# Patient Record
Sex: Female | Born: 1950
Health system: Southern US, Community
[De-identification: ages and names within clinical notes are randomized; demographics above are authoritative.]

## PROBLEM LIST (undated history)

## (undated) DIAGNOSIS — N3010 Interstitial cystitis (chronic) without hematuria: Secondary | ICD-10-CM

## (undated) DIAGNOSIS — F329 Major depressive disorder, single episode, unspecified: Secondary | ICD-10-CM

## (undated) DIAGNOSIS — R0602 Shortness of breath: Secondary | ICD-10-CM

## (undated) DIAGNOSIS — T8859XA Other complications of anesthesia, initial encounter: Secondary | ICD-10-CM

## (undated) DIAGNOSIS — H538 Other visual disturbances: Secondary | ICD-10-CM

## (undated) DIAGNOSIS — E7800 Pure hypercholesterolemia, unspecified: Secondary | ICD-10-CM

## (undated) DIAGNOSIS — I10 Essential (primary) hypertension: Secondary | ICD-10-CM

## (undated) DIAGNOSIS — M797 Fibromyalgia: Secondary | ICD-10-CM

## (undated) DIAGNOSIS — R42 Dizziness and giddiness: Secondary | ICD-10-CM

## (undated) DIAGNOSIS — F419 Anxiety disorder, unspecified: Secondary | ICD-10-CM

## (undated) DIAGNOSIS — M1990 Unspecified osteoarthritis, unspecified site: Secondary | ICD-10-CM

## (undated) DIAGNOSIS — N390 Urinary tract infection, site not specified: Secondary | ICD-10-CM

## (undated) DIAGNOSIS — G5793 Unspecified mononeuropathy of bilateral lower limbs: Secondary | ICD-10-CM

## (undated) DIAGNOSIS — J189 Pneumonia, unspecified organism: Secondary | ICD-10-CM

## (undated) DIAGNOSIS — G4730 Sleep apnea, unspecified: Secondary | ICD-10-CM

## (undated) DIAGNOSIS — H269 Unspecified cataract: Secondary | ICD-10-CM

## (undated) DIAGNOSIS — Z973 Presence of spectacles and contact lenses: Secondary | ICD-10-CM

## (undated) DIAGNOSIS — M549 Dorsalgia, unspecified: Secondary | ICD-10-CM

## (undated) DIAGNOSIS — M109 Gout, unspecified: Secondary | ICD-10-CM

## (undated) DIAGNOSIS — N399 Disorder of urinary system, unspecified: Secondary | ICD-10-CM

## (undated) DIAGNOSIS — J302 Other seasonal allergic rhinitis: Secondary | ICD-10-CM

## (undated) DIAGNOSIS — F32A Depression, unspecified: Secondary | ICD-10-CM

## (undated) DIAGNOSIS — H9190 Unspecified hearing loss, unspecified ear: Secondary | ICD-10-CM

## (undated) DIAGNOSIS — G629 Polyneuropathy, unspecified: Secondary | ICD-10-CM

## (undated) DIAGNOSIS — B379 Candidiasis, unspecified: Secondary | ICD-10-CM

## (undated) DIAGNOSIS — K219 Gastro-esophageal reflux disease without esophagitis: Secondary | ICD-10-CM

## (undated) HISTORY — PX: LASIK: SHX215

## (undated) HISTORY — PX: OTHER SURGICAL HISTORY: SHX169

## (undated) HISTORY — PX: UPPER GI ENDOSCOPY: SHX6162

## (undated) HISTORY — DX: Sleep apnea, unspecified: G47.30

## (undated) HISTORY — PX: TOE SURGERY: SHX1073

## (undated) HISTORY — PX: EYE SURGERY: SHX253

---

## 1995-01-14 HISTORY — PX: ABDOMINAL HYSTERECTOMY: SHX81

## 2002-09-26 ENCOUNTER — Ambulatory Visit (HOSPITAL_BASED_OUTPATIENT_CLINIC_OR_DEPARTMENT_OTHER): Admission: RE | Admit: 2002-09-26 | Discharge: 2002-09-26 | Payer: Self-pay | Admitting: Urology

## 2002-09-26 ENCOUNTER — Encounter (INDEPENDENT_AMBULATORY_CARE_PROVIDER_SITE_OTHER): Payer: Self-pay | Admitting: Specialist

## 2002-09-26 ENCOUNTER — Ambulatory Visit (HOSPITAL_COMMUNITY): Admission: RE | Admit: 2002-09-26 | Discharge: 2002-09-26 | Payer: Self-pay | Admitting: Urology

## 2003-03-13 ENCOUNTER — Observation Stay (HOSPITAL_COMMUNITY): Admission: RE | Admit: 2003-03-13 | Discharge: 2003-03-14 | Payer: Self-pay | Admitting: Urology

## 2006-04-24 ENCOUNTER — Encounter: Admission: RE | Admit: 2006-04-24 | Discharge: 2006-04-24 | Payer: Self-pay | Admitting: Family Medicine

## 2006-04-24 ENCOUNTER — Encounter (INDEPENDENT_AMBULATORY_CARE_PROVIDER_SITE_OTHER): Payer: Self-pay | Admitting: *Deleted

## 2007-05-14 ENCOUNTER — Encounter: Admission: RE | Admit: 2007-05-14 | Discharge: 2007-05-14 | Payer: Self-pay | Admitting: Family Medicine

## 2008-01-11 ENCOUNTER — Emergency Department (HOSPITAL_COMMUNITY): Admission: EM | Admit: 2008-01-11 | Discharge: 2008-01-11 | Payer: Self-pay | Admitting: Emergency Medicine

## 2008-07-10 ENCOUNTER — Ambulatory Visit (HOSPITAL_COMMUNITY): Admission: RE | Admit: 2008-07-10 | Discharge: 2008-07-10 | Payer: Self-pay | Admitting: Family Medicine

## 2008-11-28 ENCOUNTER — Emergency Department (HOSPITAL_COMMUNITY): Admission: EM | Admit: 2008-11-28 | Discharge: 2008-11-28 | Payer: Self-pay | Admitting: Emergency Medicine

## 2009-06-15 ENCOUNTER — Emergency Department (HOSPITAL_COMMUNITY): Admission: EM | Admit: 2009-06-15 | Discharge: 2009-06-15 | Payer: Self-pay | Admitting: Emergency Medicine

## 2009-06-16 ENCOUNTER — Ambulatory Visit (HOSPITAL_COMMUNITY): Admission: RE | Admit: 2009-06-16 | Discharge: 2009-06-16 | Payer: Self-pay | Admitting: Emergency Medicine

## 2009-07-12 ENCOUNTER — Ambulatory Visit (HOSPITAL_COMMUNITY): Admission: RE | Admit: 2009-07-12 | Discharge: 2009-07-12 | Payer: Self-pay | Admitting: Family Medicine

## 2009-09-06 ENCOUNTER — Emergency Department (HOSPITAL_COMMUNITY): Admission: EM | Admit: 2009-09-06 | Discharge: 2009-09-06 | Payer: Self-pay | Admitting: Emergency Medicine

## 2010-01-28 ENCOUNTER — Emergency Department (HOSPITAL_COMMUNITY)
Admission: EM | Admit: 2010-01-28 | Discharge: 2010-01-28 | Payer: Self-pay | Source: Home / Self Care | Admitting: Emergency Medicine

## 2010-01-30 LAB — URINALYSIS, ROUTINE W REFLEX MICROSCOPIC
Bilirubin Urine: NEGATIVE
Hgb urine dipstick: NEGATIVE
Ketones, ur: NEGATIVE mg/dL
Nitrite: NEGATIVE
Protein, ur: NEGATIVE mg/dL
Specific Gravity, Urine: 1.01 (ref 1.005–1.030)
Urine Glucose, Fasting: NEGATIVE mg/dL
Urobilinogen, UA: 0.2 mg/dL (ref 0.0–1.0)
pH: 6 (ref 5.0–8.0)

## 2010-02-03 ENCOUNTER — Encounter: Payer: Self-pay | Admitting: Family Medicine

## 2010-03-05 ENCOUNTER — Observation Stay (HOSPITAL_COMMUNITY)
Admission: EM | Admit: 2010-03-05 | Discharge: 2010-03-08 | Disposition: A | Discharge status: Home / Self Care | Payer: Medicaid Other | Attending: Family Medicine | Admitting: Family Medicine

## 2010-03-05 ENCOUNTER — Encounter (HOSPITAL_COMMUNITY): Payer: Self-pay | Admitting: Radiology

## 2010-03-05 ENCOUNTER — Emergency Department (HOSPITAL_COMMUNITY): Payer: Medicaid Other

## 2010-03-05 DIAGNOSIS — G8929 Other chronic pain: Secondary | ICD-10-CM | POA: Insufficient documentation

## 2010-03-05 DIAGNOSIS — M549 Dorsalgia, unspecified: Secondary | ICD-10-CM | POA: Insufficient documentation

## 2010-03-05 DIAGNOSIS — IMO0001 Reserved for inherently not codable concepts without codable children: Secondary | ICD-10-CM | POA: Insufficient documentation

## 2010-03-05 DIAGNOSIS — N3010 Interstitial cystitis (chronic) without hematuria: Secondary | ICD-10-CM | POA: Insufficient documentation

## 2010-03-05 DIAGNOSIS — I10 Essential (primary) hypertension: Secondary | ICD-10-CM | POA: Insufficient documentation

## 2010-03-05 DIAGNOSIS — Z79899 Other long term (current) drug therapy: Secondary | ICD-10-CM | POA: Insufficient documentation

## 2010-03-05 DIAGNOSIS — R0789 Other chest pain: Principal | ICD-10-CM | POA: Insufficient documentation

## 2010-03-05 HISTORY — DX: Essential (primary) hypertension: I10

## 2010-03-05 LAB — POCT CARDIAC MARKERS
CKMB, poc: 1.1 ng/mL (ref 1.0–8.0)
Myoglobin, poc: 86 ng/mL (ref 12–200)
Troponin i, poc: <0.05 ng/mL (ref 0.00–0.09)

## 2010-03-05 LAB — CK TOTAL AND CKMB (NOT AT ARMC)
CK, MB: 1.9 ng/mL (ref 0.3–4.0)
Relative Index: 1.1 (ref 0.0–2.5)
Total CK: 167 U/L (ref 7–177)

## 2010-03-05 LAB — BASIC METABOLIC PANEL
BUN: 10 mg/dL (ref 6–23)
CO2: 30 mEq/L (ref 19–32)
Calcium: 9.5 mg/dL (ref 8.4–10.5)
Chloride: 98 mEq/L (ref 96–112)
Creatinine, Ser: 0.76 mg/dL (ref 0.4–1.2)
GFR calc Af Amer: >60 mL/min (ref >60)
GFR calc non Af Amer: >60 mL/min (ref >60)
Glucose, Bld: 87 mg/dL (ref 70–99)
Potassium: 3.9 mEq/L (ref 3.5–5.1)
Sodium: 139 mEq/L (ref 135–145)

## 2010-03-05 LAB — D-DIMER, QUANTITATIVE: D-Dimer, Quant: 0.22 ug/mL-FEU (ref 0.00–0.48)

## 2010-03-05 LAB — DIFFERENTIAL
Basophils Absolute: 0.1 10*3/uL (ref 0.0–0.1)
Basophils Relative: 1 % (ref 0–1)
Eosinophils Absolute: 0.2 10*3/uL (ref 0.0–0.7)
Eosinophils Relative: 4 % (ref 0–5)
Lymphocytes Relative: 34 % (ref 12–46)
Lymphs Abs: 2.1 10*3/uL (ref 0.7–4.0)
Monocytes Absolute: 0.6 10*3/uL (ref 0.1–1.0)
Monocytes Relative: 10 % (ref 3–12)
Neutro Abs: 3.3 10*3/uL (ref 1.7–7.7)
Neutrophils Relative %: 52 % (ref 43–77)

## 2010-03-05 LAB — PROTIME-INR
INR: 0.95 (ref 0.00–1.49)
Prothrombin Time: 12.9 seconds (ref 11.6–15.2)

## 2010-03-05 LAB — CBC
HCT: 39.4 % (ref 36.0–46.0)
Hemoglobin: 13.3 g/dL (ref 12.0–15.0)
MCH: 30.4 pg (ref 26.0–34.0)
MCHC: 33.8 g/dL (ref 30.0–36.0)
MCV: 90.2 fL (ref 78.0–100.0)
Platelets: 227 10*3/uL (ref 150–400)
RBC: 4.37 MIL/uL (ref 3.87–5.11)
RDW: 13.6 % (ref 11.5–15.5)
WBC: 6.3 10*3/uL (ref 4.0–10.5)

## 2010-03-05 LAB — CARDIAC PANEL(CRET KIN+CKTOT+MB+TROPI): Relative Index: 0.9 (ref 0.0–2.5)

## 2010-03-05 LAB — APTT: aPTT: 28 seconds (ref 24–37)

## 2010-03-05 NOTE — H&P (Signed)
NAMELIBNI, FUSARO            ACCOUNT NO.:  [id number]  MEDICAL RECORD NO.:  [id number]           PATIENT TYPE:  I  LOCATION:  A317                          FACILITY:  [facility]  PHYSICIAN:  Andreas Blower, MD       DATE OF BIRTH:  1950-09-10  DATE OF ADMISSION:  03/05/2010 DATE OF DISCHARGE:  LH                             HISTORY & PHYSICAL   PRIMARY CARE PHYSICIAN:  [facility].  The patient has seen Dr. Selinda Flavin at [facility] in [place] in the past.  CHIEF COMPLAINT:  Chest pain.  HISTORY OF PRESENT ILLNESS:  Ms. Darely is a 60 year old Caucasian female with history of hypertension, chronic back pain, fibromyalgia, interstitial cystitis who presents with the above complaint.  She reports that she has been having chest pain on and off over the last month.  She reports that she has been having it every 2 to 3 days. Yesterday, she felt like she was having initially some jaw pain and then heavy pressure-like sensation in her chest.  That lasted for 15 minutes and then resolved.  She has had prior episodes at a mall and has had multiple episodes at home.  As a result, she presented to the ER for further evaluation.  She denies any recent fevers or chills.  Denies any nausea or vomiting.  Does report that she has some shortness of breath at baseline, but is chronic in nature.  Denies any abdominal pain or diarrhea.  Denies any headaches or vision changes.  She does not report anything that triggers these episodes and nothing makes the chest pain better except may be drinking glass of water.  The patient reports that she has gained about 10-20 pounds in the last 3 months or so.  The patient does report that she has been feeling weak and fatigue in the last month.  REVIEW OF SYSTEMS:  All systems were reviewed with the patient was positive as per HPI, otherwise all other systems are negative.  PAST MEDICAL HISTORY: 1. Hypertension. 2. History of chronic back  pain. 3. Fibromyalgia. 4. History of cholelithiasis. 5. History of interstitial cystitis. 6. History of hysterectomy.  The patient was recently treated for pneumonia in January 2012.  SOCIAL HISTORY:  The patient does not smoke, does not use any illegal drugs.  Does drink one glass of wine approximately every 3 months.  FAMILY HISTORY:  Significant for mother having hypertension.  Has had heart problems.  She is overweight and has diabetes.  Father has had a history of throat cancer and was a smoker and drank alcohol.  Had hypertension.  HOME MEDICATIONS:  Doses need to be accurately rectum reconciled however, the patient is on: 1. Xanax. 2. Clonidine 0.1 mg p.o. twice daily. 3. Toprol extended release 100 mg p.o. daily. 4. Potassium chloride extended release 21 mEq p.o. daily. 5. Clarinex 5 mg p.o. daily. 6. Lisinopril/hydrochlorothiazide 20/12.5 mg p.o. twice daily. 7. Neurontin 400 mg in the morning and 800 mg at night. 8. Cymbalta 60 mg p.o. daily. 9. Flexeril 1 tablet p.o. daily.  PHYSICAL EXAMINATION:  VITAL SIGNS:  Temperature is 99, blood pressure  is 143/66, heart rate 70, respiration 18, satting at 97% on 2 L of oxygen. GENERAL:  The patient was alert and oriented, not appeared to be any acute distress, was lying in bed comfortably. HEENT:  Extraocular muscles are intact.  Pupils equal and round.  Had moist mucous membranes. NECK:  Supple. HEART:  Regular with S1, S2. LUNGS:  Clear to auscultation bilaterally. ABDOMEN:  Soft, nontender, nondistended.  Positive bowel sounds. EXTREMITIES:  The patient with good peripheral pulses with trace edema. NEURO:  Cranial nerves II through XII grossly intact.  Had 5/5 motor strength in upper as well as lower extremities.  RADIOLOGY/IMAGING:  The patient had a portable chest x-ray, which shows no active cardiopulmonary disease.  LABORATORY DATA:  CBC shows white count of 6.3, hemoglobin 13.3, hematocrit 39.4, platelet count  227.  INR 0.95.  D-dimer 0.22. Electrolytes normal with a creatinine of 0.74.  Initial troponin was less than 0.01.  ASSESSMENT AND PLAN: 1. Chest pain.  We will admit the patient and rule her out for     myocardial infarction.  D-dimer is normal indicating low likelihood     for thromboembolic event.  Given that the patient is complaining     about jaw pain and a pressure-like sensation in her chest, given     that the patient's only risk factors are hypertension, we will get a     cardiologist to evaluate her further management per cardiology     after the patient is ruled out for acute coronary syndrome.  The     patient may have pain from gastroesophageal reflux disease     like symptoms as a result, we will start the patient on     pantoprazole at the time of discharge, consider transitioning her     to omeprazole and see if her pain improves on a trial of PPI.     If ruled out for acute coronary syndrome could consider outpatient     GI evaluation for consideration of EGD. 2. Hypertension, not well-controlled at this time.  The patient     however did not take her morning medications.  We will continue her     home medications with p.r.n. hydralazine for blood pressure.     Further titration of her antihypertensives based on how the blood     pressure is during the course of hospital stay and further     titration as an outpatient. 3. Fatigue.  Most likely secondary to fibromyalgia.  We will check     TSH. 4. Fibromyalgia.  Continue home medications. 5. Chronic back pain.  Not an active issue at this time. 6. Prophylaxis.  On Lovenox due to prophylaxis.  DISPOSITION:  The patient admitted under observation.  If the patient is ruled out for acute coronary syndrom and after cardiology evaluation to consider discharging the patient tomorrow.  Time spent on admission, talking to the patient, and coordinating care was 45 minutes.   Andreas Blower, MD   SR/MEDQ  D:  03/05/2010   T:  03/05/2010  Job:  [id number]  Electronically Signed by Wardell Heath Tori Dattilio  on 03/05/2010 03:29:16 PM

## 2010-03-06 DIAGNOSIS — R079 Chest pain, unspecified: Secondary | ICD-10-CM

## 2010-03-06 LAB — CBC
HCT: 36.3 % (ref 36.0–46.0)
MCV: 90.1 fL (ref 78.0–100.0)
RBC: 4.03 MIL/uL (ref 3.87–5.11)
RDW: 13.4 % (ref 11.5–15.5)
WBC: 4.8 10*3/uL (ref 4.0–10.5)

## 2010-03-06 LAB — CARDIAC PANEL(CRET KIN+CKTOT+MB+TROPI)
CK, MB: 1.6 ng/mL (ref 0.3–4.0)
Relative Index: 1.1 (ref 0.0–2.5)
Relative Index: 0.9 (ref 0.0–2.5)
Troponin I: 0.03 ng/mL (ref 0.00–0.06)
Troponin I: 0.02 ng/mL (ref 0.00–0.06)

## 2010-03-06 LAB — BASIC METABOLIC PANEL
BUN: 8 mg/dL (ref 6–23)
Chloride: 104 mEq/L (ref 96–112)
GFR calc non Af Amer: >60 mL/min (ref >60)
Glucose, Bld: 90 mg/dL (ref 70–99)
Potassium: 3.6 mEq/L (ref 3.5–5.1)
Sodium: 142 mEq/L (ref 135–145)

## 2010-03-06 LAB — LIPID PANEL
Cholesterol: 214 mg/dL — ABNORMAL HIGH (ref 0–200)
HDL: 44 mg/dL (ref >39)
LDL Cholesterol: 137 mg/dL — ABNORMAL HIGH (ref 0–99)
Total CHOL/HDL Ratio: 4.9 RATIO
VLDL: 33 mg/dL (ref 0–40)

## 2010-03-06 NOTE — Consult Note (Signed)
[name], [name]            ACCOUNT NO.:  [id number]  MEDICAL RECORD NO.:  [id number]           PATIENT TYPE:  I  LOCATION:  A317                          FACILITY:  [facility]  PHYSICIAN:  Jonelle Sidle, MD DATE OF BIRTH:  06-02-1950  DATE OF CONSULTATION: DATE OF DISCHARGE:                                CONSULTATION   ADDENDUM.  REQUESTING SERVICE:  [facility] hospitalist team.  Please refer to the dictated cardiology consultation done by Ms. Joni Reining, nurse practitioner.  SUMMARY:  Ms. [name] is a 60 year old woman with history of fibromyalgia, hypertension, chronic back pain, and interstitial cystitis.  She reports no known history of hyperlipidemia, diabetes mellitus, or cardiovascular disease.  She indicates at least 76-month history of exertional chest pain and shortness of breath.  These episodes do not occur on a daily basis.  She indicates class II to III dyspnea on exertion, particularly when going up hill  She is experiencing these symptoms when shopping a store, walking with her 80 year old mother, and actually having to stop and sit down.  At the present time she has no health insurance, and called the [facility] Department to be seen, referred to the hospital for further assessment.  PHYSICAL EXAMINATION:  GENERAL:  On my examination the patient is comfortable without active chest pain. VITAL SIGNS:  Temperature 97.5 degrees, heart rate of 66 in sinus rhythm, respirations 16, blood pressure is 132/76, ox saturation is 97% on room air.  Weight is 92 kg.  This is an obese woman in no acute distress. HEENT:  Conjunctiva and lids normal.  Oropharynx is clear. NECK:  Supple.  No elevated JVD, bruits, or thyromegaly is noted. LUNGS:  Clear to auscultation without labored breathing. CARDIAC:  Regular rate and rhythm.  No S3, gallop, or pericardial rub. ABDOMEN:  Soft, nontender.  Bowel sounds present. EXTREMITIES:  Exhibit no significant  pitting edema.  She does have some back pain to palpation of the lumbar area. SKIN:  Warm and dry. MUSCULOSKELETAL:  No kyphosis is noted. NEUROPSYCHIATRIC:  The patient is alert and oriented x3.  Affect is somewhat flat.  LABORATORY DATA:  WBCs 4.8, hemoglobin 12.4, platelets 210.  Sodium 142, potassium 3.6, BUN 8, creatinine 0.76, troponin-I levels are normal x4 with peak being 0.03, normal CK-MB relative indices.  TSH is 4.1.  A 12-lead electrocardiogram from February 21 shows normal sinus rhythm with decreased anterior R-wave progression, no Q-waves, however.  Chest x-ray from February 24 shows no active cardiopulmonary disease process.  IMPRESSION: 1. Exertional chest pain, shortness of breath, description concerning     for angina.  Cardiac markers and ECG are against an acute coronary     syndrome, however.  Active cardiac risk factors include obesity and     hypertension.  Lipid status is uncertain at this point. 2. History of fibromyalgia, also chronic back pain.  RECOMMENDATIONS:  The patient is scheduled for a 2-D echocardiogram, currently pending, and also a Lexiscan Myoview study as an inpatient for tomorrow.  Otherwise medical therapy can be titrated for better blood pressure control and general risk factor modification with follow up lipid profile.  If there is evidence of potential ischemic heart disease based on objective testing would suggest pursuing further with cardiac catheterization.  Our service will follow with you.     Jonelle Sidle, MD     SGM/MEDQ  D:  03/06/2010  T:  03/06/2010  Job:  [id number]  Electronically Signed by Nona Dell MD on 03/06/2010 08:13:34 PM

## 2010-03-07 ENCOUNTER — Observation Stay (HOSPITAL_COMMUNITY): Payer: Medicaid Other

## 2010-03-07 ENCOUNTER — Observation Stay (HOSPITAL_COMMUNITY): Payer: Self-pay

## 2010-03-07 ENCOUNTER — Encounter (HOSPITAL_COMMUNITY): Payer: Self-pay

## 2010-03-07 DIAGNOSIS — R072 Precordial pain: Secondary | ICD-10-CM

## 2010-03-07 LAB — HEPATIC FUNCTION PANEL
Bilirubin, Direct: 0.1 mg/dL (ref 0.0–0.3)
Indirect Bilirubin: 0.4 mg/dL (ref 0.3–0.9)
Total Bilirubin: 0.5 mg/dL (ref 0.3–1.2)

## 2010-03-07 MED ORDER — TECHNETIUM TC 99M TETROFOSMIN IV KIT
10.0000 | PACK | Freq: Once | INTRAVENOUS | Status: AC | PRN
  Administered 2010-03-07: 9.5 via INTRAVENOUS

## 2010-03-07 MED ORDER — TECHNETIUM TC 99M TETROFOSMIN IV KIT
30.0000 | PACK | Freq: Once | INTRAVENOUS | Status: AC | PRN
  Administered 2010-03-07: 28 via INTRAVENOUS

## 2010-03-08 ENCOUNTER — Encounter (HOSPITAL_COMMUNITY): Payer: Self-pay | Attending: Family Medicine

## 2010-03-08 ENCOUNTER — Encounter (HOSPITAL_COMMUNITY): Payer: Self-pay

## 2010-03-09 NOTE — Consult Note (Signed)
[name], [name]            ACCOUNT NO.:  [id number]  MEDICAL RECORD NO.:  [id number]           PATIENT TYPE:  I  LOCATION:  A317                          FACILITY:  [facility]  PHYSICIAN:  Jonelle Sidle, MD DATE OF BIRTH:  62/19/1952  DATE OF CONSULTATION:  03/06/2010 DATE OF DISCHARGE:                                CONSULTATION   PRIMARY CARDIOLOGIST:  Jonelle Sidle, MD  PRIMARY CARE PHYSICIAN:  [facility] Department.  REASON FOR CONSULTATION:  Chest pain, rule out MI.  HISTORY OF PRESENT ILLNESS:  This 60 year old Caucasian female with history of hypertension, chronic back pain, fibromyalgia with complaints of chest pain and heaviness with occasional knife-like pain.  The patient states she drinks a lot of water for relief, lasting 10-15 minutes, radiation to the jaw on occasion.  She also takes Xanax at some times to help alleviate.  She does not have insurance and so she avoids seeing MDs.  During her symptoms recently, [facility] and was referred to the emergency room.  The pain is not prompted or alleviated by anything.  She just states that she drinks a lot of water and takes a Xanax.  This can occur with emotional stress, but not always.  The pain is sometimes associated with dyspnea, but not at all times.  However, she has been having some dyspnea climbing stairs at home and most recently has had a clammy feeling associated with this. The patient is obese and she felt this is related to her weight. Recently, she has been treated for pneumonia in January.  The patient has been admitted to rule out myocardial infarction and we are asked to evaluate further.  REVIEW OF SYSTEMS:  Positive for chest pain, shortness of breath, dyspnea on exertion, some radiation to the jaw, urinary frequency and urgency and arthralgia.  All other systems are reviewed and are found to be negative.  CODE STATUS:  Full.  PAST MEDICAL  HISTORY:  Hypertension, chronic back pain, fibromyalgia and interstitial cystitis.  PAST SURGICAL HISTORY:  Hysterectomy, bladder sling, Lasik surgery of her eyes and bone spur removal from her big toe on the right.  SOCIAL HISTORY:  She lives in [place] with her husband.  She is married.  She does not smoke, does not drink, does not use drugs.  FAMILY HISTORY:  Mother with hypertension and diabetes.  Father with throat cancer and hypertension.  CURRENT MEDICATIONS PRIOR TO ADMISSION: 1. Xanax 0.25 p.r.n. 2. Clonidine 0.1 mg b.i.d. 3. Toprol-XL 100 mg daily. 4. Potassium 21 mEq daily. 5. Clarinex 5 mg day. 6. Lisinopril/hydrochlorothiazide 20/12.5 mg daily. 7. Neurontin 400 mg in the a.m., 800 mg in the p.m. 8. Cymbalta 60 mg daily. 9. Flexeril 10 mg daily.  ALLERGIES:  No known drug allergies.  CURRENT LABORATORY DATA:  Troponin 0.01, 0.02, 0.03.  Sodium 139, potassium 3.9, chloride 98, CO2 30, BUN 10, creatinine 0.76, glucose 87, hemoglobin 13.3, hematocrit 39.4, white blood cells 6.3, platelets 127. EKG revealing normal sinus rhythm with a rate of 69 beats per minute with a questionable Q-wave in lead II.  Chest x-ray revealing no  active cardiopulmonary disease.  PHYSICAL EXAMINATION:  VITAL SIGNS:  Blood pressure 115/64, pulse 62, respirations 19, temperature 97.2, O2 sat 96% on room air, weight 92.6 kg.  GENERAL:  She is awake, alert and oriented without acute distress. HEENT:  Head is normocephalic and atraumatic.  Eyes, PERRLA. NECK:  Supple with no carotid bruits.  No JVD. CARDIOVASCULAR:  Regular rate and rhythm without murmurs, rubs or gallops.  Pulses are 2+ and equal without bruit. LUNGS:  She does have some left base crackles with pain with inspiration on occasion.  There is no prolongation of her expiratory phase. ABDOMEN:  Soft, nontender, obese with 2+ bowel sounds. EXTREMITIES:  Without clubbing, cyanosis or edema. MUSCULOSKELETAL:  She does have chronic  back tenderness and CVA tenderness on palpation. NEURO:  Cranial nerves II through XII are grossly intact. PSYCH:  She is depressed and tearful.  IMPRESSION: 1. Chest pain having intermittent with radiation to the jaw on     dyspnea.  Also, noticed dyspnea on exertion and cannot climb up     stairs in the house without stopping.  She attributed this to her     weight.  D-dimer is negative.  EKG is normal with a questionable Q-     wave inferiorly.  Cardiac enzymes are negative.  Echocardiogram is     ordered for LV systolic function.  I recommend Lexiscan Myoview for     assessment of ischemia.  Pain is worrisome for angina, but     fibromyalgia and chronic back pain the issue.  We will make further     recommendations after stress Myoview is completed. 2. Hypertension, currently well controlled.  Echocardiogram will     evaluate for diastolic dysfunction. 3. Questionable hypercholesterolemia.  We will check fasting lipids     and LFTs for risk stratification making further recommendations     throughout hospital course.  Please see Dr. Doreatha Martin Dontavius Keim's addendum which he is dictating in addition to this consultation for further recommendations and his thoughts concerning this patient.  On behalf of the physicians and providers of [facility], we would like to thank the [facility] Service for allowing [place] to participate in the care of this patient.     Bettey Mare. Lyman Bishop, NP   ______________________________ Jonelle Sidle, MD    KML/MEDQ  D:  03/06/2010  T:  03/07/2010  Job:  [id number]  cc:   Jonelle Sidle, MD [address] [place], [place] [address]  Electronically Signed by Joni Reining NP on 62/23/2012 02:00:59 PM Electronically Signed by Nona Dell MD on 03/09/2010 08:13:32 PM

## 2010-03-09 NOTE — Discharge Summary (Signed)
  [name], HAMMER            ACCOUNT NO.:  [id number]  MEDICAL RECORD NO.:  [id number]           PATIENT TYPE:  I  LOCATION:  A317                          FACILITY:  [facility]  PHYSICIAN:  Tarry Kos, MD       DATE OF BIRTH:  05-18-1950  DATE OF ADMISSION:  03/05/2010 DATE OF DISCHARGE:  02/24/2012LH                              DISCHARGE SUMMARY   ADDENDUM  Ms. Algeo's discharge yesterday afternoon was delayed.  Her stress test was normal.  Her discharge was delayed due to the nursing staff not being able to printout her med rec form, so she was kept overnight. This morning, the nursing staff printed out her med rec discharge reconciliation form at which point she was discharged.                                           ______________________________ Tarry Kos, MD     RD/MEDQ  D:  03/08/2010  T:  03/09/2010  Job:  [id number]  Electronically Signed by Eldridge Dace MD on 03/09/2010 01:57:28 PM

## 2010-03-09 NOTE — Discharge Summary (Signed)
  NAMELEAHA, CUERVO            ACCOUNT NO.:  [id number]  MEDICAL RECORD NO.:  [id number]           PATIENT TYPE:  I  LOCATION:  A317                          FACILITY:  [facility]  PHYSICIAN:  Tarry Kos, MD       DATE OF BIRTH:  1950/01/26  DATE OF ADMISSION:  03/05/2010 DATE OF DISCHARGE:  02/24/2012LH                              DISCHARGE SUMMARY   DISCHARGE DIAGNOSES: 1. Atypical chest pain. 2. Hypertension. 3. History of fibromyalgia. 4. Chronic back pain.  SUMMARY OF HOSPITAL COURSE:  Ms. Jolisa is a 60 year old female who presented to the emergency room with atypical chest pain.  She had serial cardiac enzymes which were all negative.  She has a 12-lead EKG which did not show any acute changes.  She is scheduled for stress test today on March 07, 2010.  If this is normal, she will be discharged home and will follow up with primary care physician in 1 week. Cardiology was also consulted during this hospitalization.  Please see their dictation for full details of their recommendations.  Again, if her stress test is normal, she will be discharged home later in the day.  PHYSICAL EXAMINATION:  VITAL SIGNS:  She has been afebrile.  Her vital signs have been stable. GENERAL:  She had been alert and oriented x4.  No apparent distress, cooperative and friendly. COR:  Regular rate and rhythm without murmurs, rubs or gallops. CHEST:  Clear to auscultation.  No wheezing, rhonchi, or rales. ABDOMEN:  Soft, nontender, and nondistended.  Positive bowel sounds.  No hepatosplenomegaly. EXTREMITIES:  No clubbing, cyanosis, or edema. PSYCH:  Normal mood and affect. NEURO:  No focal neurological deficits. SKIN:  No rashes.  She is being discharged home to continue her home medication regimen without any changes: 1. Aspirin 325 mg a day. 2. Quinapril/HCTZ 20/12.5 mg twice a day. 3. Clarinex 5 mg daily. 4. Toprol-XL 100 mg daily. 5. Cymbalta 60 mg daily. 6. Klonopin 0.1 mg twice  a day. 7. KCl 20 mEq daily. 8. Neurontin 400 mg 4 times a day.  The patient is being discharged if her stress test was negative.  Follow up with primary care physician in 1 week.                                           ______________________________ Tarry Kos, MD     RD/MEDQ  D:  03/08/2010  T:  03/09/2010  Job:  [id number]  Electronically Signed by Eldridge Dace MD on 03/09/2010 01:57:32 PM

## 2010-04-17 LAB — CBC
HCT: 41 % (ref 36.0–46.0)
Hemoglobin: 14 g/dL (ref 12.0–15.0)
RBC: 4.47 MIL/uL (ref 3.87–5.11)
RDW: 12.9 % (ref 11.5–15.5)
WBC: 6.4 10*3/uL (ref 4.0–10.5)

## 2010-04-17 LAB — DIFFERENTIAL
Basophils Absolute: 0 10*3/uL (ref 0.0–0.1)
Lymphocytes Relative: 27 % (ref 12–46)
Lymphs Abs: 1.7 10*3/uL (ref 0.7–4.0)
Monocytes Absolute: 0.4 10*3/uL (ref 0.1–1.0)
Monocytes Relative: 7 % (ref 3–12)
Neutro Abs: 4.1 10*3/uL (ref 1.7–7.7)

## 2010-04-17 LAB — BASIC METABOLIC PANEL
Calcium: 9.3 mg/dL (ref 8.4–10.5)
GFR calc Af Amer: >60 mL/min (ref >60)
GFR calc non Af Amer: >60 mL/min (ref >60)
Potassium: 3.6 mEq/L (ref 3.5–5.1)
Sodium: 134 mEq/L — ABNORMAL LOW (ref 135–145)

## 2010-04-17 LAB — POCT CARDIAC MARKERS
CKMB, poc: 1.3 ng/mL (ref 1.0–8.0)
Troponin i, poc: <0.05 ng/mL (ref 0.00–0.09)

## 2010-05-31 NOTE — Op Note (Signed)
NAME:  NAEEMAH, JASMER                   ACCOUNT NO.:  [id number]   MEDICAL RECORD NO.:  [id number]                   PATIENT TYPE:  AMB   LOCATION:  DAY                                  [facility]ACILITY[facility]  [facility]   PHYSICIAN:  Maretta Bees. Vonita Moss, M.D.             DATE OF BIRTH:  [birth year]   DATE OF PROCEDURE:  03/13/2003  DATE OF DISCHARGE:                                 OPERATIVE REPORT   PREOPERATIVE DIAGNOSIS:  Cystocele and stress incontinence.   POSTOPERATIVE DIAGNOSIS:  Cystocele and stress incontinence.   OPERATION/PROCEDURE:  Tape insertion.   SURGEON:  Maretta Bees. Vonita Moss, M.D.   ASSISTANT:  Veverly Fells. Vernie Ammons, M.D.   ANESTHESIA:  General.   INDICATIONS:  This is a 60 year old white female who has had history of  interstitial cystitis and has had previous AP repair and vaginal  hysterectomy.  She continues to have stress urinary incontinence and  cystocele and she is brought to the OR today for repair of those problems.   DESCRIPTION OF PROCEDURE:  The patient was brought to the operating room and  placed in the lithotomy position.  The external genitalia, suprapubic and  vaginal canal prepped and draped in the usual fashion.  Dr. Vernie Ammons  performed an anterior repair dictated separately.  At this point with the  endopelvic fascia dissected out bilaterally, short stab wounds were made  laterally at the level of the clitoris about 4-5 cm out on each side.  At  this point a curved insertion needle was placed around the pubis adjacent to  the medial aspect of the obturator fossa and the needle tracked back behind  the pubic ramus bone and then brought out lateral to the mid urethra.  The  __________ tape was then inserted in the end of the needle on the left side  and retracted at the skin level.  In like fashion, the left side had needle  placement around the pubis ramus, beginning on the medial aspect of the  obturator fossa on that side and brought out paraurethrally in the  mid  urethra level and tape put through the needle and brought laterally in  position so that a hemostat easily went between the urethra and the tape  without any tension on the tape.  We compressed the urethra.  She was then  cystoscoped and there was no evidence of any bladder or urethral perforation  or injury.  The wound was irrigated with antibiotic solution.  At this point  the vaginal incision was then closed with running 2-0 Vicryl.  A small area  of vaginal mucosa and the left side had a small buttonhole so that was  revised and repaired so that the __________ tape was completely submucosal  and again in good position.  The wound was again irrigated with antibiotic  solution and after complete closure of the vaginal mucosa, the vaginal canal  was packed with Premarin-impregnated vaginal pack and her Foley catheter was  connected to closed drainage.  She was taken to the recovery room in good  condition having tolerated the procedure well.  Sponge, needle and  instrument counts were correct.                                               Maretta Bees. Vonita Moss, M.D.   LJP/MEDQ  D:  03/13/2003  T:  03/13/2003  Job:  [id number]

## 2010-05-31 NOTE — Op Note (Signed)
   NAME:  [name], [name]                        ACCOUNT NO.:  [id number]   MEDICAL RECORD NO.:  [id number]                   PATIENT TYPE:  AMB   LOCATION:  NESC                                 [facility]ACILITY[facility]   PHYSICIAN:  Maretta Bees. Vonita Moss, M.D.             DATE OF BIRTH:  03-Jul-1950   DATE OF PROCEDURE:  09/26/2002  DATE OF DISCHARGE:                                 OPERATIVE REPORT   PREOPERATIVE DIAGNOSIS:  Rule out interstitial cystitis.   POSTOPERATIVE DIAGNOSIS:  Rule out interstitial cystitis.   OPERATION/PROCEDURE:  1. Cystoscopy.  2. Hydraulic dilation of bladder and cold cup bladder biopsy.   SURGEON:  Maretta Bees. Vonita Moss, M.D.   ANESTHESIA:  MAC.   INDICATIONS:  This is a 60 year old lady with a long history of frequency,  bladder discomfort, feeling of fullness in the bladder and stress  incontinence.  She has had previous anterior and posterior repair.  She does  have a persistent cystocele and urodynamic studies showed a very low  capacity bladder and she voided incontinently around the catheter at 313 mL  and she had a spontaneous and inhibited bladder contraction.  Her pelvic  pain and frequency score was very high at 28.   DESCRIPTION OF PROCEDURE:  The patient was brought to the operating room and  placed in the lithotomy position.  External genitalia are prepped and draped  in the usual fashion.  She was cystoscoped and the urethra was unremarkable.  The bladder had no stones, tumors or inflammatory lesions.  She was filled  to 500 mL at an elevation of 36 inches of water pressure.  It was obvious  she was uncomfortable with filling just to this level and she started  leaking around the cystoscope.  Looking back in she had typical scattered  submucosal petechiae and hemorrhage consistent with interstitial cystitis,  typical hemorrhagic areas in the bladder base and two areas were biopsied  with the cold cup bladder biopsy.  Biopsy sites were fulgurated  with the  Bugbee electrode.  At this point the bladder was emptied. There was good  hemostasis.  The ureteral orifices were intact and she was taken to the  recovery room in good condition.  A B&O suppository was inserted per rectum  and 30 mg of Toradol was given IV for postoperative discomfort.                                                Maretta Bees. Vonita Moss, M.D.    LJP/MEDQ  D:  09/26/2002  T:  09/26/2002  Job:  [id number]

## 2010-05-31 NOTE — Op Note (Signed)
NAME:  [name], [name]                   ACCOUNT NO.:  [id number]   MEDICAL RECORD NO.:  [id number]                   PATIENT TYPE:  AMB   LOCATION:  DAY                                  FACILITY:  [facility]   PHYSICIAN:  Mark C. Vernie Ammons, M.D.               DATE OF BIRTH:  1950/10/23   DATE OF PROCEDURE:  03/13/2003  DATE OF DISCHARGE:                                 OPERATIVE REPORT   PREOPERATIVE DIAGNOSIS:  Cystocele.   POSTOPERATIVE DIAGNOSIS:  Cystocele.   PROCEDURE:  Anterior repair.   SURGEON:  Mark C. Vernie Ammons, M.D.   ASSISTANT:  Maretta Bees. Vonita Moss, M.D.   ANESTHESIA:  General.   DRAINS:  16 French Foley catheter.   ESTIMATED BLOOD LOSS:  Approximately 25 mL.   SPECIMENS:  None.   COMPLICATIONS:  None.   INDICATIONS:  The patient is a 60 year old white female with a cystocele and  stress urinary incontinence.  She has had prior repair but has significant  incontinence with bulging of the bladder, the anterior wall of the vagina,  consistent with cystocele.  She is brought to the OR today for repair of her  cystocele and sling procedure by Dr. Vonita Moss.   DESCRIPTION OF OPERATION:  After informed consent, patient brought to the  major OR, placed on the table, and administered general anesthesia and then  moved to the dorsal lithotomy position.  Genitalia was sterilely prepped and  draped and a 16 French Foley catheter was placed in the bladder.  Lidocaine  1% with epinephrine was used to infiltrate the subvaginal mucosa and a  midline incision was made.  Allis clamps were placed on each edge and the  dissection proceeded laterally on both sides with some difficulty due to the  previous surgery there and scarring.  I was eventually successful at  dissecting the bladder from the vaginal mucosa and then closed the cystocele  defect with interrupted 2-0 Vicryl suture.  I then assisted Dr. Vonita Moss  with the sling procedure.  The patient tolerated the procedure well  with no  intraoperative complications.                                               Mark C. Vernie Ammons, M.D.    MCO/MEDQ  D:  03/13/2003  T:  03/13/2003  Job:  [id number]

## 2010-07-09 ENCOUNTER — Other Ambulatory Visit: Payer: Self-pay | Admitting: Obstetrics and Gynecology

## 2010-07-09 DIAGNOSIS — Z139 Encounter for screening, unspecified: Secondary | ICD-10-CM

## 2010-07-15 ENCOUNTER — Ambulatory Visit (HOSPITAL_COMMUNITY)
Admission: RE | Admit: 2010-07-15 | Discharge: 2010-07-15 | Disposition: A | Discharge status: Home / Self Care | Payer: Self-pay | Source: Ambulatory Visit | Attending: Obstetrics and Gynecology | Admitting: Obstetrics and Gynecology

## 2010-07-15 DIAGNOSIS — Z139 Encounter for screening, unspecified: Secondary | ICD-10-CM

## 2010-08-10 ENCOUNTER — Emergency Department: Payer: Self-pay | Admitting: Internal Medicine

## 2010-08-19 ENCOUNTER — Encounter (HOSPITAL_COMMUNITY): Payer: Self-pay | Admitting: *Deleted

## 2010-08-19 ENCOUNTER — Emergency Department (HOSPITAL_COMMUNITY): Payer: No Typology Code available for payment source

## 2010-08-19 ENCOUNTER — Emergency Department (HOSPITAL_COMMUNITY)
Admission: EM | Admit: 2010-08-19 | Discharge: 2010-08-19 | Disposition: A | Discharge status: Home / Self Care | Payer: No Typology Code available for payment source | Attending: Emergency Medicine | Admitting: Emergency Medicine

## 2010-08-19 DIAGNOSIS — R42 Dizziness and giddiness: Secondary | ICD-10-CM | POA: Insufficient documentation

## 2010-08-19 DIAGNOSIS — R51 Headache: Secondary | ICD-10-CM | POA: Insufficient documentation

## 2010-08-19 DIAGNOSIS — IMO0001 Reserved for inherently not codable concepts without codable children: Secondary | ICD-10-CM | POA: Insufficient documentation

## 2010-08-19 DIAGNOSIS — I10 Essential (primary) hypertension: Secondary | ICD-10-CM | POA: Insufficient documentation

## 2010-08-19 HISTORY — DX: Urinary tract infection, site not specified: N39.0

## 2010-08-19 HISTORY — DX: Fibromyalgia: M79.7

## 2010-08-19 HISTORY — DX: Interstitial cystitis (chronic) without hematuria: N30.10

## 2010-08-19 HISTORY — DX: Disorder of urinary system, unspecified: N39.9

## 2010-08-19 MED ORDER — TRAMADOL HCL 50 MG PO TABS: 50.0000 mg | ORAL_TABLET | Freq: Four times a day (QID) | ORAL | Status: AC | PRN

## 2010-08-19 MED ORDER — TRAMADOL HCL 50 MG PO TABS
50.0000 mg | ORAL_TABLET | Freq: Once | ORAL | Status: AC
  Administered 2010-08-19: 50 mg via ORAL
  Filled 2010-08-19: qty 1
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
## 2010-08-20 NOTE — ED Provider Notes (Signed)
Medical screening examination/treatment/procedure(s) were performed by non-physician practitioner and as supervising physician I was immediately available for consultation/collaboration.   Jasai Sorg, MD 08/20/10 0058 

## 2010-10-18 LAB — BASIC METABOLIC PANEL
BUN: 14 mg/dL (ref 6–23)
CO2: 28 mEq/L (ref 19–32)
Calcium: 9.4 mg/dL (ref 8.4–10.5)
Chloride: 103 mEq/L (ref 96–112)
Creatinine, Ser: 0.8 mg/dL (ref 0.4–1.2)
GFR calc Af Amer: >60 mL/min (ref >60)

## 2010-10-18 LAB — CBC
MCHC: 34 g/dL (ref 30.0–36.0)
MCV: 91.1 fL (ref 78.0–100.0)
Platelets: 210 10*3/uL (ref 150–400)
RBC: 4.03 MIL/uL (ref 3.87–5.11)
WBC: 7.4 10*3/uL (ref 4.0–10.5)

## 2010-10-18 LAB — URINALYSIS, ROUTINE W REFLEX MICROSCOPIC
Nitrite: NEGATIVE
Protein, ur: NEGATIVE mg/dL
Specific Gravity, Urine: 1.01 (ref 1.005–1.030)
Urobilinogen, UA: 0.2 mg/dL (ref 0.0–1.0)

## 2010-10-18 LAB — DIFFERENTIAL
Basophils Relative: 1 % (ref 0–1)
Eosinophils Absolute: 0.2 10*3/uL (ref 0.0–0.7)
Monocytes Relative: 8 % (ref 3–12)
Neutro Abs: 3.8 10*3/uL (ref 1.7–7.7)
Neutrophils Relative %: 52 % (ref 43–77)

## 2010-11-06 ENCOUNTER — Emergency Department (HOSPITAL_COMMUNITY)
Admission: EM | Admit: 2010-11-06 | Discharge: 2010-11-07 | Disposition: A | Discharge status: Home / Self Care | Payer: Self-pay | Attending: Emergency Medicine | Admitting: Emergency Medicine

## 2010-11-06 ENCOUNTER — Encounter (HOSPITAL_COMMUNITY): Payer: Self-pay | Admitting: *Deleted

## 2010-11-06 DIAGNOSIS — R42 Dizziness and giddiness: Secondary | ICD-10-CM | POA: Insufficient documentation

## 2010-11-06 DIAGNOSIS — H53149 Visual discomfort, unspecified: Secondary | ICD-10-CM | POA: Insufficient documentation

## 2010-11-06 DIAGNOSIS — R51 Headache: Secondary | ICD-10-CM | POA: Insufficient documentation

## 2010-11-06 DIAGNOSIS — Z79899 Other long term (current) drug therapy: Secondary | ICD-10-CM | POA: Insufficient documentation

## 2010-11-06 DIAGNOSIS — R110 Nausea: Secondary | ICD-10-CM | POA: Insufficient documentation

## 2010-11-06 HISTORY — DX: Pure hypercholesterolemia, unspecified: E78.00

## 2010-11-06 NOTE — ED Notes (Signed)
Frontal headache with light/sound sensitivity/dizziness, earache, and nausea that started approx 1900 today.
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
## 2010-11-07 MED ORDER — SODIUM CHLORIDE 0.9 % IV BOLUS (SEPSIS)
1000.0000 mL | Freq: Once | INTRAVENOUS | Status: AC
  Administered 2010-11-07: 1000 mL via INTRAVENOUS

## 2010-11-07 MED ORDER — KETOROLAC TROMETHAMINE 30 MG/ML IJ SOLN
30.0000 mg | Freq: Once | INTRAMUSCULAR | Status: AC
  Administered 2010-11-07: 30 mg via INTRAVENOUS
  Filled 2010-11-07: qty 1

## 2010-11-07 MED ORDER — DIPHENHYDRAMINE HCL 50 MG/ML IJ SOLN
50.0000 mg | Freq: Once | INTRAMUSCULAR | Status: AC
  Administered 2010-11-07: 50 mg via INTRAVENOUS
  Filled 2010-11-07: qty 1

## 2010-11-07 MED ORDER — METOCLOPRAMIDE HCL 5 MG/ML IJ SOLN
10.0000 mg | Freq: Once | INTRAMUSCULAR | Status: AC
  Administered 2010-11-07: 10 mg via INTRAVENOUS
  Filled 2010-11-07: qty 2

## 2010-11-07 MED ORDER — NAPROXEN 500 MG PO TABS: 500.0000 mg | ORAL_TABLET | Freq: Two times a day (BID) | ORAL | Status: AC

## 2010-11-07 NOTE — ED Notes (Signed)
Pt and her husband left the er stating no needs.

## 2011-01-05 ENCOUNTER — Encounter (HOSPITAL_COMMUNITY): Payer: Self-pay | Admitting: *Deleted

## 2011-01-05 ENCOUNTER — Emergency Department (HOSPITAL_COMMUNITY)
Admission: EM | Admit: 2011-01-05 | Discharge: 2011-01-05 | Disposition: A | Discharge status: Home / Self Care | Payer: No Typology Code available for payment source | Attending: Emergency Medicine | Admitting: Emergency Medicine

## 2011-01-05 ENCOUNTER — Emergency Department (HOSPITAL_COMMUNITY): Payer: No Typology Code available for payment source

## 2011-01-05 DIAGNOSIS — R05 Cough: Secondary | ICD-10-CM | POA: Insufficient documentation

## 2011-01-05 DIAGNOSIS — I10 Essential (primary) hypertension: Secondary | ICD-10-CM | POA: Insufficient documentation

## 2011-01-05 DIAGNOSIS — E789 Disorder of lipoprotein metabolism, unspecified: Secondary | ICD-10-CM | POA: Insufficient documentation

## 2011-01-05 DIAGNOSIS — IMO0001 Reserved for inherently not codable concepts without codable children: Secondary | ICD-10-CM | POA: Insufficient documentation

## 2011-01-05 DIAGNOSIS — R059 Cough, unspecified: Secondary | ICD-10-CM | POA: Insufficient documentation

## 2011-01-05 DIAGNOSIS — Z79899 Other long term (current) drug therapy: Secondary | ICD-10-CM | POA: Insufficient documentation

## 2011-01-05 DIAGNOSIS — J069 Acute upper respiratory infection, unspecified: Secondary | ICD-10-CM | POA: Insufficient documentation

## 2011-01-05 LAB — URINALYSIS, ROUTINE W REFLEX MICROSCOPIC
Glucose, UA: NEGATIVE mg/dL
Ketones, ur: NEGATIVE mg/dL
Leukocytes, UA: NEGATIVE
Nitrite: NEGATIVE
Protein, ur: NEGATIVE mg/dL
Urobilinogen, UA: 0.2 mg/dL (ref 0.0–1.0)

## 2011-01-05 NOTE — ED Notes (Signed)
Productive cough, body aches, dizziness, and increased generalized weakness x 4 days.  Denies fever.

## 2011-01-05 NOTE — ED Provider Notes (Signed)
History   This chart was scribed for Kristi Baker, MD by Clarita Crane. The patient was seen in room [facility]03/[facility] and the patient's care was started at 7:18AM.   CSN: [id number]  Arrival date & time 01/05/11  9147   First MD Initiated Contact with Patient 01/05/11 [phone]      Chief Complaint  Patient presents with  . Cough  . Generalized Body Aches    (Consider location/radiation/quality/duration/timing/severity/associated sxs/prior treatment) HPI [name] is a 60 y.o. female who presents to the Emergency Department complaining of constant moderate productive cough with yellow sputum onset 3 days ago with associated myalgias, dizziness, generalized weakness, sinus pressure and bilateral ear pain. Denies fever, n/v/d, dysuria, and increased urinary frequency. Notes symptoms not relieved with use of Ibuprofen. Patient with h/o HTN, UTI, high cholesterol.   Past Medical History  Diagnosis Date  . Hypertension   . Cystitides, interstitial, chronic   . Fibromyalgia   . Urinary tract infection   . Urinary disorder     bladder is in sling per pt  . High cholesterol     Past Surgical History  Procedure Date  . Abdominal hysterectomy   . Lasik   . Toe surgery   . Bladder tack     No family history on file.  History  Substance Use Topics  . Smoking status: Never Smoker   . Smokeless tobacco: Not on file  . Alcohol Use: No    OB History    Grav Para Term Preterm Abortions TAB SAB Ect Mult Living                  Review of Systems 10 Systems reviewed and are negative for acute change except as noted in the HPI.  Allergies  Ciprofloxacin  Home Medications   Current Outpatient Rx  Name Route Sig Dispense Refill  . ALPRAZOLAM 1 MG PO TABS Oral Take 0.5-1 mg by mouth 3 (three) times daily as needed. For anxiety     . AMOXICILLIN PO Oral Take by mouth. Finished today     . CLONIDINE HCL 0.1 MG PO TABS Oral Take 0.1 mg by mouth 2 (two) times daily. Pt is  actually only taking 1 time a day     . CYCLOBENZAPRINE HCL 5 MG PO TABS Oral Take 5 mg by mouth at bedtime as needed. For pain     . DULOXETINE HCL 60 MG PO CPEP Oral Take 60 mg by mouth daily.      Marland Kitchen FLUTICASONE PROPIONATE 50 MCG/ACT NA SUSP Nasal Place 2 sprays into the nose daily.      Marland Kitchen GABAPENTIN 400 MG PO CAPS Oral Take 400 mg by mouth 2 (two) times daily.      Marland Kitchen LISINOPRIL 10 MG PO TABS Oral Take 10 mg by mouth daily.      Marland Kitchen LISINOPRIL PO Oral Take by mouth.      . METOPROLOL SUCCINATE ER 100 MG PO TB24 Oral Take 100 mg by mouth daily.      . TOPROL XL PO Oral Take by mouth.      Marland Kitchen NAPROXEN 500 MG PO TABS Oral Take 1 tablet (500 mg total) by mouth 2 (two) times daily. 30 tablet 0  . TRAMADOL HCL 50 MG PO TABS Oral Take 50 mg by mouth every 6 (six) hours as needed. Maximum dose= 8 tablets per day       BP 178/80  Pulse 60  Temp(Src) 97.7 F (36.5 C) (Oral)  Resp 18  Ht 5\' 5"  (1.651 m)  Wt 195 lb (88.451 kg)  BMI 32.45 kg/m2  SpO2 96%  Physical Exam  Nursing note and vitals reviewed. Constitutional: She is oriented to person, place, and time. She appears well-developed and well-nourished. No distress.  HENT:  Head: Normocephalic and atraumatic.  Right Ear: Tympanic membrane normal.  Left Ear: Tympanic membrane normal.  Mouth/Throat: Oropharynx is clear and moist.       TMs normal bilaterally.   Eyes: EOM are normal. Pupils are equal, round, and reactive to light.  Neck: Neck supple. No tracheal deviation present.  Cardiovascular: Normal rate and normal heart sounds.   No murmur heard. Pulmonary/Chest: Effort normal and breath sounds normal. No respiratory distress. She has no wheezes. She has no rales.  Abdominal: Soft. She exhibits no distension. There is no tenderness.  Musculoskeletal: Normal range of motion. She exhibits no edema.  Lymphadenopathy:    She has no cervical adenopathy.  Neurological: She is alert and oriented to person, place, and time. No sensory  deficit.  Skin: Skin is warm and dry.  Psychiatric: She has a normal mood and affect. Her behavior is normal.    ED Course  Procedures (including critical care time)  DIAGNOSTIC STUDIES: Oxygen Saturation is 96% on room ai, adequate by my interpretation.    COORDINATION OF CARE:    Labs Reviewed  URINALYSIS, ROUTINE W REFLEX MICROSCOPIC - Abnormal; Notable for the following:    APPearance CLOUDY (*)    All other components within normal limits   Dg Chest 2 View  01/05/2011  *RADIOLOGY REPORT*  Clinical Data: .  CHEST - 2 VIEW  Comparison: 03/05/2010.  Findings: Heart is upper limits normal in size.  Lungs are clear. No effusions or acute bony abnormality.  IMPRESSION: No active disease.  Original Report Authenticated By: Cyndie Chime, M.D.     No diagnosis found.    MDM  Issues chest x-ray urinalysis negative. Patient likely has a viral illness we'll discharge home     I personally performed the services described in this documentation, which was scribed in my presence. The recorded information has been reviewed and considered.    Kristi Baker, MD 01/05/11 [phone]

## 2011-01-05 NOTE — ED Notes (Signed)
Pt d/c home with husband in good condition.

## 2011-01-13 ENCOUNTER — Encounter (HOSPITAL_COMMUNITY): Payer: Self-pay

## 2011-01-13 ENCOUNTER — Emergency Department (HOSPITAL_COMMUNITY)
Admission: EM | Admit: 2011-01-13 | Discharge: 2011-01-13 | Disposition: A | Discharge status: Home / Self Care | Payer: No Typology Code available for payment source | Attending: Emergency Medicine | Admitting: Emergency Medicine

## 2011-01-13 DIAGNOSIS — N3090 Cystitis, unspecified without hematuria: Secondary | ICD-10-CM | POA: Insufficient documentation

## 2011-01-13 DIAGNOSIS — Z79899 Other long term (current) drug therapy: Secondary | ICD-10-CM | POA: Insufficient documentation

## 2011-01-13 DIAGNOSIS — E789 Disorder of lipoprotein metabolism, unspecified: Secondary | ICD-10-CM | POA: Insufficient documentation

## 2011-01-13 DIAGNOSIS — IMO0001 Reserved for inherently not codable concepts without codable children: Secondary | ICD-10-CM | POA: Insufficient documentation

## 2011-01-13 DIAGNOSIS — R339 Retention of urine, unspecified: Secondary | ICD-10-CM | POA: Insufficient documentation

## 2011-01-13 DIAGNOSIS — R110 Nausea: Secondary | ICD-10-CM | POA: Insufficient documentation

## 2011-01-13 DIAGNOSIS — R10816 Epigastric abdominal tenderness: Secondary | ICD-10-CM | POA: Insufficient documentation

## 2011-01-13 DIAGNOSIS — M549 Dorsalgia, unspecified: Secondary | ICD-10-CM | POA: Insufficient documentation

## 2011-01-13 DIAGNOSIS — I10 Essential (primary) hypertension: Secondary | ICD-10-CM | POA: Insufficient documentation

## 2011-01-13 DIAGNOSIS — R109 Unspecified abdominal pain: Secondary | ICD-10-CM | POA: Insufficient documentation

## 2011-01-13 LAB — URINALYSIS, ROUTINE W REFLEX MICROSCOPIC
Bilirubin Urine: NEGATIVE
Ketones, ur: NEGATIVE mg/dL
Nitrite: POSITIVE — AB
Protein, ur: NEGATIVE mg/dL
pH: 6.5 (ref 5.0–8.0)

## 2011-01-13 LAB — URINE MICROSCOPIC-ADD ON

## 2011-01-13 MED ORDER — FLUCONAZOLE 200 MG PO TABS: 200.0000 mg | ORAL_TABLET | Freq: Every day | ORAL | Status: AC

## 2011-01-13 MED ORDER — HYDROCODONE-ACETAMINOPHEN 5-500 MG PO TABS: 1.0000 | ORAL_TABLET | Freq: Four times a day (QID) | ORAL | Status: AC | PRN

## 2011-01-13 MED ORDER — KETOROLAC TROMETHAMINE 60 MG/2ML IM SOLN
60.0000 mg | Freq: Once | INTRAMUSCULAR | Status: AC
  Administered 2011-01-13: 60 mg via INTRAMUSCULAR
  Filled 2011-01-13: qty 2

## 2011-01-13 MED ORDER — SULFAMETHOXAZOLE-TRIMETHOPRIM 800-160 MG PO TABS: 1.0000 | ORAL_TABLET | Freq: Two times a day (BID) | ORAL | Status: AC

## 2011-01-13 MED ORDER — SULFAMETHOXAZOLE-TMP DS 800-160 MG PO TABS
1.0000 | ORAL_TABLET | Freq: Once | ORAL | Status: AC
  Administered 2011-01-13: 1 via ORAL
  Filled 2011-01-13: qty 1

## 2011-01-13 MED ORDER — NAPROXEN 500 MG PO TABS: 500.0000 mg | ORAL_TABLET | Freq: Two times a day (BID) | ORAL | Status: DC

## 2011-01-13 NOTE — ED Notes (Signed)
Pt a/ox4. Resp even and unlabored. NAD at this time. D/C instruction reviewed with pt. Pt verbalized understanding. Pt ambulated to lobby with steady gate. 

## 2011-01-13 NOTE — ED Provider Notes (Signed)
History   This chart was scribed for Kristi Roller, MD by Melba Coon. The patient was seen in room APA07/APA07 and the patient's care was started at 7:30PM.    CSN: [id number]  Arrival date & time 01/13/11  1823   First MD Initiated Contact with Patient 01/13/11 1920      Chief Complaint  Patient presents with  . Flank Pain  . Urinary Retention    (Consider location/radiation/quality/duration/timing/severity/associated sxs/prior treatment) HPI [name] is a 60 y.o. female who presents to the Emergency Department complaining of constant, moderate to severe, sharp right flank abdominal pain with an onset this evening. While the pt was driving back home, she experienced the beginning of a continuous pain that includes pressure in lower abdomen radiating bilaterally and mild back pain. She took OTC (ASA) prior to arrival with minimal effect. Pt has had unusual urinary frequency (8-10 times in the last 30 minutes). She has a Hx of interstitial cystitis and recently had a surgical bladder tack. Pt states that she feels "blockage" in her abdomen. Pt rates the severity of the flank pain 8/10. Motion or physical exertion aggravates the pain. Nausea present. Appetite normal. No fever, chills, or vomit.  PCP: Selinda Flavin Specialist: [facility] in [place]     Past Medical History  Diagnosis Date  . Hypertension   . Cystitides, interstitial, chronic   . Fibromyalgia   . Urinary tract infection   . Urinary disorder     bladder is in sling per pt  . High cholesterol     Past Surgical History  Procedure Date  . Abdominal hysterectomy   . Lasik   . Toe surgery   . Bladder tack     No family history on file.  History  Substance Use Topics  . Smoking status: Never Smoker   . Smokeless tobacco: Not on file  . Alcohol Use: No    OB History    Grav Para Term Preterm Abortions TAB SAB Ect Mult Living                  Review of Systems 10 Systems reviewed  and are negative for acute change except as noted in the HPI.  Allergies  Ciprofloxacin  Home Medications   Current Outpatient Rx  Name Route Sig Dispense Refill  . ALPRAZOLAM 1 MG PO TABS Oral Take 0.5-1 mg by mouth 3 (three) times daily as needed. For anxiety     . CLONIDINE HCL 0.1 MG PO TABS Oral Take 0.1 mg by mouth 2 (two) times daily. Pt is actually only taking 1 time a day    . CYCLOBENZAPRINE HCL 5 MG PO TABS Oral Take 5 mg by mouth at bedtime as needed. For pain     . DULOXETINE HCL 30 MG PO CPEP Oral Take 60 mg by mouth daily.      Marland Kitchen ESTRADIOL 1 MG PO TABS Oral Take 1 mg by mouth daily.      Marland Kitchen FLUTICASONE PROPIONATE 50 MCG/ACT NA SUSP Nasal Place 2 sprays into the nose daily.      Marland Kitchen GABAPENTIN 400 MG PO CAPS Oral Take 400 mg by mouth 2 (two) times daily.      Marland Kitchen LISINOPRIL 10 MG PO TABS Oral Take 10 mg by mouth daily.      Marland Kitchen METHENAMINE-SODIUM SALICYLATE 162-162.5 MG PO TABS Oral Take 2 tablets by mouth once as needed. For urinary pain     . METOPROLOL SUCCINATE ER 100 MG  PO TB24 Oral Take 100 mg by mouth daily.      . TRAMADOL HCL 50 MG PO TABS Oral Take 50 mg by mouth every 6 (six) hours as needed. Maximum dose= 8 tablets per day for pain    . HYDROCODONE-ACETAMINOPHEN 5-500 MG PO TABS Oral Take 1-2 tablets by mouth every 6 (six) hours as needed for pain. 15 tablet 0  . NAPROXEN 500 MG PO TABS Oral Take 1 tablet (500 mg total) by mouth 2 (two) times daily. 30 tablet 0  . NAPROXEN 500 MG PO TABS Oral Take 1 tablet (500 mg total) by mouth 2 (two) times daily with a meal. 30 tablet 0  . SULFAMETHOXAZOLE-TRIMETHOPRIM 800-160 MG PO TABS Oral Take 1 tablet by mouth 2 (two) times daily. 14 tablet 0    BP 146/71  Pulse 65  Temp(Src) 97.4 F (36.3 C) (Oral)  Resp 16  Ht 5\' 5"  (1.651 m)  Wt 195 lb (88.451 kg)  BMI 32.45 kg/m2  SpO2 97%  Physical Exam  Nursing note and vitals reviewed. Constitutional: She is oriented to person, place, and time. She appears well-developed and  well-nourished. No distress.  HENT:  Head: Normocephalic and atraumatic.  Eyes: Conjunctivae and EOM are normal. Pupils are equal, round, and reactive to light.  Neck: Normal range of motion. Neck supple. No tracheal deviation present.  Cardiovascular: Normal rate, regular rhythm and normal heart sounds.  Exam reveals no gallop and no friction rub.   No murmur heard. Pulmonary/Chest: Effort normal and breath sounds normal. No respiratory distress.  Abdominal: Soft. She exhibits no distension. There is tenderness (mild epigastric tenderness but unrelated to CC).       No suparpelvic tenderness.  Genitourinary:       No urine discoloration.  Musculoskeletal: Normal range of motion. She exhibits no edema and no tenderness.       No CVA tenderness  Neurological: She is alert and oriented to person, place, and time. No sensory deficit.  Skin: Skin is warm and dry.  Psychiatric: She has a normal mood and affect. Her behavior is normal.    ED Course  Procedures (including critical care time)  DIAGNOSTIC STUDIES: Oxygen Saturation is 97% on room air, normal by my interpretation.    COORDINATION OF CARE:     Labs Reviewed  URINALYSIS, ROUTINE W REFLEX MICROSCOPIC - Abnormal; Notable for the following:    APPearance HAZY (*)    Hgb urine dipstick SMALL (*)    Nitrite POSITIVE (*)    Leukocytes, UA MODERATE (*)    All other components within normal limits  URINE MICROSCOPIC-ADD ON - Abnormal; Notable for the following:    Squamous Epithelial / LPF MANY (*)    Bacteria, UA FEW (*)    All other components within normal limits  URINE CULTURE   No results found.   1. Cystitis       MDM  Urinalysis reveals nitrite positive, leukocyte esterase positive, , 21-50 white blood cells and rare yeast. Patient given Bactrim in the emergency department and prescribed fluconazole to take as outpatient. She has vital signs that are reassuring including a blood pressure of 146/71, no fever, no  tachycardia and no CVA tenderness with no abdominal tenderness over her bladder. I have encouraged her to follow up very closely with her physician.  I personally performed the services described in this documentation, which was scribed in my presence. The recorded information has been reviewed and considered.    Oris Drone  Hyacinth Meeker, MD 01/13/11 [phone]

## 2011-01-13 NOTE — ED Notes (Signed)
Pt presents with bilateral flank pain, urinary urgency and pressure, and burning during urination. Pt denies hematuria. Pt also c/o headache.

## 2011-01-16 LAB — URINE CULTURE: Culture  Setup Time: 201301012221

## 2011-01-17 NOTE — ED Notes (Signed)
+   Urine Patient treated with Bactrim-sensitive to same-chart appended per protocol MD. 

## 2011-02-03 ENCOUNTER — Other Ambulatory Visit (HOSPITAL_COMMUNITY): Payer: Self-pay | Admitting: Urology

## 2011-02-03 DIAGNOSIS — N390 Urinary tract infection, site not specified: Secondary | ICD-10-CM

## 2011-02-06 ENCOUNTER — Ambulatory Visit (HOSPITAL_COMMUNITY)
Admission: RE | Admit: 2011-02-06 | Discharge: 2011-02-06 | Disposition: A | Discharge status: Home / Self Care | Payer: Self-pay | Source: Ambulatory Visit | Attending: Urology | Admitting: Urology

## 2011-02-06 DIAGNOSIS — N390 Urinary tract infection, site not specified: Secondary | ICD-10-CM

## 2011-02-06 DIAGNOSIS — M545 Low back pain, unspecified: Secondary | ICD-10-CM | POA: Insufficient documentation

## 2011-02-06 DIAGNOSIS — R9389 Abnormal findings on diagnostic imaging of other specified body structures: Secondary | ICD-10-CM | POA: Insufficient documentation

## 2011-08-11 ENCOUNTER — Other Ambulatory Visit (HOSPITAL_COMMUNITY): Payer: Self-pay | Admitting: Physician Assistant

## 2011-08-11 DIAGNOSIS — Z1231 Encounter for screening mammogram for malignant neoplasm of breast: Secondary | ICD-10-CM

## 2011-08-12 ENCOUNTER — Ambulatory Visit (HOSPITAL_COMMUNITY)
Admission: RE | Admit: 2011-08-12 | Discharge: 2011-08-12 | Disposition: A | Discharge status: Home / Self Care | Payer: Self-pay | Source: Ambulatory Visit | Attending: Physician Assistant | Admitting: Physician Assistant

## 2011-08-12 DIAGNOSIS — Z1231 Encounter for screening mammogram for malignant neoplasm of breast: Secondary | ICD-10-CM

## 2011-08-18 ENCOUNTER — Other Ambulatory Visit: Payer: Self-pay | Admitting: Physician Assistant

## 2011-08-18 DIAGNOSIS — R928 Other abnormal and inconclusive findings on diagnostic imaging of breast: Secondary | ICD-10-CM

## 2011-09-03 ENCOUNTER — Other Ambulatory Visit (HOSPITAL_COMMUNITY): Payer: Self-pay | Admitting: *Deleted

## 2011-09-03 ENCOUNTER — Other Ambulatory Visit: Payer: Self-pay | Admitting: Vascular Surgery

## 2011-09-03 ENCOUNTER — Ambulatory Visit (HOSPITAL_COMMUNITY)
Admission: RE | Admit: 2011-09-03 | Discharge: 2011-09-03 | Disposition: A | Discharge status: Home / Self Care | Payer: PRIVATE HEALTH INSURANCE | Source: Ambulatory Visit | Attending: Physician Assistant | Admitting: Physician Assistant

## 2011-09-03 ENCOUNTER — Ambulatory Visit (HOSPITAL_COMMUNITY)
Admission: RE | Admit: 2011-09-03 | Discharge: 2011-09-03 | Disposition: A | Discharge status: Home / Self Care | Payer: PRIVATE HEALTH INSURANCE | Source: Ambulatory Visit | Attending: *Deleted | Admitting: *Deleted

## 2011-09-03 DIAGNOSIS — R928 Other abnormal and inconclusive findings on diagnostic imaging of breast: Secondary | ICD-10-CM

## 2011-09-03 DIAGNOSIS — N63 Unspecified lump in unspecified breast: Secondary | ICD-10-CM | POA: Insufficient documentation

## 2011-09-10 ENCOUNTER — Ambulatory Visit (HOSPITAL_COMMUNITY)
Admission: RE | Admit: 2011-09-10 | Discharge: 2011-09-10 | Disposition: A | Discharge status: Home / Self Care | Payer: PRIVATE HEALTH INSURANCE | Source: Ambulatory Visit | Attending: *Deleted | Admitting: *Deleted

## 2011-09-10 ENCOUNTER — Other Ambulatory Visit (HOSPITAL_COMMUNITY): Payer: Self-pay | Admitting: *Deleted

## 2011-09-10 ENCOUNTER — Other Ambulatory Visit: Payer: Self-pay | Admitting: Vascular Surgery

## 2011-09-10 VITALS — BP 120/54 | HR 77

## 2011-09-10 DIAGNOSIS — N63 Unspecified lump in unspecified breast: Secondary | ICD-10-CM | POA: Insufficient documentation

## 2011-09-10 DIAGNOSIS — N6320 Unspecified lump in the left breast, unspecified quadrant: Secondary | ICD-10-CM

## 2011-09-10 NOTE — Progress Notes (Signed)
Lidocaine 2%               8mL injected                         Left breast biopsy performed 

## 2011-12-01 ENCOUNTER — Emergency Department (HOSPITAL_COMMUNITY)
Admission: EM | Admit: 2011-12-01 | Discharge: 2011-12-01 | Disposition: A | Discharge status: Home / Self Care | Payer: Self-pay | Attending: Emergency Medicine | Admitting: Emergency Medicine

## 2011-12-01 ENCOUNTER — Encounter (HOSPITAL_COMMUNITY): Payer: Self-pay

## 2011-12-01 ENCOUNTER — Emergency Department (HOSPITAL_COMMUNITY): Payer: Self-pay

## 2011-12-01 DIAGNOSIS — I10 Essential (primary) hypertension: Secondary | ICD-10-CM | POA: Insufficient documentation

## 2011-12-01 DIAGNOSIS — K5900 Constipation, unspecified: Secondary | ICD-10-CM | POA: Insufficient documentation

## 2011-12-01 DIAGNOSIS — M5430 Sciatica, unspecified side: Secondary | ICD-10-CM | POA: Insufficient documentation

## 2011-12-01 DIAGNOSIS — M5432 Sciatica, left side: Secondary | ICD-10-CM

## 2011-12-01 DIAGNOSIS — N3010 Interstitial cystitis (chronic) without hematuria: Secondary | ICD-10-CM | POA: Insufficient documentation

## 2011-12-01 DIAGNOSIS — E7800 Pure hypercholesterolemia, unspecified: Secondary | ICD-10-CM | POA: Insufficient documentation

## 2011-12-01 DIAGNOSIS — N390 Urinary tract infection, site not specified: Secondary | ICD-10-CM | POA: Insufficient documentation

## 2011-12-01 DIAGNOSIS — IMO0001 Reserved for inherently not codable concepts without codable children: Secondary | ICD-10-CM | POA: Insufficient documentation

## 2011-12-01 DIAGNOSIS — Z79899 Other long term (current) drug therapy: Secondary | ICD-10-CM | POA: Insufficient documentation

## 2011-12-01 HISTORY — DX: Dorsalgia, unspecified: M54.9

## 2011-12-01 LAB — URINALYSIS, ROUTINE W REFLEX MICROSCOPIC
Glucose, UA: NEGATIVE mg/dL
Hgb urine dipstick: NEGATIVE
Ketones, ur: NEGATIVE mg/dL
Protein, ur: NEGATIVE mg/dL

## 2011-12-01 MED ORDER — CYCLOBENZAPRINE HCL 10 MG PO TABS: 10.0000 mg | ORAL_TABLET | Freq: Three times a day (TID) | ORAL | Status: DC | PRN

## 2011-12-01 MED ORDER — DEXAMETHASONE SODIUM PHOSPHATE 4 MG/ML IJ SOLN
10.0000 mg | Freq: Once | INTRAMUSCULAR | Status: AC
  Administered 2011-12-01: 10 mg via INTRAMUSCULAR
  Filled 2011-12-01: qty 3

## 2011-12-01 MED ORDER — TRAMADOL HCL 50 MG PO TABS
50.0000 mg | ORAL_TABLET | Freq: Once | ORAL | Status: AC
  Administered 2011-12-01: 50 mg via ORAL
  Filled 2011-12-01: qty 1

## 2011-12-01 MED ORDER — CYCLOBENZAPRINE HCL 10 MG PO TABS
10.0000 mg | ORAL_TABLET | Freq: Once | ORAL | Status: AC
  Administered 2011-12-01: 10 mg via ORAL
  Filled 2011-12-01: qty 1

## 2011-12-01 MED ORDER — PREDNISONE 50 MG PO TABS: ORAL_TABLET | ORAL | Status: DC

## 2011-12-01 NOTE — ED Notes (Signed)
Pt reports has sciatica, c/o pain in lower back radiating down left leg.  Reports numbness and tingling in left leg.

## 2011-12-03 NOTE — ED Provider Notes (Signed)
History     CSN: [id number]  Arrival date & time 12/01/11  4098   First MD Initiated Contact with Patient 12/01/11 [phone]      Chief Complaint  Patient presents with  . Back Pain    (Consider location/radiation/quality/duration/timing/severity/associated sxs/prior treatment) HPI Comments: Patient with hx of low back pain c/o increasing pain to her left lower back for several days.  States the pain is intermittently radiating into her left leg.  She describes the pain as sharp and tingling at times.  Pain is worse with certain movements and improves with rest.  She denies dysuria, incontinence, saddle anesthesia's, weakness of the lower extremities or abdominal pain.  States pain is similar to previous back pain.    Patient is a 61 y.o. female presenting with back pain. The history is provided by the patient.  Back Pain  This is a recurrent problem. The current episode started more than 2 days ago. The problem occurs constantly. The problem has been gradually worsening. The pain is associated with no known injury. The pain is present in the lumbar spine and sacro-iliac joint. The quality of the pain is described as aching. The pain radiates to the left thigh. The pain is moderate. The symptoms are aggravated by bending, twisting and certain positions. Associated symptoms include numbness, leg pain and tingling. Pertinent negatives include no chest pain, no fever, no abdominal pain, no abdominal swelling, no bowel incontinence, no perianal numbness, no bladder incontinence, no dysuria, no pelvic pain and no weakness. She has tried analgesics and NSAIDs for the symptoms. The treatment provided no relief.    Past Medical History  Diagnosis Date  . Hypertension   . Cystitides, interstitial, chronic   . Fibromyalgia   . Urinary tract infection   . Urinary disorder     bladder is in sling per pt  . High cholesterol   . Back pain     Past Surgical History  Procedure Date  . Abdominal  hysterectomy   . Lasik   . Toe surgery   . Bladder tack     No family history on file.  History  Substance Use Topics  . Smoking status: Never Smoker   . Smokeless tobacco: Not on file  . Alcohol Use: No    OB History    Grav Para Term Preterm Abortions TAB SAB Ect Mult Living                  Review of Systems  Constitutional: Negative for fever, activity change and appetite change.  HENT: Negative for neck pain.   Respiratory: Negative for shortness of breath.   Cardiovascular: Negative for chest pain.  Gastrointestinal: Positive for constipation. Negative for nausea, vomiting, abdominal pain, diarrhea, abdominal distention, rectal pain and bowel incontinence.  Genitourinary: Negative for bladder incontinence, dysuria, hematuria, flank pain, decreased urine volume, difficulty urinating and pelvic pain.       No perineal numbness or incontinence of urine or feces  Musculoskeletal: Positive for back pain. Negative for joint swelling and gait problem.  Skin: Negative for rash.  Neurological: Positive for tingling and numbness. Negative for syncope and weakness.  All other systems reviewed and are negative.    Allergies  Ciprofloxacin  Home Medications   Current Outpatient Rx  Name  Route  Sig  Dispense  Refill  . ALPRAZOLAM 0.5 MG PO TABS   Oral   Take 0.5 mg by mouth 2 (two) times daily.         Marland Kitchen  CETIRIZINE HCL 10 MG PO TABS   Oral   Take 10 mg by mouth daily as needed. Allergies         . CITALOPRAM HYDROBROMIDE 20 MG PO TABS   Oral   Take 20 mg by mouth 2 (two) times daily.         Marland Kitchen CLONIDINE HCL 0.1 MG PO TABS   Oral   Take 0.1 mg by mouth 2 (two) times daily.          . CYCLOBENZAPRINE HCL 5 MG PO TABS   Oral   Take 5 mg by mouth at bedtime.          . DULOXETINE HCL 60 MG PO CPEP   Oral   Take 60 mg by mouth daily.         Marland Kitchen ESTRADIOL 2 MG PO TABS   Oral   Take 2 mg by mouth daily.         Marland Kitchen FLUTICASONE PROPIONATE 50 MCG/ACT  NA SUSP   Nasal   Place 2 sprays into the nose daily as needed. Allergies         . GABAPENTIN 400 MG PO CAPS   Oral   Take 400 mg by mouth 2 (two) times daily. Sometimes patient takes 3 times daily.         . IBUPROFEN 200 MG PO TABS   Oral   Take 800 mg by mouth every 6 (six) hours as needed. Pain         . LISINOPRIL 10 MG PO TABS   Oral   Take 10 mg by mouth 2 (two) times daily.          Marland Kitchen METHENAMINE-SODIUM SALICYLATE 162-162.5 MG PO TABS   Oral   Take 2 tablets by mouth daily as needed. For urinary pain         . OVER THE COUNTER MEDICATION   Oral   Take 1 packet by mouth daily. Isotonic Calcium         . TRAMADOL HCL 50 MG PO TABS   Oral   Take 50 mg by mouth every 6 (six) hours as needed. Maximum dose= 8 tablets per day for pain         . CYCLOBENZAPRINE HCL 10 MG PO TABS   Oral   Take 1 tablet (10 mg total) by mouth 3 (three) times daily as needed for muscle spasms.   21 tablet   0   . PREDNISONE 50 MG PO TABS      One tablet po qd x 5 days   21 tablet   0     BP 153/72  Pulse 59  Temp 98.7 F (37.1 C) (Oral)  Resp 20  Ht 5\' 5"  (1.651 m)  Wt 190 lb (86.183 kg)  BMI 31.62 kg/m2  SpO2 95%  Physical Exam  Nursing note and vitals reviewed. Constitutional: She is oriented to person, place, and time. She appears well-developed and well-nourished. No distress.  HENT:  Head: Normocephalic and atraumatic.  Neck: Normal range of motion. Neck supple.  Cardiovascular: Normal rate, regular rhythm, normal heart sounds and intact distal pulses.   No murmur heard. Pulmonary/Chest: Effort normal and breath sounds normal. No respiratory distress.  Abdominal: Soft. She exhibits no distension and no mass. There is no tenderness. There is no rebound and no guarding.  Musculoskeletal: She exhibits tenderness. She exhibits no edema.       Lumbar back: She exhibits tenderness and pain. She  exhibits normal range of motion, no swelling, no deformity, no  laceration and normal pulse.       Back:       ttp of the left lumbar spine and paraspinal muscles.  DP pulses are strong and equal bilaterally.  Distal sensation intact, no calf pain or edema.  Neurological: She is alert and oriented to person, place, and time. No cranial nerve deficit or sensory deficit. She exhibits normal muscle tone. Coordination and gait normal.  Reflex Scores:      Patellar reflexes are 2+ on the right side and 2+ on the left side.      Achilles reflexes are 2+ on the right side and 2+ on the left side. Skin: Skin is warm and dry.    ED Course  Procedures (including critical care time)  Results for orders placed during the hospital encounter of 12/01/11  URINALYSIS, ROUTINE W REFLEX MICROSCOPIC      Component Value Range   Color, Urine YELLOW  YELLOW   APPearance CLEAR  CLEAR   Specific Gravity, Urine <1.005 (*) 1.005 - 1.030   pH 7.0  5.0 - 8.0   Glucose, UA NEGATIVE  NEGATIVE mg/dL   Hgb urine dipstick NEGATIVE  NEGATIVE   Bilirubin Urine NEGATIVE  NEGATIVE   Ketones, ur NEGATIVE  NEGATIVE mg/dL   Protein, ur NEGATIVE  NEGATIVE mg/dL   Urobilinogen, UA 0.2  0.0 - 1.0 mg/dL   Nitrite NEGATIVE  NEGATIVE   Leukocytes, UA NEGATIVE  NEGATIVE      Dg Lumbar Spine Complete  12/01/2011  *RADIOLOGY REPORT*  Clinical Data: Back pain radiating down left leg.  LUMBAR SPINE - COMPLETE 4+ VIEW  Comparison: CT abdomen pelvis 02/06/2011.  Findings: Mild dextroconvex curvature of the thoracolumbar spine may be positional.  Alignment is otherwise anatomic.  Vertebral body height is maintained.  Minimal endplate degenerative changes from L2-3 to L4-5.  Slight loss of disc space height at L4-5. Minimal facet hypertrophy in the lower lumbar spine.  No definite pars defects.  A 2.3 cm gallstone is seen in the right upper quadrant.  A fair amount of stool is seen in the colon.  IMPRESSION:  1.  Mild spondylosis. 2.  Cholelithiasis.   Original Report Authenticated By: Leanna Battles, M.D.     1. Constipation   2. Sciatica of left side       MDM    Patient has ttp of the left lumbar spine and lumbar paraspinal muscles.  No focal neuro deficits on exam.  Ambulates with a steady gait.   Doubt emergent neurological or infectious process  Discussed x-ray findings with the patient and gallstone finding is old, pt already aware of it.  No abdominal pain , fever or vomiting.  [facility], agrees to f/u with them or return here if her sx's worsen.  Advised to take OTC Miralax to help relieve constipation.    Prescribed: Prednisone 50 mg flexeril      Danyia Borunda L. Ceferino Lang, [place] 12/03/11 2129  Sansa Alkema L. [place], [place] 12/08/11 2236

## 2011-12-10 NOTE — ED Provider Notes (Signed)
Medical screening examination/treatment/procedure(s) were performed by non-physician practitioner and as supervising physician I was immediately available for consultation/collaboration. Devoria Albe, MD, Armando Gang   Ward Givens, MD 12/10/11 [phone]

## 2012-01-11 ENCOUNTER — Emergency Department (HOSPITAL_COMMUNITY): Payer: Medicaid Other

## 2012-01-11 ENCOUNTER — Emergency Department (HOSPITAL_COMMUNITY)
Admission: EM | Admit: 2012-01-11 | Discharge: 2012-01-11 | Disposition: A | Discharge status: Home / Self Care | Payer: Medicaid Other | Attending: Emergency Medicine | Admitting: Emergency Medicine

## 2012-01-11 ENCOUNTER — Encounter (HOSPITAL_COMMUNITY): Payer: Self-pay

## 2012-01-11 DIAGNOSIS — N83201 Unspecified ovarian cyst, right side: Secondary | ICD-10-CM

## 2012-01-11 DIAGNOSIS — E7800 Pure hypercholesterolemia, unspecified: Secondary | ICD-10-CM | POA: Insufficient documentation

## 2012-01-11 DIAGNOSIS — Z8719 Personal history of other diseases of the digestive system: Secondary | ICD-10-CM | POA: Insufficient documentation

## 2012-01-11 DIAGNOSIS — R1084 Generalized abdominal pain: Secondary | ICD-10-CM | POA: Insufficient documentation

## 2012-01-11 DIAGNOSIS — N83209 Unspecified ovarian cyst, unspecified side: Secondary | ICD-10-CM | POA: Insufficient documentation

## 2012-01-11 DIAGNOSIS — Z87448 Personal history of other diseases of urinary system: Secondary | ICD-10-CM | POA: Insufficient documentation

## 2012-01-11 DIAGNOSIS — I10 Essential (primary) hypertension: Secondary | ICD-10-CM | POA: Insufficient documentation

## 2012-01-11 DIAGNOSIS — IMO0002 Reserved for concepts with insufficient information to code with codable children: Secondary | ICD-10-CM | POA: Insufficient documentation

## 2012-01-11 DIAGNOSIS — R109 Unspecified abdominal pain: Secondary | ICD-10-CM

## 2012-01-11 DIAGNOSIS — IMO0001 Reserved for inherently not codable concepts without codable children: Secondary | ICD-10-CM | POA: Insufficient documentation

## 2012-01-11 DIAGNOSIS — Z90710 Acquired absence of both cervix and uterus: Secondary | ICD-10-CM | POA: Insufficient documentation

## 2012-01-11 DIAGNOSIS — Z79899 Other long term (current) drug therapy: Secondary | ICD-10-CM | POA: Insufficient documentation

## 2012-01-11 DIAGNOSIS — Z87440 Personal history of urinary (tract) infections: Secondary | ICD-10-CM | POA: Insufficient documentation

## 2012-01-11 LAB — URINALYSIS, ROUTINE W REFLEX MICROSCOPIC
Glucose, UA: NEGATIVE mg/dL
Leukocytes, UA: NEGATIVE
Nitrite: NEGATIVE
Protein, ur: NEGATIVE mg/dL
pH: 6.5 (ref 5.0–8.0)

## 2012-01-11 LAB — CBC WITH DIFFERENTIAL/PLATELET
Basophils Absolute: 0.1 10*3/uL (ref 0.0–0.1)
Basophils Relative: 1 % (ref 0–1)
Eosinophils Relative: 2 % (ref 0–5)
HCT: 37.6 % (ref 36.0–46.0)
MCH: 30.5 pg (ref 26.0–34.0)
MCHC: 34.3 g/dL (ref 30.0–36.0)
MCV: 88.9 fL (ref 78.0–100.0)
Monocytes Absolute: 0.5 10*3/uL (ref 0.1–1.0)
RDW: 12.9 % (ref 11.5–15.5)

## 2012-01-11 LAB — COMPREHENSIVE METABOLIC PANEL
AST: 13 U/L (ref 0–37)
Albumin: 3.6 g/dL (ref 3.5–5.2)
Calcium: 9.7 mg/dL (ref 8.4–10.5)
Creatinine, Ser: 0.69 mg/dL (ref 0.50–1.10)
GFR calc non Af Amer: >90 mL/min (ref >90)

## 2012-01-11 LAB — OCCULT BLOOD, POC DEVICE: Fecal Occult Bld: NEGATIVE

## 2012-01-11 LAB — LIPASE, BLOOD: Lipase: 14 U/L (ref 11–59)

## 2012-01-11 MED ORDER — IOHEXOL 300 MG/ML  SOLN
100.0000 mL | Freq: Once | INTRAMUSCULAR | Status: AC | PRN
  Administered 2012-01-11: 100 mL via INTRAVENOUS

## 2012-01-11 MED ORDER — PROMETHAZINE HCL 25 MG PO TABS: 25.0000 mg | ORAL_TABLET | Freq: Four times a day (QID) | ORAL | Status: DC | PRN

## 2012-01-11 MED ORDER — ONDANSETRON HCL 4 MG/2ML IJ SOLN
4.0000 mg | Freq: Once | INTRAMUSCULAR | Status: AC
  Administered 2012-01-11: 4 mg via INTRAVENOUS
  Filled 2012-01-11: qty 2

## 2012-01-11 MED ORDER — SODIUM CHLORIDE 0.9 % IV BOLUS (SEPSIS)
1000.0000 mL | Freq: Once | INTRAVENOUS | Status: AC
  Administered 2012-01-11: 1000 mL via INTRAVENOUS

## 2012-01-11 MED ORDER — DIPHENHYDRAMINE HCL 50 MG/ML IJ SOLN
INTRAMUSCULAR | Status: AC
  Filled 2012-01-11: qty 1

## 2012-01-11 MED ORDER — OXYCODONE-ACETAMINOPHEN 5-325 MG PO TABS: 2.0000 | ORAL_TABLET | ORAL | Status: DC | PRN

## 2012-01-11 MED ORDER — FENTANYL CITRATE 0.05 MG/ML IJ SOLN
50.0000 ug | Freq: Once | INTRAMUSCULAR | Status: AC
  Administered 2012-01-11: 50 ug via INTRAVENOUS
  Filled 2012-01-11: qty 2

## 2012-01-11 MED ORDER — HYDROMORPHONE HCL PF 1 MG/ML IJ SOLN
1.0000 mg | Freq: Once | INTRAMUSCULAR | Status: AC
  Administered 2012-01-11: 1 mg via INTRAVENOUS
  Filled 2012-01-11: qty 1

## 2012-01-11 MED ORDER — DIPHENHYDRAMINE HCL 50 MG/ML IJ SOLN
12.5000 mg | Freq: Once | INTRAMUSCULAR | Status: AC
  Administered 2012-01-11: 12.5 mg via INTRAVENOUS

## 2012-01-11 MED ORDER — ALPRAZOLAM 0.5 MG PO TABS
0.5000 mg | ORAL_TABLET | Freq: Once | ORAL | Status: AC
  Administered 2012-01-11: 0.5 mg via ORAL
  Filled 2012-01-11: qty 1

## 2012-01-11 NOTE — ED Notes (Signed)
Pt alert & oriented x4, stable gait. Patient given discharge instructions, paperwork & prescription(s). Patient  instructed to stop at the registration desk to finish any additional paperwork. Patient verbalized understanding. Pt left department w/ no further questions. 

## 2012-01-11 NOTE — ED Notes (Signed)
Pt c/o abdominal "discomfort", blood in stool, and lower back pain. Pt states she noticed "small amounts" of "blood and mucus" in her stool this morning x2. Pt describes abdominal pain as "raw" and "sensitive".

## 2012-01-11 NOTE — ED Notes (Signed)
Pt noticed blood in her stool this am x2, has been told she was impacted, that was nov 18th. Has not been able to follow up with pmd.  Cont to have "stomach issues", +nausea, no vomiting. No fever.

## 2012-01-11 NOTE — ED Notes (Signed)
Snack and soda given per Joselyn Glassman RN

## 2012-01-11 NOTE — ED Provider Notes (Signed)
History   This chart was scribed for Donnetta Hutching, MD by Leone Payor, ED Scribe. This patient was seen in room APA12/APA12 and the patient's care was started at 1519.   CSN: [id number]  Arrival date & time 01/11/12  1258   First MD Initiated Contact with Patient 01/11/12 1519      Chief Complaint  Patient presents with  . Rectal Bleeding     The history is provided by the patient. No language interpreter was used.    [name] is a 61 y.o. female who presents to the Emergency Department complaining of recurrent, mild abdominal pain with associated 2 episodes of rectal bleeding in her stool starting last night. Pt states having "gushing" bowel movements with a lot of mucous and blood in her stools. She states her stools looked "muddy". She has associated nausea but denies vomiting, fever.    PCP DR. Carollee Herter [facility] in [place].  Pt has h/o HTN, high cholesterol, vaginal hysterectomy, interstitial cystitis.  Pt denies smoking and alcohol use.  Past Medical History  Diagnosis Date  . Hypertension   . Cystitides, interstitial, chronic   . Fibromyalgia   . Urinary tract infection   . Urinary disorder     bladder is in sling per pt  . High cholesterol   . Back pain     Past Surgical History  Procedure Date  . Abdominal hysterectomy   . Lasik   . Toe surgery   . Bladder tack     No family history on file.  History  Substance Use Topics  . Smoking status: Never Smoker   . Smokeless tobacco: Not on file  . Alcohol Use: No    No OB history provided.   Review of Systems  A complete 10 system review of systems was obtained and all systems are negative except as noted in the HPI and PMH.    Allergies  Ciprofloxacin  Home Medications   Current Outpatient Rx  Name  Route  Sig  Dispense  Refill  . ALPRAZOLAM 0.5 MG PO TABS   Oral   Take 0.5 mg by mouth 2 (two) times daily.         Marland Kitchen CETIRIZINE HCL 10 MG PO TABS   Oral   Take 10 mg by  mouth daily as needed. Allergies         . CITALOPRAM HYDROBROMIDE 20 MG PO TABS   Oral   Take 20 mg by mouth 2 (two) times daily.         Marland Kitchen CLONIDINE HCL 0.1 MG PO TABS   Oral   Take 0.1 mg by mouth 2 (two) times daily.          . CYCLOBENZAPRINE HCL 10 MG PO TABS   Oral   Take 1 tablet (10 mg total) by mouth 3 (three) times daily as needed for muscle spasms.   21 tablet   0   . CYCLOBENZAPRINE HCL 5 MG PO TABS   Oral   Take 5 mg by mouth at bedtime.          . DULOXETINE HCL 60 MG PO CPEP   Oral   Take 60 mg by mouth daily.         Marland Kitchen ESTRADIOL 2 MG PO TABS   Oral   Take 2 mg by mouth daily.         Marland Kitchen FLUTICASONE PROPIONATE 50 MCG/ACT NA SUSP   Nasal   Place 2 sprays into  the nose daily as needed. Allergies         . GABAPENTIN 400 MG PO CAPS   Oral   Take 400 mg by mouth 2 (two) times daily. Sometimes patient takes 3 times daily.         . IBUPROFEN 200 MG PO TABS   Oral   Take 800 mg by mouth every 6 (six) hours as needed. Pain         . LISINOPRIL 10 MG PO TABS   Oral   Take 10 mg by mouth 2 (two) times daily.          Marland Kitchen METHENAMINE-SODIUM SALICYLATE 162-162.5 MG PO TABS   Oral   Take 2 tablets by mouth daily as needed. For urinary pain         . OVER THE COUNTER MEDICATION   Oral   Take 1 packet by mouth daily. Isotonic Calcium         . PREDNISONE 50 MG PO TABS      One tablet po qd x 5 days   21 tablet   0   . TRAMADOL HCL 50 MG PO TABS   Oral   Take 50 mg by mouth every 6 (six) hours as needed. Maximum dose= 8 tablets per day for pain           BP 125/59  Pulse 58  Temp 98.3 F (36.8 C) (Oral)  Resp 18  Ht 5\' 5"  (1.651 m)  Wt 190 lb (86.183 kg)  BMI 31.62 kg/m2  SpO2 98%  Physical Exam  Nursing note and vitals reviewed. Constitutional: She is oriented to person, place, and time. She appears well-developed and well-nourished.  HENT:  Head: Normocephalic and atraumatic.  Eyes: Conjunctivae normal and EOM  are normal. Pupils are equal, round, and reactive to light.  Neck: Normal range of motion. Neck supple.  Cardiovascular: Normal rate, regular rhythm and normal heart sounds.   Pulmonary/Chest: Effort normal and breath sounds normal.  Abdominal: Soft. Bowel sounds are normal.       Minimal generalized tenderness on the abdomen.   Genitourinary:       No masses, heme negative.   Musculoskeletal: Normal range of motion.  Neurological: She is alert and oriented to person, place, and time.  Skin: Skin is warm and dry.  Psychiatric: She has a normal mood and affect.    ED Course  Procedures (including critical care time)  DIAGNOSTIC STUDIES: Oxygen Saturation is 98% on room air, normal by my interpretation.    COORDINATION OF CARE:  3:40 PM Discussed treatment plan which includes blood work, CT scan, and rectal exam with pt at bedside and pt agreed to plan.  8: 08 PM Advised pt of radiology and lab work results. Pt advised to follow up with Dr. Emelda Fear OB-GYN.    Results for orders placed during the hospital encounter of 01/11/12  CBC WITH DIFFERENTIAL      Component Value Range   WBC 8.7  4.0 - 10.5 K/uL   RBC 4.23  3.87 - 5.11 MIL/uL   Hemoglobin 12.9  12.0 - 15.0 g/dL   HCT 40.9  81.1 - 91.4 %   MCV 88.9  78.0 - 100.0 fL   MCH 30.5  26.0 - 34.0 pg   MCHC 34.3  30.0 - 36.0 g/dL   RDW 78.2  95.6 - 21.3 %   Platelets 252  150 - 400 K/uL   Neutrophils Relative 61  43 - 77 %  Neutro Abs 5.3  1.7 - 7.7 K/uL   Lymphocytes Relative 30  12 - 46 %   Lymphs Abs 2.6  0.7 - 4.0 K/uL   Monocytes Relative 6  3 - 12 %   Monocytes Absolute 0.5  0.1 - 1.0 K/uL   Eosinophils Relative 2  0 - 5 %   Eosinophils Absolute 0.1  0.0 - 0.7 K/uL   Basophils Relative 1  0 - 1 %   Basophils Absolute 0.1  0.0 - 0.1 K/uL  COMPREHENSIVE METABOLIC PANEL      Component Value Range   Sodium 134 (*) 135 - 145 mEq/L   Potassium 3.6  3.5 - 5.1 mEq/L   Chloride 95 (*) 96 - 112 mEq/L   CO2 29  19 - 32  mEq/L   Glucose, Bld 90  70 - 99 mg/dL   BUN 12  6 - 23 mg/dL   Creatinine, Ser 4.09  0.50 - 1.10 mg/dL   Calcium 9.7  8.4 - 81.1 mg/dL   Total Protein 6.8  6.0 - 8.3 g/dL   Albumin 3.6  3.5 - 5.2 g/dL   AST 13  0 - 37 U/L   ALT 14  0 - 35 U/L   Alkaline Phosphatase 48  39 - 117 U/L   Total Bilirubin 0.3  0.3 - 1.2 mg/dL   GFR calc non Af Amer >90  >90 mL/min   GFR calc Af Amer >90  >90 mL/min  LIPASE, BLOOD      Component Value Range   Lipase 14  11 - 59 U/L  URINALYSIS, ROUTINE W REFLEX MICROSCOPIC      Component Value Range   Color, Urine YELLOW  YELLOW   APPearance CLEAR  CLEAR   Specific Gravity, Urine 1.010  1.005 - 1.030   pH 6.5  5.0 - 8.0   Glucose, UA NEGATIVE  NEGATIVE mg/dL   Hgb urine dipstick NEGATIVE  NEGATIVE   Bilirubin Urine NEGATIVE  NEGATIVE   Ketones, ur NEGATIVE  NEGATIVE mg/dL   Protein, ur NEGATIVE  NEGATIVE mg/dL   Urobilinogen, UA 0.2  0.0 - 1.0 mg/dL   Nitrite NEGATIVE  NEGATIVE   Leukocytes, UA NEGATIVE  NEGATIVE   No results found.    Labs Reviewed - No data to display Ct Abdomen Pelvis W Contrast  01/11/2012  *RADIOLOGY REPORT*  Clinical Data: Bloody stools.  Question colitis.  Nausea.  CT ABDOMEN AND PELVIS WITH CONTRAST  Technique:  Multidetector CT imaging of the abdomen and pelvis was performed following the standard protocol during bolus administration of intravenous contrast.  Contrast: OMNIPAQUE IOHEXOL 300 MG/ML  SOLN  Comparison: 02/06/2011.  Findings: Lung bases are clear.  No effusions.  Heart is normal size.  Small hiatal hernia.  Small low-density area centrally within the liver, likely small cyst.  Large calcified gallstone within the gallbladder.  Stomach, spleen, pancreas, adrenals and kidneys are normal.  Large and small bowel grossly unremarkable.  No inflammatory process or wall thickening to suggest colitis.  No free fluid or free air.  Complex cystic mass noted within the right ovary.  Multiple internal septations.  This  measures approximately 8 x 6 cm. Multiple small cystic areas within the left ovary.  Prior hysterectomy.  Appendix is visualized and is normal.  Aorta and iliac vessels are normal caliber with scattered calcifications.  No acute bony abnormality.  IMPRESSION: Large multi septated cystic mass within the right ovary measuring up to 8 cm.  Smaller  cystic areas within the left ovary.  Cannot exclude cystic ovarian neoplasms.  Cholelithiasis.  No evidence for colitis/enteritis.   Original Report Authenticated By: Charlett Nose, M.D.      No diagnosis found.    MDM  CT scan reveals a multi-septated cystic mass in the right ovary.  Discussed with Dr. Debroah Loop OB/GYN on call.  Will refer to Dr. Emelda Fear. This was discussed with the patient and the husband. Discharge home with Percocet #20 and Phenergan 25 mg #20      I personally performed the services described in this documentation, which was scribed in my presence. The recorded information has been reviewed and is accurate.    Donnetta Hutching, MD 01/11/12 2039

## 2012-02-01 ENCOUNTER — Emergency Department (HOSPITAL_COMMUNITY): Payer: Self-pay

## 2012-02-01 ENCOUNTER — Encounter (HOSPITAL_COMMUNITY): Payer: Self-pay | Admitting: *Deleted

## 2012-02-01 ENCOUNTER — Emergency Department (HOSPITAL_COMMUNITY)
Admission: EM | Admit: 2012-02-01 | Discharge: 2012-02-01 | Disposition: A | Discharge status: Home / Self Care | Payer: Self-pay | Attending: Emergency Medicine | Admitting: Emergency Medicine

## 2012-02-01 DIAGNOSIS — E7800 Pure hypercholesterolemia, unspecified: Secondary | ICD-10-CM | POA: Insufficient documentation

## 2012-02-01 DIAGNOSIS — Z87898 Personal history of other specified conditions: Secondary | ICD-10-CM | POA: Insufficient documentation

## 2012-02-01 DIAGNOSIS — Z87440 Personal history of urinary (tract) infections: Secondary | ICD-10-CM | POA: Insufficient documentation

## 2012-02-01 DIAGNOSIS — M25559 Pain in unspecified hip: Secondary | ICD-10-CM | POA: Insufficient documentation

## 2012-02-01 DIAGNOSIS — M5416 Radiculopathy, lumbar region: Secondary | ICD-10-CM

## 2012-02-01 DIAGNOSIS — Z87448 Personal history of other diseases of urinary system: Secondary | ICD-10-CM | POA: Insufficient documentation

## 2012-02-01 DIAGNOSIS — Z79899 Other long term (current) drug therapy: Secondary | ICD-10-CM | POA: Insufficient documentation

## 2012-02-01 DIAGNOSIS — I10 Essential (primary) hypertension: Secondary | ICD-10-CM | POA: Insufficient documentation

## 2012-02-01 DIAGNOSIS — IMO0002 Reserved for concepts with insufficient information to code with codable children: Secondary | ICD-10-CM | POA: Insufficient documentation

## 2012-02-01 LAB — URINALYSIS, ROUTINE W REFLEX MICROSCOPIC
Bilirubin Urine: NEGATIVE
Glucose, UA: NEGATIVE mg/dL
Ketones, ur: NEGATIVE mg/dL
Protein, ur: NEGATIVE mg/dL
pH: 6 (ref 5.0–8.0)

## 2012-02-01 MED ORDER — OXYCODONE-ACETAMINOPHEN 5-325 MG PO TABS: 1.0000 | ORAL_TABLET | ORAL | Status: DC | PRN

## 2012-02-01 MED ORDER — CYCLOBENZAPRINE HCL 10 MG PO TABS
10.0000 mg | ORAL_TABLET | Freq: Once | ORAL | Status: AC
  Administered 2012-02-01: 10 mg via ORAL
  Filled 2012-02-01: qty 1

## 2012-02-01 MED ORDER — CYCLOBENZAPRINE HCL 10 MG PO TABS: 10.0000 mg | ORAL_TABLET | Freq: Three times a day (TID) | ORAL | Status: DC | PRN

## 2012-02-01 MED ORDER — HYDROMORPHONE HCL PF 1 MG/ML IJ SOLN
1.0000 mg | Freq: Once | INTRAMUSCULAR | Status: AC
  Administered 2012-02-01: 1 mg via INTRAMUSCULAR
  Filled 2012-02-01: qty 1
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
## 2012-02-01 NOTE — ED Notes (Signed)
Patient with no complaints at this time. Respirations even and unlabored. Skin warm/dry. Discharge instructions reviewed with patient at this time. Patient given opportunity to voice concerns/ask questions. Patient discharged at this time and left Emergency Department with steady gait.   

## 2012-02-03 ENCOUNTER — Encounter (HOSPITAL_COMMUNITY): Payer: Self-pay | Admitting: Pharmacy Technician

## 2012-02-04 NOTE — ED Provider Notes (Signed)
Medical screening examination/treatment/procedure(s) were performed by non-physician practitioner and as supervising physician I was immediately available for consultation/collaboration.  Donnetta Hutching, MD 02/04/12 1735

## 2012-02-06 ENCOUNTER — Other Ambulatory Visit: Payer: Self-pay | Admitting: Obstetrics and Gynecology

## 2012-02-06 DIAGNOSIS — M5430 Sciatica, unspecified side: Secondary | ICD-10-CM

## 2012-02-09 ENCOUNTER — Ambulatory Visit (HOSPITAL_COMMUNITY)
Admission: RE | Admit: 2012-02-09 | Discharge: 2012-02-09 | Disposition: A | Discharge status: Home / Self Care | Payer: Self-pay | Source: Ambulatory Visit | Attending: Obstetrics and Gynecology | Admitting: Obstetrics and Gynecology

## 2012-02-09 DIAGNOSIS — M545 Low back pain, unspecified: Secondary | ICD-10-CM | POA: Insufficient documentation

## 2012-02-09 DIAGNOSIS — M5430 Sciatica, unspecified side: Secondary | ICD-10-CM

## 2012-02-09 DIAGNOSIS — M48061 Spinal stenosis, lumbar region without neurogenic claudication: Secondary | ICD-10-CM | POA: Insufficient documentation

## 2012-02-09 DIAGNOSIS — M5137 Other intervertebral disc degeneration, lumbosacral region: Secondary | ICD-10-CM | POA: Insufficient documentation

## 2012-02-09 DIAGNOSIS — M79609 Pain in unspecified limb: Secondary | ICD-10-CM | POA: Insufficient documentation

## 2012-02-09 DIAGNOSIS — M51379 Other intervertebral disc degeneration, lumbosacral region without mention of lumbar back pain or lower extremity pain: Secondary | ICD-10-CM | POA: Insufficient documentation

## 2012-02-10 ENCOUNTER — Encounter (HOSPITAL_COMMUNITY): Admission: RE | Admit: 2012-02-10 | Discharge: 2012-02-10 | Payer: Self-pay | Source: Ambulatory Visit

## 2012-02-10 NOTE — Patient Instructions (Addendum)
[name]  02/10/2012   Your procedure is scheduled on:  02/17/2012  Report to [facility] at  615  AM.  Call this number if you have problems the morning of surgery: [phone]   Remember:   Do not eat food or drink liquids after midnight.   Take these medicines the morning of surgery with A SIP OF WATER: flexaril,percocet,lopressor,phenergan,norvasc,xanax,celexa,zyrtec,catapress,cymbalta,neurontin,lisinopril   Do not wear jewelry, make-up or nail polish.  Do not wear lotions, powders, or perfumes.   Do not shave 48 hours prior to surgery. Men may shave face and neck.  Do not bring valuables to the hospital.  Contacts, dentures or bridgework may not be worn into surgery.  Leave suitcase in the car. After surgery it may be brought to your room.  For patients admitted to the hospital, checkout time is 11:00 AM the day of discharge.   Patients discharged the day of surgery will not be allowed to drive  home.  Name and phone number of your driver: family  Special Instructions: Shower using CHG 2 nights before surgery and the night before surgery.  If you shower the day of surgery use CHG.  Use special wash - you have one bottle of CHG for all showers.  You should use approximately 1/3 of the bottle for each shower.   Please read over the following fact sheets that you were given: Pain Booklet, Coughing and Deep Breathing, MRSA Information, Surgical Site Infection Prevention, Anesthesia Post-op Instructions and Care and Recovery After Surgery Bilateral Salpingo-Oophorectomy Removal of both fallopian tubes and ovaries is called a Bilateral Salpingo-oophorectomy (BSO). The fallopian tubes transport the egg from the ovary to the womb (uterus). The fallopian tube is also where the sperm and egg meet and become fertilized and move down into the uterus. Usually when a BSO is done, the uterus was previously removed. Removing both tubes and ovaries will:  Put you into the menopause. You  will no longer have menstrual periods.  May cause you to have symptoms of menopause (hot flashes, night sweats, mood changes).  Not affect your sex drive or physical relationship.  Cause you to not be able to become pregnant (sterile). LET YOUR CAREGIVER KNOW ABOUT:  Allergies to food or medication.  Medications taken including herbs, eye drops, over-the-counter medications, and creams.  Use of steroids (by mouth or creams).  Previous problems with anesthetics or numbing medication.  Possibility of pregnancy, if this applies.  Your smoking habits  History of blood clots (thrombophlebitis).  History of bleeding or blood problems.  Previous surgeries.  Other health problems. RISKS AND COMPLICATIONS All surgery is associated with risks. Some of these risks are:  Injury to surrounding organs.  Bleeding.  Infection.  Blood clots in the legs or lungs.  Problems with the anesthesia.  The surgery does not help the problem.  Death. BEFORE THE PROCEDURE  Do not take aspirin or blood thinners because it can make you bleed.  Do not eat or drink anything at least 8 hours before the surgery.  Let your caregiver know if you develop a cold or an infection.  If you are being admitted the day of surgery, arrive at least 1 hour before the surgery to read and sign the necessary forms and consents.  Arrange for help when you go home from the hospital.  If you smoke, do not smoke for at least 2 weeks before the surgery. PROCEDURE  You will change into a hospital gown. Then, you  will be given an IV (intravenous) and a medication to relax you. You will be put to sleep with an anesthetic. Any hair on your lower belly (abdomen) will be removed, and a catheter will be placed in your bladder. The fallopian tubes and ovaries will be removed either through 2 very small cuts (incisions) or through large incision in the lower abdomen. AFTER THE PROCEDURE  You will be taken to the  recovery room for 1 to 3 hours until your blood pressure, pulse, and temperature are stable and you are waking up.  If you had a laparoscopy, you may be discharged in several hours.  If you had a laparoscopy, you may have shoulder pain for a day or two from air left in the abdomen. The air can irritate the nerve that goes from the diaphragm to the shoulder.  You will be given pain medication as is necessary.  The intravenous and catheter will be removed.  Have someone available to take you home from the hospital. HOME CARE INSTRUCTIONS   Only take over-the-counter or prescription medicines for pain, discomfort, or fever as directed by your caregiver.  Do not take aspirin. It can cause bleeding.  Do not drive when taking pain medication.  Follow your caregiver's advice regarding diet, exercise, lifting, driving, and general activities.  You may resume your usual diet as directed and allowed.  Get plenty of rest and sleep.  Do not douche, use tampons, or have sexual intercourse until your caregiver says it is okay.  Change your bandages (dressings) as directed.  Take your temperature twice a day and write it down.  Your caregiver may recommend showers instead of baths for a few weeks.  Do not drink alcohol until your caregiver says it is okay.  If you develop constipation, you may take a mild laxative with your caregiver's permission. Bran foods and drinking fluids helps with constipation problems.  Try to have someone home with you for a week or two to help with the household activities.  Make sure you and your family understands everything about your operation and recovery.  Do not sign any legal documents until you feel normal again.  Keep all your follow-up appointments. SEEK MEDICAL CARE IF:   There is swelling, redness, or increasing pain in the wound area.  Pus is coming from the wound.  You notice a bad smell from the wound or surgical dressing.  You have  pain, redness, or swelling from the intravenous site.  The wound is breaking open (the edges are not staying together).  You feel dizzy or feel like fainting.  You develop pain or bleeding when you urinate.  You develop diarrhea.  You develop nausea and vomiting.  You develop abnormal vaginal discharge.  You develop a rash.  You have any type of abnormal reaction or develop an allergy to your medication.  You need stronger pain medication for your pain. SEEK IMMEDIATE MEDICAL CARE IF:   You develop an unexplained temperature above 100 F (37.8 C).  You develop abdominal pain.  You develop chest pain.  You develop shortness of breath.  You pass out.  You develop pain, swelling, or redness of your leg.  You develop heavy vaginal bleeding with or without blood clots. Document Released: 12/30/2004 Document Revised: 03/24/2011 Document Reviewed: 05/26/2008 [facility] Patient Information 2013 [place], [place]. PATIENT INSTRUCTIONS POST-ANESTHESIA  IMMEDIATELY FOLLOWING SURGERY:  Do not drive or operate machinery for the first twenty four hours after surgery.  Do not make any important  decisions for twenty four hours after surgery or while taking narcotic pain medications or sedatives.  If you develop intractable nausea and vomiting or a severe headache please notify your doctor immediately.  FOLLOW-UP:  Please make an appointment with your surgeon as instructed. You do not need to follow up with anesthesia unless specifically instructed to do so.  WOUND CARE INSTRUCTIONS (if applicable):  Keep a dry clean dressing on the anesthesia/puncture wound site if there is drainage.  Once the wound has quit draining you may leave it open to air.  Generally you should leave the bandage intact for twenty four hours unless there is drainage.  If the epidural site drains for more than 36-48 hours please call the anesthesia department.  QUESTIONS?:  Please feel free to call your physician or  the hospital operator if you have any questions, and they will be happy to assist you.

## 2012-02-11 ENCOUNTER — Encounter (HOSPITAL_COMMUNITY): Admission: RE | Admit: 2012-02-11 | Payer: Self-pay | Source: Ambulatory Visit

## 2012-02-12 ENCOUNTER — Encounter (HOSPITAL_COMMUNITY)
Admission: RE | Admit: 2012-02-12 | Discharge: 2012-02-12 | Disposition: A | Discharge status: Home / Self Care | Payer: Self-pay | Source: Ambulatory Visit | Attending: Obstetrics and Gynecology | Admitting: Obstetrics and Gynecology

## 2012-02-12 ENCOUNTER — Other Ambulatory Visit: Payer: Self-pay | Admitting: Obstetrics and Gynecology

## 2012-02-12 ENCOUNTER — Encounter (HOSPITAL_COMMUNITY): Payer: Self-pay

## 2012-02-12 HISTORY — DX: Gastro-esophageal reflux disease without esophagitis: K21.9

## 2012-02-12 HISTORY — DX: Anxiety disorder, unspecified: F41.9

## 2012-02-12 HISTORY — DX: Dizziness and giddiness: R42

## 2012-02-12 HISTORY — DX: Depression, unspecified: F32.A

## 2012-02-12 HISTORY — DX: Major depressive disorder, single episode, unspecified: F32.9

## 2012-02-12 HISTORY — DX: Shortness of breath: R06.02

## 2012-02-12 LAB — URINALYSIS, ROUTINE W REFLEX MICROSCOPIC
Bilirubin Urine: NEGATIVE
Glucose, UA: NEGATIVE mg/dL
Hgb urine dipstick: NEGATIVE
Nitrite: NEGATIVE
Specific Gravity, Urine: 1.01 (ref 1.005–1.030)
pH: 6 (ref 5.0–8.0)

## 2012-02-12 LAB — CBC
HCT: 37.8 % (ref 36.0–46.0)
Hemoglobin: 13.2 g/dL (ref 12.0–15.0)
MCH: 31 pg (ref 26.0–34.0)
MCHC: 34.9 g/dL (ref 30.0–36.0)
MCV: 88.7 fL (ref 78.0–100.0)
RBC: 4.26 MIL/uL (ref 3.87–5.11)

## 2012-02-12 LAB — BASIC METABOLIC PANEL
BUN: 13 mg/dL (ref 6–23)
CO2: 31 mEq/L (ref 19–32)
Glucose, Bld: 112 mg/dL — ABNORMAL HIGH (ref 70–99)
Potassium: 3.2 mEq/L — ABNORMAL LOW (ref 3.5–5.1)
Sodium: 134 mEq/L — ABNORMAL LOW (ref 135–145)

## 2012-02-12 MED ORDER — MAGNESIUM CITRATE PO SOLN: 1.0000 | Freq: Once | ORAL | Status: DC

## 2012-02-12 NOTE — Patient Instructions (Signed)
[name]  02/12/2012   Your procedure is scheduled on:  02/17/12  Report to [facility] at [place] AM.  Call this number if you have problems the morning of surgery: [phone]   Remember:   Do not eat food or drink liquids after midnight.   Take these medicines the morning of surgery with A SIP OF WATER: amlodipine, clonidine, cymbalta, metoprolol   Do not wear jewelry, make-up or nail polish.  Do not wear lotions, powders, or perfumes. You may wear deodorant.  Do not shave 48 hours prior to surgery. Men may shave face and neck.  Do not bring valuables to the hospital.  Contacts, dentures or bridgework may not be worn into surgery.  Leave suitcase in the car. After surgery it may be brought to your room.  For patients admitted to the hospital, checkout time is 11:00 AM the day of  discharge.   Patients discharged the day of surgery will not be allowed to drive  home.  Name and phone number of your driver: family  Special Instructions: Shower using CHG 2 nights before surgery and the night before surgery.  If you shower the day of surgery use CHG.  Use special wash - you have one bottle of CHG for all showers.  You should use approximately 1/3 of the bottle for each shower.   Please read over the following fact sheets that you were given: Pain Booklet, MRSA Information, Surgical Site Infection Prevention, Anesthesia Post-op Instructions and Care and Recovery After Surgery   PATIENT INSTRUCTIONS POST-ANESTHESIA  IMMEDIATELY FOLLOWING SURGERY:  Do not drive or operate machinery for the first twenty four hours after surgery.  Do not make any important decisions for twenty four hours after surgery or while taking narcotic pain medications or sedatives.  If you develop intractable nausea and vomiting or a severe headache please notify your doctor immediately.  FOLLOW-UP:  Please make an appointment with your surgeon as instructed. You do not need to follow up with anesthesia unless  specifically instructed to do so.  WOUND CARE INSTRUCTIONS (if applicable):  Keep a dry clean dressing on the anesthesia/puncture wound site if there is drainage.  Once the wound has quit draining you may leave it open to air.  Generally you should leave the bandage intact for twenty four hours unless there is drainage.  If the epidural site drains for more than 36-48 hours please call the anesthesia department.  QUESTIONS?:  Please feel free to call your physician or the hospital operator if you have any questions, and they will be happy to assist you.      Bilateral Salpingo-Oophorectomy Removal of both fallopian tubes and ovaries is called a Bilateral Salpingo-oophorectomy (BSO). The fallopian tubes transport the egg from the ovary to the womb (uterus). The fallopian tube is also where the sperm and egg meet and become fertilized and move down into the uterus. Usually when a BSO is done, the uterus was previously removed. Removing both tubes and ovaries will:  Put you into the menopause. You will no longer have menstrual periods.  May cause you to have symptoms of menopause (hot flashes, night sweats, mood changes).  Not affect your sex drive or physical relationship.  Cause you to not be able to become pregnant (sterile). LET YOUR CAREGIVER KNOW ABOUT:  Allergies to food or medication.  Medications taken including herbs, eye drops, over-the-counter medications, and creams.  Use of steroids (by mouth or creams).  Previous problems with anesthetics or numbing medication.  Possibility of pregnancy, if this applies.  Your smoking habits  History of blood clots (thrombophlebitis).  History of bleeding or blood problems.  Previous surgeries.  Other health problems. RISKS AND COMPLICATIONS All surgery is associated with risks. Some of these risks are:  Injury to surrounding organs.  Bleeding.  Infection.  Blood clots in the legs or lungs.  Problems with the  anesthesia.  The surgery does not help the problem.  Death. BEFORE THE PROCEDURE  Do not take aspirin or blood thinners because it can make you bleed.  Do not eat or drink anything at least 8 hours before the surgery.  Let your caregiver know if you develop a cold or an infection.  If you are being admitted the day of surgery, arrive at least 1 hour before the surgery to read and sign the necessary forms and consents.  Arrange for help when you go home from the hospital.  If you smoke, do not smoke for at least 2 weeks before the surgery. PROCEDURE  You will change into a hospital gown. Then, you will be given an IV (intravenous) and a medication to relax you. You will be put to sleep with an anesthetic. Any hair on your lower belly (abdomen) will be removed, and a catheter will be placed in your bladder. The fallopian tubes and ovaries will be removed either through 2 very small cuts (incisions) or through large incision in the lower abdomen. AFTER THE PROCEDURE  You will be taken to the recovery room for 1 to 3 hours until your blood pressure, pulse, and temperature are stable and you are waking up.  If you had a laparoscopy, you may be discharged in several hours.  If you had a laparoscopy, you may have shoulder pain for a day or two from air left in the abdomen. The air can irritate the nerve that goes from the diaphragm to the shoulder.  You will be given pain medication as is necessary.  The intravenous and catheter will be removed.  Have someone available to take you home from the hospital. HOME CARE INSTRUCTIONS   Only take over-the-counter or prescription medicines for pain, discomfort, or fever as directed by your caregiver.  Do not take aspirin. It can cause bleeding.  Do not drive when taking pain medication.  Follow your caregiver's advice regarding diet, exercise, lifting, driving, and general activities.  You may resume your usual diet as directed and  allowed.  Get plenty of rest and sleep.  Do not douche, use tampons, or have sexual intercourse until your caregiver says it is okay.  Change your bandages (dressings) as directed.  Take your temperature twice a day and write it down.  Your caregiver may recommend showers instead of baths for a few weeks.  Do not drink alcohol until your caregiver says it is okay.  If you develop constipation, you may take a mild laxative with your caregiver's permission. Bran foods and drinking fluids helps with constipation problems.  Try to have someone home with you for a week or two to help with the household activities.  Make sure you and your family understands everything about your operation and recovery.  Do not sign any legal documents until you feel normal again.  Keep all your follow-up appointments. SEEK MEDICAL CARE IF:   There is swelling, redness, or increasing pain in the wound area.  Pus is coming from the wound.  You notice a bad smell from the wound or surgical dressing.  You  have pain, redness, or swelling from the intravenous site.  The wound is breaking open (the edges are not staying together).  You feel dizzy or feel like fainting.  You develop pain or bleeding when you urinate.  You develop diarrhea.  You develop nausea and vomiting.  You develop abnormal vaginal discharge.  You develop a rash.  You have any type of abnormal reaction or develop an allergy to your medication.  You need stronger pain medication for your pain. SEEK IMMEDIATE MEDICAL CARE IF:   You develop an unexplained temperature above 100 F (37.8 C).  You develop abdominal pain.  You develop chest pain.  You develop shortness of breath.  You pass out.  You develop pain, swelling, or redness of your leg.  You develop heavy vaginal bleeding with or without blood clots. Document Released: 12/30/2004 Document Revised: 03/24/2011 Document Reviewed: 05/26/2008 [facility] Patient  Information 2013 [place], [place].

## 2012-02-13 NOTE — Progress Notes (Signed)
Dr. Jayme Cloud aware of low K+. I-stat ordered for morning of procedure.

## 2012-02-17 ENCOUNTER — Encounter (HOSPITAL_COMMUNITY): Payer: Self-pay | Admitting: Obstetrics and Gynecology

## 2012-02-17 ENCOUNTER — Encounter (HOSPITAL_COMMUNITY): Payer: Self-pay | Admitting: Anesthesiology

## 2012-02-17 ENCOUNTER — Ambulatory Visit (HOSPITAL_COMMUNITY)
Admission: RE | Admit: 2012-02-17 | Discharge: 2012-02-17 | Disposition: A | Discharge status: Home / Self Care | Payer: MEDICAID | Source: Ambulatory Visit | Attending: Obstetrics and Gynecology | Admitting: Obstetrics and Gynecology

## 2012-02-17 ENCOUNTER — Ambulatory Visit (HOSPITAL_COMMUNITY): Payer: Self-pay | Admitting: Anesthesiology

## 2012-02-17 ENCOUNTER — Encounter (HOSPITAL_COMMUNITY)
Admission: RE | Disposition: A | Discharge status: Home / Self Care | Payer: Self-pay | Source: Ambulatory Visit | Attending: Obstetrics and Gynecology

## 2012-02-17 DIAGNOSIS — D279 Benign neoplasm of unspecified ovary: Secondary | ICD-10-CM | POA: Diagnosis present

## 2012-02-17 DIAGNOSIS — Z01810 Encounter for preprocedural cardiovascular examination: Secondary | ICD-10-CM | POA: Insufficient documentation

## 2012-02-17 DIAGNOSIS — Z01812 Encounter for preprocedural laboratory examination: Secondary | ICD-10-CM | POA: Insufficient documentation

## 2012-02-17 DIAGNOSIS — N839 Noninflammatory disorder of ovary, fallopian tube and broad ligament, unspecified: Secondary | ICD-10-CM | POA: Insufficient documentation

## 2012-02-17 DIAGNOSIS — I10 Essential (primary) hypertension: Secondary | ICD-10-CM | POA: Insufficient documentation

## 2012-02-17 HISTORY — PX: LAPAROSCOPIC SALPINGO OOPHERECTOMY: SHX5927

## 2012-02-17 LAB — POCT I-STAT 4, (NA,K, GLUC, HGB,HCT)
Glucose, Bld: 97 mg/dL (ref 70–99)
HCT: 39 % (ref 36.0–46.0)
Potassium: 3.3 mEq/L — ABNORMAL LOW (ref 3.5–5.1)

## 2012-02-17 SURGERY — SALPINGO-OOPHORECTOMY, LAPAROSCOPIC
Anesthesia: General | Site: Abdomen | Laterality: Bilateral | Wound class: Clean

## 2012-02-17 MED ORDER — ROCURONIUM BROMIDE 100 MG/10ML IV SOLN
INTRAVENOUS | Status: DC | PRN
  Administered 2012-02-17: 5 mg via INTRAVENOUS
  Administered 2012-02-17: 10 mg via INTRAVENOUS
  Administered 2012-02-17: 25 mg via INTRAVENOUS

## 2012-02-17 MED ORDER — ONDANSETRON HCL 4 MG/2ML IJ SOLN
INTRAMUSCULAR | Status: AC
  Filled 2012-02-17: qty 2

## 2012-02-17 MED ORDER — CEFAZOLIN SODIUM-DEXTROSE 2-3 GM-% IV SOLR
INTRAVENOUS | Status: DC | PRN
  Administered 2012-02-17: 2 g via INTRAVENOUS

## 2012-02-17 MED ORDER — PROPOFOL 10 MG/ML IV EMUL
INTRAVENOUS | Status: AC
  Filled 2012-02-17: qty 20

## 2012-02-17 MED ORDER — CEFAZOLIN SODIUM-DEXTROSE 2-3 GM-% IV SOLR: 2.0000 g | INTRAVENOUS | Status: DC

## 2012-02-17 MED ORDER — ROCURONIUM BROMIDE 50 MG/5ML IV SOLN
INTRAVENOUS | Status: AC
  Filled 2012-02-17: qty 1

## 2012-02-17 MED ORDER — ONDANSETRON HCL 4 MG/2ML IJ SOLN
4.0000 mg | Freq: Once | INTRAMUSCULAR | Status: AC | PRN
  Administered 2012-02-17: 4 mg via INTRAVENOUS

## 2012-02-17 MED ORDER — LIDOCAINE HCL (PF) 1 % IJ SOLN
INTRAMUSCULAR | Status: AC
  Filled 2012-02-17: qty 5

## 2012-02-17 MED ORDER — SUCCINYLCHOLINE CHLORIDE 20 MG/ML IJ SOLN
INTRAMUSCULAR | Status: DC | PRN
  Administered 2012-02-17: 160 mg via INTRAVENOUS

## 2012-02-17 MED ORDER — GLYCOPYRROLATE 0.2 MG/ML IJ SOLN
INTRAMUSCULAR | Status: AC
  Filled 2012-02-17: qty 3

## 2012-02-17 MED ORDER — OXYCODONE-ACETAMINOPHEN 5-325 MG PO TABS: 1.0000 | ORAL_TABLET | ORAL | Status: DC | PRN

## 2012-02-17 MED ORDER — FENTANYL CITRATE 0.05 MG/ML IJ SOLN
25.0000 ug | INTRAMUSCULAR | Status: DC | PRN
  Administered 2012-02-17 (×2): 50 ug via INTRAVENOUS

## 2012-02-17 MED ORDER — SODIUM CHLORIDE 0.9 % IR SOLN: Status: DC | PRN

## 2012-02-17 MED ORDER — BUPIVACAINE-EPINEPHRINE PF 0.5-1:200000 % IJ SOLN
INTRAMUSCULAR | Status: AC
  Filled 2012-02-17: qty 10

## 2012-02-17 MED ORDER — MIDAZOLAM HCL 2 MG/2ML IJ SOLN
INTRAMUSCULAR | Status: AC
  Filled 2012-02-17: qty 2

## 2012-02-17 MED ORDER — FENTANYL CITRATE 0.05 MG/ML IJ SOLN
INTRAMUSCULAR | Status: AC
  Filled 2012-02-17: qty 2

## 2012-02-17 MED ORDER — BUPIVACAINE-EPINEPHRINE (PF) 0.5% -1:200000 IJ SOLN
INTRAMUSCULAR | Status: DC | PRN
  Administered 2012-02-17: 20 mL

## 2012-02-17 MED ORDER — FENTANYL CITRATE 0.05 MG/ML IJ SOLN
INTRAMUSCULAR | Status: DC | PRN
  Administered 2012-02-17 (×2): 50 ug via INTRAVENOUS
  Administered 2012-02-17: 100 ug via INTRAVENOUS
  Administered 2012-02-17: 50 ug via INTRAVENOUS
  Administered 2012-02-17: 100 ug via INTRAVENOUS

## 2012-02-17 MED ORDER — LIDOCAINE HCL 1 % IJ SOLN
INTRAMUSCULAR | Status: DC | PRN
  Administered 2012-02-17: 50 mg via INTRADERMAL

## 2012-02-17 MED ORDER — CEFAZOLIN SODIUM-DEXTROSE 2-3 GM-% IV SOLR
INTRAVENOUS | Status: AC
  Filled 2012-02-17: qty 50

## 2012-02-17 MED ORDER — MIDAZOLAM HCL 5 MG/5ML IJ SOLN
INTRAMUSCULAR | Status: DC | PRN
  Administered 2012-02-17: 2 mg via INTRAVENOUS

## 2012-02-17 MED ORDER — SODIUM CHLORIDE 0.9 % IR SOLN
Status: DC | PRN
  Administered 2012-02-17: 3000 mL

## 2012-02-17 MED ORDER — 0.9 % SODIUM CHLORIDE (POUR BTL) OPTIME
TOPICAL | Status: DC | PRN
  Administered 2012-02-17: 1000 mL

## 2012-02-17 MED ORDER — LACTATED RINGERS IV SOLN
INTRAVENOUS | Status: DC
  Administered 2012-02-17: 09:00:00 via INTRAVENOUS
  Administered 2012-02-17: 1000 mL via INTRAVENOUS

## 2012-02-17 MED ORDER — GLYCOPYRROLATE 0.2 MG/ML IJ SOLN
INTRAMUSCULAR | Status: DC | PRN
  Administered 2012-02-17: 0.2 mg via INTRAVENOUS
  Administered 2012-02-17: 0.4 mg via INTRAVENOUS

## 2012-02-17 MED ORDER — PROPOFOL 10 MG/ML IV BOLUS
INTRAVENOUS | Status: DC | PRN
  Administered 2012-02-17: 160 mg via INTRAVENOUS

## 2012-02-17 MED ORDER — ONDANSETRON HCL 4 MG/2ML IJ SOLN
4.0000 mg | Freq: Once | INTRAMUSCULAR | Status: AC
  Administered 2012-02-17: 4 mg via INTRAVENOUS

## 2012-02-17 MED ORDER — MIDAZOLAM HCL 2 MG/2ML IJ SOLN
1.0000 mg | INTRAMUSCULAR | Status: DC | PRN
  Administered 2012-02-17: 2 mg via INTRAVENOUS

## 2012-02-17 MED ORDER — FENTANYL CITRATE 0.05 MG/ML IJ SOLN
INTRAMUSCULAR | Status: AC
  Filled 2012-02-17: qty 5

## 2012-02-17 MED ORDER — NEOSTIGMINE METHYLSULFATE 1 MG/ML IJ SOLN
INTRAMUSCULAR | Status: DC | PRN
  Administered 2012-02-17: 3 mg via INTRAVENOUS

## 2012-02-17 MED ORDER — BUPIVACAINE HCL (PF) 0.5 % IJ SOLN
INTRAMUSCULAR | Status: AC
  Filled 2012-02-17: qty 30

## 2012-02-17 SURGICAL SUPPLY — 52 items
APPLIER CLIP UNV 5X34 EPIX (ENDOMECHANICALS) ×1 IMPLANT
APR XCLPCLP 20M/L UNV 34X5 (ENDOMECHANICALS) ×1
BAG HAMPER (MISCELLANEOUS) ×2 IMPLANT
BLADE SURG SZ11 CARB STEEL (BLADE) ×2 IMPLANT
CLOTH BEACON ORANGE TIMEOUT ST (SAFETY) ×2 IMPLANT
COVER LIGHT HANDLE STERIS (MISCELLANEOUS) ×4 IMPLANT
COVER MAYO STAND XLG (DRAPE) ×1 IMPLANT
DRESSING COVERLET 3X1 FLEXIBLE (GAUZE/BANDAGES/DRESSINGS) ×2 IMPLANT
DRSG TEGADERM 2-3/8X2-3/4 SM (GAUZE/BANDAGES/DRESSINGS) ×1 IMPLANT
DURAPREP 26ML APPLICATOR (WOUND CARE) ×1 IMPLANT
ELECT REM PT RETURN 9FT ADLT (ELECTROSURGICAL) ×2
ELECTRODE REM PT RTRN 9FT ADLT (ELECTROSURGICAL) ×1 IMPLANT
GLOVE BIOGEL PI IND STRL 7.0 (GLOVE) IMPLANT
GLOVE BIOGEL PI IND STRL 7.5 (GLOVE) IMPLANT
GLOVE BIOGEL PI INDICATOR 7.0 (GLOVE) ×1
GLOVE BIOGEL PI INDICATOR 7.5 (GLOVE) ×1
GLOVE ECLIPSE 7.0 STRL STRAW (GLOVE) ×2 IMPLANT
GLOVE ECLIPSE 9.0 STRL (GLOVE) ×3 IMPLANT
GLOVE EXAM NITRILE MD LF STRL (GLOVE) ×2 IMPLANT
GLOVE INDICATOR STER SZ 9 (GLOVE) ×2 IMPLANT
GOWN STRL REIN 3XL LVL4 (GOWN DISPOSABLE) ×2 IMPLANT
GOWN STRL REIN XL XLG (GOWN DISPOSABLE) ×3 IMPLANT
INST SET LAPROSCOPIC GYN AP (KITS) ×2 IMPLANT
IV NS IRRIG 3000ML ARTHROMATIC (IV SOLUTION) ×1 IMPLANT
KIT ROOM TURNOVER AP CYSTO (KITS) ×2 IMPLANT
MANIFOLD NEPTUNE II (INSTRUMENTS) ×2 IMPLANT
NDL HYPO 18GX1.5 BLUNT FILL (NEEDLE) ×1 IMPLANT
NDL HYPO 25X1 1.5 SAFETY (NEEDLE) IMPLANT
NDL INSUFFLATION 14GA 120MM (NEEDLE) ×1 IMPLANT
NEEDLE HYPO 18GX1.5 BLUNT FILL (NEEDLE) ×2 IMPLANT
NEEDLE HYPO 25X1 1.5 SAFETY (NEEDLE) ×2 IMPLANT
NEEDLE INSUFFLATION 14GA 120MM (NEEDLE) ×2 IMPLANT
PACK PERI GYN (CUSTOM PROCEDURE TRAY) ×2 IMPLANT
PAD ARMBOARD 7.5X6 YLW CONV (MISCELLANEOUS) ×2 IMPLANT
POUCH ENDO CATCH II 15MM (MISCELLANEOUS) ×1 IMPLANT
SCALPEL HARMONIC ACE (MISCELLANEOUS) ×1 IMPLANT
SET BASIN LINEN APH (SET/KITS/TRAYS/PACK) ×2 IMPLANT
SET TUBE IRRIG SUCTION NO TIP (IRRIGATION / IRRIGATOR) ×2 IMPLANT
SOL PREP PROV IODINE SCRUB 4OZ (MISCELLANEOUS) ×2 IMPLANT
SOLUTION ANTI FOG 6CC (MISCELLANEOUS) ×2 IMPLANT
SPONGE GAUZE 2X2 8PLY STRL LF (GAUZE/BANDAGES/DRESSINGS) ×1 IMPLANT
STRIP CLOSURE SKIN 1/4X3 (GAUZE/BANDAGES/DRESSINGS) ×2 IMPLANT
SUT VIC AB 4-0 PS2 27 (SUTURE) ×2 IMPLANT
SUT VICRYL 0 UR6 27IN ABS (SUTURE) ×2 IMPLANT
SYR BULB IRRIGATION 50ML (SYRINGE) ×1 IMPLANT
SYR CONTROL 10ML LL (SYRINGE) ×2 IMPLANT
SYRINGE 10CC LL (SYRINGE) ×2 IMPLANT
TRAY FOLEY CATH 16FR SILVER (SET/KITS/TRAYS/PACK) ×2 IMPLANT
TROCAR Z-THAD FIOS HNDL 12X100 (TROCAR) ×2 IMPLANT
TROCAR Z-THRD FIOS HNDL 11X100 (TROCAR) ×2 IMPLANT
TROCAR Z-THREAD FIOS 5X100MM (TROCAR) ×2 IMPLANT
WARMER LAPAROSCOPE (MISCELLANEOUS) ×2 IMPLANT

## 2012-02-17 NOTE — Anesthesia Postprocedure Evaluation (Signed)
  Anesthesia Post-op Note  Patient: [name]  Procedure(s) Performed: Procedure(s) (LRB) with comments: LAPAROSCOPIC SALPINGO OOPHORECTOMY (Bilateral)  Patient Location: PACU  Anesthesia Type:General  Level of Consciousness: awake, alert , oriented and patient cooperative  Airway and Oxygen Therapy: Patient Spontanous Breathing  Post-op Pain: 4 /10, moderate  Post-op Assessment: Post-op Vital signs reviewed, Patient's Cardiovascular Status Stable, PATIENT'S CARDIOVASCULAR STATUS UNSTABLE, Patent Airway and Pain level controlled  Post-op Vital Signs: Reviewed and stable  Complications: No apparent anesthesia complications

## 2012-02-17 NOTE — Anesthesia Preprocedure Evaluation (Signed)
Anesthesia Evaluation  Patient identified by MRN, date of birth, ID band Patient awake    Reviewed: Allergy & Precautions, H&P , NPO status , Patient's Chart, lab work & pertinent test results  Airway Mallampati: II TM Distance: >3 FB     Dental  (+) Teeth Intact   Pulmonary shortness of breath,  breath sounds clear to auscultation        Cardiovascular hypertension, Pt. on medications Rhythm:Regular Rate:Normal     Neuro/Psych PSYCHIATRIC DISORDERS Anxiety Depression    GI/Hepatic GERD-  Medicated and Controlled,  Endo/Other    Renal/GU      Musculoskeletal  (+) Arthritis - (chronic LBP, L leg numbness), Fibromyalgia -  Abdominal   Peds  Hematology   Anesthesia Other Findings   Reproductive/Obstetrics                           Anesthesia Physical Anesthesia Plan  ASA: II  Anesthesia Plan: General   Post-op Pain Management:    Induction: Intravenous, Rapid sequence and Cricoid pressure planned  Airway Management Planned: Oral ETT  Additional Equipment:   Intra-op Plan:   Post-operative Plan: Extubation in OR  Informed Consent: I have reviewed the patients History and Physical, chart, labs and discussed the procedure including the risks, benefits and alternatives for the proposed anesthesia with the patient or authorized representative who has indicated his/her understanding and acceptance.     Plan Discussed with:   Anesthesia Plan Comments:         Anesthesia Quick Evaluation

## 2012-02-17 NOTE — Progress Notes (Signed)
Needs to void. Refuses bedpan. Wants to get up to BR. States pain med effective.

## 2012-02-17 NOTE — Brief Op Note (Signed)
02/17/2012  10:02 AM  PATIENT:  [name]  62 y.o. female  PRE-OPERATIVE DIAGNOSIS:  ovarian mass POST-OPERATIVE DIAGNOSIS:  bilateral ovarian masses Bilateral Cystadenofibroma PROCEDURE:  Procedure(s) (LRB) with comments: LAPAROSCOPIC SALPINGO OOPHORECTOMY (Bilateral)  SURGEON:  Surgeon(s) and Role:    * Tilda Burrow, MD - Primary PHYSICIAN ASSISTANT:  ASSISTANTS: Kendrick, CST   ANESTHESIA:   local and general EBL:  Total I/O In: 1100 [I.V.:1100] Out: 125 [Urine:100; Blood:25]  BLOOD ADMINISTERED:none DRAINS: none  LOCAL MEDICATIONS USED:  MARCAINE    and Amount: 20 ml  SPECIMEN:  Source of Specimen:  bilateral tubes and ovaries DISPOSITION OF SPECIMEN:  PATHOLOGY COUNTS:  YES  TOURNIQUET:  * No tourniquets in log * DICTATION: .Dragon Dictation Patient was taken to the operating room prepped and draped for combined abdominal and vaginal procedure with single-tooth tenaculum attached the vaginal cuff for orientation and manipulation of the cuff, with Foley catheter in place. Timeout was conducted. Ancef 2 g administered timeout confirmed by surgical team, and draping performed. An infraumbilical vertical 1 cm skin incision was made as well as a transverse suprapubic 2 cm incision and a right lower quadrant 1 cm incision. Veress needle was introduced through the umbilicus while droplet technique confirming intraperitoneal location and direct insertion with a 10 mm trocar performed. Visualization the abdomen and pelvis was performed and photos taken. The left tube and ovary were identified had multiple cystic structures over its surfaces. The omentum was normal the liver was normal. The right tube and ovary filled the cul-de-sac, and a. Smooth cystic in surface structure there was no suspicion of malignant characteristics to the abdominal tissues. Suprapubic trocar was inserted 5 mm and right lower quadrant 5 mm trocar placed. Attention was directed left tube which could be  identified. The cyst and peritoneum was opened in the round ligament remnants on the left, and the ureter identified as being well out of the surgical field. The left IP ligament was then desiccated with the Harmonic Scalpel and then transected, staying close to the ovary well away from  retro- peritoneal structures. Specimen was cut free and placed in the lateral pelvis on the left. On the right side the retroperitoneum was entered similarly behind the round ligament, the right IP ligament confirmed as being isolated and well away from the ureter and adjacent structures. Again the harmonic scalpel was used to transect the right IP ligament and peel the ovary away from the right sidewall. There were some adhesions just anterior and medial to the right round ligament which included a vein that required a Hemoclip for hemostasis. The remainder of the dissection was satisfactory and hemostatic. Pelvis was irrigated. The suprapubic trocar site was opened wide and 2 proximal a stage centimeter wide fascial opening and the 15 mm trocar inserted between the rectus muscles in the Endo Catch bag used to place both adnexal structures into the bag which was pulled into the incision. The cystic structures within decompressed within the bag being particularly careful to have no contamination of the pelvis with the decompressed fluid. Bag was taken out of the abdomen, the 10 mm trocar placed through the large site and was laparotomy tapes placed at the level of the fascia to allow the peritoneum to be recovered. Pelvis was inspected and confirmed as hemostatic. Specimen was sent for frozen section. After irrigation of the pelvis confirming hemostasis, the procedure was considered satisfactorily completed and the abdomen deflated with approximately 250 cc of saline left in the abdominal  cavity. The fascial closure of the suprapubic site was performed with continuous running 0 Vicryl, and the instruments removed from all 3 trocar  sites, the  infraumbilical site was closed with fascial layer as well there Vicryl. All 3 sites were irrigated with local anesthesia, and then subcuticular 4-0 Vicryl skin closure performed. Frozen section returned benign cystadenofibroma. Patient to recovery room in stable condition sponge and needle counts were correct.  PLAN OF CARE: Discharge to home after PACU PATIENT DISPOSITION:  PACU - hemodynamically stable.  Delay start of Pharmacological VTE agent (>24hrs) due to surgical blood loss or risk of bleeding: not applicable

## 2012-02-17 NOTE — Progress Notes (Signed)
Awake. Needs of void. Placed on bedpan. Unable to void.

## 2012-02-17 NOTE — Anesthesia Procedure Notes (Signed)
Procedure Name: Intubation Date/Time: 02/17/2012 7:51 AM Performed by: Despina Hidden Pre-anesthesia Checklist: Emergency Drugs available, Suction available, Patient being monitored and Patient identified Patient Re-evaluated:Patient Re-evaluated prior to inductionOxygen Delivery Method: Circle system utilized Preoxygenation: Pre-oxygenation with 100% oxygen Intubation Type: IV induction, Cricoid Pressure applied and Rapid sequence Ventilation: Mask ventilation without difficulty Laryngoscope Size: Mac and 3 Grade View: Grade I Tube type: Oral Tube size: 7.0 mm Number of attempts: 1 Airway Equipment and Method: Stylet Placement Confirmation: ETT inserted through vocal cords under direct vision,  breath sounds checked- equal and bilateral and positive ETCO2 Secured at: 20 cm Tube secured with: Tape Dental Injury: Teeth and Oropharynx as per pre-operative assessment

## 2012-02-17 NOTE — H&P (Signed)
[name] is an 62 y.o. female referred by the [facility] for a 8 x 6 cm cystic Right ovarian mass with normal Ca-125. With no evidence  Of ascites or adenopathy. She is admitted for Bilateral Salpingoophorectomy, with laparoscopic approach planned. The likelihood of benign tumor is high, though pt is informed of the potential risk of malignant etiology.  She also has significant left leg sciatica, and recent MRI back shows narrowing of foramina at L-4-5.     Pertinent Gynecological History: Menses: distant hysterectomy Bleeding: none Contraception: status post hysterectomy DES exposure: unknown Blood transfusions: none Sexually transmitted diseases: no past history Previous GYN Procedures: hyst.  Last mammogram:  Date:  Last pap:  Date:  OB History: G4, P3013    Menstrual History: Menarche age: 28 No LMP recorded. Patient has had a hysterectomy.    Past Medical History  Diagnosis Date  . Hypertension   . Cystitides, interstitial, chronic   . Fibromyalgia   . Urinary tract infection   . Urinary disorder     bladder is in sling per pt  . High cholesterol   . Back pain   . Anxiety   . Depression   . Shortness of breath   . GERD (gastroesophageal reflux disease)   . Vertigo     Past Surgical History  Procedure Date  . Abdominal hysterectomy   . Lasik   . Toe surgery   . Bladder tack     No family history on file.  Social History:  reports that she has never smoked. She does not have any smokeless tobacco history on file. She reports that she drinks alcohol. She reports that she does not use illicit drugs.  Allergies:  Allergies  Allergen Reactions  . Ciprofloxacin Diarrhea    No prescriptions prior to admission    Review of Systems  Constitutional: Negative for fever, chills and weight loss.  HENT: Negative.   Gastrointestinal: Negative for abdominal pain.       No bloating, changes in abd girth.  Musculoskeletal:       Recent significant pain in  left buttock, extending into left leg, consistent with sciatica, evaluated with MRI which shows a "Broad-based disc herniation at L-4-5, asymmetric to the left, with bilateral moderate facet hypertrophy. This results in mild to moderate lateral recess narrowing, worse on the left. Moderate left and mild right foraminal narrowing are present."  Skin: Negative.     There were no vitals taken for this visit. Physical Exam  Constitutional: She is oriented to person, place, and time. She appears well-developed and well-nourished.       VS WT 192.2 pounds, BP's 138/ 58, varying to 180/100 when in pain from back.and left leg.  HENT:  Head: Normocephalic and atraumatic.  Eyes: Pupils are equal, round, and reactive to light.  Neck: Normal range of motion. Neck supple. No thyromegaly present.  Cardiovascular: Normal rate and regular rhythm.   Respiratory: Effort normal and breath sounds normal.  GI: Soft. She exhibits no distension and no mass.  Genitourinary: Vagina normal.       Uterus absent surgically, Bimanual reveals a smooth, slightly mobile firm mass to right of vaginal cuff, confirmed by CT as an 8 x 6 cm complex cystic mass within the left ovary, with multiple internal septations. Left ovary small, with small cystic areas.  Neurological: She is alert and oriented to person, place, and time.  Skin: Skin is warm and dry.  Psychiatric: She has a normal mood and  affect. Her behavior is normal. Judgment and thought content normal.   CMP     Component Value Date/Time   NA 134* 02/12/2012 1510   K 3.2* 02/12/2012 1510   CL 94* 02/12/2012 1510   CO2 31 02/12/2012 1510   GLUCOSE 112* 02/12/2012 1510   BUN 13 02/12/2012 1510   CREATININE 0.80 02/12/2012 1510   CALCIUM 9.7 02/12/2012 1510   PROT 6.8 01/11/2012 1555   ALBUMIN 3.6 01/11/2012 1555   AST 13 01/11/2012 1555   ALT 14 01/11/2012 1555   ALKPHOS 48 01/11/2012 1555   BILITOT 0.3 01/11/2012 1555   GFRNONAA 77* 02/12/2012 1510   GFRAA 89*  02/12/2012 1510   CBC    Component Value Date/Time   WBC 9.4 02/12/2012 1510   RBC 4.26 02/12/2012 1510   HGB 13.2 02/12/2012 1510   HCT 37.8 02/12/2012 1510   PLT 268 02/12/2012 1510   MCV 88.7 02/12/2012 1510   MCH 31.0 02/12/2012 1510   MCHC 34.9 02/12/2012 1510   RDW 12.6 02/12/2012 1510   LYMPHSABS 2.6 01/11/2012 1555   MONOABS 0.5 01/11/2012 1555   EOSABS 0.1 01/11/2012 1555   BASOSABS 0.1 01/11/2012 1555     No results found for this or any previous visit (from the past 24 hour(s)).  No results found.  Assessment/Plan: Right ovarian mass with normal Ca-125. Sciatica of Left leg S/p Hysterectomy and bladder tack Plan: Bowel Prep done           Bilateral salpingoophorectomy, laparoscopic approach.  Risks of procedure reviewed, including potential for malignant lesion , injury to adjacent organs such as ureters, exacerbation of sciatica, or need to convert to laparotomy, depending on clinical findings.  [name] V 02/17/2012, 2:14 AM

## 2012-02-17 NOTE — Op Note (Signed)
See operative details included in the brief operative note

## 2012-02-17 NOTE — Transfer of Care (Signed)
Immediate Anesthesia Transfer of Care Note  Patient: [name]  Procedure(s) Performed: Procedure(s) (LRB) with comments: LAPAROSCOPIC SALPINGO OOPHORECTOMY (Bilateral)  Patient Location: PACU  Anesthesia Type:General  Level of Consciousness: awake and patient cooperative  Airway & Oxygen Therapy: Patient Spontanous Breathing and Patient connected to face mask oxygen  Post-op Assessment: Report given to PACU RN, Post -op Vital signs reviewed and stable and Patient moving all extremities  Post vital signs: Reviewed and stable  Complications: No apparent anesthesia complications

## 2012-02-18 ENCOUNTER — Encounter (HOSPITAL_COMMUNITY): Payer: Self-pay | Admitting: Obstetrics and Gynecology

## 2012-03-05 ENCOUNTER — Emergency Department (HOSPITAL_COMMUNITY)
Admission: EM | Admit: 2012-03-05 | Discharge: 2012-03-05 | Disposition: A | Discharge status: Home / Self Care | Payer: Self-pay | Attending: Emergency Medicine | Admitting: Emergency Medicine

## 2012-03-05 ENCOUNTER — Encounter (HOSPITAL_COMMUNITY): Payer: Self-pay

## 2012-03-05 ENCOUNTER — Emergency Department (HOSPITAL_COMMUNITY): Payer: Self-pay

## 2012-03-05 DIAGNOSIS — Z79899 Other long term (current) drug therapy: Secondary | ICD-10-CM | POA: Insufficient documentation

## 2012-03-05 DIAGNOSIS — Z87440 Personal history of urinary (tract) infections: Secondary | ICD-10-CM | POA: Insufficient documentation

## 2012-03-05 DIAGNOSIS — Z8669 Personal history of other diseases of the nervous system and sense organs: Secondary | ICD-10-CM | POA: Insufficient documentation

## 2012-03-05 DIAGNOSIS — K219 Gastro-esophageal reflux disease without esophagitis: Secondary | ICD-10-CM | POA: Insufficient documentation

## 2012-03-05 DIAGNOSIS — F329 Major depressive disorder, single episode, unspecified: Secondary | ICD-10-CM | POA: Insufficient documentation

## 2012-03-05 DIAGNOSIS — F3289 Other specified depressive episodes: Secondary | ICD-10-CM | POA: Insufficient documentation

## 2012-03-05 DIAGNOSIS — I10 Essential (primary) hypertension: Secondary | ICD-10-CM | POA: Insufficient documentation

## 2012-03-05 DIAGNOSIS — R079 Chest pain, unspecified: Secondary | ICD-10-CM | POA: Insufficient documentation

## 2012-03-05 DIAGNOSIS — M549 Dorsalgia, unspecified: Secondary | ICD-10-CM | POA: Insufficient documentation

## 2012-03-05 DIAGNOSIS — R0602 Shortness of breath: Secondary | ICD-10-CM | POA: Insufficient documentation

## 2012-03-05 DIAGNOSIS — Z87448 Personal history of other diseases of urinary system: Secondary | ICD-10-CM | POA: Insufficient documentation

## 2012-03-05 DIAGNOSIS — F411 Generalized anxiety disorder: Secondary | ICD-10-CM | POA: Insufficient documentation

## 2012-03-05 DIAGNOSIS — E7800 Pure hypercholesterolemia, unspecified: Secondary | ICD-10-CM | POA: Insufficient documentation

## 2012-03-05 DIAGNOSIS — R110 Nausea: Secondary | ICD-10-CM | POA: Insufficient documentation

## 2012-03-05 LAB — BASIC METABOLIC PANEL
CO2: 29 mEq/L (ref 19–32)
Chloride: 97 mEq/L (ref 96–112)
Creatinine, Ser: 0.66 mg/dL (ref 0.50–1.10)

## 2012-03-05 LAB — CBC WITH DIFFERENTIAL/PLATELET
Basophils Absolute: 0.1 10*3/uL (ref 0.0–0.1)
HCT: 34.1 % — ABNORMAL LOW (ref 36.0–46.0)
Hemoglobin: 11.7 g/dL — ABNORMAL LOW (ref 12.0–15.0)
Lymphocytes Relative: 29 % (ref 12–46)
Lymphs Abs: 2.1 10*3/uL (ref 0.7–4.0)
Monocytes Absolute: 0.4 10*3/uL (ref 0.1–1.0)
Monocytes Relative: 6 % (ref 3–12)
Neutro Abs: 4.3 10*3/uL (ref 1.7–7.7)
RBC: 3.85 MIL/uL — ABNORMAL LOW (ref 3.87–5.11)
RDW: 12.3 % (ref 11.5–15.5)
WBC: 7.4 10*3/uL (ref 4.0–10.5)

## 2012-03-05 LAB — D-DIMER, QUANTITATIVE: D-Dimer, Quant: 0.46 ug/mL-FEU (ref 0.00–0.48)

## 2012-03-05 LAB — TROPONIN I: Troponin I: <0.3 ng/mL (ref <0.30)

## 2012-03-05 MED ORDER — CYCLOBENZAPRINE HCL 10 MG PO TABS
10.0000 mg | ORAL_TABLET | Freq: Once | ORAL | Status: AC
  Administered 2012-03-05: 10 mg via ORAL
  Filled 2012-03-05: qty 1

## 2012-03-05 NOTE — ED Notes (Signed)
Pt reports had surgery 2 weeks ago to remove ovaries and tubes due to a tumor on her ovary.  Pt reports was in Dr. Rayna Sexton office this morning and had severe chest pain radiating through to her upper back.  Reports pain lasted approx .  Dr. Emelda Fear sent pt here for evaluation. Pt denies any pain in chest or back at this time.

## 2012-03-05 NOTE — ED Notes (Signed)
Per doctor's approval - coke given to patient.

## 2012-03-05 NOTE — ED Provider Notes (Signed)
History     This chart was scribed for Shelda Jakes, MD, MD by Smitty Pluck, ED Scribe. The patient was seen in room APA02/APA02 and the patient's care was started at 10:29 AM.   CSN: [id number]  Arrival date & time 03/05/12  [id number]      Chief Complaint  Patient presents with  . Chest Pain    Patient is a 62 y.o. female presenting with chest pain. The history is provided by the patient and medical records. No language interpreter was used.  Chest Pain Pain location:  Substernal area Pain radiates to:  Upper back Pain radiates to the back: yes   Pain severity:  Moderate Duration:  15 minutes Timing:  Intermittent Progression:  Resolved Chronicity:  Recurrent Relieved by:  None tried Worsened by:  Nothing tried Ineffective treatments:  None tried Associated symptoms: back pain, nausea and shortness of breath   Associated symptoms: no abdominal pain, no cough, no diaphoresis, no fever, no headache and not vomiting    [name] is a 62 y.o. female who presents to the Emergency Department complaining of sudden, intermittent, severe, substernal chest pain that radiated to upper back onset today while in Dr. Rayna Sexton office. The pain is rated at 8-10/10. Pt reports that episode lasted 10-15 minutes max. She describes the pain as pressure. Reports similar pain in the past but pain subsided on its own. Pt reports she does not have any current pain. Denies taking any medication PTA. She reports having SOB. Pt reports that she had surgery 2 weeks ago to remove ovaries ad tubes due to a tumor on her ovary. She reports that she has ongoing lower back pain and left leg pain but denies swelling in bilateral legs. Pt denies fever, chills, nausea, vomiting, diarrhea, weakness, cough and any other pain.   PCP is Dr. Daphene Jaeger   Past Medical History  Diagnosis Date  . Hypertension   . Cystitides, interstitial, chronic   . Fibromyalgia   . Urinary tract infection   . Urinary disorder      bladder is in sling per pt  . High cholesterol   . Back pain   . Anxiety   . Depression   . Shortness of breath   . GERD (gastroesophageal reflux disease)   . Vertigo     Past Surgical History  Procedure Laterality Date  . Abdominal hysterectomy  1997    [place] , [facility]  . Dewaine Conger    . Toe surgery    . Bladder tack    . Laparoscopic salpingo oopherectomy  02/17/2012    Procedure: LAPAROSCOPIC SALPINGO OOPHORECTOMY;  Surgeon: Tilda Burrow, MD;  Location: [facility];  Service: Gynecology;  Laterality: Bilateral;    No family history on file.  History  Substance Use Topics  . Smoking status: Never Smoker   . Smokeless tobacco: Not on file  . Alcohol Use: Yes     Comment: occassionaly    OB History   Grav Para Term Preterm Abortions TAB SAB Ect Mult Living                  Review of Systems  Constitutional: Negative for fever, chills and diaphoresis.  HENT: Negative for congestion, sore throat and rhinorrhea.   Eyes: Negative for visual disturbance.  Respiratory: Positive for shortness of breath. Negative for cough.   Cardiovascular: Positive for chest pain.  Gastrointestinal: Positive for nausea. Negative for vomiting, abdominal pain and diarrhea.  Genitourinary: Negative for dysuria.  Musculoskeletal: Positive for back pain.  Skin: Negative for rash.  Neurological: Negative for headaches.  Hematological: Does not bruise/bleed easily.  All other systems reviewed and are negative.    Allergies  Ciprofloxacin  Home Medications   Current Outpatient Rx  Name  Route  Sig  Dispense  Refill  . ALPRAZolam (XANAX) 0.5 MG tablet   Oral   Take 0.5 mg by mouth 2 (two) times daily as needed for anxiety.          Marland Kitchen amLODipine (NORVASC) 5 MG tablet   Oral   Take 5 mg by mouth daily.         . citalopram (CELEXA) 40 MG tablet   Oral   Take 40 mg by mouth daily.         . cloNIDine (CATAPRES) 0.1 MG tablet   Oral   Take 0.1 mg by mouth 2 (two) times  daily.          . cyclobenzaprine (FLEXERIL) 10 MG tablet   Oral   Take 1 tablet (10 mg total) by mouth 3 (three) times daily as needed for muscle spasms.   21 tablet   0   . DULoxetine (CYMBALTA) 60 MG capsule   Oral   Take 60 mg by mouth daily.         Marland Kitchen estradiol (ESTRACE) 2 MG tablet   Oral   Take 2 mg by mouth daily.         Marland Kitchen gabapentin (NEURONTIN) 400 MG capsule   Oral   Take 400 mg by mouth 2 (two) times daily. Sometimes patient takes 3 times daily.         Marland Kitchen HYDROcodone-acetaminophen (NORCO/VICODIN) 5-325 MG per tablet   Oral   Take 1 tablet by mouth every 6 (six) hours as needed for pain.         Marland Kitchen ibuprofen (ADVIL,MOTRIN) 200 MG tablet   Oral   Take 800 mg by mouth every 6 (six) hours as needed. Pain         . lisinopril-hydrochlorothiazide (PRINZIDE,ZESTORETIC) 20-12.5 MG per tablet   Oral   Take 1 tablet by mouth 2 (two) times daily.         . metoprolol (LOPRESSOR) 100 MG tablet   Oral   Take 100 mg by mouth 2 (two) times daily.          . pravastatin (PRAVACHOL) 20 MG tablet   Oral   Take 20 mg by mouth at bedtime.         . cetirizine (ZYRTEC) 10 MG tablet   Oral   Take 10 mg by mouth daily as needed. Allergies         . Methenamine-Sodium Salicylate (CYSTEX) 162-162.5 MG TABS   Oral   Take 2 tablets by mouth daily as needed. For urinary pain         . OVER THE COUNTER MEDICATION   Oral   Take 1 packet by mouth daily. Isotonic Calcium         . oxyCODONE-acetaminophen (PERCOCET) 5-325 MG per tablet   Oral   Take 2 tablets by mouth every 4 (four) hours as needed for pain.   20 tablet   0   . promethazine (PHENERGAN) 25 MG tablet   Oral   Take 1 tablet (25 mg total) by mouth every 6 (six) hours as needed for nausea.   20 tablet   0     BP 152/78  Pulse 56  Temp(Src)  97.7 F (36.5 C) (Oral)  Resp 18  Ht 5\' 5"  (1.651 m)  Wt 193 lb (87.544 kg)  BMI 32.12 kg/m2  SpO2 96%  Physical Exam  Nursing note and  vitals reviewed. Constitutional: She is oriented to person, place, and time. She appears well-developed and well-nourished. No distress.  HENT:  Head: Normocephalic and atraumatic.  Eyes: EOM are normal. Pupils are equal, round, and reactive to light.  Neck: Normal range of motion. Neck supple. No tracheal deviation present.  Cardiovascular: Normal rate, regular rhythm and normal heart sounds.   No murmur heard. Pulmonary/Chest: Effort normal and breath sounds normal. No respiratory distress. She has no wheezes. She has no rales.  Abdominal: Soft. Bowel sounds are normal. She exhibits no distension. There is no tenderness. There is no rebound and no guarding.  Musculoskeletal: Normal range of motion. She exhibits no edema.  Neurological: She is alert and oriented to person, place, and time.  Skin: Skin is warm and dry.  Psychiatric: She has a normal mood and affect. Her behavior is normal.    ED Course  Procedures (including critical care time) DIAGNOSTIC STUDIES: Oxygen Saturation is 96% on room air, adequate by my interpretation.    COORDINATION OF CARE: 10:33 AM Discussed ED treatment with pt and pt agrees.     Labs Reviewed  CBC WITH DIFFERENTIAL - Abnormal; Notable for the following:    RBC 3.85 (*)    Hemoglobin 11.7 (*)    HCT 34.1 (*)    Basophils Relative 2 (*)    All other components within normal limits  TROPONIN I  BASIC METABOLIC PANEL  D-DIMER, QUANTITATIVE    Results for orders placed during the hospital encounter of 03/05/12  TROPONIN I      Result Value Range   Troponin I <0.30  <0.30 ng/mL  CBC WITH DIFFERENTIAL      Result Value Range   WBC 7.4  4.0 - 10.5 K/uL   RBC 3.85 (*) 3.87 - 5.11 MIL/uL   Hemoglobin 11.7 (*) 12.0 - 15.0 g/dL   HCT 16.1 (*) 09.6 - 04.5 %   MCV 88.6  78.0 - 100.0 fL   MCH 30.4  26.0 - 34.0 pg   MCHC 34.3  30.0 - 36.0 g/dL   RDW 40.9  81.1 - 91.4 %   Platelets 294  150 - 400 K/uL   Neutrophils Relative 59  43 - 77 %    Neutro Abs 4.3  1.7 - 7.7 K/uL   Lymphocytes Relative 29  12 - 46 %   Lymphs Abs 2.1  0.7 - 4.0 K/uL   Monocytes Relative 6  3 - 12 %   Monocytes Absolute 0.4  0.1 - 1.0 K/uL   Eosinophils Relative 5  0 - 5 %   Eosinophils Absolute 0.4  0.0 - 0.7 K/uL   Basophils Relative 2 (*) 0 - 1 %   Basophils Absolute 0.1  0.0 - 0.1 K/uL  BASIC METABOLIC PANEL      Result Value Range   Sodium 137  135 - 145 mEq/L   Potassium 3.6  3.5 - 5.1 mEq/L   Chloride 97  96 - 112 mEq/L   CO2 29  19 - 32 mEq/L   Glucose, Bld 94  70 - 99 mg/dL   BUN 14  6 - 23 mg/dL   Creatinine, Ser 7.82  0.50 - 1.10 mg/dL   Calcium 9.3  8.4 - 95.6 mg/dL   GFR calc non Af  Amer >90  >90 mL/min   GFR calc Af Amer >90  >90 mL/min  D-DIMER, QUANTITATIVE      Result Value Range   D-Dimer, Quant 0.46  0.00 - 0.48 ug/mL-FEU     Dg Chest 2 View  03/05/2012  *RADIOLOGY REPORT*  Clinical Data: Chest pain and shortness of breath.  CHEST - 2 VIEW  Comparison: PA and lateral chest 01/05/2011.  Findings: Lungs are clear.  Heart size is normal.  No pneumothorax or pleural effusion.  No focal bony abnormality is identified.  IMPRESSION: Negative chest.   Original Report Authenticated By: Holley Dexter, M.D.     Date: 03/05/2012  Rate: 57  Rhythm: sinus bradycardia  QRS Axis: normal  Intervals: normal  ST/T Wave abnormalities: normal  Conduction Disutrbances:first-degree A-V block   Narrative Interpretation:   Old EKG Reviewed: none available  Results for orders placed during the hospital encounter of 03/05/12  TROPONIN I      Result Value Range   Troponin I <0.30  <0.30 ng/mL  CBC WITH DIFFERENTIAL      Result Value Range   WBC 7.4  4.0 - 10.5 K/uL   RBC 3.85 (*) 3.87 - 5.11 MIL/uL   Hemoglobin 11.7 (*) 12.0 - 15.0 g/dL   HCT 16.1 (*) 09.6 - 04.5 %   MCV 88.6  78.0 - 100.0 fL   MCH 30.4  26.0 - 34.0 pg   MCHC 34.3  30.0 - 36.0 g/dL   RDW 40.9  81.1 - 91.4 %   Platelets 294  150 - 400 K/uL   Neutrophils Relative 59   43 - 77 %   Neutro Abs 4.3  1.7 - 7.7 K/uL   Lymphocytes Relative 29  12 - 46 %   Lymphs Abs 2.1  0.7 - 4.0 K/uL   Monocytes Relative 6  3 - 12 %   Monocytes Absolute 0.4  0.1 - 1.0 K/uL   Eosinophils Relative 5  0 - 5 %   Eosinophils Absolute 0.4  0.0 - 0.7 K/uL   Basophils Relative 2 (*) 0 - 1 %   Basophils Absolute 0.1  0.0 - 0.1 K/uL  BASIC METABOLIC PANEL      Result Value Range   Sodium 137  135 - 145 mEq/L   Potassium 3.6  3.5 - 5.1 mEq/L   Chloride 97  96 - 112 mEq/L   CO2 29  19 - 32 mEq/L   Glucose, Bld 94  70 - 99 mg/dL   BUN 14  6 - 23 mg/dL   Creatinine, Ser 7.82  0.50 - 1.10 mg/dL   Calcium 9.3  8.4 - 95.6 mg/dL   GFR calc non Af Amer >90  >90 mL/min   GFR calc Af Amer >90  >90 mL/min  D-DIMER, QUANTITATIVE      Result Value Range   D-Dimer, Quant 0.46  0.00 - 0.48 ug/mL-FEU     1. Chest pain       MDM   Patient's chest pain workup is negative EKG without any acute changes troponin was negative d-dimer is negative chest x-ray negative for pneumonia or pneumothorax. Patient's chest pain that occurred in Dr. Rayna Sexton off this was less than 15 minutes in duration. Based on labs not consistent with an acute cardiac event particularly in the face of the normal labs. Patient will be discharged home will return for any chest pain that recurs since the last 15 or 20 minutes or longer.  I personally performed the services described in this documentation, which was scribed in my presence. The recorded information has been reviewed and is accurate.     Shelda Jakes, MD 03/05/12 [phone]

## 2012-03-05 NOTE — ED Notes (Signed)
Patient with no complaints at this time. Respirations even and unlabored. Skin warm/dry. Discharge instructions reviewed with patient at this time. Patient given opportunity to voice concerns/ask questions.My chart reviewed.   Patient discharged at this time and left Emergency Department with steady gait.

## 2012-03-05 NOTE — ED Notes (Signed)
Patient c/o severe sciatic pain L hip/leg.  Notified Dr. Deretha Emory.  Applied ice pack to back.

## 2012-05-10 ENCOUNTER — Telehealth: Payer: Self-pay | Admitting: Obstetrics and Gynecology

## 2012-05-10 NOTE — Telephone Encounter (Signed)
Spoke with pt. Pt wants to know if Dr. Emelda Fear noticed anything about her bladder mesh implant when he done her surgery in Feb for the ovarian tumor. Please call pt with info.

## 2012-05-11 NOTE — Telephone Encounter (Signed)
Spoke with Dr. Emelda Fear. As far as his recollection, everything was fine with the mesh. Pt notified.

## 2012-07-13 ENCOUNTER — Telehealth: Payer: Self-pay | Admitting: Obstetrics and Gynecology

## 2012-07-13 NOTE — Telephone Encounter (Signed)
C/o hot-flashes since surgery (Laparoscopic salpingo oophorectomy 02/17/2012). Pt estradiol 2 mg daily not working as before the surgery. Pt states is paying on her balance but unable to pay in full. What does she need to do?

## 2012-07-13 NOTE — Telephone Encounter (Signed)
Pt complains of hot flashes and moods crazy, already taking estrace 2 mg daily told her that was as high as could go with that, could make appt to discuss changing meds. Try taking at lunch to see if it helps any

## 2012-08-17 ENCOUNTER — Other Ambulatory Visit (HOSPITAL_COMMUNITY): Payer: Self-pay | Admitting: Physician Assistant

## 2012-08-17 DIAGNOSIS — Z09 Encounter for follow-up examination after completed treatment for conditions other than malignant neoplasm: Secondary | ICD-10-CM

## 2012-08-17 DIAGNOSIS — Z139 Encounter for screening, unspecified: Secondary | ICD-10-CM

## 2012-09-18 ENCOUNTER — Encounter (HOSPITAL_COMMUNITY): Payer: Self-pay | Admitting: *Deleted

## 2012-09-18 ENCOUNTER — Emergency Department (HOSPITAL_COMMUNITY)
Admission: EM | Admit: 2012-09-18 | Discharge: 2012-09-18 | Disposition: A | Discharge status: Home / Self Care | Payer: No Typology Code available for payment source | Attending: Emergency Medicine | Admitting: Emergency Medicine

## 2012-09-18 DIAGNOSIS — R51 Headache: Secondary | ICD-10-CM | POA: Insufficient documentation

## 2012-09-18 DIAGNOSIS — Z87440 Personal history of urinary (tract) infections: Secondary | ICD-10-CM | POA: Insufficient documentation

## 2012-09-18 DIAGNOSIS — Z8742 Personal history of other diseases of the female genital tract: Secondary | ICD-10-CM | POA: Insufficient documentation

## 2012-09-18 DIAGNOSIS — E7800 Pure hypercholesterolemia, unspecified: Secondary | ICD-10-CM | POA: Insufficient documentation

## 2012-09-18 DIAGNOSIS — R519 Headache, unspecified: Secondary | ICD-10-CM

## 2012-09-18 DIAGNOSIS — F32A Depression, unspecified: Secondary | ICD-10-CM

## 2012-09-18 DIAGNOSIS — F411 Generalized anxiety disorder: Secondary | ICD-10-CM | POA: Insufficient documentation

## 2012-09-18 DIAGNOSIS — K219 Gastro-esophageal reflux disease without esophagitis: Secondary | ICD-10-CM | POA: Insufficient documentation

## 2012-09-18 DIAGNOSIS — F329 Major depressive disorder, single episode, unspecified: Secondary | ICD-10-CM

## 2012-09-18 DIAGNOSIS — R42 Dizziness and giddiness: Secondary | ICD-10-CM | POA: Insufficient documentation

## 2012-09-18 DIAGNOSIS — Z79899 Other long term (current) drug therapy: Secondary | ICD-10-CM | POA: Insufficient documentation

## 2012-09-18 DIAGNOSIS — I10 Essential (primary) hypertension: Secondary | ICD-10-CM | POA: Insufficient documentation

## 2012-09-18 DIAGNOSIS — Z87448 Personal history of other diseases of urinary system: Secondary | ICD-10-CM | POA: Insufficient documentation

## 2012-09-18 DIAGNOSIS — F3289 Other specified depressive episodes: Secondary | ICD-10-CM | POA: Insufficient documentation

## 2012-09-18 MED ORDER — DIAZEPAM 5 MG/ML IJ SOLN
5.0000 mg | Freq: Once | INTRAMUSCULAR | Status: AC
  Administered 2012-09-18: 5 mg via INTRAVENOUS
  Filled 2012-09-18: qty 2

## 2012-09-18 MED ORDER — METOCLOPRAMIDE HCL 5 MG/ML IJ SOLN
10.0000 mg | Freq: Once | INTRAMUSCULAR | Status: AC
  Administered 2012-09-18: 10 mg via INTRAVENOUS
  Filled 2012-09-18: qty 2

## 2012-09-18 MED ORDER — DIAZEPAM 5 MG PO TABS: 5.0000 mg | ORAL_TABLET | Freq: Three times a day (TID) | ORAL | Status: DC | PRN

## 2012-09-18 MED ORDER — DIAZEPAM 5 MG/ML IJ SOLN
2.5000 mg | Freq: Once | INTRAMUSCULAR | Status: AC
  Administered 2012-09-18: 2.5 mg via INTRAVENOUS
  Filled 2012-09-18: qty 2

## 2012-09-18 MED ORDER — SODIUM CHLORIDE 0.9 % IV BOLUS (SEPSIS)
1000.0000 mL | Freq: Once | INTRAVENOUS | Status: AC
  Administered 2012-09-18: 1000 mL via INTRAVENOUS

## 2012-09-18 MED ORDER — DIPHENHYDRAMINE HCL 50 MG/ML IJ SOLN
25.0000 mg | Freq: Once | INTRAMUSCULAR | Status: AC
  Administered 2012-09-18: 25 mg via INTRAVENOUS
  Filled 2012-09-18: qty 1

## 2012-09-18 MED ORDER — KETOROLAC TROMETHAMINE 30 MG/ML IJ SOLN
15.0000 mg | Freq: Once | INTRAMUSCULAR | Status: AC
  Administered 2012-09-18: 15 mg via INTRAVENOUS
  Filled 2012-09-18: qty 1

## 2012-09-18 NOTE — ED Notes (Signed)
CBG 88 per ED Tech

## 2012-09-18 NOTE — ED Notes (Signed)
Pt states is here to be seen for dizziness, and " I guess my depression". Pt denies SI/HI at this time. Pt gets a little more depressed when she has a headache and dizziness for days per pt. Pt reports increased urinary frequency at this time. NAD noted at this time. Pt reminded to ask for assistance while ambulating to BR.

## 2012-09-18 NOTE — ED Notes (Signed)
Agree with triage notes, pt also complains of numbness in feet and states she has a strong family HX of diabetes on her mother's side.

## 2012-09-18 NOTE — ED Provider Notes (Signed)
CSN: [id number]     Arrival date & time 09/18/12  1002 History   None    Chief Complaint  Patient presents with  . Dizziness  . Headache   (Consider location/radiation/quality/duration/timing/severity/associated sxs/prior Treatment) HPI  62 year old female with multiple complaints. Patient is complaining of a headache. Gradual onset 4 days ago. Diffuse and achy in character. No appreciable exacerbating relieving factors. Patient is complaining of vertigo. Issue, she states it is worse over the same time period no acute visual changes. Some numbness in her feet otherwise no acute numbness tingling or loss of strength. No blood thinning medications. Denies any trauma. Patient is also complaining of feeling down and depressed. Denies any suicidal or homicidal ideation. Has out patient therapist. I will start  Past Medical History  Diagnosis Date  . Hypertension   . Cystitides, interstitial, chronic   . Fibromyalgia   . Urinary tract infection   . Urinary disorder     bladder is in sling per pt  . High cholesterol   . Back pain   . Anxiety   . Depression   . Shortness of breath   . GERD (gastroesophageal reflux disease)   . Vertigo    Past Surgical History  Procedure Laterality Date  . Abdominal hysterectomy  1997    [place] , [facility]  . Dewaine Conger    . Toe surgery    . Bladder tack    . Laparoscopic salpingo oopherectomy  02/17/2012    Procedure: LAPAROSCOPIC SALPINGO OOPHORECTOMY;  Surgeon: Tilda Burrow, MD;  Location: [facility];  Service: Gynecology;  Laterality: Bilateral;   No family history on file. History  Substance Use Topics  . Smoking status: Never Smoker   . Smokeless tobacco: Not on file  . Alcohol Use: No     Comment: occassionalym, pt denies any use    OB History   Grav Para Term Preterm Abortions TAB SAB Ect Mult Living                 Review of Systems  All systems reviewed and negative, other than as noted in HPI.   Allergies  Ciprofloxacin  Home  Medications   Current Outpatient Rx  Name  Route  Sig  Dispense  Refill  . ALPRAZolam (XANAX) 0.5 MG tablet   Oral   Take 0.5 mg by mouth 4 (four) times daily as needed for anxiety.          Marland Kitchen amLODipine (NORVASC) 5 MG tablet   Oral   Take 5 mg by mouth daily.         . cetirizine (ZYRTEC) 10 MG tablet   Oral   Take 10 mg by mouth daily as needed. Allergies         . cloNIDine (CATAPRES) 0.1 MG tablet   Oral   Take 0.1 mg by mouth 2 (two) times daily.          . DULoxetine (CYMBALTA) 60 MG capsule   Oral   Take 60 mg by mouth daily.         Marland Kitchen estradiol (ESTRACE) 2 MG tablet   Oral   Take 2 mg by mouth daily.         Marland Kitchen gabapentin (NEURONTIN) 400 MG capsule   Oral   Take 400 mg by mouth 3 (three) times daily.          Marland Kitchen ibuprofen (ADVIL,MOTRIN) 200 MG tablet   Oral   Take 800 mg by mouth every 6 (  six) hours as needed. Pain         . lisinopril-hydrochlorothiazide (PRINZIDE,ZESTORETIC) 20-12.5 MG per tablet   Oral   Take 1 tablet by mouth 2 (two) times daily.         . meclizine (ANTIVERT) 25 MG tablet   Oral   Take 25 mg by mouth every 6 (six) hours as needed for dizziness.         . Methenamine-Sodium Salicylate (CYSTEX) 162-162.5 MG TABS   Oral   Take 2 tablets by mouth daily as needed. For urinary pain         . metoprolol (LOPRESSOR) 100 MG tablet   Oral   Take 100 mg by mouth 2 (two) times daily.          . simvastatin (ZOCOR) 20 MG tablet   Oral   Take 20 mg by mouth every evening.          BP 129/60  Pulse 57  Temp(Src) 98.2 F (36.8 C) (Oral)  Resp 18  Ht 5' 5.5" (1.664 m)  Wt 196 lb (88.905 kg)  BMI 32.11 kg/m2  SpO2 98% Physical Exam  Nursing note and vitals reviewed. Constitutional: She is oriented to person, place, and time. She appears well-developed and well-nourished. No distress.  HENT:  Head: Normocephalic and atraumatic.  Right Ear: External ear normal.  Left Ear: External ear normal.  R ear effusion  w/o signs of infection.   Eyes: Conjunctivae are normal. Right eye exhibits no discharge. Left eye exhibits no discharge.  Neck: Neck supple.  No nuchal rigidity  Cardiovascular: Normal rate, regular rhythm and normal heart sounds.  Exam reveals no gallop and no friction rub.   No murmur heard. Pulmonary/Chest: Effort normal and breath sounds normal. No respiratory distress.  Abdominal: Soft. She exhibits no distension. There is no tenderness.  Musculoskeletal: She exhibits no edema and no tenderness.  Neurological: She is alert and oriented to person, place, and time. She exhibits normal muscle tone.  Speech clear. Content is appropriate. Gait is steady.  Skin: Skin is warm and dry. No rash noted.  Psychiatric: She has a normal mood and affect. Her behavior is normal. Thought content normal.    ED Course  Procedures (including critical care time) Labs Review Labs Reviewed  GLUCOSE, CAPILLARY   Imaging Review No results found.  MDM   1. Headache   2. Depression   3. Vertigo    62yF with HA. Suspect primary HA. Consider emergent secondary causes such as bleed, infectious or mass but doubt. There is no history of trauma. Pt has a nonfocal neurological exam. Afebrile and neck supple. No use of blood thinning medication. Consider ocular etiology such as acute angle closure glaucoma but doubt. Pt denies acute change in visual acuity and eye exam unremarkable. Doubt temporal arteritis. No temporal tenderness and temporal artery pulsations palpable. Doubt CO poisoning. No contacts with similar symptoms. Doubt venous thrombosis. Doubt carotid or vertebral arteries dissection. I suspect that her vertigo is peripheral in etiology. Patient is feeling depressed I suspect that this is contributing significant for symptoms as well. She denies suicidal or homicidal ideation. Symptoms improved with meds. Feel that can be safely discharged, but strict return precautions discussed. Outpt  fu.     Raeford Razor, MD 09/22/12 1719

## 2012-09-18 NOTE — ED Notes (Signed)
Pt c/o dizziness is worse with certain movement, was diagnosed with vertigo 3 weeks ago, started having headaches 4 days ago, pt also c/o nausea, "hot spots" to head area that come and go. Denies any weakness, admits to having hard time with vision that started 4 months ago but denies any new changes since the headaches started 4 days ago,

## 2012-10-05 ENCOUNTER — Other Ambulatory Visit (HOSPITAL_COMMUNITY): Payer: Self-pay | Admitting: Nurse Practitioner

## 2012-10-05 DIAGNOSIS — Z139 Encounter for screening, unspecified: Secondary | ICD-10-CM

## 2012-10-08 ENCOUNTER — Ambulatory Visit (HOSPITAL_COMMUNITY)
Admission: RE | Admit: 2012-10-08 | Discharge: 2012-10-08 | Disposition: A | Discharge status: Home / Self Care | Payer: PRIVATE HEALTH INSURANCE | Source: Ambulatory Visit | Attending: Nurse Practitioner | Admitting: Nurse Practitioner

## 2012-10-08 DIAGNOSIS — Z139 Encounter for screening, unspecified: Secondary | ICD-10-CM

## 2012-10-08 DIAGNOSIS — Z1231 Encounter for screening mammogram for malignant neoplasm of breast: Secondary | ICD-10-CM | POA: Insufficient documentation

## 2012-10-15 DIAGNOSIS — R413 Other amnesia: Secondary | ICD-10-CM | POA: Insufficient documentation

## 2013-02-08 DIAGNOSIS — K625 Hemorrhage of anus and rectum: Secondary | ICD-10-CM | POA: Insufficient documentation

## 2013-02-08 DIAGNOSIS — R203 Hyperesthesia: Secondary | ICD-10-CM | POA: Insufficient documentation

## 2013-03-14 DIAGNOSIS — J309 Allergic rhinitis, unspecified: Secondary | ICD-10-CM | POA: Insufficient documentation

## 2013-03-14 DIAGNOSIS — E782 Mixed hyperlipidemia: Secondary | ICD-10-CM | POA: Insufficient documentation

## 2013-05-23 ENCOUNTER — Ambulatory Visit (INDEPENDENT_AMBULATORY_CARE_PROVIDER_SITE_OTHER): Payer: BC Managed Care – PPO | Admitting: Diagnostic Neuroimaging

## 2013-05-23 ENCOUNTER — Encounter: Payer: Self-pay | Admitting: Diagnostic Neuroimaging

## 2013-05-23 VITALS — BP 141/74 | HR 56 | Ht 65.5 in | Wt 194.0 lb

## 2013-05-23 DIAGNOSIS — R200 Anesthesia of skin: Secondary | ICD-10-CM

## 2013-05-23 DIAGNOSIS — R269 Unspecified abnormalities of gait and mobility: Secondary | ICD-10-CM

## 2013-05-23 DIAGNOSIS — R209 Unspecified disturbances of skin sensation: Secondary | ICD-10-CM

## 2013-05-23 DIAGNOSIS — R42 Dizziness and giddiness: Secondary | ICD-10-CM
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
## 2013-05-24 LAB — VITAMIN B12: VITAMIN B 12: 414 pg/mL (ref 211–946)

## 2013-05-24 LAB — HEMOGLOBIN A1C
ESTIMATED AVERAGE GLUCOSE: 123 mg/dL
Hgb A1c MFr Bld: 5.9 % — ABNORMAL HIGH (ref 4.8–5.6)

## 2013-05-24 LAB — TSH: TSH: 2.08 u[IU]/mL (ref 0.450–4.500)

## 2013-05-26 ENCOUNTER — Telehealth: Payer: Self-pay | Admitting: Diagnostic Neuroimaging

## 2013-05-26 NOTE — Telephone Encounter (Signed)
Cassandra with Dr Estanislado Pandy has questions regarding patients referral faxed over.  Please return call.

## 2013-05-26 NOTE — Telephone Encounter (Signed)
Called [name] at [facility] to inform her per Dr. Leta Baptist that the pt's information was sent in error, that the doctor did not refer this pt. [name] verbalized understanding.

## 2013-06-16 ENCOUNTER — Other Ambulatory Visit: Payer: Self-pay | Admitting: Obstetrics and Gynecology

## 2013-06-17 ENCOUNTER — Other Ambulatory Visit: Payer: Self-pay | Admitting: Obstetrics and Gynecology

## 2013-06-19 NOTE — Telephone Encounter (Signed)
refil x 90 days, will need sept recall

## 2013-06-21 DIAGNOSIS — R300 Dysuria: Secondary | ICD-10-CM | POA: Insufficient documentation

## 2013-06-21 DIAGNOSIS — E782 Mixed hyperlipidemia: Secondary | ICD-10-CM | POA: Diagnosis not present

## 2013-06-21 DIAGNOSIS — H8110 Benign paroxysmal vertigo, unspecified ear: Secondary | ICD-10-CM | POA: Insufficient documentation

## 2013-06-21 DIAGNOSIS — I10 Essential (primary) hypertension: Secondary | ICD-10-CM | POA: Diagnosis not present

## 2013-06-21 DIAGNOSIS — G4730 Sleep apnea, unspecified: Secondary | ICD-10-CM | POA: Diagnosis not present

## 2013-06-24 ENCOUNTER — Other Ambulatory Visit: Payer: Self-pay | Admitting: Obstetrics and Gynecology

## 2013-06-25 NOTE — Telephone Encounter (Signed)
refil x 90 days only: a recall visit indicated

## 2013-07-06 DIAGNOSIS — W57XXXA Bitten or stung by nonvenomous insect and other nonvenomous arthropods, initial encounter: Secondary | ICD-10-CM | POA: Insufficient documentation

## 2013-07-14 ENCOUNTER — Ambulatory Visit
Admission: RE | Admit: 2013-07-14 | Discharge: 2013-07-14 | Disposition: A | Discharge status: Home / Self Care | Payer: Medicare Other | Source: Ambulatory Visit | Attending: Diagnostic Neuroimaging | Admitting: Diagnostic Neuroimaging

## 2013-07-14 DIAGNOSIS — R269 Unspecified abnormalities of gait and mobility: Secondary | ICD-10-CM | POA: Diagnosis not present

## 2013-07-14 DIAGNOSIS — R42 Dizziness and giddiness: Secondary | ICD-10-CM

## 2013-07-14 DIAGNOSIS — R209 Unspecified disturbances of skin sensation: Secondary | ICD-10-CM | POA: Diagnosis not present

## 2013-07-14 DIAGNOSIS — R200 Anesthesia of skin: Secondary | ICD-10-CM

## 2013-07-14 MED ORDER — GADOBENATE DIMEGLUMINE 529 MG/ML IV SOLN
18.0000 mL | Freq: Once | INTRAVENOUS | Status: AC | PRN
  Administered 2013-07-14: 18 mL via INTRAVENOUS

## 2013-07-18 ENCOUNTER — Telehealth: Payer: Self-pay | Admitting: Diagnostic Neuroimaging

## 2013-07-18 NOTE — Telephone Encounter (Signed)
Patient calling to get MRI results. °

## 2013-07-18 NOTE — Telephone Encounter (Signed)
I called patient with results. MRI and labs are unremarkable. Continue current plan (stress, mood, pain mgmt).  -VRP

## 2013-07-18 NOTE — Telephone Encounter (Signed)
Called [name] to inform her that her MRI results were not in and as soon as Dr. Leta Baptist get those, that someone will be giving her a call concerning the results. I advised the [name] that if she has any other problems, questions or concerns to call the office. [name] verbalized understanding. FYI

## 2013-07-21 ENCOUNTER — Ambulatory Visit: Payer: BC Managed Care – PPO | Admitting: Obstetrics and Gynecology

## 2013-07-25 ENCOUNTER — Ambulatory Visit (INDEPENDENT_AMBULATORY_CARE_PROVIDER_SITE_OTHER): Payer: Medicare Other | Admitting: Obstetrics and Gynecology

## 2013-07-25 ENCOUNTER — Encounter: Payer: Self-pay | Admitting: Obstetrics and Gynecology

## 2013-07-25 VITALS — BP 118/70 | Ht 65.5 in | Wt 193.0 lb

## 2013-07-25 DIAGNOSIS — Z538 Procedure and treatment not carried out for other reasons: Secondary | ICD-10-CM

## 2013-07-25 DIAGNOSIS — Z0000 Encounter for general adult medical examination without abnormal findings: Secondary | ICD-10-CM

## 2013-07-25 DIAGNOSIS — Z01419 Encounter for gynecological examination (general) (routine) without abnormal findings: Secondary | ICD-10-CM | POA: Diagnosis not present

## 2013-07-25 MED ORDER — ESTRADIOL 2 MG PO TABS: ORAL_TABLET | ORAL | Status: DC

## 2013-07-25 NOTE — Progress Notes (Signed)
This chart was   [facility] Visit  Patient name: [name] MRN [id number]  Date of birth: October 07, 1950  CC & HPI:  [name] is a 63 y.o. female presenting today for a physical exam.  She reports she does annual mammograms.  She reports she takes 2 mg estradiol with improvement to moods and hot flashes. She also reports she had a MRI which indicated small lesions.  She states she is walking daily and increasing diligence re diet.  She reports a h/o lump to her left breast.  ROS:   +Dizziness +Dyspareunia  No other complaints   Pertinent History Reviewed:   Reviewed: Significant for  Medical                                    Surgical Hx:  Vaginal hysterectomy; bladder sling  Medications: Reviewed & Updated - see associated section Social History: Reviewed -  reports that she has never smoked. She has never used smokeless tobacco.  Objective Findings:  Vitals: Blood pressure 118/70, height 5' 5.5" (1.664 m), weight 87.544 kg (193 lb).  Physical Examination: General appearance - alert, well appearing, and in no distress  Mental status - alert, oriented to person, place, and time  Abdomen - soft, nontender, nondistended, no masses or organomegaly  Breasts - breasts appear normal, no suspicious masses, no skin or nipple changes or axillary nodes; breast tissue even  Rectal - normal rectal, no masses  Pelvic - normal external genitalia, vulva, vagina,   VULVA: normal appearing vulva with no masses, tenderness or lesions, VAGINA: normal appearing vagina with normal color and discharge, no lesions, RECTAL: normal rectal, no masses  Assessment & Plan:    A: 1. Annual gyn Physical exam 2. Dyspareunia due to tape from Trans-obturator tape wing on left 3. Hx Interstitial Cystitis P: 1. Refill estradiol

## 2013-07-28 NOTE — Progress Notes (Signed)
No show

## 2013-08-10 DIAGNOSIS — H9209 Otalgia, unspecified ear: Secondary | ICD-10-CM | POA: Diagnosis not present

## 2013-08-10 DIAGNOSIS — H903 Sensorineural hearing loss, bilateral: Secondary | ICD-10-CM | POA: Diagnosis not present

## 2013-08-10 DIAGNOSIS — G4733 Obstructive sleep apnea (adult) (pediatric): Secondary | ICD-10-CM | POA: Diagnosis not present

## 2013-08-10 DIAGNOSIS — G4710 Hypersomnia, unspecified: Secondary | ICD-10-CM | POA: Diagnosis not present

## 2013-08-18 NOTE — Telephone Encounter (Signed)
Noted  

## 2013-08-30 DIAGNOSIS — F332 Major depressive disorder, recurrent severe without psychotic features: Secondary | ICD-10-CM | POA: Diagnosis not present

## 2013-09-06 ENCOUNTER — Ambulatory Visit (INDEPENDENT_AMBULATORY_CARE_PROVIDER_SITE_OTHER): Payer: Medicare Other | Admitting: Psychology

## 2013-09-06 DIAGNOSIS — F411 Generalized anxiety disorder: Secondary | ICD-10-CM | POA: Diagnosis not present

## 2013-09-06 DIAGNOSIS — F331 Major depressive disorder, recurrent, moderate: Secondary | ICD-10-CM

## 2013-09-15 DIAGNOSIS — IMO0002 Reserved for concepts with insufficient information to code with codable children: Secondary | ICD-10-CM | POA: Diagnosis not present

## 2013-09-15 DIAGNOSIS — N3946 Mixed incontinence: Secondary | ICD-10-CM | POA: Diagnosis not present

## 2013-09-26 ENCOUNTER — Ambulatory Visit (INDEPENDENT_AMBULATORY_CARE_PROVIDER_SITE_OTHER): Payer: Medicare Other | Admitting: Psychology

## 2013-09-26 DIAGNOSIS — F411 Generalized anxiety disorder: Secondary | ICD-10-CM | POA: Diagnosis not present

## 2013-09-26 DIAGNOSIS — F331 Major depressive disorder, recurrent, moderate: Secondary | ICD-10-CM

## 2013-10-03 DIAGNOSIS — R279 Unspecified lack of coordination: Secondary | ICD-10-CM | POA: Diagnosis not present

## 2013-10-03 DIAGNOSIS — IMO0002 Reserved for concepts with insufficient information to code with codable children: Secondary | ICD-10-CM | POA: Diagnosis not present

## 2013-10-03 DIAGNOSIS — N3946 Mixed incontinence: Secondary | ICD-10-CM | POA: Diagnosis not present

## 2013-10-03 DIAGNOSIS — M6281 Muscle weakness (generalized): Secondary | ICD-10-CM | POA: Diagnosis not present

## 2013-10-11 DIAGNOSIS — M6281 Muscle weakness (generalized): Secondary | ICD-10-CM | POA: Diagnosis not present

## 2013-10-11 DIAGNOSIS — R279 Unspecified lack of coordination: Secondary | ICD-10-CM | POA: Diagnosis not present

## 2013-10-11 DIAGNOSIS — IMO0002 Reserved for concepts with insufficient information to code with codable children: Secondary | ICD-10-CM | POA: Diagnosis not present

## 2013-10-11 DIAGNOSIS — N3946 Mixed incontinence: Secondary | ICD-10-CM | POA: Diagnosis not present

## 2013-10-19 ENCOUNTER — Ambulatory Visit (INDEPENDENT_AMBULATORY_CARE_PROVIDER_SITE_OTHER): Payer: Medicare Other | Admitting: Psychology

## 2013-10-19 DIAGNOSIS — F411 Generalized anxiety disorder: Secondary | ICD-10-CM | POA: Diagnosis not present

## 2013-10-19 DIAGNOSIS — F331 Major depressive disorder, recurrent, moderate: Secondary | ICD-10-CM

## 2013-10-20 ENCOUNTER — Encounter (HOSPITAL_COMMUNITY): Payer: Self-pay | Admitting: Psychology

## 2013-10-20 NOTE — Progress Notes (Signed)
PROGRESS NOTE  Patient:  [name]   DOB: 1950/06/01  MR Number: [id number]  Location: [facility] [address] [place] [address] Dept: [phone]  Start: 3 PM  End: 4 PM  Provider/Observer:     Edgardo Roys PSYD  Chief Complaint:      Chief Complaint  Patient presents with  . Depression  . Anxiety    Reason For Service:    The patient was referred by Dr. Redmond Pulling because of significant acute difficulties. The patient reports that she is constantly feeling dizzy and has a swimming/vertigo sensation in throughout her head. The patient reports that she is having trouble with this and major stresses causing her great symptoms associated with depression and anxiety. The patient reports she cannot function because of this dizziness/earache and feels like she is emotional all the time. She reports that she "never feels good." The patient reports that she is on lots of medications for high blood pressure and no energy. Patient reports that beyond these medical issues that are causing a great deal of stress that she is also experiencing a lot of stress taking care of her mother. There is significant financial stress associated with her 38 year old son. She reports that she has been married for 23 years and that her husband has a lot of issues that cause her stress. She reports that while she loves him there are times when she can't stand him. The patient reports that her symptoms include both anxiety and depression. The patient reports that she lost a brother at 29 years of age due to drugs and alcohol abuse and another brother is in a nursing home at the age of 65 years old. He is a paraplegic and had a stroke after an overdose. The patient reports that she has a sleep apnea/CPAP device and has to take numerous medications including gabapentin and Xanax. However, she reports that she is not doing  very well.      Interventions Strategy:  Cognitive/behavioral psychotherapeutic interventions  Participation Level:   Active  Participation Quality:  Appropriate      Behavioral Observation:  Well Groomed, Alert, and Appropriate.   Current Psychosocial Factors: The patient reports that she is still feeling very frustrated and stressed with her husband especially with her husband being at home all the time. He has been injured at work in early as this caused financial stress for the patient and also his left and spending a great view more time together which exacerbates her stress for him.  Content of Session:   We reviewed current symptoms and continue to work on therapeutic interventions for anxiety and depression.  Current Status:   Current status includes significant symptoms of depression and anxiety that are improving.  Patient Progress:   Stable  Target Goals:   Target goals include reducing intensity, severity, and duration of anxiety depression including feelings of helplessness and hopelessness, and overwhelmed by current stressors, fatigue, cognitive difficulties, irritability, agitation cognitive difficulties, and insomnia.  Last Reviewed:   10/19/2013  Goals Addressed Today:    Goals addressed they had to do with building better coping skills specifically around issues of depression anxiety utilizing cognitive/behavioral therapeutic interventions as well as basic coping mechanisms around good diet, exercise, and sleep hygiene.  Impression/Diagnosis:  the patient describes a history of previous depression but development of vertigo-like symptoms along with difficulties with her husband to some degree but to a greater degree  of stress with her youngest son and the stress of taking care of the mother anxiety and depression and become paramount. The patient reports that she has had a great deal of difficulties coping with the stressors which have exacerbated her depression and  anxiety.   Diagnosis:    Axis I: No diagnosis found.    Edgardo Roys, PsyD 10/20/2013

## 2013-10-20 NOTE — Progress Notes (Signed)
PROGRESS NOTE  Patient:  [name]   DOB: [birth year]  MR Number: [id number]  Location: [facility] [address] [place] [address] Dept: [phone]  Start: 3 PM  End: 4 PM  Provider/Observer:     Edgardo Roys PSYD  Chief Complaint:      Chief Complaint  Patient presents with  . Anxiety  . Depression    Reason For Service:    The patient was referred by Dr. Redmond Pulling because of significant acute difficulties. The patient reports that she is constantly feeling dizzy and has a swimming/vertigo sensation in throughout her head. The patient reports that she is having trouble with this and major stresses causing her great symptoms associated with depression and anxiety. The patient reports she cannot function because of this dizziness/earache and feels like she is emotional all the time. She reports that she "never feels good." The patient reports that she is on lots of medications for high blood pressure and no energy. Patient reports that beyond these medical issues that are causing a great deal of stress that she is also experiencing a lot of stress taking care of her mother. There is significant financial stress associated with her 52 year old son. She reports that she has been married for 23 years and that her husband has a lot of issues that cause her stress. She reports that while she loves him there are times when she can't stand him. The patient reports that her symptoms include both anxiety and depression. The patient reports that she lost a brother at 74 years of age due to drugs and alcohol abuse and another brother is in a nursing home at the age of 65 years old. He is a paraplegic and had a stroke after an overdose. The patient reports that she has a sleep apnea/CPAP device and has to take numerous medications including gabapentin and Xanax. However, she reports that she is not doing  very well.      Interventions Strategy:  Cognitive/behavioral psychotherapeutic interventions  Participation Level:   Active  Participation Quality:  Appropriate      Behavioral Observation:  Well Groomed, Alert, and Appropriate.   Current Psychosocial Factors: The patient reports that she has been stressed to review with her mother recently but also having to cope with situations getting her husband to be more helpful and active. The patient reports that these have continued to be problematic but does report that her depression and anxiety have improved to some degree.  Content of Session:   We reviewed current symptoms and continue to work on therapeutic interventions for anxiety and depression.  Current Status:   Current status includes significant symptoms of depression and anxiety that are improving.  Patient Progress:   Stable  Target Goals:   Target goals include reducing intensity, severity, and duration of anxiety depression including feelings of helplessness and hopelessness, and overwhelmed by current stressors, fatigue, cognitive difficulties, irritability, agitation cognitive difficulties, and insomnia.  Last Reviewed:   09/26/2013  Goals Addressed Today:    Goals addressed they had to do with building better coping skills specifically around issues of depression anxiety utilizing cognitive/behavioral therapeutic interventions as well as basic coping mechanisms around good diet, exercise, and sleep hygiene.  Impression/Diagnosis:  the patient describes a history of previous depression but development of vertigo-like symptoms along with difficulties with her husband to some degree but to a greater degree of stress with her youngest son  and the stress of taking care of the mother anxiety and depression and become paramount. The patient reports that she has had a great deal of difficulties coping with the stressors which have exacerbated her depression and  anxiety.   Diagnosis:    Axis I: Major depressive disorder, recurrent episode, moderate  Generalized anxiety disorder    RODENBOUGH,JOHN R, PsyD 10/20/2013
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
## 2013-10-25 DIAGNOSIS — N941 Dyspareunia: Secondary | ICD-10-CM | POA: Diagnosis not present

## 2013-10-25 DIAGNOSIS — M6281 Muscle weakness (generalized): Secondary | ICD-10-CM | POA: Diagnosis not present

## 2013-10-25 DIAGNOSIS — R278 Other lack of coordination: Secondary | ICD-10-CM | POA: Diagnosis not present

## 2013-10-25 DIAGNOSIS — N3946 Mixed incontinence: Secondary | ICD-10-CM | POA: Diagnosis not present

## 2013-11-02 DIAGNOSIS — N941 Dyspareunia: Secondary | ICD-10-CM | POA: Diagnosis not present

## 2013-11-02 DIAGNOSIS — N3946 Mixed incontinence: Secondary | ICD-10-CM | POA: Diagnosis not present

## 2013-11-02 DIAGNOSIS — M6281 Muscle weakness (generalized): Secondary | ICD-10-CM | POA: Diagnosis not present

## 2013-11-02 DIAGNOSIS — R278 Other lack of coordination: Secondary | ICD-10-CM | POA: Diagnosis not present

## 2013-11-08 ENCOUNTER — Ambulatory Visit (HOSPITAL_COMMUNITY): Payer: Self-pay | Admitting: Psychology

## 2013-11-10 ENCOUNTER — Ambulatory Visit (HOSPITAL_COMMUNITY): Payer: Self-pay | Admitting: Psychology

## 2013-11-10 DIAGNOSIS — R278 Other lack of coordination: Secondary | ICD-10-CM | POA: Diagnosis not present

## 2013-11-10 DIAGNOSIS — N3010 Interstitial cystitis (chronic) without hematuria: Secondary | ICD-10-CM | POA: Diagnosis not present

## 2013-11-10 DIAGNOSIS — N3946 Mixed incontinence: Secondary | ICD-10-CM | POA: Diagnosis not present

## 2013-11-10 DIAGNOSIS — M6281 Muscle weakness (generalized): Secondary | ICD-10-CM | POA: Diagnosis not present

## 2013-11-10 DIAGNOSIS — N941 Dyspareunia: Secondary | ICD-10-CM | POA: Diagnosis not present

## 2013-11-14 ENCOUNTER — Ambulatory Visit (INDEPENDENT_AMBULATORY_CARE_PROVIDER_SITE_OTHER): Payer: Medicare Other | Admitting: Psychology

## 2013-11-14 DIAGNOSIS — F331 Major depressive disorder, recurrent, moderate: Secondary | ICD-10-CM | POA: Diagnosis not present

## 2013-11-14 DIAGNOSIS — F411 Generalized anxiety disorder: Secondary | ICD-10-CM

## 2013-11-17 ENCOUNTER — Telehealth: Payer: Self-pay | Admitting: Obstetrics and Gynecology

## 2013-11-17 NOTE — Telephone Encounter (Signed)
Pt states that she needs an increase in her estradiol. Pt states that the last time she was in the office she was not very happy about her visit and that she was very uncomfortable because of the residents.  I advised the pt that she may need to make an appointment and even offered to switch the call up front to make an appointment but pt wanted me to ask Dr. Glo Herring if he would send an increased dose to the pharmacy first. I advised the pt that she would probably not hear anything from Dr. Glo Herring until the first of next week because he is out of the office until then. Pt verbalized understanding.

## 2013-11-21 ENCOUNTER — Other Ambulatory Visit: Payer: Self-pay | Admitting: Obstetrics and Gynecology

## 2013-11-21 DIAGNOSIS — Z1231 Encounter for screening mammogram for malignant neoplasm of breast: Secondary | ICD-10-CM

## 2013-11-21 NOTE — Telephone Encounter (Signed)
Pt informed that she's already on maximum estradiol, clonidine, and a SSNRI, so no more easy vasomotor sx treatments are easily available. Pt has already failed OTC tx with black cohosh. Pt to followup prn office.

## 2013-11-22 DIAGNOSIS — N3946 Mixed incontinence: Secondary | ICD-10-CM | POA: Diagnosis not present

## 2013-11-22 DIAGNOSIS — R278 Other lack of coordination: Secondary | ICD-10-CM | POA: Diagnosis not present

## 2013-11-22 DIAGNOSIS — M6281 Muscle weakness (generalized): Secondary | ICD-10-CM | POA: Diagnosis not present

## 2013-11-22 DIAGNOSIS — N3010 Interstitial cystitis (chronic) without hematuria: Secondary | ICD-10-CM | POA: Diagnosis not present

## 2013-11-23 ENCOUNTER — Ambulatory Visit (HOSPITAL_COMMUNITY): Payer: Self-pay | Admitting: Psychology

## 2013-11-23 ENCOUNTER — Ambulatory Visit (HOSPITAL_COMMUNITY)
Admission: RE | Admit: 2013-11-23 | Discharge: 2013-11-23 | Disposition: A | Discharge status: Home / Self Care | Payer: Medicare Other | Source: Ambulatory Visit | Attending: Obstetrics and Gynecology | Admitting: Obstetrics and Gynecology

## 2013-11-23 ENCOUNTER — Ambulatory Visit: Payer: Medicare Other | Admitting: Diagnostic Neuroimaging

## 2013-11-23 DIAGNOSIS — Z1231 Encounter for screening mammogram for malignant neoplasm of breast: Secondary | ICD-10-CM | POA: Insufficient documentation

## 2013-11-30 DIAGNOSIS — R278 Other lack of coordination: Secondary | ICD-10-CM | POA: Diagnosis not present

## 2013-11-30 DIAGNOSIS — N3946 Mixed incontinence: Secondary | ICD-10-CM | POA: Diagnosis not present

## 2013-11-30 DIAGNOSIS — M6281 Muscle weakness (generalized): Secondary | ICD-10-CM | POA: Diagnosis not present

## 2013-11-30 DIAGNOSIS — N3010 Interstitial cystitis (chronic) without hematuria: Secondary | ICD-10-CM | POA: Diagnosis not present

## 2013-12-06 ENCOUNTER — Ambulatory Visit (INDEPENDENT_AMBULATORY_CARE_PROVIDER_SITE_OTHER): Payer: Medicare Other | Admitting: Psychology

## 2013-12-06 DIAGNOSIS — F411 Generalized anxiety disorder: Secondary | ICD-10-CM

## 2013-12-06 DIAGNOSIS — F331 Major depressive disorder, recurrent, moderate: Secondary | ICD-10-CM

## 2013-12-07 DIAGNOSIS — M5432 Sciatica, left side: Secondary | ICD-10-CM | POA: Diagnosis not present

## 2013-12-07 DIAGNOSIS — M1712 Unilateral primary osteoarthritis, left knee: Secondary | ICD-10-CM | POA: Diagnosis not present

## 2013-12-07 DIAGNOSIS — M222X2 Patellofemoral disorders, left knee: Secondary | ICD-10-CM | POA: Diagnosis not present

## 2013-12-07 DIAGNOSIS — M25562 Pain in left knee: Secondary | ICD-10-CM | POA: Diagnosis not present

## 2013-12-20 DIAGNOSIS — R278 Other lack of coordination: Secondary | ICD-10-CM | POA: Diagnosis not present

## 2013-12-20 DIAGNOSIS — M6281 Muscle weakness (generalized): Secondary | ICD-10-CM | POA: Diagnosis not present

## 2013-12-21 ENCOUNTER — Encounter (HOSPITAL_COMMUNITY): Payer: Self-pay | Admitting: Psychology

## 2013-12-21 NOTE — Progress Notes (Signed)
PROGRESS NOTE  Patient:  [name]   DOB: July 16, 1950  MR Number: [id number]  Location: [facility] [address] [place] [address] Dept: [phone]  Start: 2 PM  End: 3 PM  Provider/Observer:     Edgardo Roys PSYD  Chief Complaint:      Chief Complaint  Patient presents with  . Anxiety  . Depression    Reason For Service:    The patient was referred by Dr. Redmond Pulling because of significant acute difficulties. The patient reports that she is constantly feeling dizzy and has a swimming/vertigo sensation in throughout her head. The patient reports that she is having trouble with this and major stresses causing her great symptoms associated with depression and anxiety. The patient reports she cannot function because of this dizziness/earache and feels like she is emotional all the time. She reports that she "never feels good." The patient reports that she is on lots of medications for high blood pressure and no energy. Patient reports that beyond these medical issues that are causing a great deal of stress that she is also experiencing a lot of stress taking care of her mother. There is significant financial stress associated with her 68 year old son. She reports that she has been married for 23 years and that her husband has a lot of issues that cause her stress. She reports that while she loves him there are times when she can't stand him. The patient reports that her symptoms include both anxiety and depression. The patient reports that she lost a brother at 66 years of age due to drugs and alcohol abuse and another brother is in a nursing home at the age of 65 years old. He is a paraplegic and had a stroke after an overdose. The patient reports that she has a sleep apnea/CPAP device and has to take numerous medications including gabapentin and Xanax. However, she reports that she is not  doing very well.      Interventions Strategy:  Cognitive/behavioral psychotherapeutic interventions  Participation Level:   Active  Participation Quality:  Appropriate      Behavioral Observation:  Well Groomed, Alert, and Appropriate.   Current Psychosocial Factors: The patient reports that she is still feeling very frustrated and stressed with her husband especially with her husband being at home all the time. She is trying to get him to do more around the house but continues to not. The patient reports that she is handling it better at this point and is focused on taking care of herself in getting the things she needs to do..  Content of Session:   We reviewed current symptoms and continue to work on therapeutic interventions for anxiety and depression.  Current Status:   Current status includes significant symptoms of depression and anxiety that are improving.  The patient reports that she continues to see improvement in her depression anxiety.  Patient Progress:   Stable  Target Goals:   Target goals include reducing intensity, severity, and duration of anxiety depression including feelings of helplessness and hopelessness, and overwhelmed by current stressors, fatigue, cognitive difficulties, irritability, agitation cognitive difficulties, and insomnia.  Last Reviewed:   11/14/2013  Goals Addressed Today:    Goals addressed they had to do with building better coping skills specifically around issues of depression anxiety utilizing cognitive/behavioral therapeutic interventions as well as basic coping mechanisms around good diet, exercise, and sleep hygiene.  Impression/Diagnosis:  the  patient describes a history of previous depression but development of vertigo-like symptoms along with difficulties with her husband to some degree but to a greater degree of stress with her youngest son and the stress of taking care of the mother anxiety and depression and become paramount. The patient  reports that she has had a great deal of difficulties coping with the stressors which have exacerbated her depression and anxiety.   Diagnosis:    Axis I: Major depressive disorder, recurrent episode, moderate  Generalized anxiety disorder    Kongmeng Santoro R, PsyD 12/21/2013

## 2013-12-22 ENCOUNTER — Encounter (HOSPITAL_COMMUNITY): Payer: Self-pay | Admitting: Psychology

## 2013-12-22 NOTE — Progress Notes (Signed)
PROGRESS NOTE  Patient:  [name]   DOB: 03/06/1950  MR Number: [id number]  Location: [facility] [address] [place] [address] Dept: [phone]  Start: 2 PM  End: 3 PM  Provider/Observer:     Edgardo Roys PSYD  Chief Complaint:      Chief Complaint  Patient presents with  . Anxiety  . Depression    Reason For Service:    The patient was referred by Dr. Redmond Pulling because of significant acute difficulties. The patient reports that she is constantly feeling dizzy and has a swimming/vertigo sensation in throughout her head. The patient reports that she is having trouble with this and major stresses causing her great symptoms associated with depression and anxiety. The patient reports she cannot function because of this dizziness/earache and feels like she is emotional all the time. She reports that she "never feels good." The patient reports that she is on lots of medications for high blood pressure and no energy. Patient reports that beyond these medical issues that are causing a great deal of stress that she is also experiencing a lot of stress taking care of her mother. There is significant financial stress associated with her 43 year old son. She reports that she has been married for 23 years and that her husband has a lot of issues that cause her stress. She reports that while she loves him there are times when she can't stand him. The patient reports that her symptoms include both anxiety and depression. The patient reports that she lost a brother at 44 years of age due to drugs and alcohol abuse and another brother is in a nursing home at the age of 65 years old. He is a paraplegic and had a stroke after an overdose. The patient reports that she has a sleep apnea/CPAP device and has to take numerous medications including gabapentin and Xanax. However, she reports that she is not  doing very well.      Interventions Strategy:  Cognitive/behavioral psychotherapeutic interventions  Participation Level:   Active  Participation Quality:  Appropriate      Behavioral Observation:  Well Groomed, Alert, and Appropriate.   Current Psychosocial Factors: The patient reports that she continues to have some significant frustration with her husband although she has been working on this. The patient reports that her dizziness has improved somewhat but continues to be very problematic for her and limiting her activities away from home however, she reports that her anxiety and depression have been improving.  Content of Session:   We reviewed current symptoms and continue to work on therapeutic interventions for anxiety and depression.  Current Status:   Current status includes significant symptoms of depression and anxiety that are improving.  The patient reports that she continues to see improvement in her depression anxiety.  Patient Progress:   Stable  Target Goals:   Target goals include reducing intensity, severity, and duration of anxiety depression including feelings of helplessness and hopelessness, and overwhelmed by current stressors, fatigue, cognitive difficulties, irritability, agitation cognitive difficulties, and insomnia.  Last Reviewed:   12/06/2013  Goals Addressed Today:    Goals addressed they had to do with building better coping skills specifically around issues of depression anxiety utilizing cognitive/behavioral therapeutic interventions as well as basic coping mechanisms around good diet, exercise, and sleep hygiene.  Impression/Diagnosis:  the patient describes a history of previous depression but development of vertigo-like symptoms  along with difficulties with her husband to some degree but to a greater degree of stress with her youngest son and the stress of taking care of the mother anxiety and depression and become paramount. The patient reports that  she has had a great deal of difficulties coping with the stressors which have exacerbated her depression and anxiety.   Diagnosis:    Axis I: Major depressive disorder, recurrent episode, moderate  Generalized anxiety disorder    RODENBOUGH,JOHN R, PsyD 12/22/2013

## 2013-12-30 ENCOUNTER — Ambulatory Visit (INDEPENDENT_AMBULATORY_CARE_PROVIDER_SITE_OTHER): Payer: Medicare Other | Admitting: Psychology

## 2013-12-30 ENCOUNTER — Encounter (HOSPITAL_COMMUNITY): Payer: Self-pay | Admitting: Psychology

## 2013-12-30 DIAGNOSIS — F411 Generalized anxiety disorder: Secondary | ICD-10-CM

## 2013-12-30 DIAGNOSIS — F331 Major depressive disorder, recurrent, moderate: Secondary | ICD-10-CM

## 2013-12-30 NOTE — Progress Notes (Signed)
PROGRESS NOTE  Patient:  [name]   DOB: [birth year]  MR Number: [id number]  Location: [facility] [address] [place] [address] Dept: [phone]  Start: 11 AM  End: 12 PM  Provider/Observer:     Edgardo Roys PSYD  Chief Complaint:      Chief Complaint  Patient presents with  . Depression  . Anxiety    Reason For Service:    The patient was referred by Dr. Redmond Pulling because of significant acute difficulties. The patient reports that she is constantly feeling dizzy and has a swimming/vertigo sensation in throughout her head. The patient reports that she is having trouble with this and major stresses causing her great symptoms associated with depression and anxiety. The patient reports she cannot function because of this dizziness/earache and feels like she is emotional all the time. She reports that she "never feels good." The patient reports that she is on lots of medications for high blood pressure and no energy. Patient reports that beyond these medical issues that are causing a great deal of stress that she is also experiencing a lot of stress taking care of her mother. There is significant financial stress associated with her 65 year old son. She reports that she has been married for 23 years and that her husband has a lot of issues that cause her stress. She reports that while she loves him there are times when she can't stand him. The patient reports that her symptoms include both anxiety and depression. The patient reports that she lost a brother at 36 years of age due to drugs and alcohol abuse and another brother is in a nursing home at the age of 65 years old. He is a paraplegic and had a stroke after an overdose. The patient reports that she has a sleep apnea/CPAP device and has to take numerous medications including gabapentin and Xanax. However, she reports that she is  not doing very well.      Interventions Strategy:  Cognitive/behavioral psychotherapeutic interventions  Participation Level:   Active  Participation Quality:  Appropriate      Behavioral Observation:  Well Groomed, Alert, and Appropriate.   Current Psychosocial Factors: The patient reports that she has been doing better with her sleep and working on issues with frustrations she has with husband.  The patient reports that she continues to work on Radiographer, therapeutic.  Content of Session:   We reviewed current symptoms and continue to work on therapeutic interventions for anxiety and depression.  Current Status:   Current status includes significant symptoms of depression and anxiety that are improving.  The patient reports that she continues to see improvement in her depression anxiety.  Patient Progress:   Stable  Target Goals:   Target goals include reducing intensity, severity, and duration of anxiety depression including feelings of helplessness and hopelessness, and overwhelmed by current stressors, fatigue, cognitive difficulties, irritability, agitation cognitive difficulties, and insomnia.  Last Reviewed:   12/30/2013  Goals Addressed Today:    Goals addressed they had to do with building better coping skills specifically around issues of depression anxiety utilizing cognitive/behavioral therapeutic interventions as well as basic coping mechanisms around good diet, exercise, and sleep hygiene.  Impression/Diagnosis:  the patient describes a history of previous depression but development of vertigo-like symptoms along with difficulties with her husband to some degree but to a greater degree of stress with her youngest son and  the stress of taking care of the mother anxiety and depression and become paramount. The patient reports that she has had a great deal of difficulties coping with the stressors which have exacerbated her depression and anxiety.   Diagnosis:    Axis I: Major  depressive disorder, recurrent episode, moderate  Generalized anxiety disorder    Hayly Litsey R, PsyD 12/30/2013

## 2014-01-17 DIAGNOSIS — M6281 Muscle weakness (generalized): Secondary | ICD-10-CM | POA: Diagnosis not present

## 2014-01-17 DIAGNOSIS — R42 Dizziness and giddiness: Secondary | ICD-10-CM | POA: Diagnosis not present

## 2014-01-17 DIAGNOSIS — N3946 Mixed incontinence: Secondary | ICD-10-CM | POA: Diagnosis not present

## 2014-01-17 DIAGNOSIS — R278 Other lack of coordination: Secondary | ICD-10-CM | POA: Diagnosis not present

## 2014-01-20 DIAGNOSIS — Z23 Encounter for immunization: Secondary | ICD-10-CM | POA: Diagnosis not present

## 2014-01-20 DIAGNOSIS — D519 Vitamin B12 deficiency anemia, unspecified: Secondary | ICD-10-CM | POA: Diagnosis not present

## 2014-01-20 DIAGNOSIS — Z1389 Encounter for screening for other disorder: Secondary | ICD-10-CM | POA: Diagnosis not present

## 2014-01-20 DIAGNOSIS — H8110 Benign paroxysmal vertigo, unspecified ear: Secondary | ICD-10-CM | POA: Diagnosis not present

## 2014-01-20 DIAGNOSIS — I10 Essential (primary) hypertension: Secondary | ICD-10-CM | POA: Diagnosis not present

## 2014-01-20 DIAGNOSIS — E782 Mixed hyperlipidemia: Secondary | ICD-10-CM | POA: Diagnosis not present

## 2014-01-20 DIAGNOSIS — F328 Other depressive episodes: Secondary | ICD-10-CM | POA: Diagnosis not present

## 2014-01-30 DIAGNOSIS — N3946 Mixed incontinence: Secondary | ICD-10-CM | POA: Diagnosis not present

## 2014-01-30 DIAGNOSIS — R42 Dizziness and giddiness: Secondary | ICD-10-CM | POA: Diagnosis not present

## 2014-01-30 DIAGNOSIS — M6281 Muscle weakness (generalized): Secondary | ICD-10-CM | POA: Diagnosis not present

## 2014-01-30 DIAGNOSIS — R278 Other lack of coordination: Secondary | ICD-10-CM | POA: Diagnosis not present

## 2014-02-06 ENCOUNTER — Ambulatory Visit (HOSPITAL_COMMUNITY): Payer: Self-pay | Admitting: Psychology

## 2014-02-12 DIAGNOSIS — G43909 Migraine, unspecified, not intractable, without status migrainosus: Secondary | ICD-10-CM | POA: Diagnosis not present

## 2014-02-12 DIAGNOSIS — Z79899 Other long term (current) drug therapy: Secondary | ICD-10-CM | POA: Diagnosis not present

## 2014-02-12 DIAGNOSIS — M797 Fibromyalgia: Secondary | ICD-10-CM | POA: Diagnosis not present

## 2014-02-12 DIAGNOSIS — I10 Essential (primary) hypertension: Secondary | ICD-10-CM | POA: Diagnosis not present

## 2014-02-17 DIAGNOSIS — I10 Essential (primary) hypertension: Secondary | ICD-10-CM | POA: Diagnosis not present

## 2014-02-17 DIAGNOSIS — F328 Other depressive episodes: Secondary | ICD-10-CM | POA: Diagnosis not present

## 2014-02-21 DIAGNOSIS — M6281 Muscle weakness (generalized): Secondary | ICD-10-CM | POA: Diagnosis not present

## 2014-02-21 DIAGNOSIS — R42 Dizziness and giddiness: Secondary | ICD-10-CM | POA: Diagnosis not present

## 2014-02-21 DIAGNOSIS — N3946 Mixed incontinence: Secondary | ICD-10-CM | POA: Diagnosis not present

## 2014-02-21 DIAGNOSIS — R278 Other lack of coordination: Secondary | ICD-10-CM | POA: Diagnosis not present

## 2014-03-01 ENCOUNTER — Encounter (HOSPITAL_COMMUNITY): Payer: Self-pay | Admitting: Psychiatry

## 2014-03-01 ENCOUNTER — Ambulatory Visit (INDEPENDENT_AMBULATORY_CARE_PROVIDER_SITE_OTHER): Payer: Medicare Other | Admitting: Psychiatry

## 2014-03-01 VITALS — BP 143/64 | HR 61 | Ht 65.5 in | Wt 193.0 lb

## 2014-03-01 DIAGNOSIS — F329 Major depressive disorder, single episode, unspecified: Secondary | ICD-10-CM

## 2014-03-01 DIAGNOSIS — F32A Depression, unspecified: Secondary | ICD-10-CM

## 2014-03-01 MED ORDER — ALPRAZOLAM 0.5 MG PO TABS: 0.5000 mg | ORAL_TABLET | Freq: Two times a day (BID) | ORAL | Status: DC | PRN

## 2014-03-01 MED ORDER — DEXTROMETHORPHAN-QUINIDINE 20-10 MG PO CAPS: 20.0000 mg | ORAL_CAPSULE | Freq: Two times a day (BID) | ORAL | Status: DC

## 2014-03-01 MED ORDER — DULOXETINE HCL 60 MG PO CPEP: 60.0000 mg | ORAL_CAPSULE | Freq: Every day | ORAL | Status: DC
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
## 2014-03-08 DIAGNOSIS — R42 Dizziness and giddiness: Secondary | ICD-10-CM | POA: Diagnosis not present

## 2014-03-08 DIAGNOSIS — M6281 Muscle weakness (generalized): Secondary | ICD-10-CM | POA: Diagnosis not present

## 2014-03-08 DIAGNOSIS — R278 Other lack of coordination: Secondary | ICD-10-CM | POA: Diagnosis not present

## 2014-03-09 ENCOUNTER — Telehealth (HOSPITAL_COMMUNITY): Payer: Self-pay | Admitting: *Deleted

## 2014-03-09 NOTE — Telephone Encounter (Signed)
Prior authorization received for Nuedexta. Submitted online with Cover my meds.

## 2014-03-13 DIAGNOSIS — M5432 Sciatica, left side: Secondary | ICD-10-CM | POA: Diagnosis not present

## 2014-03-13 DIAGNOSIS — M222X2 Patellofemoral disorders, left knee: Secondary | ICD-10-CM | POA: Diagnosis not present

## 2014-03-13 DIAGNOSIS — M797 Fibromyalgia: Secondary | ICD-10-CM | POA: Diagnosis not present

## 2014-03-13 DIAGNOSIS — M25562 Pain in left knee: Secondary | ICD-10-CM | POA: Diagnosis not present

## 2014-03-14 ENCOUNTER — Telehealth (HOSPITAL_COMMUNITY): Payer: Self-pay | Admitting: *Deleted

## 2014-03-14 NOTE — Telephone Encounter (Signed)
Prior Authorization had been denied for Nuedexta. Appeal process started with [place] by phone at [phone]. Next step is to fax letter to insurance company at [phone] with MD note for supporting document.

## 2014-03-15 DIAGNOSIS — M6281 Muscle weakness (generalized): Secondary | ICD-10-CM | POA: Diagnosis not present

## 2014-03-15 DIAGNOSIS — R42 Dizziness and giddiness: Secondary | ICD-10-CM | POA: Diagnosis not present

## 2014-03-15 DIAGNOSIS — M62838 Other muscle spasm: Secondary | ICD-10-CM | POA: Diagnosis not present

## 2014-03-15 DIAGNOSIS — R278 Other lack of coordination: Secondary | ICD-10-CM | POA: Diagnosis not present

## 2014-03-29 ENCOUNTER — Encounter (HOSPITAL_COMMUNITY): Payer: Self-pay | Admitting: Psychiatry

## 2014-03-29 ENCOUNTER — Ambulatory Visit (INDEPENDENT_AMBULATORY_CARE_PROVIDER_SITE_OTHER): Payer: Medicare Other | Admitting: Psychiatry

## 2014-03-29 VITALS — BP 151/63 | HR 58 | Ht 65.5 in | Wt 189.0 lb

## 2014-03-29 DIAGNOSIS — F411 Generalized anxiety disorder: Secondary | ICD-10-CM | POA: Diagnosis not present

## 2014-03-29 DIAGNOSIS — N3946 Mixed incontinence: Secondary | ICD-10-CM | POA: Diagnosis not present

## 2014-03-29 DIAGNOSIS — F331 Major depressive disorder, recurrent, moderate: Secondary | ICD-10-CM

## 2014-03-29 DIAGNOSIS — R42 Dizziness and giddiness: Secondary | ICD-10-CM | POA: Diagnosis not present

## 2014-03-29 DIAGNOSIS — R278 Other lack of coordination: Secondary | ICD-10-CM | POA: Diagnosis not present

## 2014-03-29 DIAGNOSIS — M62838 Other muscle spasm: Secondary | ICD-10-CM | POA: Diagnosis not present

## 2014-03-29 MED ORDER — METHYLPHENIDATE HCL 10 MG PO TABS: 10.0000 mg | ORAL_TABLET | Freq: Two times a day (BID) | ORAL | Status: DC
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
## 2014-04-11 DIAGNOSIS — R42 Dizziness and giddiness: Secondary | ICD-10-CM | POA: Diagnosis not present

## 2014-04-11 DIAGNOSIS — M62838 Other muscle spasm: Secondary | ICD-10-CM | POA: Diagnosis not present

## 2014-04-11 DIAGNOSIS — M6281 Muscle weakness (generalized): Secondary | ICD-10-CM | POA: Diagnosis not present

## 2014-04-11 DIAGNOSIS — R278 Other lack of coordination: Secondary | ICD-10-CM | POA: Diagnosis not present

## 2014-04-25 DIAGNOSIS — M6281 Muscle weakness (generalized): Secondary | ICD-10-CM | POA: Diagnosis not present

## 2014-04-25 DIAGNOSIS — M62838 Other muscle spasm: Secondary | ICD-10-CM | POA: Diagnosis not present

## 2014-04-25 DIAGNOSIS — R278 Other lack of coordination: Secondary | ICD-10-CM | POA: Diagnosis not present

## 2014-04-25 DIAGNOSIS — R42 Dizziness and giddiness: Secondary | ICD-10-CM | POA: Diagnosis not present

## 2014-04-26 ENCOUNTER — Encounter (HOSPITAL_COMMUNITY): Payer: Self-pay | Admitting: Psychiatry

## 2014-04-26 ENCOUNTER — Ambulatory Visit (INDEPENDENT_AMBULATORY_CARE_PROVIDER_SITE_OTHER): Payer: Medicare Other | Admitting: Psychiatry

## 2014-04-26 VITALS — BP 148/91 | HR 57 | Ht 65.5 in | Wt 191.4 lb

## 2014-04-26 DIAGNOSIS — F329 Major depressive disorder, single episode, unspecified: Secondary | ICD-10-CM

## 2014-04-26 DIAGNOSIS — F331 Major depressive disorder, recurrent, moderate: Secondary | ICD-10-CM

## 2014-04-26 MED ORDER — METHYLPHENIDATE HCL 20 MG PO TABS: 20.0000 mg | ORAL_TABLET | Freq: Two times a day (BID) | ORAL | Status: DC

## 2014-04-26 MED ORDER — DULOXETINE HCL 60 MG PO CPEP: 60.0000 mg | ORAL_CAPSULE | Freq: Every day | ORAL | Status: DC

## 2014-04-26 MED ORDER — ALPRAZOLAM 0.5 MG PO TABS: 0.5000 mg | ORAL_TABLET | Freq: Two times a day (BID) | ORAL | Status: DC | PRN
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
## 2014-04-28 ENCOUNTER — Encounter: Payer: Self-pay | Admitting: Diagnostic Neuroimaging

## 2014-04-28 ENCOUNTER — Ambulatory Visit (INDEPENDENT_AMBULATORY_CARE_PROVIDER_SITE_OTHER): Payer: Medicare Other | Admitting: Diagnostic Neuroimaging

## 2014-04-28 VITALS — BP 151/82 | HR 52 | Ht 65.5 in | Wt 196.4 lb

## 2014-04-28 DIAGNOSIS — R269 Unspecified abnormalities of gait and mobility: Secondary | ICD-10-CM

## 2014-04-28 DIAGNOSIS — R42 Dizziness and giddiness: Secondary | ICD-10-CM | POA: Diagnosis not present

## 2014-04-28 DIAGNOSIS — R200 Anesthesia of skin: Secondary | ICD-10-CM

## 2014-04-28 NOTE — Patient Instructions (Signed)
I will check additional testing. 
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
## 2014-05-02 DIAGNOSIS — M6281 Muscle weakness (generalized): Secondary | ICD-10-CM | POA: Diagnosis not present

## 2014-05-02 DIAGNOSIS — H2513 Age-related nuclear cataract, bilateral: Secondary | ICD-10-CM | POA: Diagnosis not present

## 2014-05-02 DIAGNOSIS — R42 Dizziness and giddiness: Secondary | ICD-10-CM | POA: Diagnosis not present

## 2014-05-02 DIAGNOSIS — M62838 Other muscle spasm: Secondary | ICD-10-CM | POA: Diagnosis not present

## 2014-05-02 DIAGNOSIS — R278 Other lack of coordination: Secondary | ICD-10-CM | POA: Diagnosis not present

## 2014-05-03 DIAGNOSIS — M23342 Other meniscus derangements, anterior horn of lateral meniscus, left knee: Secondary | ICD-10-CM | POA: Diagnosis not present

## 2014-05-03 DIAGNOSIS — M25862 Other specified joint disorders, left knee: Secondary | ICD-10-CM | POA: Diagnosis not present

## 2014-05-03 DIAGNOSIS — S83282A Other tear of lateral meniscus, current injury, left knee, initial encounter: Secondary | ICD-10-CM | POA: Diagnosis not present

## 2014-05-03 DIAGNOSIS — M25462 Effusion, left knee: Secondary | ICD-10-CM | POA: Diagnosis not present

## 2014-05-03 DIAGNOSIS — M23322 Other meniscus derangements, posterior horn of medial meniscus, left knee: Secondary | ICD-10-CM | POA: Diagnosis not present

## 2014-05-03 DIAGNOSIS — S83232A Complex tear of medial meniscus, current injury, left knee, initial encounter: Secondary | ICD-10-CM | POA: Diagnosis not present

## 2014-05-10 DIAGNOSIS — S83242D Other tear of medial meniscus, current injury, left knee, subsequent encounter: Secondary | ICD-10-CM | POA: Diagnosis not present

## 2014-05-10 DIAGNOSIS — M797 Fibromyalgia: Secondary | ICD-10-CM | POA: Diagnosis not present

## 2014-05-10 DIAGNOSIS — M1712 Unilateral primary osteoarthritis, left knee: Secondary | ICD-10-CM | POA: Diagnosis not present

## 2014-05-10 DIAGNOSIS — M222X2 Patellofemoral disorders, left knee: Secondary | ICD-10-CM | POA: Diagnosis not present

## 2014-05-12 ENCOUNTER — Ambulatory Visit
Admission: RE | Admit: 2014-05-12 | Discharge: 2014-05-12 | Disposition: A | Discharge status: Home / Self Care | Payer: Medicare Other | Source: Ambulatory Visit | Attending: Diagnostic Neuroimaging | Admitting: Diagnostic Neuroimaging

## 2014-05-12 DIAGNOSIS — R42 Dizziness and giddiness: Secondary | ICD-10-CM

## 2014-05-12 DIAGNOSIS — R200 Anesthesia of skin: Secondary | ICD-10-CM

## 2014-05-12 DIAGNOSIS — R269 Unspecified abnormalities of gait and mobility: Secondary | ICD-10-CM

## 2014-05-12 MED ORDER — GADOBENATE DIMEGLUMINE 529 MG/ML IV SOLN
18.0000 mL | Freq: Once | INTRAVENOUS | Status: AC | PRN
  Administered 2014-05-12: 18 mL via INTRAVENOUS

## 2014-05-16 ENCOUNTER — Telehealth: Payer: Self-pay | Admitting: Diagnostic Neuroimaging

## 2014-05-16 NOTE — Telephone Encounter (Signed)
Patient called wanting the results for her MRI she had on Friday 05/12/14.  Please call and advice # [phone]

## 2014-05-16 NOTE — Telephone Encounter (Signed)
Pt asking for test results to be mailed to her home and that Dr. Mamie Nick suggest or prescribe pain medication for her

## 2014-05-16 NOTE — Telephone Encounter (Signed)
Spoke to the pt on the phone after sending Dr. Leta Baptist a message asking about pain medication. He asked me to inform the pt that she would need to follow up with PCP for pain management. Pt became tearful and stated that she got off of her medications and hated the thought of having something strong to control her pain. I encouraged her and told her that the doctor would not give her anything that she did not want to take. She thanked me and stated an understanding

## 2014-05-17 DIAGNOSIS — M62838 Other muscle spasm: Secondary | ICD-10-CM | POA: Diagnosis not present

## 2014-05-17 DIAGNOSIS — R42 Dizziness and giddiness: Secondary | ICD-10-CM | POA: Diagnosis not present

## 2014-05-17 DIAGNOSIS — M6281 Muscle weakness (generalized): Secondary | ICD-10-CM | POA: Diagnosis not present

## 2014-05-17 DIAGNOSIS — R278 Other lack of coordination: Secondary | ICD-10-CM | POA: Diagnosis not present

## 2014-05-26 DIAGNOSIS — M1712 Unilateral primary osteoarthritis, left knee: Secondary | ICD-10-CM | POA: Diagnosis not present

## 2014-05-26 DIAGNOSIS — H9190 Unspecified hearing loss, unspecified ear: Secondary | ICD-10-CM | POA: Diagnosis not present

## 2014-05-26 DIAGNOSIS — G479 Sleep disorder, unspecified: Secondary | ICD-10-CM | POA: Diagnosis not present

## 2014-05-26 DIAGNOSIS — G4730 Sleep apnea, unspecified: Secondary | ICD-10-CM | POA: Diagnosis not present

## 2014-05-26 DIAGNOSIS — M94262 Chondromalacia, left knee: Secondary | ICD-10-CM | POA: Diagnosis not present

## 2014-05-26 DIAGNOSIS — I10 Essential (primary) hypertension: Secondary | ICD-10-CM | POA: Diagnosis not present

## 2014-05-26 DIAGNOSIS — Z808 Family history of malignant neoplasm of other organs or systems: Secondary | ICD-10-CM | POA: Diagnosis not present

## 2014-05-26 DIAGNOSIS — Z79899 Other long term (current) drug therapy: Secondary | ICD-10-CM | POA: Diagnosis not present

## 2014-05-26 DIAGNOSIS — S83242A Other tear of medial meniscus, current injury, left knee, initial encounter: Secondary | ICD-10-CM | POA: Diagnosis not present

## 2014-05-26 DIAGNOSIS — K589 Irritable bowel syndrome without diarrhea: Secondary | ICD-10-CM | POA: Diagnosis not present

## 2014-05-26 DIAGNOSIS — M222X2 Patellofemoral disorders, left knee: Secondary | ICD-10-CM | POA: Diagnosis not present

## 2014-05-26 DIAGNOSIS — Z825 Family history of asthma and other chronic lower respiratory diseases: Secondary | ICD-10-CM | POA: Diagnosis not present

## 2014-05-26 DIAGNOSIS — Z8249 Family history of ischemic heart disease and other diseases of the circulatory system: Secondary | ICD-10-CM | POA: Diagnosis not present

## 2014-05-26 DIAGNOSIS — Z79890 Hormone replacement therapy (postmenopausal): Secondary | ICD-10-CM | POA: Diagnosis not present

## 2014-05-26 DIAGNOSIS — F329 Major depressive disorder, single episode, unspecified: Secondary | ICD-10-CM | POA: Diagnosis not present

## 2014-05-26 DIAGNOSIS — Z833 Family history of diabetes mellitus: Secondary | ICD-10-CM | POA: Diagnosis not present

## 2014-05-26 DIAGNOSIS — F419 Anxiety disorder, unspecified: Secondary | ICD-10-CM | POA: Diagnosis not present

## 2014-05-26 DIAGNOSIS — M1990 Unspecified osteoarthritis, unspecified site: Secondary | ICD-10-CM | POA: Diagnosis not present

## 2014-05-26 DIAGNOSIS — M797 Fibromyalgia: Secondary | ICD-10-CM | POA: Diagnosis not present

## 2014-05-26 HISTORY — PX: KNEE SURGERY: SHX244

## 2014-05-29 ENCOUNTER — Other Ambulatory Visit: Payer: Self-pay | Admitting: Obstetrics and Gynecology

## 2014-05-30 NOTE — Telephone Encounter (Signed)
refil estradiol x 90 days.  Needs appt before future refils

## 2014-06-13 DIAGNOSIS — H8110 Benign paroxysmal vertigo, unspecified ear: Secondary | ICD-10-CM | POA: Diagnosis not present

## 2014-06-13 DIAGNOSIS — F328 Other depressive episodes: Secondary | ICD-10-CM | POA: Diagnosis not present

## 2014-06-13 DIAGNOSIS — I10 Essential (primary) hypertension: Secondary | ICD-10-CM | POA: Diagnosis not present

## 2014-06-15 DIAGNOSIS — Z9889 Other specified postprocedural states: Secondary | ICD-10-CM | POA: Diagnosis not present

## 2014-06-15 DIAGNOSIS — M6281 Muscle weakness (generalized): Secondary | ICD-10-CM | POA: Diagnosis not present

## 2014-06-15 DIAGNOSIS — R269 Unspecified abnormalities of gait and mobility: Secondary | ICD-10-CM | POA: Diagnosis not present

## 2014-06-15 DIAGNOSIS — M25562 Pain in left knee: Secondary | ICD-10-CM | POA: Diagnosis not present

## 2014-06-19 DIAGNOSIS — R269 Unspecified abnormalities of gait and mobility: Secondary | ICD-10-CM | POA: Diagnosis not present

## 2014-06-19 DIAGNOSIS — M25562 Pain in left knee: Secondary | ICD-10-CM | POA: Diagnosis not present

## 2014-06-19 DIAGNOSIS — Z9889 Other specified postprocedural states: Secondary | ICD-10-CM | POA: Diagnosis not present

## 2014-06-19 DIAGNOSIS — M6281 Muscle weakness (generalized): Secondary | ICD-10-CM | POA: Diagnosis not present

## 2014-06-21 DIAGNOSIS — M6281 Muscle weakness (generalized): Secondary | ICD-10-CM | POA: Diagnosis not present

## 2014-06-21 DIAGNOSIS — Z9889 Other specified postprocedural states: Secondary | ICD-10-CM | POA: Diagnosis not present

## 2014-06-21 DIAGNOSIS — M25562 Pain in left knee: Secondary | ICD-10-CM | POA: Diagnosis not present

## 2014-06-21 DIAGNOSIS — R269 Unspecified abnormalities of gait and mobility: Secondary | ICD-10-CM | POA: Diagnosis not present

## 2014-06-23 DIAGNOSIS — Z9889 Other specified postprocedural states: Secondary | ICD-10-CM | POA: Diagnosis not present

## 2014-06-23 DIAGNOSIS — M6281 Muscle weakness (generalized): Secondary | ICD-10-CM | POA: Diagnosis not present

## 2014-06-23 DIAGNOSIS — M25562 Pain in left knee: Secondary | ICD-10-CM | POA: Diagnosis not present

## 2014-06-23 DIAGNOSIS — R269 Unspecified abnormalities of gait and mobility: Secondary | ICD-10-CM | POA: Diagnosis not present

## 2014-06-26 DIAGNOSIS — M25562 Pain in left knee: Secondary | ICD-10-CM | POA: Diagnosis not present

## 2014-06-26 DIAGNOSIS — M6281 Muscle weakness (generalized): Secondary | ICD-10-CM | POA: Diagnosis not present

## 2014-06-26 DIAGNOSIS — R269 Unspecified abnormalities of gait and mobility: Secondary | ICD-10-CM | POA: Diagnosis not present

## 2014-06-26 DIAGNOSIS — Z9889 Other specified postprocedural states: Secondary | ICD-10-CM | POA: Diagnosis not present

## 2014-06-27 ENCOUNTER — Ambulatory Visit (INDEPENDENT_AMBULATORY_CARE_PROVIDER_SITE_OTHER): Payer: Medicare Other | Admitting: Psychiatry

## 2014-06-27 ENCOUNTER — Encounter (HOSPITAL_COMMUNITY): Payer: Self-pay | Admitting: Psychiatry

## 2014-06-27 VITALS — BP 116/54 | HR 66 | Ht 65.5 in | Wt 187.4 lb

## 2014-06-27 DIAGNOSIS — F331 Major depressive disorder, recurrent, moderate: Secondary | ICD-10-CM

## 2014-06-27 MED ORDER — ALPRAZOLAM 0.5 MG PO TABS: 0.5000 mg | ORAL_TABLET | Freq: Two times a day (BID) | ORAL | Status: DC | PRN

## 2014-06-27 MED ORDER — METHYLPHENIDATE HCL 20 MG PO TABS: 20.0000 mg | ORAL_TABLET | Freq: Two times a day (BID) | ORAL | Status: DC

## 2014-06-27 MED ORDER — DULOXETINE HCL 60 MG PO CPEP: 60.0000 mg | ORAL_CAPSULE | Freq: Every day | ORAL | Status: DC
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
## 2014-06-28 DIAGNOSIS — M6281 Muscle weakness (generalized): Secondary | ICD-10-CM | POA: Diagnosis not present

## 2014-06-28 DIAGNOSIS — M25562 Pain in left knee: Secondary | ICD-10-CM | POA: Diagnosis not present

## 2014-06-28 DIAGNOSIS — R269 Unspecified abnormalities of gait and mobility: Secondary | ICD-10-CM | POA: Diagnosis not present

## 2014-06-28 DIAGNOSIS — Z9889 Other specified postprocedural states: Secondary | ICD-10-CM | POA: Diagnosis not present

## 2014-06-30 DIAGNOSIS — M25562 Pain in left knee: Secondary | ICD-10-CM | POA: Diagnosis not present

## 2014-06-30 DIAGNOSIS — M6281 Muscle weakness (generalized): Secondary | ICD-10-CM | POA: Diagnosis not present

## 2014-06-30 DIAGNOSIS — R269 Unspecified abnormalities of gait and mobility: Secondary | ICD-10-CM | POA: Diagnosis not present

## 2014-06-30 DIAGNOSIS — Z9889 Other specified postprocedural states: Secondary | ICD-10-CM | POA: Diagnosis not present

## 2014-07-03 DIAGNOSIS — M6281 Muscle weakness (generalized): Secondary | ICD-10-CM | POA: Diagnosis not present

## 2014-07-03 DIAGNOSIS — R269 Unspecified abnormalities of gait and mobility: Secondary | ICD-10-CM | POA: Diagnosis not present

## 2014-07-03 DIAGNOSIS — Z9889 Other specified postprocedural states: Secondary | ICD-10-CM | POA: Diagnosis not present

## 2014-07-03 DIAGNOSIS — M25562 Pain in left knee: Secondary | ICD-10-CM | POA: Diagnosis not present

## 2014-07-05 DIAGNOSIS — R269 Unspecified abnormalities of gait and mobility: Secondary | ICD-10-CM | POA: Diagnosis not present

## 2014-07-05 DIAGNOSIS — M6281 Muscle weakness (generalized): Secondary | ICD-10-CM | POA: Diagnosis not present

## 2014-07-05 DIAGNOSIS — M25562 Pain in left knee: Secondary | ICD-10-CM | POA: Diagnosis not present

## 2014-07-05 DIAGNOSIS — Z9889 Other specified postprocedural states: Secondary | ICD-10-CM | POA: Diagnosis not present

## 2014-07-07 ENCOUNTER — Encounter: Payer: Self-pay | Admitting: Obstetrics and Gynecology

## 2014-07-07 ENCOUNTER — Ambulatory Visit (INDEPENDENT_AMBULATORY_CARE_PROVIDER_SITE_OTHER): Payer: Medicare Other | Admitting: Obstetrics and Gynecology

## 2014-07-07 VITALS — BP 160/100 | Ht 65.5 in | Wt 187.0 lb

## 2014-07-07 DIAGNOSIS — K625 Hemorrhage of anus and rectum: Secondary | ICD-10-CM

## 2014-07-07 DIAGNOSIS — M6281 Muscle weakness (generalized): Secondary | ICD-10-CM | POA: Diagnosis not present

## 2014-07-07 DIAGNOSIS — R269 Unspecified abnormalities of gait and mobility: Secondary | ICD-10-CM | POA: Diagnosis not present

## 2014-07-07 DIAGNOSIS — Z9889 Other specified postprocedural states: Secondary | ICD-10-CM | POA: Diagnosis not present

## 2014-07-07 DIAGNOSIS — M25562 Pain in left knee: Secondary | ICD-10-CM | POA: Diagnosis not present

## 2014-07-07 NOTE — Progress Notes (Signed)
Patient ID: [name], female   DOB: January 26, 1950, 64 y.o.   MRN: [id number] Pt here today for rectal bleeding. Pt states that she has had some issues with her stomach for a few months and has sen the bleeding as well. Pt states that she is having a lot of nausea as well as the vertigo issues.

## 2014-07-07 NOTE — Progress Notes (Signed)
I have submitted this to [facility] for scheduling.

## 2014-07-07 NOTE — Progress Notes (Addendum)
Patient ID: [name], female   DOB: 1950-09-09, 64 y.o.   MRN: [id number]    [facility] Visit  Patient name: [name] MRN [id number]  Date of birth: 11-16-1950  CC & HPI:  [name] is a 64 y.o. female  s/p abdominal hysterectomy and salpingo oophorectomy, with a history of HTN, depression, anxiety and vertigo, presenting today for intermittent rectal bleeding that occurs with BM. She describes blood in stool as some dark blood within the stool with some bloody mucous following BM. Pt notes mild, intermittent abdominal pain and nausea that started a few months ago. She has palpated her abdomen and noticed some hard knots. Pt sees a psychiatrist and a Marketing executive regarding stressors, including financial difficulties and health issues.   She has a history of ovarian cysts., is s/p hyst , s/p oophorectomy  ROS:  A complete 10 system review of systems was obtained and all systems are negative except as noted in the HPI and PMH.   Pertinent History Reviewed:   Reviewed: Significant for abdominal hysterectomy and salpingo oophorectomy; interstitial cystitis  Medical         Past Medical History  Diagnosis Date  . Hypertension   . Cystitides, interstitial, chronic   . Fibromyalgia   . Urinary tract infection   . Urinary disorder     bladder is in sling per pt  . High cholesterol   . Back pain   . Anxiety   . Depression   . Shortness of breath   . GERD (gastroesophageal reflux disease)   . Vertigo   . Sleep apnea                               Surgical Hx:    Past Surgical History  Procedure Laterality Date  . Abdominal hysterectomy  1997    [facility] [place]    . Toe surgery    . Bladder tack    . Laparoscopic salpingo oopherectomy  02/17/2012    Procedure: LAPAROSCOPIC SALPINGO OOPHORECTOMY;  Surgeon: Jonnie Kind, MD;  Location: [facility];  Service: Gynecology;  Laterality: Bilateral;  . Knee surgery Left    Medications:  Reviewed & Updated - see associated section                       Current outpatient prescriptions:  .  ALPRAZolam (XANAX) 0.5 MG tablet, Take 1 tablet (0.5 mg total) by mouth 2 (two) times daily as needed for anxiety., Disp: 60 tablet, Rfl: 2 .  amLODipine (NORVASC) 10 MG tablet, Take 10 mg by mouth daily., Disp: , Rfl:  .  cetirizine (ZYRTEC) 10 MG tablet, Take 10 mg by mouth daily as needed. Allergies, Disp: , Rfl:  .  cloNIDine (CATAPRES) 0.1 MG tablet, Take 0.1 mg by mouth daily. , Disp: , Rfl:  .  DULoxetine (CYMBALTA) 60 MG capsule, Take 1 capsule (60 mg total) by mouth daily., Disp: 30 capsule, Rfl: 2 .  estradiol (ESTRACE) 2 MG tablet, TAKE ONE TABLET BY MOUTH ONCE DAILY., Disp: 90 tablet, Rfl: 0 .  fluticasone (FLONASE) 50 MCG/ACT nasal spray, Place 1 spray into both nostrils daily as needed. , Disp: , Rfl:  .  ibuprofen (ADVIL,MOTRIN) 200 MG tablet, Take 800 mg by mouth every 6 (six) hours as needed. Pain, Disp: , Rfl:  .  lisinopril-hydrochlorothiazide (PRINZIDE,ZESTORETIC) 20-12.5 MG per tablet, Take 1  tablet by mouth 2 (two) times daily., Disp: , Rfl:  .  Methenamine-Sodium Salicylate (CYSTEX) 277-412.8 MG TABS, Take 2 tablets by mouth daily as needed. For urinary pain, Disp: , Rfl:  .  methylphenidate (RITALIN) 20 MG tablet, Take 1 tablet (20 mg total) by mouth 2 (two) times daily with breakfast and lunch., Disp: 60 tablet, Rfl: 0 .  metoprolol (LOPRESSOR) 100 MG tablet, Take 100 mg by mouth 2 (two) times daily. , Disp: , Rfl:  .  Probiotic Product (PROBIOTIC DAILY PO), Take by mouth daily as needed., Disp: , Rfl:  .  simvastatin (ZOCOR) 20 MG tablet, Take 20 mg by mouth every evening., Disp: , Rfl:  .  oxyCODONE-acetaminophen (PERCOCET/ROXICET) 5-325 MG per tablet, Take 1 tablet by mouth 3 (three) times daily as needed. , Disp: , Rfl:    Social History: Reviewed -  reports that she has never smoked. She has never used smokeless tobacco.  Objective Findings:  Vitals: Blood  pressure 160/100, height 5' 5.5" (1.664 m), weight 187 lb (84.823 kg).  Physical Examination: General appearance - alert, well appearing, and in no distress and oriented to person, place, and time Mental status - alert, oriented to person, place, and time, normal mood, behavior, speech, dress, motor activity, and thought processes Abdominal-soft, nontender; no masses noted Pelvic - normal external genitalia, vulva, vagina VULVA: normal appearing vulva with no masses, tenderness or lesions VAGINA: normal appearing vagina with normal color and discharge, no lesions CERVIX: surgically absent UTERUS: surgically absent, vaginal cuff well healed ADNEXA: surgically absent bilateral  RECTAL: normal rectal, no masses; external rectal areas without fissures; noted normal stool; guaiac positive stool obtained Hemoccult: Positive  Assessment & Plan:   A:  1. Rectal bleeding that occurs with BM 2. Hemoccult positive for bleeding  P:  1. Follow-up with GI specialist for colonoscopy , refer to Dr Laural Golden. Spoke with Deberah Castle. Pt to be called for appt. 2. Cbc     This chart was scribed for Jonnie Kind, MD by Tula Nakayama, ED Scribe. This patient was seen in room 2 and the patient's care was started at 10:16 AM.   I personally performed the services described in this documentation, which was SCRIBED in my presence. The recorded information has been reviewed and considered accurate. It has been edited as necessary during review. Jonnie Kind, MD

## 2014-07-08 LAB — CBC
Hematocrit: 37.2 % (ref 34.0–46.6)
Hemoglobin: 12.5 g/dL (ref 11.1–15.9)
MCH: 29.8 pg (ref 26.6–33.0)
MCHC: 33.6 g/dL (ref 31.5–35.7)
MCV: 89 fL (ref 79–97)
PLATELETS: 292 10*3/uL (ref 150–379)
RBC: 4.19 x10E6/uL (ref 3.77–5.28)
RDW: 13.7 % (ref 12.3–15.4)
WBC: 10 10*3/uL (ref 3.4–10.8)

## 2014-07-10 DIAGNOSIS — M25562 Pain in left knee: Secondary | ICD-10-CM | POA: Diagnosis not present

## 2014-07-10 DIAGNOSIS — Z9889 Other specified postprocedural states: Secondary | ICD-10-CM | POA: Diagnosis not present

## 2014-07-10 DIAGNOSIS — R269 Unspecified abnormalities of gait and mobility: Secondary | ICD-10-CM | POA: Diagnosis not present

## 2014-07-10 DIAGNOSIS — M6281 Muscle weakness (generalized): Secondary | ICD-10-CM | POA: Diagnosis not present

## 2014-07-11 ENCOUNTER — Encounter (INDEPENDENT_AMBULATORY_CARE_PROVIDER_SITE_OTHER): Payer: Self-pay | Admitting: *Deleted

## 2014-07-11 DIAGNOSIS — M6281 Muscle weakness (generalized): Secondary | ICD-10-CM | POA: Diagnosis not present

## 2014-07-11 DIAGNOSIS — M25562 Pain in left knee: Secondary | ICD-10-CM | POA: Diagnosis not present

## 2014-07-11 DIAGNOSIS — Z9889 Other specified postprocedural states: Secondary | ICD-10-CM | POA: Diagnosis not present

## 2014-07-11 DIAGNOSIS — R269 Unspecified abnormalities of gait and mobility: Secondary | ICD-10-CM | POA: Diagnosis not present

## 2014-07-13 DIAGNOSIS — Z9889 Other specified postprocedural states: Secondary | ICD-10-CM | POA: Diagnosis not present

## 2014-07-13 DIAGNOSIS — R269 Unspecified abnormalities of gait and mobility: Secondary | ICD-10-CM | POA: Diagnosis not present

## 2014-07-13 DIAGNOSIS — M6281 Muscle weakness (generalized): Secondary | ICD-10-CM | POA: Diagnosis not present

## 2014-07-13 DIAGNOSIS — M25562 Pain in left knee: Secondary | ICD-10-CM | POA: Diagnosis not present

## 2014-07-19 DIAGNOSIS — R269 Unspecified abnormalities of gait and mobility: Secondary | ICD-10-CM | POA: Diagnosis not present

## 2014-07-19 DIAGNOSIS — M25562 Pain in left knee: Secondary | ICD-10-CM | POA: Diagnosis not present

## 2014-07-19 DIAGNOSIS — M6281 Muscle weakness (generalized): Secondary | ICD-10-CM | POA: Diagnosis not present

## 2014-07-21 DIAGNOSIS — M25562 Pain in left knee: Secondary | ICD-10-CM | POA: Diagnosis not present

## 2014-07-21 DIAGNOSIS — M6281 Muscle weakness (generalized): Secondary | ICD-10-CM | POA: Diagnosis not present

## 2014-07-21 DIAGNOSIS — R269 Unspecified abnormalities of gait and mobility: Secondary | ICD-10-CM | POA: Diagnosis not present

## 2014-07-24 DIAGNOSIS — M6281 Muscle weakness (generalized): Secondary | ICD-10-CM | POA: Diagnosis not present

## 2014-07-24 DIAGNOSIS — R269 Unspecified abnormalities of gait and mobility: Secondary | ICD-10-CM | POA: Diagnosis not present

## 2014-07-24 DIAGNOSIS — M25562 Pain in left knee: Secondary | ICD-10-CM | POA: Diagnosis not present

## 2014-07-25 ENCOUNTER — Encounter (INDEPENDENT_AMBULATORY_CARE_PROVIDER_SITE_OTHER): Payer: Self-pay | Admitting: Internal Medicine

## 2014-07-25 ENCOUNTER — Ambulatory Visit (INDEPENDENT_AMBULATORY_CARE_PROVIDER_SITE_OTHER): Payer: Medicare Other | Admitting: Internal Medicine

## 2014-07-25 VITALS — BP 126/62 | HR 72 | Temp 98.1°F | Ht 65.5 in | Wt 186.2 lb

## 2014-07-25 DIAGNOSIS — R197 Diarrhea, unspecified: Secondary | ICD-10-CM

## 2014-07-25 NOTE — Progress Notes (Addendum)
Subjective:    Patient ID: [name], female    DOB: 02-25-1950, 64 y.o.   MRN: [id number]  HPI Referred to our office by Dr. Glo Herring for +stool card. Patient was seen by Dr. Glo Herring last month for rectal bleeding.   Noted to be hemocult positive. She tells me she used Circumin and ? Something for her liver to cleanse her body. She tells me she became sick after taking 2 pills. After taking the pills  she passed a fist full of tissue and blood from her rectum. She says she had a BM which looked liked tissue and blood. No bleeding in about 3 weeks.    She tells me she alternates between constipation and diarrhea.  She has a hx of a fecal impaction in 2013 and was seen at [facility].  Since her surgery in 2013, she leans more toward constipation.  Appetite is okay. She says she has lost weight. She has lost 10-12 pounds over the last couple of months.  She is trying to eat healthier. She usually has a BM one a day. No melena or BRRB recently.  In a week, she may have diarrhea once.   She tells me that this Saturday night she had fecal incontinence in her bed.    She has tried a gluten free diet which did not make a difference. Patient concerned about Celiac disease.   Colonoscopy 02/25/2013 Dr. Britta Mccreedy, [facility]  Colonoscopy with snare polypectomy: Two polyps were found in the sigmoid colon. Removed. Retroflexed views in the rectum revealed no abnormalities. Biopsy: Sigmoid biopsy: tubular adenoma.  Colon biopsy Sigmoid: Benign colonic epithelium. Quality of prep was excellent. CBC    Component Value Date/Time   WBC 10.0 07/07/2014 1417   WBC 7.4 03/05/2012 1128   RBC 4.19 07/07/2014 1417   RBC 3.85* 03/05/2012 1128   HGB 11.7* 03/05/2012 1128   HCT 37.2 07/07/2014 1417   HCT 34.1* 03/05/2012 1128   PLT 294 03/05/2012 1128   MCV 88.6 03/05/2012 1128   MCH 29.8 07/07/2014 1417   MCH 30.4 03/05/2012 1128   MCHC 33.6 07/07/2014 1417   MCHC 34.3 03/05/2012 1128   RDW 13.7 07/07/2014 1417   RDW 12.3 03/05/2012 1128   LYMPHSABS 2.1 03/05/2012 1128   MONOABS 0.4 03/05/2012 1128   EOSABS 0.4 03/05/2012 1128   BASOSABS 0.1 03/05/2012 1128        Review of Systems Past Medical History  Diagnosis Date  . Hypertension   . Cystitides, interstitial, chronic   . Fibromyalgia   . Urinary tract infection   . Urinary disorder     bladder is in sling per pt  . High cholesterol   . Back pain   . Anxiety   . Depression   . Shortness of breath   . GERD (gastroesophageal reflux disease)   . Vertigo   . Sleep apnea     Past Surgical History  Procedure Laterality Date  . Abdominal hysterectomy  1997    [facility] , [place]    . Toe surgery    . Bladder tack    . Laparoscopic salpingo oopherectomy  02/17/2012    Procedure: LAPAROSCOPIC SALPINGO OOPHORECTOMY;  Surgeon: Jonnie Kind, MD;  Location: [facility];  Service: Gynecology;  Laterality: Bilateral;  . Knee surgery Left     May  13,2016    Allergies  Allergen Reactions  . Ciprofloxacin Diarrhea    Intolerance. Patient stated that a lot of antiobiotics give  her diarrhea.    Current Outpatient Prescriptions on File Prior to Visit  Medication Sig Dispense Refill  . ALPRAZolam (XANAX) 0.5 MG tablet Take 1 tablet (0.5 mg total) by mouth 2 (two) times daily as needed for anxiety. 60 tablet 2  . amLODipine (NORVASC) 10 MG tablet Take 10 mg by mouth daily.    . cetirizine (ZYRTEC) 10 MG tablet Take 10 mg by mouth daily as needed. Allergies    . cloNIDine (CATAPRES) 0.1 MG tablet Take 0.1 mg by mouth daily.     . DULoxetine (CYMBALTA) 60 MG capsule Take 1 capsule (60 mg total) by mouth daily. 30 capsule 2  . estradiol (ESTRACE) 2 MG tablet TAKE ONE TABLET BY MOUTH ONCE DAILY. 90 tablet 0  . fluticasone (FLONASE) 50 MCG/ACT nasal spray Place 1 spray into both nostrils daily.     Marland Kitchen ibuprofen (ADVIL,MOTRIN) 200 MG tablet Take 800 mg by mouth every 6 (six) hours as needed. Pain    . lisinopril-hydrochlorothiazide  (PRINZIDE,ZESTORETIC) 20-12.5 MG per tablet Take 1 tablet by mouth 2 (two) times daily.    . Methenamine-Sodium Salicylate (CYSTEX) 937-342.8 MG TABS Take 2 tablets by mouth daily as needed. For urinary pain    . methylphenidate (RITALIN) 20 MG tablet Take 1 tablet (20 mg total) by mouth 2 (two) times daily with breakfast and lunch. (Patient taking differently: Take 20 mg by mouth as needed. ) 60 tablet 0  . metoprolol (LOPRESSOR) 100 MG tablet Take 100 mg by mouth 2 (two) times daily.     . Probiotic Product (PROBIOTIC DAILY PO) Take by mouth daily as needed.    . simvastatin (ZOCOR) 20 MG tablet Take 20 mg by mouth every evening.     No current facility-administered medications on file prior to visit.        Objective:   Physical Exam Blood pressure 126/62, pulse 72, temperature 98.1 F (36.7 C), height 5' 5.5" (1.664 m), weight 186 lb 3.2 oz (84.46 kg).  Alert and oriented. Skin warm and dry. Oral mucosa is moist.   . Sclera anicteric, conjunctivae is pink. Thyroid not enlarged. No cervical lymphadenopathy. Lungs clear. Heart regular rate and rhythm.  Abdomen is soft. Bowel sounds are positive. No hepatomegaly. No abdominal masses felt. No tenderness.  No edema to lower extremities.  No stool and guaiac negative.  Developer: [id number] ( 11/2017) Card: Lot [id number] (12/18)     Assessment & Plan:  Heme postiive stool. Am going to send 3 stools cards home with her for guaiac positive stool at Dr Johnnye Sima office.  Am going to get her colonoscopy report. Further recommendations to follow. Am going to do a celiac panel. Stool diary.  OV in 4 weeks. I have asked Butch Penny to locate her last colonoscopy report

## 2014-07-25 NOTE — Patient Instructions (Addendum)
OV in 4 weeks.  Stool diary

## 2014-07-26 ENCOUNTER — Encounter (INDEPENDENT_AMBULATORY_CARE_PROVIDER_SITE_OTHER): Payer: Self-pay | Admitting: Internal Medicine

## 2014-07-26 DIAGNOSIS — M25562 Pain in left knee: Secondary | ICD-10-CM | POA: Diagnosis not present

## 2014-07-26 DIAGNOSIS — M6281 Muscle weakness (generalized): Secondary | ICD-10-CM | POA: Diagnosis not present

## 2014-07-26 DIAGNOSIS — R269 Unspecified abnormalities of gait and mobility: Secondary | ICD-10-CM | POA: Diagnosis not present

## 2014-07-26 LAB — GLIADIN ANTIBODIES, SERUM
Gliadin IgA: 4 Units (ref <20)
Gliadin IgG: 2 Units (ref <20)

## 2014-07-26 LAB — TISSUE TRANSGLUTAMINASE, IGA: Tissue Transglutaminase Ab, IgA: 1 U/mL (ref <4)

## 2014-07-28 ENCOUNTER — Encounter (INDEPENDENT_AMBULATORY_CARE_PROVIDER_SITE_OTHER): Payer: Self-pay

## 2014-07-28 ENCOUNTER — Telehealth (INDEPENDENT_AMBULATORY_CARE_PROVIDER_SITE_OTHER): Payer: Self-pay | Admitting: Internal Medicine

## 2014-07-28 ENCOUNTER — Other Ambulatory Visit (INDEPENDENT_AMBULATORY_CARE_PROVIDER_SITE_OTHER): Payer: Self-pay | Admitting: *Deleted

## 2014-07-28 ENCOUNTER — Telehealth (INDEPENDENT_AMBULATORY_CARE_PROVIDER_SITE_OTHER): Payer: Self-pay | Admitting: *Deleted

## 2014-07-28 DIAGNOSIS — K625 Hemorrhage of anus and rectum: Secondary | ICD-10-CM

## 2014-07-28 DIAGNOSIS — R195 Other fecal abnormalities: Secondary | ICD-10-CM

## 2014-07-28 DIAGNOSIS — Z86010 Personal history of colonic polyps: Secondary | ICD-10-CM

## 2014-07-28 LAB — RETICULIN ANTIBODIES, IGA W TITER: Reticulin Ab, IgA: NEGATIVE

## 2014-07-28 MED ORDER — PEG 3350-KCL-NA BICARB-NACL 420 G PO SOLR: 4000.0000 mL | Freq: Once | ORAL | Status: DC

## 2014-07-28 NOTE — Telephone Encounter (Signed)
[name], please schedule a flex sigmoid

## 2014-07-28 NOTE — Telephone Encounter (Signed)
Patient needs trilyte 

## 2014-07-28 NOTE — Telephone Encounter (Signed)
[name], She needs a colonoscopy.  Please schedule.

## 2014-07-28 NOTE — Telephone Encounter (Signed)
Needs a colonoscopy. I have spoke with patient.

## 2014-07-28 NOTE — Telephone Encounter (Signed)
TCS sch'd 08/23/14, patient aware

## 2014-07-31 DIAGNOSIS — M25562 Pain in left knee: Secondary | ICD-10-CM | POA: Diagnosis not present

## 2014-07-31 DIAGNOSIS — M6281 Muscle weakness (generalized): Secondary | ICD-10-CM | POA: Diagnosis not present

## 2014-07-31 DIAGNOSIS — R269 Unspecified abnormalities of gait and mobility: Secondary | ICD-10-CM | POA: Diagnosis not present

## 2014-08-01 ENCOUNTER — Telehealth (INDEPENDENT_AMBULATORY_CARE_PROVIDER_SITE_OTHER): Payer: Self-pay | Admitting: *Deleted

## 2014-08-01 NOTE — Telephone Encounter (Signed)
   Diagnosis:    Result(s)   Card 1: Negative:     Card 2: Negative:   Card 3: Negative:    Completed by: Esaiah Wanless,LPN   HEMOCCULT SENSA DEVELOPER: LOT#:  [id number] EXPIRATION DATE: 9-17   HEMOCCULT SENSA CARD:  LOT#:  [id number] EXPIRATION DATE: 07-18   CARD CONTROL RESULTS:  POSITIVE: Positive NEGATIVE: Negative    ADDITIONAL COMMENTS: Forwarded to Lelon Perla for review. Note patient has not been called.

## 2014-08-02 DIAGNOSIS — M6281 Muscle weakness (generalized): Secondary | ICD-10-CM | POA: Diagnosis not present

## 2014-08-02 DIAGNOSIS — R269 Unspecified abnormalities of gait and mobility: Secondary | ICD-10-CM | POA: Diagnosis not present

## 2014-08-02 DIAGNOSIS — M25562 Pain in left knee: Secondary | ICD-10-CM | POA: Diagnosis not present

## 2014-08-09 DIAGNOSIS — R269 Unspecified abnormalities of gait and mobility: Secondary | ICD-10-CM | POA: Diagnosis not present

## 2014-08-09 DIAGNOSIS — M25562 Pain in left knee: Secondary | ICD-10-CM | POA: Diagnosis not present

## 2014-08-09 DIAGNOSIS — M6281 Muscle weakness (generalized): Secondary | ICD-10-CM | POA: Diagnosis not present

## 2014-08-11 DIAGNOSIS — M6281 Muscle weakness (generalized): Secondary | ICD-10-CM | POA: Diagnosis not present

## 2014-08-11 DIAGNOSIS — M25562 Pain in left knee: Secondary | ICD-10-CM | POA: Diagnosis not present

## 2014-08-11 DIAGNOSIS — R269 Unspecified abnormalities of gait and mobility: Secondary | ICD-10-CM | POA: Diagnosis not present

## 2014-08-14 ENCOUNTER — Ambulatory Visit (INDEPENDENT_AMBULATORY_CARE_PROVIDER_SITE_OTHER): Payer: Medicare Other | Admitting: Diagnostic Neuroimaging

## 2014-08-14 ENCOUNTER — Encounter: Payer: Self-pay | Admitting: Diagnostic Neuroimaging

## 2014-08-14 VITALS — BP 114/69 | HR 58 | Ht 65.5 in | Wt 189.0 lb

## 2014-08-14 DIAGNOSIS — R269 Unspecified abnormalities of gait and mobility: Secondary | ICD-10-CM | POA: Diagnosis not present

## 2014-08-14 DIAGNOSIS — R42 Dizziness and giddiness: Secondary | ICD-10-CM | POA: Diagnosis not present
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
## 2014-08-14 NOTE — Patient Instructions (Signed)
Follow up with PCP

## 2014-08-15 ENCOUNTER — Ambulatory Visit (INDEPENDENT_AMBULATORY_CARE_PROVIDER_SITE_OTHER): Payer: Medicare Other | Admitting: Internal Medicine

## 2014-08-15 ENCOUNTER — Encounter (INDEPENDENT_AMBULATORY_CARE_PROVIDER_SITE_OTHER): Payer: Self-pay | Admitting: Internal Medicine

## 2014-08-15 VITALS — BP 132/50 | HR 65 | Temp 97.5°F | Ht 65.5 in | Wt 189.0 lb

## 2014-08-15 DIAGNOSIS — R269 Unspecified abnormalities of gait and mobility: Secondary | ICD-10-CM | POA: Diagnosis not present

## 2014-08-15 DIAGNOSIS — R195 Other fecal abnormalities: Secondary | ICD-10-CM

## 2014-08-15 DIAGNOSIS — M25562 Pain in left knee: Secondary | ICD-10-CM | POA: Diagnosis not present

## 2014-08-15 DIAGNOSIS — M6281 Muscle weakness (generalized): Secondary | ICD-10-CM | POA: Diagnosis not present

## 2014-08-15 NOTE — Progress Notes (Signed)
Subjective:    Patient ID: [name], female    DOB: April 11, 1950, 64 y.o.   MRN: [id number]  HPI Here today for f/u. She was last seen in July for guaiac positive stool by Dr. Glo Herring. Celiac panel was negative. She is scheduled for a colonoscopy.  She is here she does not want a colonoscopy. She has had a few more episodes of some bleeding. Last episode was July 17.  She had been Cirucumin and the Heptocleanse x 1 day. After taking the medication she became sick. She says she passed a fist full of tissue and blood from her rectum. This occurred in June. She says she feels good. Appetite is good. She has gained 3 pounds since her last visit. Usually has a 3-8 BMs a day. On her last visit she was having one stool a day and she says she leaned toward constipation. She says she has changed diet and eating healthier. Her stools are sometimes loose.   . Colonoscopy 02/25/2013 Dr. Britta Mccreedy, [facility] [place] Colonoscopy with snare polypectomy: Two polyps were found in the sigmoid colon. Removed. Retroflexed views in the rectum revealed no abnormalities. Biopsy: Sigmoid biopsy: tubular adenoma. Colon biopsy Sigmoid: Benign colonic epithelium. Quality of prep was excellent. Biopsy tubular adenoma.      Review of Systems Past Medical History  Diagnosis Date  . Hypertension   . Cystitides, interstitial, chronic   . Fibromyalgia   . Urinary tract infection   . Urinary disorder     bladder is in sling per pt  . High cholesterol   . Back pain   . Anxiety   . Depression   . Shortness of breath   . GERD (gastroesophageal reflux disease)   . Vertigo   . Sleep apnea     Past Surgical History  Procedure Laterality Date  . Abdominal hysterectomy  1997    [place] , [place]    . Toe surgery    . Bladder tack    . Laparoscopic salpingo oopherectomy  02/17/2012    Procedure: LAPAROSCOPIC SALPINGO OOPHORECTOMY;  Surgeon: Jonnie Kind, MD;  Location: [facility];  Service: Gynecology;   Laterality: Bilateral;  . Knee surgery Left 05/26/14    meniscus tear    Allergies  Allergen Reactions  . Ciprofloxacin Diarrhea    Intolerance. Patient stated that a lot of antiobiotics give her diarrhea.    Current Outpatient Prescriptions on File Prior to Visit  Medication Sig Dispense Refill  . ALPRAZolam (XANAX) 0.5 MG tablet Take 1 tablet (0.5 mg total) by mouth 2 (two) times daily as needed for anxiety. 60 tablet 2  . amLODipine (NORVASC) 10 MG tablet Take 10 mg by mouth daily.    . cetirizine (ZYRTEC) 10 MG tablet Take 10 mg by mouth daily as needed. Allergies    . cloNIDine (CATAPRES) 0.1 MG tablet Take 0.1 mg by mouth daily.     . DULoxetine (CYMBALTA) 60 MG capsule Take 1 capsule (60 mg total) by mouth daily. 30 capsule 2  . estradiol (ESTRACE) 2 MG tablet TAKE ONE TABLET BY MOUTH ONCE DAILY. 90 tablet 0  . fluticasone (FLONASE) 50 MCG/ACT nasal spray Place 1 spray into both nostrils daily.     Marland Kitchen ibuprofen (ADVIL,MOTRIN) 200 MG tablet Take 800 mg by mouth every 6 (six) hours as needed. Pain    . lisinopril-hydrochlorothiazide (PRINZIDE,ZESTORETIC) 20-12.5 MG per tablet Take 1 tablet by mouth 2 (two) times daily.    . Methenamine-Sodium Salicylate (  CYSTEX) 162-162.5 MG TABS Take 2 tablets by mouth daily as needed. For urinary pain    . methylphenidate (RITALIN) 20 MG tablet Take 1 tablet (20 mg total) by mouth 2 (two) times daily with breakfast and lunch. (Patient taking differently: Take 20 mg by mouth as needed. ) 60 tablet 0  . metoprolol (LOPRESSOR) 100 MG tablet Take 100 mg by mouth 2 (two) times daily.     . polyethylene glycol-electrolytes (NULYTELY/GOLYTELY) 420 G solution Take 4,000 mLs by mouth once. 4000 mL 0  . Probiotic Product (PROBIOTIC DAILY PO) Take by mouth daily as needed.    . simvastatin (ZOCOR) 20 MG tablet Take 20 mg by mouth every evening.    Marland Kitchen oxyCODONE-acetaminophen (PERCOCET/ROXICET) 5-325 MG per tablet Take by mouth every 4 (four) hours as needed for  severe pain.     No current facility-administered medications on file prior to visit.        Objective:   Physical Exam Blood pressure 132/50, pulse 65, temperature 97.5 F (36.4 C), height 5' 5.5" (1.664 m), weight 189 lb (85.73 kg).  Alert and oriented. Skin warm and dry. Oral mucosa is moist.   . Sclera anicteric, conjunctivae is pink. Thyroid not enlarged. No cervical lymphadenopathy. Lungs clear. Heart regular rate and rhythm.  Abdomen is soft. Bowel sounds are positive. No hepatomegaly. No abdominal masses felt. No tenderness.  No edema to lower extremities.        Assessment & Plan:  Guaiac + stool.  Change in stool. She is scheduled for a colonoscopy.

## 2014-08-15 NOTE — Patient Instructions (Signed)
Scheduled for a colonoscopy this month

## 2014-08-16 ENCOUNTER — Encounter (INDEPENDENT_AMBULATORY_CARE_PROVIDER_SITE_OTHER): Payer: Self-pay

## 2014-08-16 DIAGNOSIS — R269 Unspecified abnormalities of gait and mobility: Secondary | ICD-10-CM | POA: Diagnosis not present

## 2014-08-16 DIAGNOSIS — M25562 Pain in left knee: Secondary | ICD-10-CM | POA: Diagnosis not present

## 2014-08-16 DIAGNOSIS — M6281 Muscle weakness (generalized): Secondary | ICD-10-CM | POA: Diagnosis not present

## 2014-08-23 ENCOUNTER — Ambulatory Visit (HOSPITAL_COMMUNITY)
Admission: RE | Admit: 2014-08-23 | Discharge: 2014-08-23 | Disposition: A | Discharge status: Home / Self Care | Payer: Medicare Other | Source: Ambulatory Visit | Attending: Internal Medicine | Admitting: Internal Medicine

## 2014-08-23 ENCOUNTER — Encounter (HOSPITAL_COMMUNITY)
Admission: RE | Disposition: A | Discharge status: Home / Self Care | Payer: Self-pay | Source: Ambulatory Visit | Attending: Internal Medicine

## 2014-08-23 ENCOUNTER — Encounter (HOSPITAL_COMMUNITY): Payer: Self-pay

## 2014-08-23 DIAGNOSIS — M797 Fibromyalgia: Secondary | ICD-10-CM | POA: Insufficient documentation

## 2014-08-23 DIAGNOSIS — Z881 Allergy status to other antibiotic agents status: Secondary | ICD-10-CM | POA: Diagnosis not present

## 2014-08-23 DIAGNOSIS — K644 Residual hemorrhoidal skin tags: Secondary | ICD-10-CM | POA: Insufficient documentation

## 2014-08-23 DIAGNOSIS — F419 Anxiety disorder, unspecified: Secondary | ICD-10-CM | POA: Insufficient documentation

## 2014-08-23 DIAGNOSIS — K648 Other hemorrhoids: Secondary | ICD-10-CM | POA: Diagnosis not present

## 2014-08-23 DIAGNOSIS — E780 Pure hypercholesterolemia: Secondary | ICD-10-CM | POA: Insufficient documentation

## 2014-08-23 DIAGNOSIS — R195 Other fecal abnormalities: Secondary | ICD-10-CM | POA: Diagnosis not present

## 2014-08-23 DIAGNOSIS — N399 Disorder of urinary system, unspecified: Secondary | ICD-10-CM | POA: Diagnosis not present

## 2014-08-23 DIAGNOSIS — N3010 Interstitial cystitis (chronic) without hematuria: Secondary | ICD-10-CM | POA: Insufficient documentation

## 2014-08-23 DIAGNOSIS — G4730 Sleep apnea, unspecified: Secondary | ICD-10-CM | POA: Insufficient documentation

## 2014-08-23 DIAGNOSIS — Z90710 Acquired absence of both cervix and uterus: Secondary | ICD-10-CM | POA: Diagnosis not present

## 2014-08-23 DIAGNOSIS — R194 Change in bowel habit: Secondary | ICD-10-CM | POA: Diagnosis not present

## 2014-08-23 DIAGNOSIS — K625 Hemorrhage of anus and rectum: Secondary | ICD-10-CM

## 2014-08-23 DIAGNOSIS — R269 Unspecified abnormalities of gait and mobility: Secondary | ICD-10-CM | POA: Diagnosis not present

## 2014-08-23 DIAGNOSIS — F329 Major depressive disorder, single episode, unspecified: Secondary | ICD-10-CM | POA: Insufficient documentation

## 2014-08-23 DIAGNOSIS — K633 Ulcer of intestine: Secondary | ICD-10-CM | POA: Diagnosis not present

## 2014-08-23 DIAGNOSIS — R197 Diarrhea, unspecified: Secondary | ICD-10-CM | POA: Insufficient documentation

## 2014-08-23 DIAGNOSIS — I10 Essential (primary) hypertension: Secondary | ICD-10-CM | POA: Insufficient documentation

## 2014-08-23 DIAGNOSIS — Z86010 Personal history of colonic polyps: Secondary | ICD-10-CM | POA: Diagnosis not present

## 2014-08-23 DIAGNOSIS — K219 Gastro-esophageal reflux disease without esophagitis: Secondary | ICD-10-CM | POA: Diagnosis not present

## 2014-08-23 DIAGNOSIS — Z87440 Personal history of urinary (tract) infections: Secondary | ICD-10-CM | POA: Insufficient documentation

## 2014-08-23 DIAGNOSIS — K6289 Other specified diseases of anus and rectum: Secondary | ICD-10-CM | POA: Diagnosis not present

## 2014-08-23 HISTORY — PX: COLONOSCOPY: SHX5424

## 2014-08-23 SURGERY — COLONOSCOPY
Anesthesia: Moderate Sedation

## 2014-08-23 MED ORDER — MIDAZOLAM HCL 5 MG/5ML IJ SOLN
INTRAMUSCULAR | Status: AC
  Filled 2014-08-23: qty 5

## 2014-08-23 MED ORDER — MIDAZOLAM HCL 5 MG/5ML IJ SOLN
INTRAMUSCULAR | Status: AC
  Filled 2014-08-23: qty 10

## 2014-08-23 MED ORDER — MEPERIDINE HCL 50 MG/ML IJ SOLN
INTRAMUSCULAR | Status: AC
  Filled 2014-08-23: qty 1

## 2014-08-23 MED ORDER — BENEFIBER DRINK MIX PO PACK: 4.0000 g | PACK | Freq: Every day | ORAL | Status: DC

## 2014-08-23 MED ORDER — MEPERIDINE HCL 50 MG/ML IJ SOLN
INTRAMUSCULAR | Status: DC | PRN
  Administered 2014-08-23 (×4): 25 mg via INTRAVENOUS

## 2014-08-23 MED ORDER — MIDAZOLAM HCL 5 MG/5ML IJ SOLN
INTRAMUSCULAR | Status: DC | PRN
  Administered 2014-08-23 (×3): 3 mg via INTRAVENOUS
  Administered 2014-08-23 (×4): 2 mg via INTRAVENOUS

## 2014-08-23 MED ORDER — STERILE WATER FOR IRRIGATION IR SOLN
Status: DC | PRN
  Administered 2014-08-23: 11:00:00

## 2014-08-23 MED ORDER — DOCUSATE SODIUM 100 MG PO CAPS: 200.0000 mg | ORAL_CAPSULE | Freq: Every day | ORAL | Status: DC

## 2014-08-23 MED ORDER — SODIUM CHLORIDE 0.9 % IV SOLN
INTRAVENOUS | Status: DC
  Administered 2014-08-23: 11:00:00 via INTRAVENOUS

## 2014-08-23 NOTE — H&P (Signed)
[name] is an 64 y.o. female.   Chief Complaint: Patient is here for colonoscopy. HPI: Patient is 64 year old Caucasian female who presents with diarrhea urgency and intermittent rectal bleeding. She also been positive stools. She is having as many as 7 stools and some days. Most of her stools are loose. She was screened for celiac disease and blood test was negative. Last colonoscopy was in February 2015 at [facility] needed [place] with removal of 2 polyps by Dr. Cyndia Diver in one polyp was tubular adenoma. Patient states she another colonoscopy 10 years ago for bleeding does not recall anything was found. Family history is negative for CRC and. IBD  Past Medical History  Diagnosis Date  . Hypertension   . Cystitides, interstitial, chronic   . Fibromyalgia   . Urinary tract infection   . Urinary disorder     bladder is in sling per pt  . High cholesterol   . Back pain   . Anxiety   . Depression   . Shortness of breath   . GERD (gastroesophageal reflux disease)   . Vertigo   . Sleep apnea     Past Surgical History  Procedure Laterality Date  . Abdominal hysterectomy  1997    [place] , [place]    . Toe surgery    . Bladder tack    . Laparoscopic salpingo oopherectomy  02/17/2012    Procedure: LAPAROSCOPIC SALPINGO OOPHORECTOMY;  Surgeon: Jonnie Kind, MD;  Location: [facility];  Service: Gynecology;  Laterality: Bilateral;  . Knee surgery Left 05/26/14    meniscus tear    Family History  Problem Relation Age of Onset  . Atrial fibrillation Mother   . COPD Mother   . Hypertension Mother   . Hypertension Father   . Cancer Father     throat   . Drug abuse Brother   . Alcohol abuse Brother   . Early death Brother   . Fibromyalgia Daughter   . Paranoid behavior Son   . Alcohol abuse Son   . Hypertension Brother   . Sleep apnea Brother   . Alcohol abuse Brother   . Drug abuse Brother    Social History:  reports that she has never smoked. She  has never used smokeless tobacco. She reports that she does not drink alcohol or use illicit drugs.  Allergies:  Allergies  Allergen Reactions  . Ciprofloxacin Diarrhea    Intolerance. Patient stated that a lot of antiobiotics give her diarrhea.    Medications Prior to Admission  Medication Sig Dispense Refill  . ALPRAZolam (XANAX) 0.5 MG tablet Take 1 tablet (0.5 mg total) by mouth 2 (two) times daily as needed for anxiety. 60 tablet 2  . amLODipine (NORVASC) 10 MG tablet Take 10 mg by mouth daily.    . cetirizine (ZYRTEC) 10 MG tablet Take 10 mg by mouth daily as needed. Allergies    . cloNIDine (CATAPRES) 0.1 MG tablet Take 0.1 mg by mouth daily.     . DimenhyDRINATE (DRAMAMINE PO) Take 1 tablet by mouth daily.    . DULoxetine (CYMBALTA) 60 MG capsule Take 1 capsule (60 mg total) by mouth daily. 30 capsule 2  . estradiol (ESTRACE) 2 MG tablet TAKE ONE TABLET BY MOUTH ONCE DAILY. 90 tablet 0  . fluticasone (FLONASE) 50 MCG/ACT nasal spray Place 1 spray into both nostrils daily.     Marland Kitchen ibuprofen (ADVIL,MOTRIN) 200 MG tablet Take 800 mg by mouth every 6 (six)  hours as needed. Pain    . lisinopril-hydrochlorothiazide (PRINZIDE,ZESTORETIC) 20-12.5 MG per tablet Take 1 tablet by mouth 2 (two) times daily.    . Methenamine-Sodium Salicylate (CYSTEX) 501-586.8 MG TABS Take 2 tablets by mouth daily as needed. For urinary pain    . methylphenidate (RITALIN) 20 MG tablet Take 1 tablet (20 mg total) by mouth 2 (two) times daily with breakfast and lunch. (Patient taking differently: Take 20 mg by mouth as needed. ) 60 tablet 0  . metoprolol (LOPRESSOR) 100 MG tablet Take 100 mg by mouth 2 (two) times daily.     . polyethylene glycol-electrolytes (NULYTELY/GOLYTELY) 420 G solution Take 4,000 mLs by mouth once. 4000 mL 0  . Probiotic Product (PROBIOTIC DAILY PO) Take by mouth daily as needed.    . simvastatin (ZOCOR) 20 MG tablet Take 20 mg by mouth every evening.      No results found for this or  any previous visit (from the past 48 hour(s)). No results found.  ROS  Blood pressure 122/73, temperature 98 F (36.7 C), resp. rate 16, SpO2 97 %. Physical Exam  Constitutional: She appears well-developed and well-nourished.  HENT:  Mouth/Throat: Oropharynx is clear and moist.  Eyes: Conjunctivae are normal. No scleral icterus.  Neck: No thyromegaly present.  Cardiovascular: Normal rate, regular rhythm and normal heart sounds.   No murmur heard. Respiratory: Effort normal and breath sounds normal.  GI: Soft.  Abdomen is symmetrical with small subcutaneous lipoma at epigastrium. No organomegaly or masses noted.  Musculoskeletal: She exhibits no edema.  Lymphadenopathy:    She has no cervical adenopathy.  Neurological: She is alert.  Skin: Skin is warm and dry.     Assessment/Plan Diarrhea and rectal bleeding. Heme positive stool and history of colonic adenoma. Last colonoscopy was in February 2015. Diagnostic colonoscopy.  Josh Nicolosi U 08/23/2014, 11:19 AM

## 2014-08-23 NOTE — Discharge Instructions (Signed)

## 2014-08-23 NOTE — Op Note (Signed)
COLONOSCOPY PROCEDURE REPORT  PATIENT:  [name]  MR#:  [id number] Birthdate:  January 29, 1950, 64 y.o., female Endoscopist:  Dr. Rogene Houston, MD Referred By:  Dr. Mallory Shirk, MD  Procedure Date: 08/23/2014  Procedure:   Colonoscopy  Indications:  Patient is 64 year old Caucasian female who presents with diarrhea diarrhea 6-7 stools per day as well as nocturnal bowel movements she also has intermittent rectal bleeding and has heme positive stool. Last colonoscopy was in every 2015 with removal of 2 polyps and one was tubular adenoma.  Informed Consent:  The procedure and risks were reviewed with the patient and informed consent was obtained.  Medications:  Demerol 100 mg IV Versed 17 mg IV  Description of procedure:  After a digital rectal exam was performed, that colonoscope was advanced from the anus through the rectum and colon to the area of the cecum, ileocecal valve and appendiceal orifice. The cecum was deeply intubated. These structures were well-seen and photographed for the record. From the level of the cecum and ileocecal valve, the scope was slowly and cautiously withdrawn. The mucosal surfaces were carefully surveyed utilizing scope tip to flexion to facilitate fold flattening as needed. The scope was pulled down into the rectum where a thorough exam including retroflexion was performed. Terminal ileum was also examined.  Findings:   Prep excellent. Single erosion noted involving mucosa of terminal ileum.  Mucosa at cecum, ascending colon, hepatic flexure, transverse colon, splenic flexure, descending and sigmoid colon was normal. Normal rectal mucosa. Two anal papillae and small hemorrhoids noted below the dentate line.   Therapeutic/Diagnostic Maneuvers Performed:   I'll see taken from mucosa of terminal ileum and also from mucosa of sigmoid colon.  Complications:  None  Cecal Withdrawal Time:  12 minutes  Impression:  Single erosion terminal ileum most  likely secondary to NSAID use. Normal colonoscopy except external hemorrhoids and anal papillae. Random biopsies taken from mucosa of sigmoid colon looking for microscopic or collagenous colitis.   Recommendations:  Standard instructions given. Benefiber 4 g by mouth daily at bedtime. Colace 200 mg by mouth daily at bedtime. I will contact patient with biopsy results and further recommendations.  [name] U  08/23/2014 12:09 PM  CC: Dr. Rory Percy, MD & Dr. Rayne Du ref. provider found CC: Dr. Mallory Shirk, MD

## 2014-08-25 ENCOUNTER — Encounter (INDEPENDENT_AMBULATORY_CARE_PROVIDER_SITE_OTHER): Payer: Self-pay | Admitting: Internal Medicine

## 2014-08-25 MED ORDER — ONDANSETRON HCL 4 MG PO TABS: 4.0000 mg | ORAL_TABLET | Freq: Two times a day (BID) | ORAL | Status: DC | PRN

## 2014-08-28 ENCOUNTER — Encounter (HOSPITAL_COMMUNITY): Payer: Self-pay | Admitting: Internal Medicine

## 2014-09-06 DIAGNOSIS — S83242A Other tear of medial meniscus, current injury, left knee, initial encounter: Secondary | ICD-10-CM | POA: Diagnosis not present

## 2014-09-06 DIAGNOSIS — M222X2 Patellofemoral disorders, left knee: Secondary | ICD-10-CM | POA: Diagnosis not present

## 2014-09-06 DIAGNOSIS — M1712 Unilateral primary osteoarthritis, left knee: Secondary | ICD-10-CM | POA: Diagnosis not present

## 2014-09-06 DIAGNOSIS — M545 Low back pain: Secondary | ICD-10-CM | POA: Diagnosis not present

## 2014-09-13 DIAGNOSIS — M545 Low back pain: Secondary | ICD-10-CM | POA: Diagnosis not present

## 2014-09-13 DIAGNOSIS — F328 Other depressive episodes: Secondary | ICD-10-CM | POA: Diagnosis not present

## 2014-09-13 DIAGNOSIS — I10 Essential (primary) hypertension: Secondary | ICD-10-CM | POA: Diagnosis not present

## 2014-09-27 ENCOUNTER — Ambulatory Visit (INDEPENDENT_AMBULATORY_CARE_PROVIDER_SITE_OTHER): Payer: Medicare Other | Admitting: Psychiatry

## 2014-09-27 ENCOUNTER — Encounter (HOSPITAL_COMMUNITY): Payer: Self-pay | Admitting: Psychiatry

## 2014-09-27 VITALS — BP 115/54 | HR 66 | Ht 65.0 in | Wt 188.8 lb

## 2014-09-27 DIAGNOSIS — F331 Major depressive disorder, recurrent, moderate: Secondary | ICD-10-CM

## 2014-09-27 DIAGNOSIS — F329 Major depressive disorder, single episode, unspecified: Secondary | ICD-10-CM

## 2014-09-27 MED ORDER — ALPRAZOLAM 1 MG PO TABS: 1.0000 mg | ORAL_TABLET | Freq: Two times a day (BID) | ORAL | Status: DC

## 2014-09-27 MED ORDER — METHYLPHENIDATE HCL 20 MG PO TABS: 20.0000 mg | ORAL_TABLET | Freq: Two times a day (BID) | ORAL | Status: DC

## 2014-09-27 MED ORDER — DULOXETINE HCL 60 MG PO CPEP: 60.0000 mg | ORAL_CAPSULE | Freq: Every day | ORAL | Status: DC
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
## 2014-10-09 ENCOUNTER — Ambulatory Visit: Payer: Medicare Other | Admitting: Obstetrics and Gynecology

## 2014-10-10 NOTE — Telephone Encounter (Signed)
This encounter was created in error - please disregard.

## 2014-10-13 ENCOUNTER — Other Ambulatory Visit: Payer: Self-pay | Admitting: Obstetrics and Gynecology

## 2014-10-13 DIAGNOSIS — Z1231 Encounter for screening mammogram for malignant neoplasm of breast: Secondary | ICD-10-CM

## 2014-10-17 DIAGNOSIS — H04123 Dry eye syndrome of bilateral lacrimal glands: Secondary | ICD-10-CM | POA: Diagnosis not present

## 2014-10-17 DIAGNOSIS — H2513 Age-related nuclear cataract, bilateral: Secondary | ICD-10-CM | POA: Diagnosis not present

## 2014-10-30 ENCOUNTER — Ambulatory Visit (INDEPENDENT_AMBULATORY_CARE_PROVIDER_SITE_OTHER): Payer: Self-pay | Admitting: Internal Medicine

## 2014-10-31 ENCOUNTER — Encounter (INDEPENDENT_AMBULATORY_CARE_PROVIDER_SITE_OTHER): Payer: Self-pay | Admitting: Internal Medicine

## 2014-10-31 ENCOUNTER — Ambulatory Visit (INDEPENDENT_AMBULATORY_CARE_PROVIDER_SITE_OTHER): Payer: Medicare Other | Admitting: Internal Medicine

## 2014-10-31 VITALS — BP 110/68 | HR 72 | Temp 97.7°F | Resp 16 | Ht 65.0 in | Wt 185.0 lb

## 2014-10-31 DIAGNOSIS — K589 Irritable bowel syndrome without diarrhea: Secondary | ICD-10-CM

## 2014-10-31 MED ORDER — DOCUSATE SODIUM 100 MG PO CAPS: 100.0000 mg | ORAL_CAPSULE | Freq: Every day | ORAL | Status: DC

## 2014-10-31 NOTE — Patient Instructions (Signed)
Can use Dulcolax suppository on as-needed basis for constipation. Keep stool diary as to frequency and consistency of stools for 1 month prior to next visit.

## 2014-10-31 NOTE — Progress Notes (Signed)
Presenting complaint;  Follow-up for diarrhea.  Subjective:  [name] is 64 year old Caucasian female who was recently evaluated for diarrhea and hematochezia. She underwent colonoscopy on 08/23/2014. She had single erosion and TR felt to be due to NSAID use. Colonoscopy was normal other than hemorrhoids and anal papillae. Random biopsies from sigmoid colon were negative for microscopic or collagenous colitis. Patient was advised to take Benefiber 4 g daily and Colace 20 mg by mouth daily at bedtime. She was asked to keep stool diary. Patient states she had one episode of rectal bleeding last week with her bowel movement. Urgency has decreased and she has not had any accident. Stool diary was reviewed for her. She is having anywhere from 1-5 stools per day. On most days she had 2-3 stools. Most of her stools are formed. She states she is not taking fiber supplement on regular basis. She is afraid that she might get constipated. Her appetite is good and her weight has been stable. She has multiple other complaints which include extreme fatigue as well as numbness in her hands and feet. She was seen by neurologist not satisfied with evaluation and recommendations and wants to have a second opinion which she will discuss with PCP.   Current Medications: Outpatient Encounter Prescriptions as of 10/31/2014  Medication Sig  . acetaminophen (TYLENOL) 500 MG tablet Take 500 mg by mouth 2 (two) times daily.  Marland Kitchen ALPRAZolam (XANAX) 1 MG tablet Take 1 tablet (1 mg total) by mouth 2 (two) times daily.  Marland Kitchen amLODipine (NORVASC) 10 MG tablet Take 10 mg by mouth 2 (two) times daily.   . cetirizine (ZYRTEC) 10 MG tablet Take 10 mg by mouth daily as needed. Allergies  . DimenhyDRINATE (DRAMAMINE PO) Take 1 tablet by mouth daily.  Marland Kitchen docusate sodium (COLACE) 100 MG capsule Take 2 capsules (200 mg total) by mouth at bedtime.  . DULoxetine (CYMBALTA) 60 MG capsule Take 1 capsule (60 mg total) by mouth daily.  .  fluticasone (FLONASE) 50 MCG/ACT nasal spray Place 1 spray into both nostrils daily.   Marland Kitchen lisinopril-hydrochlorothiazide (PRINZIDE,ZESTORETIC) 20-12.5 MG per tablet Take 1 tablet by mouth 2 (two) times daily.  . Methenamine-Sodium Salicylate (CYSTEX) 237-628.3 MG TABS Take 2 tablets by mouth daily as needed. For urinary pain  . methylphenidate (RITALIN) 20 MG tablet Take 1 tablet (20 mg total) by mouth 2 (two) times daily with breakfast and lunch.  . metoprolol (LOPRESSOR) 100 MG tablet Take 100 mg by mouth 2 (two) times daily.   . ondansetron (ZOFRAN) 4 MG tablet Take 1 tablet (4 mg total) by mouth 2 (two) times daily as needed for nausea or vomiting. (Patient taking differently: Take 4 mg by mouth as needed for nausea or vomiting. )  . Probiotic Product (PROBIOTIC DAILY PO) Take by mouth daily. Patient states that she takes 1-2 daily.  . simvastatin (ZOCOR) 20 MG tablet Take 20 mg by mouth every evening.  . Wheat Dextrin (BENEFIBER DRINK MIX) PACK Take 4 g by mouth at bedtime.  . [DISCONTINUED] methylphenidate (RITALIN) 20 MG tablet Take 1 tablet (20 mg total) by mouth 2 (two) times daily with breakfast and lunch. (Patient not taking: Reported on 10/31/2014)   No facility-administered encounter medications on file as of 10/31/2014.     Objective: Blood pressure 110/68, pulse 72, temperature 97.7 F (36.5 C), temperature source Oral, resp. rate 16, height 5\' 5"  (1.651 m), weight 185 lb (83.915 kg). Patient is alert and in no acute distress. Conjunctiva is pink. Sclera  is nonicteric Oropharyngeal mucosa is normal. No neck masses or thyromegaly noted. Cardiac exam with regular rhythm normal S1 and S2. No murmur or gallop noted. Lungs are clear to auscultation. Abdomen is full. On palpation is soft and nontender without organomegaly or masses.  No LE edema or clubbing noted.  Labs/studies Results: Sigmoid colon biopsy negative for collagenous or microscopic  colitis(08/23/2014).   Assessment:  #1. Irritable bowel syndrome. Her IBS is primarily associated with diarrhea. She is doing better with therapy but still having frequent stools on some days.   Plan:  Patient encouraged to take fiber supplement every day. Colace 100 mg by mouth daily at bedtime. Patient can use Dulcolax suppository should she become constipated. Patient advised not to take OTC laxatives for constipation as it would result in explosive diarrhea and urgency. Patient advised to keep symptom diary for 1 month prior to her next visit in 6 months.

## 2014-11-01 DIAGNOSIS — M5416 Radiculopathy, lumbar region: Secondary | ICD-10-CM | POA: Diagnosis not present

## 2014-11-01 DIAGNOSIS — M1712 Unilateral primary osteoarthritis, left knee: Secondary | ICD-10-CM | POA: Diagnosis not present

## 2014-11-01 DIAGNOSIS — M13162 Monoarthritis, not elsewhere classified, left knee: Secondary | ICD-10-CM | POA: Diagnosis not present

## 2014-11-02 DIAGNOSIS — M25562 Pain in left knee: Secondary | ICD-10-CM | POA: Diagnosis not present

## 2014-11-02 DIAGNOSIS — Z9889 Other specified postprocedural states: Secondary | ICD-10-CM | POA: Diagnosis not present

## 2014-11-02 DIAGNOSIS — M545 Low back pain: Secondary | ICD-10-CM | POA: Diagnosis not present

## 2014-11-07 DIAGNOSIS — Z9889 Other specified postprocedural states: Secondary | ICD-10-CM | POA: Diagnosis not present

## 2014-11-07 DIAGNOSIS — M25562 Pain in left knee: Secondary | ICD-10-CM | POA: Diagnosis not present

## 2014-11-07 DIAGNOSIS — M545 Low back pain: Secondary | ICD-10-CM | POA: Diagnosis not present

## 2014-11-08 DIAGNOSIS — M5126 Other intervertebral disc displacement, lumbar region: Secondary | ICD-10-CM | POA: Diagnosis not present

## 2014-11-08 DIAGNOSIS — M4806 Spinal stenosis, lumbar region: Secondary | ICD-10-CM | POA: Diagnosis not present

## 2014-11-08 DIAGNOSIS — M5136 Other intervertebral disc degeneration, lumbar region: Secondary | ICD-10-CM | POA: Diagnosis not present

## 2014-11-08 DIAGNOSIS — M5416 Radiculopathy, lumbar region: Secondary | ICD-10-CM | POA: Diagnosis not present

## 2014-11-08 DIAGNOSIS — M47816 Spondylosis without myelopathy or radiculopathy, lumbar region: Secondary | ICD-10-CM | POA: Diagnosis not present

## 2014-11-10 DIAGNOSIS — Z9889 Other specified postprocedural states: Secondary | ICD-10-CM | POA: Diagnosis not present

## 2014-11-10 DIAGNOSIS — M25562 Pain in left knee: Secondary | ICD-10-CM | POA: Diagnosis not present

## 2014-11-10 DIAGNOSIS — M545 Low back pain: Secondary | ICD-10-CM | POA: Diagnosis not present

## 2014-11-14 DIAGNOSIS — M25562 Pain in left knee: Secondary | ICD-10-CM | POA: Diagnosis not present

## 2014-11-14 DIAGNOSIS — M1712 Unilateral primary osteoarthritis, left knee: Secondary | ICD-10-CM | POA: Diagnosis not present

## 2014-11-14 DIAGNOSIS — M545 Low back pain: Secondary | ICD-10-CM | POA: Diagnosis not present

## 2014-11-14 DIAGNOSIS — M797 Fibromyalgia: Secondary | ICD-10-CM | POA: Diagnosis not present

## 2014-11-14 DIAGNOSIS — M222X2 Patellofemoral disorders, left knee: Secondary | ICD-10-CM | POA: Diagnosis not present

## 2014-11-16 DIAGNOSIS — M25562 Pain in left knee: Secondary | ICD-10-CM | POA: Diagnosis not present

## 2014-11-16 DIAGNOSIS — M545 Low back pain: Secondary | ICD-10-CM | POA: Diagnosis not present

## 2014-11-21 DIAGNOSIS — M25562 Pain in left knee: Secondary | ICD-10-CM | POA: Diagnosis not present

## 2014-11-21 DIAGNOSIS — M545 Low back pain: Secondary | ICD-10-CM | POA: Diagnosis not present

## 2014-11-23 ENCOUNTER — Ambulatory Visit (INDEPENDENT_AMBULATORY_CARE_PROVIDER_SITE_OTHER): Payer: Medicare Other | Admitting: Psychiatry

## 2014-11-23 ENCOUNTER — Encounter (HOSPITAL_COMMUNITY): Payer: Self-pay | Admitting: Psychiatry

## 2014-11-23 VITALS — BP 133/67 | HR 70 | Ht 65.0 in | Wt 188.8 lb

## 2014-11-23 DIAGNOSIS — F331 Major depressive disorder, recurrent, moderate: Secondary | ICD-10-CM

## 2014-11-23 DIAGNOSIS — F411 Generalized anxiety disorder: Secondary | ICD-10-CM

## 2014-11-23 MED ORDER — DULOXETINE HCL 60 MG PO CPEP: 60.0000 mg | ORAL_CAPSULE | Freq: Every day | ORAL | Status: DC

## 2014-11-23 MED ORDER — ALPRAZOLAM 1 MG PO TABS: 1.0000 mg | ORAL_TABLET | Freq: Two times a day (BID) | ORAL | Status: DC

## 2014-11-23 MED ORDER — METHYLPHENIDATE HCL 20 MG PO TABS: 20.0000 mg | ORAL_TABLET | Freq: Two times a day (BID) | ORAL | Status: DC
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
## 2014-11-24 DIAGNOSIS — M25562 Pain in left knee: Secondary | ICD-10-CM | POA: Diagnosis not present

## 2014-11-24 DIAGNOSIS — M545 Low back pain: Secondary | ICD-10-CM | POA: Diagnosis not present

## 2014-11-27 ENCOUNTER — Ambulatory Visit (HOSPITAL_COMMUNITY)
Admission: RE | Admit: 2014-11-27 | Discharge: 2014-11-27 | Disposition: A | Discharge status: Home / Self Care | Payer: Medicare Other | Source: Ambulatory Visit | Attending: Obstetrics and Gynecology | Admitting: Obstetrics and Gynecology

## 2014-11-27 ENCOUNTER — Ambulatory Visit (HOSPITAL_COMMUNITY): Payer: Self-pay | Admitting: Psychiatry

## 2014-11-27 ENCOUNTER — Other Ambulatory Visit: Payer: Self-pay | Admitting: Obstetrics and Gynecology

## 2014-11-27 DIAGNOSIS — Z1231 Encounter for screening mammogram for malignant neoplasm of breast: Secondary | ICD-10-CM | POA: Diagnosis not present

## 2014-11-27 NOTE — Telephone Encounter (Signed)
NEEDS APPT 

## 2014-11-28 DIAGNOSIS — M25562 Pain in left knee: Secondary | ICD-10-CM | POA: Diagnosis not present

## 2014-11-28 DIAGNOSIS — M545 Low back pain: Secondary | ICD-10-CM | POA: Diagnosis not present

## 2014-11-29 ENCOUNTER — Ambulatory Visit (INDEPENDENT_AMBULATORY_CARE_PROVIDER_SITE_OTHER): Payer: Medicare Other | Admitting: Obstetrics and Gynecology

## 2014-11-29 ENCOUNTER — Encounter: Payer: Self-pay | Admitting: Obstetrics and Gynecology

## 2014-11-29 VITALS — BP 140/90 | Ht 65.5 in | Wt 185.0 lb

## 2014-11-29 DIAGNOSIS — N951 Menopausal and female climacteric states: Secondary | ICD-10-CM

## 2014-11-29 MED ORDER — ESTRADIOL 0.1 MG/24HR TD PTTW: 1.0000 | MEDICATED_PATCH | TRANSDERMAL | Status: DC

## 2014-11-29 NOTE — Progress Notes (Signed)
Patient ID: [name], female   DOB: 1950-12-23, 64 y.o.   MRN: [id number] Pt here today for medication refill. Pt states that she has been having a lot of hot flashes.
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
## 2014-11-30 ENCOUNTER — Telehealth: Payer: Self-pay | Admitting: *Deleted

## 2014-11-30 DIAGNOSIS — M25562 Pain in left knee: Secondary | ICD-10-CM | POA: Diagnosis not present

## 2014-11-30 DIAGNOSIS — M545 Low back pain: Secondary | ICD-10-CM | POA: Diagnosis not present

## 2014-12-01 NOTE — Telephone Encounter (Signed)
Come by Monday for discount card

## 2014-12-22 DIAGNOSIS — M5136 Other intervertebral disc degeneration, lumbar region: Secondary | ICD-10-CM | POA: Diagnosis not present

## 2014-12-22 DIAGNOSIS — M47817 Spondylosis without myelopathy or radiculopathy, lumbosacral region: Secondary | ICD-10-CM | POA: Diagnosis not present

## 2014-12-22 DIAGNOSIS — M797 Fibromyalgia: Secondary | ICD-10-CM | POA: Diagnosis not present

## 2014-12-22 DIAGNOSIS — M9983 Other biomechanical lesions of lumbar region: Secondary | ICD-10-CM | POA: Diagnosis not present

## 2014-12-25 DIAGNOSIS — M545 Low back pain: Secondary | ICD-10-CM | POA: Diagnosis not present

## 2014-12-25 DIAGNOSIS — E782 Mixed hyperlipidemia: Secondary | ICD-10-CM | POA: Diagnosis not present

## 2014-12-25 DIAGNOSIS — R203 Hyperesthesia: Secondary | ICD-10-CM | POA: Diagnosis not present

## 2014-12-25 DIAGNOSIS — I10 Essential (primary) hypertension: Secondary | ICD-10-CM | POA: Diagnosis not present

## 2014-12-25 DIAGNOSIS — F330 Major depressive disorder, recurrent, mild: Secondary | ICD-10-CM | POA: Diagnosis not present

## 2014-12-26 DIAGNOSIS — M47817 Spondylosis without myelopathy or radiculopathy, lumbosacral region: Secondary | ICD-10-CM | POA: Diagnosis not present

## 2014-12-26 DIAGNOSIS — E785 Hyperlipidemia, unspecified: Secondary | ICD-10-CM | POA: Diagnosis not present

## 2014-12-26 DIAGNOSIS — M9983 Other biomechanical lesions of lumbar region: Secondary | ICD-10-CM | POA: Diagnosis not present

## 2014-12-26 DIAGNOSIS — F329 Major depressive disorder, single episode, unspecified: Secondary | ICD-10-CM | POA: Diagnosis not present

## 2014-12-26 DIAGNOSIS — M797 Fibromyalgia: Secondary | ICD-10-CM | POA: Diagnosis not present

## 2014-12-26 DIAGNOSIS — Z79899 Other long term (current) drug therapy: Secondary | ICD-10-CM | POA: Diagnosis not present

## 2014-12-26 DIAGNOSIS — F419 Anxiety disorder, unspecified: Secondary | ICD-10-CM | POA: Diagnosis not present

## 2014-12-26 DIAGNOSIS — M5136 Other intervertebral disc degeneration, lumbar region: Secondary | ICD-10-CM | POA: Diagnosis not present

## 2014-12-26 DIAGNOSIS — I10 Essential (primary) hypertension: Secondary | ICD-10-CM | POA: Diagnosis not present

## 2014-12-26 DIAGNOSIS — H9190 Unspecified hearing loss, unspecified ear: Secondary | ICD-10-CM | POA: Diagnosis not present

## 2015-01-02 DIAGNOSIS — R05 Cough: Secondary | ICD-10-CM | POA: Diagnosis not present

## 2015-01-02 DIAGNOSIS — J209 Acute bronchitis, unspecified: Secondary | ICD-10-CM | POA: Diagnosis not present

## 2015-01-23 DIAGNOSIS — J209 Acute bronchitis, unspecified: Secondary | ICD-10-CM | POA: Insufficient documentation

## 2015-01-23 DIAGNOSIS — R059 Cough, unspecified: Secondary | ICD-10-CM | POA: Insufficient documentation

## 2015-01-24 DIAGNOSIS — R05 Cough: Secondary | ICD-10-CM | POA: Diagnosis not present

## 2015-01-24 DIAGNOSIS — J209 Acute bronchitis, unspecified: Secondary | ICD-10-CM | POA: Diagnosis not present

## 2015-01-30 ENCOUNTER — Other Ambulatory Visit: Payer: Self-pay | Admitting: Obstetrics and Gynecology

## 2015-01-30 ENCOUNTER — Telehealth: Payer: Self-pay | Admitting: Obstetrics and Gynecology

## 2015-01-30 DIAGNOSIS — Z79890 Hormone replacement therapy (postmenopausal): Secondary | ICD-10-CM

## 2015-01-30 MED ORDER — ESTRADIOL 1 MG PO TABS: 1.0000 mg | ORAL_TABLET | Freq: Every day | ORAL | Status: DC

## 2015-01-31 DIAGNOSIS — M797 Fibromyalgia: Secondary | ICD-10-CM | POA: Diagnosis not present

## 2015-01-31 DIAGNOSIS — M5136 Other intervertebral disc degeneration, lumbar region: Secondary | ICD-10-CM | POA: Diagnosis not present

## 2015-02-05 DIAGNOSIS — M1712 Unilateral primary osteoarthritis, left knee: Secondary | ICD-10-CM | POA: Insufficient documentation

## 2015-02-06 DIAGNOSIS — M1712 Unilateral primary osteoarthritis, left knee: Secondary | ICD-10-CM | POA: Diagnosis not present

## 2015-02-07 DIAGNOSIS — M5410 Radiculopathy, site unspecified: Secondary | ICD-10-CM | POA: Diagnosis not present

## 2015-02-07 DIAGNOSIS — M13162 Monoarthritis, not elsewhere classified, left knee: Secondary | ICD-10-CM | POA: Diagnosis not present

## 2015-02-21 ENCOUNTER — Ambulatory Visit (INDEPENDENT_AMBULATORY_CARE_PROVIDER_SITE_OTHER): Payer: Medicare Other | Admitting: Psychiatry

## 2015-02-21 ENCOUNTER — Telehealth: Payer: Self-pay | Admitting: Obstetrics and Gynecology

## 2015-02-21 ENCOUNTER — Encounter (HOSPITAL_COMMUNITY): Payer: Self-pay | Admitting: Psychiatry

## 2015-02-21 VITALS — BP 118/77 | HR 68 | Ht 65.5 in | Wt 201.6 lb

## 2015-02-21 DIAGNOSIS — F331 Major depressive disorder, recurrent, moderate: Secondary | ICD-10-CM | POA: Diagnosis not present

## 2015-02-21 MED ORDER — METHYLPHENIDATE HCL 20 MG PO TABS: 20.0000 mg | ORAL_TABLET | Freq: Two times a day (BID) | ORAL | Status: DC

## 2015-02-21 MED ORDER — ALPRAZOLAM 1 MG PO TABS: 1.0000 mg | ORAL_TABLET | Freq: Two times a day (BID) | ORAL | Status: DC

## 2015-02-21 MED ORDER — DULOXETINE HCL 60 MG PO CPEP: 60.0000 mg | ORAL_CAPSULE | Freq: Every day | ORAL | Status: DC
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
## 2015-02-23 DIAGNOSIS — M797 Fibromyalgia: Secondary | ICD-10-CM | POA: Diagnosis not present

## 2015-02-23 DIAGNOSIS — M9983 Other biomechanical lesions of lumbar region: Secondary | ICD-10-CM | POA: Diagnosis not present

## 2015-02-23 DIAGNOSIS — M47817 Spondylosis without myelopathy or radiculopathy, lumbosacral region: Secondary | ICD-10-CM | POA: Diagnosis not present

## 2015-02-23 DIAGNOSIS — M5136 Other intervertebral disc degeneration, lumbar region: Secondary | ICD-10-CM | POA: Diagnosis not present

## 2015-02-26 ENCOUNTER — Other Ambulatory Visit: Payer: Self-pay | Admitting: Obstetrics and Gynecology

## 2015-02-26 DIAGNOSIS — R55 Syncope and collapse: Secondary | ICD-10-CM | POA: Insufficient documentation

## 2015-02-26 MED ORDER — ESTRADIOL 1 MG PO TABS: 1.0000 mg | ORAL_TABLET | Freq: Every day | ORAL | Status: DC

## 2015-03-05 DIAGNOSIS — F419 Anxiety disorder, unspecified: Secondary | ICD-10-CM | POA: Diagnosis not present

## 2015-03-05 DIAGNOSIS — M47817 Spondylosis without myelopathy or radiculopathy, lumbosacral region: Secondary | ICD-10-CM | POA: Diagnosis not present

## 2015-03-05 DIAGNOSIS — M9983 Other biomechanical lesions of lumbar region: Secondary | ICD-10-CM | POA: Diagnosis not present

## 2015-03-05 DIAGNOSIS — Z79899 Other long term (current) drug therapy: Secondary | ICD-10-CM | POA: Diagnosis not present

## 2015-03-05 DIAGNOSIS — M47816 Spondylosis without myelopathy or radiculopathy, lumbar region: Secondary | ICD-10-CM | POA: Diagnosis not present

## 2015-03-05 DIAGNOSIS — I10 Essential (primary) hypertension: Secondary | ICD-10-CM | POA: Diagnosis not present

## 2015-03-05 DIAGNOSIS — F329 Major depressive disorder, single episode, unspecified: Secondary | ICD-10-CM | POA: Diagnosis not present

## 2015-03-05 DIAGNOSIS — E785 Hyperlipidemia, unspecified: Secondary | ICD-10-CM | POA: Diagnosis not present

## 2015-03-05 DIAGNOSIS — M5136 Other intervertebral disc degeneration, lumbar region: Secondary | ICD-10-CM | POA: Diagnosis not present

## 2015-03-05 DIAGNOSIS — M797 Fibromyalgia: Secondary | ICD-10-CM | POA: Diagnosis not present

## 2015-04-20 DIAGNOSIS — M5136 Other intervertebral disc degeneration, lumbar region: Secondary | ICD-10-CM | POA: Diagnosis not present

## 2015-04-20 DIAGNOSIS — M1712 Unilateral primary osteoarthritis, left knee: Secondary | ICD-10-CM | POA: Diagnosis not present

## 2015-04-20 DIAGNOSIS — M797 Fibromyalgia: Secondary | ICD-10-CM | POA: Diagnosis not present

## 2015-04-20 DIAGNOSIS — M25562 Pain in left knee: Secondary | ICD-10-CM | POA: Diagnosis not present

## 2015-04-23 DIAGNOSIS — R203 Hyperesthesia: Secondary | ICD-10-CM | POA: Diagnosis not present

## 2015-04-23 DIAGNOSIS — E782 Mixed hyperlipidemia: Secondary | ICD-10-CM | POA: Diagnosis not present

## 2015-04-23 DIAGNOSIS — I10 Essential (primary) hypertension: Secondary | ICD-10-CM | POA: Diagnosis not present

## 2015-04-25 DIAGNOSIS — I10 Essential (primary) hypertension: Secondary | ICD-10-CM | POA: Diagnosis not present

## 2015-04-30 DIAGNOSIS — Z79899 Other long term (current) drug therapy: Secondary | ICD-10-CM | POA: Diagnosis not present

## 2015-04-30 DIAGNOSIS — M797 Fibromyalgia: Secondary | ICD-10-CM | POA: Diagnosis not present

## 2015-04-30 DIAGNOSIS — F419 Anxiety disorder, unspecified: Secondary | ICD-10-CM | POA: Diagnosis not present

## 2015-04-30 DIAGNOSIS — M9983 Other biomechanical lesions of lumbar region: Secondary | ICD-10-CM | POA: Diagnosis not present

## 2015-04-30 DIAGNOSIS — M25562 Pain in left knee: Secondary | ICD-10-CM | POA: Diagnosis not present

## 2015-04-30 DIAGNOSIS — M2242 Chondromalacia patellae, left knee: Secondary | ICD-10-CM | POA: Diagnosis not present

## 2015-04-30 DIAGNOSIS — M47817 Spondylosis without myelopathy or radiculopathy, lumbosacral region: Secondary | ICD-10-CM | POA: Diagnosis not present

## 2015-04-30 DIAGNOSIS — G479 Sleep disorder, unspecified: Secondary | ICD-10-CM | POA: Diagnosis not present

## 2015-04-30 DIAGNOSIS — E785 Hyperlipidemia, unspecified: Secondary | ICD-10-CM | POA: Diagnosis not present

## 2015-04-30 DIAGNOSIS — M1712 Unilateral primary osteoarthritis, left knee: Secondary | ICD-10-CM | POA: Diagnosis not present

## 2015-04-30 DIAGNOSIS — I10 Essential (primary) hypertension: Secondary | ICD-10-CM | POA: Diagnosis not present

## 2015-04-30 DIAGNOSIS — M5136 Other intervertebral disc degeneration, lumbar region: Secondary | ICD-10-CM | POA: Diagnosis not present

## 2015-04-30 DIAGNOSIS — F329 Major depressive disorder, single episode, unspecified: Secondary | ICD-10-CM | POA: Diagnosis not present

## 2015-05-08 ENCOUNTER — Ambulatory Visit (INDEPENDENT_AMBULATORY_CARE_PROVIDER_SITE_OTHER): Payer: Medicare Other | Admitting: Internal Medicine

## 2015-05-08 ENCOUNTER — Encounter (INDEPENDENT_AMBULATORY_CARE_PROVIDER_SITE_OTHER): Payer: Self-pay | Admitting: Internal Medicine

## 2015-05-08 VITALS — BP 130/90 | HR 64 | Temp 98.4°F | Resp 18 | Ht 65.0 in | Wt 206.6 lb

## 2015-05-08 DIAGNOSIS — K589 Irritable bowel syndrome without diarrhea: Secondary | ICD-10-CM

## 2015-05-08 DIAGNOSIS — R159 Full incontinence of feces: Secondary | ICD-10-CM | POA: Diagnosis not present

## 2015-05-08 MED ORDER — LOPERAMIDE HCL 2 MG PO CAPS: 2.0000 mg | ORAL_CAPSULE | Freq: Every day | ORAL | Status: DC

## 2015-05-08 MED ORDER — PSYLLIUM 28 % PO PACK: 1.0000 | PACK | Freq: Every day | ORAL | Status: DC

## 2015-05-08 NOTE — Patient Instructions (Signed)
Remember to do Kegel exercises at least twice daily. Keep symptom diary as to frequency and consistency of stools and also when you have fecal seepage or incontinence. Can use glycerin or Dulcolax suppository if you become constipated but no laxatives by mouth.

## 2015-05-08 NOTE — Progress Notes (Signed)
Presenting complaint;  Loose stools and fecal incontinence.  Database:  Patient is 65 year old Caucasian female who is here for scheduled visit. She was last seen on 10/31/2014 for frequent bowel movements and fecal incontinence. In July last year she had celiac antibody panel was negative. She had colonoscopy in August 2016 and sigmoid colon biopsies were negative for mitral scopic colitis. Her last visit she was advised to take fiber supplement daily and 1 tablet of stool softer.  Subjective:  Patient states she is not any better. She is having fecal seepage at least 3 times every week. Discharge occurs when she uses abdominal muscles. At times she is not even there. On few occasions it has been real bad and quite large. She remains with multiple bowel movements. She has at least 5-6 stools per day. She either passes flask scrips orbits and pieces. She has occasional hematochezia in the form of blood in the tissue. She has not experienced nocturnal seepage or leakage. She remains with good appetite. She has gained 15 pounds since her last visit. She believes it is because of steroid injections for arthritis and back problems. She is not taking fiber supplement: His on regular basis. She is not doing Kegel exercises as recommended. She takes magnesium every day.    Current Medications: Outpatient Encounter Prescriptions as of 05/08/2015  Medication Sig  . acetaminophen (TYLENOL) 500 MG tablet Take 500 mg by mouth 2 (two) times daily.  Marland Kitchen ALPRAZolam (XANAX) 1 MG tablet Take 1 tablet (1 mg total) by mouth 2 (two) times daily.  . cetirizine (ZYRTEC) 10 MG tablet Take 10 mg by mouth daily as needed. Allergies  . DimenhyDRINATE (DRAMAMINE PO) Take 1 tablet by mouth daily.  Marland Kitchen docusate sodium (COLACE) 100 MG capsule Take 1 capsule (100 mg total) by mouth at bedtime.  . DULoxetine (CYMBALTA) 60 MG capsule Take 1 capsule (60 mg total) by mouth daily.  Marland Kitchen estradiol (ESTRACE) 1 MG tablet Take 1  tablet (1 mg total) by mouth daily.  Marland Kitchen FIBER PO Take by mouth. PRN  . fluticasone (FLONASE) 50 MCG/ACT nasal spray Place 1 spray into both nostrils daily.   Marland Kitchen lisinopril-hydrochlorothiazide (PRINZIDE,ZESTORETIC) 20-12.5 MG per tablet Take 1 tablet by mouth 2 (two) times daily.  . Methenamine-Sodium Salicylate (CYSTEX) XX123456 MG TABS Take 2 tablets by mouth daily as needed. For urinary pain  . metoprolol (LOPRESSOR) 100 MG tablet Take 100 mg by mouth 2 (two) times daily.   . ondansetron (ZOFRAN) 4 MG tablet Take 1 tablet (4 mg total) by mouth 2 (two) times daily as needed for nausea or vomiting. (Patient taking differently: Take 4 mg by mouth as needed for nausea or vomiting. )  . OVER THE COUNTER MEDICATION Isotonic - Calcium -1 capful at night  . OVER THE COUNTER MEDICATION Isotonic - Magnesium - 1 capful at night.  . Probiotic Product (PROBIOTIC DAILY PO) Take by mouth daily. Patient states that she takes 2-4 per day.  . simvastatin (ZOCOR) 20 MG tablet Take 10 mg by mouth every evening.   . Wheat Dextrin (BENEFIBER DRINK MIX) PACK Take 4 g by mouth at bedtime.  . [DISCONTINUED] amLODipine (NORVASC) 10 MG tablet Take 10 mg by mouth daily. Reported on 05/08/2015  . [DISCONTINUED] methylphenidate (RITALIN) 20 MG tablet Take 1 tablet (20 mg total) by mouth 2 (two) times daily with breakfast and lunch. (Patient not taking: Reported on 05/08/2015)  . [DISCONTINUED] methylphenidate (RITALIN) 20 MG tablet Take 1 tablet (20 mg total) by mouth  2 (two) times daily with breakfast and lunch. (Patient not taking: Reported on 05/08/2015)  . [DISCONTINUED] methylphenidate (RITALIN) 20 MG tablet Take 1 tablet (20 mg total) by mouth 2 (two) times daily with breakfast and lunch. (Patient not taking: Reported on 05/08/2015)   No facility-administered encounter medications on file as of 05/08/2015.     Objective: Blood pressure 130/90, pulse 64, temperature 98.4 F (36.9 C), temperature source Oral, resp. rate  18, height 5\' 5"  (1.651 m), weight 206 lb 9.6 oz (93.713 kg). Patient is alert and in no acute distress. Conjunctiva is pink. Sclera is nonicteric Oropharyngeal mucosa is normal. No neck masses or thyromegaly noted. Cardiac exam with regular rhythm normal S1 and S2. No murmur or gallop noted. Lungs are clear to auscultation. Abdomen is full. Bowel sounds are normal. On palpation abdomen is soft and nontender without organomegaly or masses. No LE edema or or clubbing noted.   Assessment:  #1. Diarrhea most likely secondary to IBS. Sigmoid colon biopsies in August 2016 were negative for microscopic colitis. She is not taking fiber supplement as recommended. #2. Fecal incontinence. This may be due to diarrhea. Digital exam her sphincter was felt to be normal and she was advised to do Kegel exercises which she is not doing regularly.   Plan:  Discontinue current fiber preparations. Metamucil 4 g by mouth daily at bedtime 3 g by mouth twice a day. Imodium OTC 2 mg by mouth every morning. Discontinue Colace and magnesium for now. Stool diary for next 8 weeks. Office visit in 8 weeks

## 2015-05-21 ENCOUNTER — Encounter (HOSPITAL_COMMUNITY): Payer: Self-pay | Admitting: Psychiatry

## 2015-05-21 ENCOUNTER — Ambulatory Visit (INDEPENDENT_AMBULATORY_CARE_PROVIDER_SITE_OTHER): Payer: Medicare Other | Admitting: Psychiatry

## 2015-05-21 VITALS — BP 140/54 | HR 58 | Ht 65.0 in | Wt 198.4 lb

## 2015-05-21 DIAGNOSIS — F331 Major depressive disorder, recurrent, moderate: Secondary | ICD-10-CM

## 2015-05-21 MED ORDER — ALPRAZOLAM 1 MG PO TABS: 1.0000 mg | ORAL_TABLET | Freq: Two times a day (BID) | ORAL | Status: DC

## 2015-05-21 MED ORDER — METHYLPHENIDATE HCL 20 MG PO TABS: 20.0000 mg | ORAL_TABLET | Freq: Two times a day (BID) | ORAL | Status: DC

## 2015-05-21 MED ORDER — DULOXETINE HCL 60 MG PO CPEP: 60.0000 mg | ORAL_CAPSULE | Freq: Every day | ORAL | Status: DC
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
## 2015-05-29 DIAGNOSIS — M25562 Pain in left knee: Secondary | ICD-10-CM | POA: Diagnosis not present

## 2015-05-29 DIAGNOSIS — Z79899 Other long term (current) drug therapy: Secondary | ICD-10-CM | POA: Diagnosis not present

## 2015-05-29 DIAGNOSIS — M1712 Unilateral primary osteoarthritis, left knee: Secondary | ICD-10-CM | POA: Diagnosis not present

## 2015-05-29 DIAGNOSIS — Z7951 Long term (current) use of inhaled steroids: Secondary | ICD-10-CM | POA: Diagnosis not present

## 2015-06-19 DIAGNOSIS — M797 Fibromyalgia: Secondary | ICD-10-CM | POA: Diagnosis not present

## 2015-06-19 DIAGNOSIS — Z79899 Other long term (current) drug therapy: Secondary | ICD-10-CM | POA: Diagnosis not present

## 2015-06-19 DIAGNOSIS — M545 Low back pain: Secondary | ICD-10-CM | POA: Diagnosis not present

## 2015-06-19 DIAGNOSIS — M1712 Unilateral primary osteoarthritis, left knee: Secondary | ICD-10-CM | POA: Diagnosis not present

## 2015-06-19 DIAGNOSIS — M47817 Spondylosis without myelopathy or radiculopathy, lumbosacral region: Secondary | ICD-10-CM | POA: Diagnosis not present

## 2015-07-02 DIAGNOSIS — F329 Major depressive disorder, single episode, unspecified: Secondary | ICD-10-CM | POA: Diagnosis not present

## 2015-07-02 DIAGNOSIS — M545 Low back pain: Secondary | ICD-10-CM | POA: Diagnosis not present

## 2015-07-02 DIAGNOSIS — M797 Fibromyalgia: Secondary | ICD-10-CM | POA: Diagnosis not present

## 2015-07-02 DIAGNOSIS — M1712 Unilateral primary osteoarthritis, left knee: Secondary | ICD-10-CM | POA: Diagnosis not present

## 2015-07-02 DIAGNOSIS — M47817 Spondylosis without myelopathy or radiculopathy, lumbosacral region: Secondary | ICD-10-CM | POA: Diagnosis not present

## 2015-07-02 DIAGNOSIS — Z79899 Other long term (current) drug therapy: Secondary | ICD-10-CM | POA: Diagnosis not present

## 2015-07-02 DIAGNOSIS — Z79891 Long term (current) use of opiate analgesic: Secondary | ICD-10-CM | POA: Diagnosis not present

## 2015-07-19 ENCOUNTER — Ambulatory Visit (INDEPENDENT_AMBULATORY_CARE_PROVIDER_SITE_OTHER): Payer: Self-pay | Admitting: Internal Medicine

## 2015-07-20 ENCOUNTER — Encounter (INDEPENDENT_AMBULATORY_CARE_PROVIDER_SITE_OTHER): Payer: Self-pay | Admitting: Internal Medicine

## 2015-07-23 DIAGNOSIS — M545 Low back pain: Secondary | ICD-10-CM | POA: Diagnosis not present

## 2015-07-23 DIAGNOSIS — M47817 Spondylosis without myelopathy or radiculopathy, lumbosacral region: Secondary | ICD-10-CM | POA: Diagnosis not present

## 2015-07-25 DIAGNOSIS — M797 Fibromyalgia: Secondary | ICD-10-CM | POA: Diagnosis not present

## 2015-07-25 DIAGNOSIS — I10 Essential (primary) hypertension: Secondary | ICD-10-CM | POA: Diagnosis not present

## 2015-07-25 DIAGNOSIS — R0789 Other chest pain: Secondary | ICD-10-CM | POA: Diagnosis not present

## 2015-07-25 DIAGNOSIS — R51 Headache: Secondary | ICD-10-CM | POA: Diagnosis not present

## 2015-07-25 DIAGNOSIS — R0602 Shortness of breath: Secondary | ICD-10-CM | POA: Diagnosis not present

## 2015-07-25 DIAGNOSIS — R079 Chest pain, unspecified: Secondary | ICD-10-CM | POA: Diagnosis not present

## 2015-07-25 DIAGNOSIS — Z79899 Other long term (current) drug therapy: Secondary | ICD-10-CM | POA: Diagnosis not present

## 2015-07-26 DIAGNOSIS — R079 Chest pain, unspecified: Secondary | ICD-10-CM | POA: Diagnosis not present

## 2015-07-29 ENCOUNTER — Emergency Department (HOSPITAL_COMMUNITY)
Admission: EM | Admit: 2015-07-29 | Discharge: 2015-07-29 | Disposition: A | Discharge status: Home / Self Care | Payer: Medicare Other | Attending: Emergency Medicine | Admitting: Emergency Medicine

## 2015-07-29 ENCOUNTER — Emergency Department (HOSPITAL_COMMUNITY): Payer: Medicare Other

## 2015-07-29 ENCOUNTER — Encounter (HOSPITAL_COMMUNITY): Payer: Self-pay | Admitting: Emergency Medicine

## 2015-07-29 DIAGNOSIS — R0789 Other chest pain: Secondary | ICD-10-CM | POA: Diagnosis not present

## 2015-07-29 DIAGNOSIS — F329 Major depressive disorder, single episode, unspecified: Secondary | ICD-10-CM | POA: Insufficient documentation

## 2015-07-29 DIAGNOSIS — R079 Chest pain, unspecified: Secondary | ICD-10-CM | POA: Diagnosis not present

## 2015-07-29 DIAGNOSIS — I10 Essential (primary) hypertension: Secondary | ICD-10-CM | POA: Diagnosis not present

## 2015-07-29 DIAGNOSIS — Z79899 Other long term (current) drug therapy: Secondary | ICD-10-CM | POA: Insufficient documentation

## 2015-07-29 LAB — I-STAT TROPONIN, ED: Troponin i, poc: 0 ng/mL (ref 0.00–0.08)

## 2015-07-29 LAB — CBC
HEMATOCRIT: 36.6 % (ref 36.0–46.0)
HEMOGLOBIN: 12.1 g/dL (ref 12.0–15.0)
MCH: 29.1 pg (ref 26.0–34.0)
MCHC: 33.1 g/dL (ref 30.0–36.0)
MCV: 88 fL (ref 78.0–100.0)
Platelets: 247 10*3/uL (ref 150–400)
RBC: 4.16 MIL/uL (ref 3.87–5.11)
RDW: 13.8 % (ref 11.5–15.5)
WBC: 7.7 10*3/uL (ref 4.0–10.5)

## 2015-07-29 LAB — BASIC METABOLIC PANEL
ANION GAP: 8 (ref 5–15)
BUN: 15 mg/dL (ref 6–20)
CHLORIDE: 96 mmol/L — AB (ref 101–111)
CO2: 33 mmol/L — AB (ref 22–32)
Calcium: 9.1 mg/dL (ref 8.9–10.3)
Creatinine, Ser: 0.68 mg/dL (ref 0.44–1.00)
GFR calc Af Amer: >60 mL/min (ref >60)
GFR calc non Af Amer: >60 mL/min (ref >60)
GLUCOSE: 91 mg/dL (ref 65–99)
POTASSIUM: 3.4 mmol/L — AB (ref 3.5–5.1)
Sodium: 137 mmol/L (ref 135–145)

## 2015-07-29 LAB — D-DIMER, QUANTITATIVE: D-Dimer, Quant: 0.31 ug/mL-FEU (ref 0.00–0.50)

## 2015-07-29 MED ORDER — ACETAMINOPHEN 500 MG PO TABS
1000.0000 mg | ORAL_TABLET | Freq: Once | ORAL | Status: AC
  Administered 2015-07-29: 1000 mg via ORAL
  Filled 2015-07-29: qty 2

## 2015-07-29 NOTE — ED Provider Notes (Signed)
CSN: [id number]     Arrival date & time 07/29/15  1705 History   First MD Initiated Contact with Patient 07/29/15 1710     Chief Complaint  Patient presents with  . Chest Pain     (Consider location/radiation/quality/duration/timing/severity/associated sxs/prior Treatment) HPI...Marland KitchenMarland KitchenPatient reports weakness with no energy. She was admitted to [facility] on Wednesday and discharged on Thursday for elevated blood pressure. She is on lisinopril, metoprolol; and Norvasc was recently added. She complains of tightness in her chest, lethargy, dyspnea, no diaphoresis or nausea. No history of MI. No smoking. No diabetes. Pain radiates to the jaw. Review systems positive for headache.  Past Medical History  Diagnosis Date  . Hypertension   . Cystitides, interstitial, chronic   . Fibromyalgia   . Urinary tract infection   . Urinary disorder     bladder is in sling per pt  . High cholesterol   . Back pain   . Anxiety   . Depression   . Shortness of breath   . GERD (gastroesophageal reflux disease)   . Vertigo   . Sleep apnea    Past Surgical History  Procedure Laterality Date  . Abdominal hysterectomy  1997    [place] , [place]    . Toe surgery    . Bladder tack    . Laparoscopic salpingo oopherectomy  02/17/2012    Procedure: LAPAROSCOPIC SALPINGO OOPHORECTOMY;  Surgeon: Jonnie Kind, MD;  Location: [facility] ORS;  Service: Gynecology;  Laterality: Bilateral;  . Knee surgery Left 05/26/14    meniscus tear  . Colonoscopy N/A 08/23/2014    Procedure: COLONOSCOPY;  Surgeon: Rogene Houston, MD;  Location: [facility] END[facility] SUITE;  Service: Endoscopy;  Laterality: N/A;  1200  . Back surgery     Family History  Problem Relation Age of Onset  . Atrial fibrillation Mother   . COPD Mother   . Hypertension Mother   . Hypertension Father   . Cancer Father     throat   . Drug abuse Brother   . Alcohol abuse Brother   . Early death Brother   . Fibromyalgia Daughter   . Paranoid  behavior Son   . Alcohol abuse Son   . Hypertension Brother   . Sleep apnea Brother   . Alcohol abuse Brother   . Drug abuse Brother    Social History  Substance Use Topics  . Smoking status: Never Smoker   . Smokeless tobacco: Never Used  . Alcohol Use: No     Comment: occassionalym, pt denies any use    OB History    No data available     Review of Systems  All other systems reviewed and are negative.     Allergies  Review of patient's allergies indicates no active allergies.  Home Medications   Prior to Admission medications   Medication Sig Start Date End Date Taking? Authorizing Provider  acetaminophen (TYLENOL) 500 MG tablet Take 500 mg by mouth 2 (two) times daily.   Yes Historical Provider, MD  ALPRAZolam Duanne Moron) 1 MG tablet Take 1 tablet (1 mg total) by mouth 2 (two) times daily. Patient taking differently: Take 0.5 mg by mouth 2 (two) times daily.  05/21/15 05/20/16 Yes Cloria Spring, MD  amLODipine (NORVASC) 5 MG tablet Take 5 mg by mouth daily.   Yes Historical Provider, MD  cetirizine (ZYRTEC) 10 MG tablet Take 10 mg by mouth daily as needed. Allergies   Yes Historical Provider, MD  DimenhyDRINATE (DRAMAMINE PO) Take 1 tablet by mouth daily.   Yes Historical Provider, MD  DULoxetine (CYMBALTA) 60 MG capsule Take 1 capsule (60 mg total) by mouth daily. 05/21/15  Yes Cloria Spring, MD  estradiol (ESTRACE) 1 MG tablet Take 1 tablet (1 mg total) by mouth daily. 02/26/15 02/26/16 Yes Jonnie Kind, MD  fluticasone (FLONASE) 50 MCG/ACT nasal spray Place 1 spray into both nostrils daily.    Yes Historical Provider, MD  gabapentin (NEURONTIN) 300 MG capsule Take 300 mg by mouth 3 (three) times daily.   Yes Historical Provider, MD  lisinopril-hydrochlorothiazide (PRINZIDE,ZESTORETIC) 20-12.5 MG per tablet Take 1 tablet by mouth 2 (two) times daily.   Yes Historical Provider, MD  loperamide (IMODIUM) 2 MG capsule Take 1 capsule (2 mg total) by mouth daily before breakfast.  05/08/15  Yes Rogene Houston, MD  meclizine (ANTIVERT) 25 MG tablet Take 25 mg by mouth every 6 (six) hours as needed for dizziness.   Yes Historical Provider, MD  Methenamine-Sodium Salicylate (CYSTEX) XX123456 MG TABS Take 2 tablets by mouth daily as needed. For urinary pain   Yes Historical Provider, MD  metoprolol (LOPRESSOR) 100 MG tablet Take 100 mg by mouth 2 (two) times daily.    Yes Historical Provider, MD  ondansetron (ZOFRAN) 4 MG tablet Take 1 tablet (4 mg total) by mouth 2 (two) times daily as needed for nausea or vomiting. Patient taking differently: Take 4 mg by mouth as needed for nausea or vomiting.  08/25/14  Yes Rogene Houston, MD  Probiotic Product (PROBIOTIC DAILY PO) Take by mouth daily. Patient states that she takes 2-4 per day.   Yes Historical Provider, MD  psyllium (METAMUCIL SMOOTH TEXTURE) 28 % packet Take 1 packet by mouth at bedtime. 05/08/15  Yes Rogene Houston, MD  simvastatin (ZOCOR) 20 MG tablet Take 10 mg by mouth every evening.    Yes Historical Provider, MD  traMADol (ULTRAM) 50 MG tablet Take 50 mg by mouth 3 (three) times daily.   Yes Historical Provider, MD  methylphenidate (RITALIN) 20 MG tablet Take 1 tablet (20 mg total) by mouth 2 (two) times daily with breakfast and lunch. Patient not taking: Reported on 07/29/2015 05/21/15 05/20/16  Cloria Spring, MD   BP 124/56 mmHg  Pulse 70  Temp(Src) 97.8 F (36.6 C) (Oral)  Resp 22  Ht 5' 5.5" (1.664 m)  Wt 193 lb (87.544 kg)  BMI 31.62 kg/m2  SpO2 95% Physical Exam  Constitutional: She is oriented to person, place, and time. She appears well-developed and well-nourished.  HENT:  Head: Normocephalic and atraumatic.  Eyes: Conjunctivae are normal.  Neck: Neck supple.  Cardiovascular: Normal rate and regular rhythm.   Pulmonary/Chest: Effort normal and breath sounds normal.  Abdominal: Soft. Bowel sounds are normal.  Musculoskeletal: Normal range of motion.  Neurological: She is alert and oriented to  person, place, and time.  Skin: Skin is warm and dry.  Psychiatric: She has a normal mood and affect. Her behavior is normal.  Nursing note and vitals reviewed.   ED Course  Procedures (including critical care time) Labs Review Labs Reviewed  BASIC METABOLIC PANEL - Abnormal; Notable for the following:    Potassium 3.4 (*)    Chloride 96 (*)    CO2 33 (*)    All other components within normal limits  CBC  D-DIMER, QUANTITATIVE (NOT AT [facility])  Randolm Idol, ED    Imaging Review Dg Chest 2 View  07/29/2015  CLINICAL DATA:  Chest pain EXAM: CHEST  2 VIEW COMPARISON:  March 05, 2012 FINDINGS: The heart size is borderline. The hila and mediastinum are normal. No pneumothorax. No pulmonary nodules, masses, or focal infiltrates. IMPRESSION: No active cardiopulmonary disease. Electronically Signed   By: Dorise Bullion III M.D   On: 07/29/2015 19:15   I have personally reviewed and evaluated these images and lab results as part of my medical decision-making.   EKG Interpretation   Date/Time:  Sunday July 29 2015 17:15:50 EDT Ventricular Rate:  62 PR Interval:    QRS Duration: 98 QT Interval:  412 QTC Calculation: 419 R Axis:   27 Text Interpretation:  Sinus rhythm Baseline wander in lead(s) I aVL  Confirmed by Tereasa Yilmaz  MD, Teaghan Formica ([id number]) on 07/29/2015 5:36:16 PM      MDM   Final diagnoses:  Chest pain, unspecified chest pain type    Screening tests here including EKG, chest x-ray, troponin, d-dimer all negative. Symptoms could be suggestive of angina. Referral to local cardiologist. Discussed findings with the patient and her husband. Patient understands to return if worse.    Nat Christen, MD 07/29/15 2148

## 2015-07-29 NOTE — ED Notes (Signed)
Patient c/o chest pain that woke her on Wednesday in which she was seen at [facility]. Patient reports having EKG, blood work, x-ray, and CT. Per patient admitted and discharged on Thursday. Patient reports feeling chest tightness today that radiated into jaw. Per patient shortness of breath, extreme weakness, dull headache, nausea, and lightheadedness today.

## 2015-07-29 NOTE — Discharge Instructions (Signed)
Tests showed no life-threatening condition. Follow-up with cardiology. Phone number given. Return if worse.

## 2015-08-02 DIAGNOSIS — R079 Chest pain, unspecified: Secondary | ICD-10-CM | POA: Insufficient documentation

## 2015-08-02 DIAGNOSIS — R519 Headache, unspecified: Secondary | ICD-10-CM | POA: Insufficient documentation

## 2015-08-03 DIAGNOSIS — Z6832 Body mass index (BMI) 32.0-32.9, adult: Secondary | ICD-10-CM | POA: Diagnosis not present

## 2015-08-03 DIAGNOSIS — R51 Headache: Secondary | ICD-10-CM | POA: Diagnosis not present

## 2015-08-03 DIAGNOSIS — R079 Chest pain, unspecified: Secondary | ICD-10-CM | POA: Diagnosis not present

## 2015-08-03 DIAGNOSIS — I10 Essential (primary) hypertension: Secondary | ICD-10-CM | POA: Diagnosis not present

## 2015-08-03 DIAGNOSIS — Z00129 Encounter for routine child health examination without abnormal findings: Secondary | ICD-10-CM | POA: Diagnosis not present

## 2015-08-06 DIAGNOSIS — R079 Chest pain, unspecified: Secondary | ICD-10-CM | POA: Diagnosis not present

## 2015-08-06 DIAGNOSIS — I10 Essential (primary) hypertension: Secondary | ICD-10-CM | POA: Diagnosis not present

## 2015-08-07 ENCOUNTER — Other Ambulatory Visit (HOSPITAL_COMMUNITY): Payer: Self-pay | Admitting: Psychiatry

## 2015-08-07 ENCOUNTER — Telehealth (INDEPENDENT_AMBULATORY_CARE_PROVIDER_SITE_OTHER): Payer: Self-pay | Admitting: Internal Medicine

## 2015-08-07 NOTE — Telephone Encounter (Signed)
Patient called, stated that she has been keeping up with when and how many bm's she has a day, dates she did this for was 05/09/15 - 06/11/15.  Her next appointment with [place] is 09/11/15.  She wants to know if this info will be enough when she comes in, does she need to continue tracking this or not.  She stated it was a lot of trouble.  [phone]

## 2015-08-08 NOTE — Telephone Encounter (Signed)
Per Dr.Rehman the patient may take the gummy form of Fiber. Patient was called and made aware.

## 2015-08-08 NOTE — Telephone Encounter (Signed)
Per Dr.Rehman the patient is only to keep record when she has a normal BM. Patient was called and made aware.

## 2015-08-08 NOTE — Telephone Encounter (Signed)
Patient wants to know if she may take her PO fiber instead of the powder. She also says that she hasn't had any normal BM, and feels that it is related to being without the fiber.

## 2015-08-13 DIAGNOSIS — M47817 Spondylosis without myelopathy or radiculopathy, lumbosacral region: Secondary | ICD-10-CM | POA: Diagnosis not present

## 2015-08-13 DIAGNOSIS — M545 Low back pain: Secondary | ICD-10-CM | POA: Diagnosis not present

## 2015-08-20 ENCOUNTER — Ambulatory Visit (INDEPENDENT_AMBULATORY_CARE_PROVIDER_SITE_OTHER): Payer: Medicare Other | Admitting: Psychiatry

## 2015-08-20 ENCOUNTER — Encounter (HOSPITAL_COMMUNITY): Payer: Self-pay | Admitting: Psychiatry

## 2015-08-20 VITALS — BP 123/63 | HR 61 | Ht 65.5 in | Wt 194.8 lb

## 2015-08-20 DIAGNOSIS — F411 Generalized anxiety disorder: Secondary | ICD-10-CM | POA: Diagnosis not present

## 2015-08-20 DIAGNOSIS — F331 Major depressive disorder, recurrent, moderate: Secondary | ICD-10-CM | POA: Diagnosis not present

## 2015-08-20 MED ORDER — DULOXETINE HCL 60 MG PO CPEP: 60.0000 mg | ORAL_CAPSULE | Freq: Two times a day (BID) | ORAL | 2 refills | Status: DC

## 2015-08-20 MED ORDER — METHYLPHENIDATE HCL 20 MG PO TABS: 20.0000 mg | ORAL_TABLET | Freq: Two times a day (BID) | ORAL | 0 refills | Status: DC

## 2015-08-20 MED ORDER — ALPRAZOLAM 1 MG PO TABS: 1.0000 mg | ORAL_TABLET | Freq: Two times a day (BID) | ORAL | 2 refills | Status: DC
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
## 2015-08-22 ENCOUNTER — Telehealth (HOSPITAL_COMMUNITY): Payer: Self-pay | Admitting: *Deleted

## 2015-08-22 NOTE — Telephone Encounter (Signed)
patient cancelled appointment with Dr. Sima Matas, she said she wants specifically, a licensed, marriage counselor, with the initials for the marriage counselor behind their name.

## 2015-08-22 NOTE — Telephone Encounter (Signed)
noted 

## 2015-09-07 ENCOUNTER — Other Ambulatory Visit: Payer: Self-pay | Admitting: Obstetrics and Gynecology

## 2015-09-10 ENCOUNTER — Ambulatory Visit (HOSPITAL_COMMUNITY): Payer: Self-pay | Admitting: Psychology

## 2015-09-10 DIAGNOSIS — M1712 Unilateral primary osteoarthritis, left knee: Secondary | ICD-10-CM | POA: Diagnosis not present

## 2015-09-10 DIAGNOSIS — M47817 Spondylosis without myelopathy or radiculopathy, lumbosacral region: Secondary | ICD-10-CM | POA: Diagnosis not present

## 2015-09-10 DIAGNOSIS — Z79899 Other long term (current) drug therapy: Secondary | ICD-10-CM | POA: Diagnosis not present

## 2015-09-10 DIAGNOSIS — M797 Fibromyalgia: Secondary | ICD-10-CM | POA: Diagnosis not present

## 2015-09-10 DIAGNOSIS — G894 Chronic pain syndrome: Secondary | ICD-10-CM | POA: Diagnosis not present

## 2015-09-11 ENCOUNTER — Ambulatory Visit (INDEPENDENT_AMBULATORY_CARE_PROVIDER_SITE_OTHER): Payer: Medicare Other | Admitting: Internal Medicine

## 2015-09-11 ENCOUNTER — Encounter (INDEPENDENT_AMBULATORY_CARE_PROVIDER_SITE_OTHER): Payer: Self-pay | Admitting: Internal Medicine

## 2015-09-11 ENCOUNTER — Ambulatory Visit (HOSPITAL_COMMUNITY): Payer: Self-pay | Admitting: Psychology

## 2015-09-11 VITALS — BP 130/84 | HR 64 | Temp 97.6°F | Resp 18 | Ht 65.0 in | Wt 196.6 lb

## 2015-09-11 DIAGNOSIS — K589 Irritable bowel syndrome without diarrhea: Secondary | ICD-10-CM | POA: Diagnosis not present

## 2015-09-11 MED ORDER — BISACODYL 10 MG RE SUPP: 10.0000 mg | Freq: Every day | RECTAL | 0 refills | Status: DC | PRN

## 2015-09-11 NOTE — Patient Instructions (Signed)
Use Dulcolax suppository one as-needed basis or when evacuation is incomplete. Keep stool diary as to frequency and consistency of stools for 1 month prior to next office visit.

## 2015-09-12 NOTE — Progress Notes (Signed)
Presenting complaint;  Follow-up for diarrhea and fecal seepage.  Database and Subjective:  Patient is 65 year old Caucasian female who was initially evaluated one year ago for diarrhea rectal bleeding and fecal incontinence. Colonoscopy was unremarkable and sigmoid colon biopsy was negative for microscopic colitis. Patient was last seen about 4 months ago and she was still having 5-6 stools per day. She had had a few episodes of fecal seepage. She had gained 15 pounds. She had normal sphincter tone on rectal exam. Her symptoms were felt to be due to IBS. Patient was advised to take fiber supplement daily along with Imodium 2 mg every morning. Colace and magnesium were discontinued.  She now returns for follow-up visit. She has Stool diary for the past few weeks and also in April. She is having anywhere from 1-6 stools per day. Consistency varies between loose and hard stools. She is wearing a pad she's had very few episodes of fecal seepage since her last visit. She does not remember the last time she had an episode. At times she has hard stool followed by explosive bowel movement. She is watching her diet. She has lost 10 pounds his last visit. She states she had gained 25 pounds while she was on Lyrica only for 6 weeks. She eats small breakfast and lunch and has a good dinner. She tries 323 vegetables with dinner every day. She also has back pain. She's had for nerve blocks. She had one 1 month ago.    Current Medications: Outpatient Encounter Prescriptions as of 09/11/2015  Medication Sig  . acetaminophen (TYLENOL) 500 MG tablet Take 500 mg by mouth 2 (two) times daily.  Marland Kitchen ALPRAZolam (XANAX) 1 MG tablet Take 1 tablet (1 mg total) by mouth 2 (two) times daily.  Marland Kitchen amLODipine (NORVASC) 5 MG tablet Take 5 mg by mouth daily.  . cetirizine (ZYRTEC) 10 MG tablet Take 10 mg by mouth daily as needed. Allergies  . DimenhyDRINATE (DRAMAMINE PO) Take 1 tablet by mouth daily.  . DULoxetine (CYMBALTA) 60  MG capsule Take 1 capsule (60 mg total) by mouth 2 (two) times daily.  Marland Kitchen estradiol (ESTRACE) 1 MG tablet TAKE ONE TABLET BY MOUTH DAILY.  Marland Kitchen FIBER SELECT GUMMIES PO Take 4 g by mouth 2 (two) times daily. [employer]  . fluticasone (FLONASE) 50 MCG/ACT nasal spray Place 1 spray into both nostrils daily.   Marland Kitchen gabapentin (NEURONTIN) 300 MG capsule Take 300 mg by mouth 3 (three) times daily.  Marland Kitchen lisinopril-hydrochlorothiazide (PRINZIDE,ZESTORETIC) 20-12.5 MG per tablet Take 1 tablet by mouth 2 (two) times daily.  Marland Kitchen loperamide (IMODIUM) 2 MG capsule Take 1 capsule (2 mg total) by mouth daily before breakfast.  . meclizine (ANTIVERT) 25 MG tablet Take 25 mg by mouth every 6 (six) hours as needed for dizziness.  . Methenamine-Sodium Salicylate (CYSTEX) XX123456 MG TABS Take 2 tablets by mouth daily as needed. For urinary pain  . methylphenidate (RITALIN) 20 MG tablet Take 1 tablet (20 mg total) by mouth 2 (two) times daily with breakfast and lunch.  . metoprolol (LOPRESSOR) 100 MG tablet Take 100 mg by mouth 2 (two) times daily.   . ondansetron (ZOFRAN) 4 MG tablet Take 1 tablet (4 mg total) by mouth 2 (two) times daily as needed for nausea or vomiting. (Patient taking differently: Take 4 mg by mouth as needed for nausea or vomiting. )  . Probiotic Product (PROBIOTIC DAILY PO) Take by mouth daily. Patient states that she takes 2-4 per day.  . simvastatin (  ZOCOR) 20 MG tablet Take 10 mg by mouth every evening.   . traMADol (ULTRAM) 50 MG tablet Take 50 mg by mouth 3 (three) times daily as needed.   . [DISCONTINUED] methylphenidate (RITALIN) 20 MG tablet Take 1 tablet (20 mg total) by mouth 2 (two) times daily with breakfast and lunch.  . [DISCONTINUED] psyllium (METAMUCIL SMOOTH TEXTURE) 28 % packet Take 1 packet by mouth at bedtime. (Patient not taking: Reported on 09/11/2015)   No facility-administered encounter medications on file as of 09/11/2015.      Objective: Blood pressure 130/84, pulse 64,  temperature 97.6 F (36.4 C), temperature source Oral, resp. rate 18, height 5\' 5"  (1.651 m), weight 196 lb 9.6 oz (89.2 kg). Patient is alert and in no acute distress. Conjunctiva is pink. Sclera is nonicteric Oropharyngeal mucosa is normal. No neck masses or thyromegaly noted. Cardiac exam with regular rhythm normal S1 and S2. No murmur or gallop noted. Lungs are clear to auscultation. Abdomen is full. Bowel sounds are normal. On palpation abdomen is soft and nontender without organomegaly or masses. No LE edema or clubbing noted.   Assessment:  #1. Irritable bowel syndrome. She has typical symptoms. She had colonoscopy 1 year ago and sigmoid colon biopsy was negative for microscopic colitis. Unfortunately some of her medications are affecting colonic motility.   Plan:  Patient will continue fiber supplement at 4 g by mouth daily. Use Imodium OTC 2 mg daily when necessary. Patient advised to use Dulcolax suppository when she has an urge and unable to have a bowel movement when she has sense of incomplete evacuation. Patient advised not to use OTC laxatives. She will keep stool diary for 1 month prior to her next visit in 3 months.

## 2015-10-07 DIAGNOSIS — G629 Polyneuropathy, unspecified: Secondary | ICD-10-CM | POA: Insufficient documentation

## 2015-10-07 DIAGNOSIS — E538 Deficiency of other specified B group vitamins: Secondary | ICD-10-CM | POA: Insufficient documentation

## 2015-10-07 DIAGNOSIS — R5383 Other fatigue: Secondary | ICD-10-CM | POA: Insufficient documentation

## 2015-10-07 DIAGNOSIS — R635 Abnormal weight gain: Secondary | ICD-10-CM | POA: Insufficient documentation

## 2015-10-07 DIAGNOSIS — H81319 Aural vertigo, unspecified ear: Secondary | ICD-10-CM | POA: Insufficient documentation

## 2015-10-07 DIAGNOSIS — G3184 Mild cognitive impairment, so stated: Secondary | ICD-10-CM | POA: Insufficient documentation

## 2015-10-08 DIAGNOSIS — M25562 Pain in left knee: Secondary | ICD-10-CM | POA: Diagnosis not present

## 2015-10-08 DIAGNOSIS — M1712 Unilateral primary osteoarthritis, left knee: Secondary | ICD-10-CM | POA: Diagnosis not present

## 2015-10-08 DIAGNOSIS — I10 Essential (primary) hypertension: Secondary | ICD-10-CM | POA: Diagnosis not present

## 2015-10-08 DIAGNOSIS — G3184 Mild cognitive impairment, so stated: Secondary | ICD-10-CM | POA: Diagnosis not present

## 2015-10-08 DIAGNOSIS — M797 Fibromyalgia: Secondary | ICD-10-CM | POA: Diagnosis not present

## 2015-10-08 DIAGNOSIS — R635 Abnormal weight gain: Secondary | ICD-10-CM | POA: Diagnosis not present

## 2015-10-08 DIAGNOSIS — M222X2 Patellofemoral disorders, left knee: Secondary | ICD-10-CM | POA: Diagnosis not present

## 2015-10-08 DIAGNOSIS — G629 Polyneuropathy, unspecified: Secondary | ICD-10-CM | POA: Diagnosis not present

## 2015-10-08 DIAGNOSIS — H81319 Aural vertigo, unspecified ear: Secondary | ICD-10-CM | POA: Diagnosis not present

## 2015-10-08 DIAGNOSIS — R5383 Other fatigue: Secondary | ICD-10-CM | POA: Diagnosis not present

## 2015-10-08 DIAGNOSIS — Z6832 Body mass index (BMI) 32.0-32.9, adult: Secondary | ICD-10-CM | POA: Diagnosis not present

## 2015-10-15 ENCOUNTER — Ambulatory Visit (INDEPENDENT_AMBULATORY_CARE_PROVIDER_SITE_OTHER): Payer: Medicare Other | Admitting: Psychiatry

## 2015-10-15 ENCOUNTER — Encounter (HOSPITAL_COMMUNITY): Payer: Self-pay | Admitting: Psychiatry

## 2015-10-15 VITALS — BP 136/60 | HR 70 | Ht 65.5 in | Wt 194.0 lb

## 2015-10-15 DIAGNOSIS — F331 Major depressive disorder, recurrent, moderate: Secondary | ICD-10-CM | POA: Diagnosis not present

## 2015-10-15 DIAGNOSIS — F411 Generalized anxiety disorder: Secondary | ICD-10-CM

## 2015-10-15 MED ORDER — DULOXETINE HCL 60 MG PO CPEP: 60.0000 mg | ORAL_CAPSULE | Freq: Two times a day (BID) | ORAL | 2 refills | Status: DC

## 2015-10-15 MED ORDER — ALPRAZOLAM 1 MG PO TABS: 1.0000 mg | ORAL_TABLET | Freq: Two times a day (BID) | ORAL | 2 refills | Status: DC
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
## 2015-10-22 ENCOUNTER — Other Ambulatory Visit (HOSPITAL_COMMUNITY): Payer: Self-pay | Admitting: Family Medicine

## 2015-10-22 DIAGNOSIS — M1712 Unilateral primary osteoarthritis, left knee: Secondary | ICD-10-CM | POA: Diagnosis not present

## 2015-10-22 DIAGNOSIS — M797 Fibromyalgia: Secondary | ICD-10-CM | POA: Diagnosis not present

## 2015-10-22 DIAGNOSIS — Z79891 Long term (current) use of opiate analgesic: Secondary | ICD-10-CM | POA: Diagnosis not present

## 2015-10-22 DIAGNOSIS — Z1231 Encounter for screening mammogram for malignant neoplasm of breast: Secondary | ICD-10-CM

## 2015-10-22 DIAGNOSIS — G894 Chronic pain syndrome: Secondary | ICD-10-CM | POA: Diagnosis not present

## 2015-10-22 DIAGNOSIS — M47817 Spondylosis without myelopathy or radiculopathy, lumbosacral region: Secondary | ICD-10-CM | POA: Diagnosis not present

## 2015-10-23 DIAGNOSIS — M545 Low back pain, unspecified: Secondary | ICD-10-CM | POA: Insufficient documentation

## 2015-10-23 DIAGNOSIS — M797 Fibromyalgia: Secondary | ICD-10-CM | POA: Insufficient documentation

## 2015-10-24 DIAGNOSIS — M545 Low back pain: Secondary | ICD-10-CM | POA: Diagnosis not present

## 2015-10-24 DIAGNOSIS — Z23 Encounter for immunization: Secondary | ICD-10-CM | POA: Diagnosis not present

## 2015-10-24 DIAGNOSIS — I10 Essential (primary) hypertension: Secondary | ICD-10-CM | POA: Diagnosis not present

## 2015-10-24 DIAGNOSIS — H8110 Benign paroxysmal vertigo, unspecified ear: Secondary | ICD-10-CM | POA: Diagnosis not present

## 2015-10-24 DIAGNOSIS — E782 Mixed hyperlipidemia: Secondary | ICD-10-CM | POA: Diagnosis not present

## 2015-10-24 DIAGNOSIS — M797 Fibromyalgia: Secondary | ICD-10-CM | POA: Diagnosis not present

## 2015-11-27 DIAGNOSIS — M1712 Unilateral primary osteoarthritis, left knee: Secondary | ICD-10-CM | POA: Diagnosis not present

## 2015-11-27 DIAGNOSIS — M25562 Pain in left knee: Secondary | ICD-10-CM | POA: Diagnosis not present

## 2015-11-27 DIAGNOSIS — M5416 Radiculopathy, lumbar region: Secondary | ICD-10-CM | POA: Diagnosis not present

## 2015-11-28 ENCOUNTER — Ambulatory Visit (HOSPITAL_COMMUNITY)
Admission: RE | Admit: 2015-11-28 | Discharge: 2015-11-28 | Disposition: A | Discharge status: Home / Self Care | Payer: Medicare Other | Source: Ambulatory Visit | Attending: Family Medicine | Admitting: Family Medicine

## 2015-11-28 DIAGNOSIS — Z1231 Encounter for screening mammogram for malignant neoplasm of breast: Secondary | ICD-10-CM | POA: Diagnosis not present

## 2015-12-03 ENCOUNTER — Other Ambulatory Visit (HOSPITAL_COMMUNITY): Payer: Self-pay | Admitting: Psychiatry

## 2015-12-10 DIAGNOSIS — M5136 Other intervertebral disc degeneration, lumbar region: Secondary | ICD-10-CM | POA: Diagnosis not present

## 2015-12-10 DIAGNOSIS — M47817 Spondylosis without myelopathy or radiculopathy, lumbosacral region: Secondary | ICD-10-CM | POA: Diagnosis not present

## 2016-01-02 DIAGNOSIS — M1712 Unilateral primary osteoarthritis, left knee: Secondary | ICD-10-CM | POA: Diagnosis not present

## 2016-01-02 DIAGNOSIS — M23204 Derangement of unspecified medial meniscus due to old tear or injury, left knee: Secondary | ICD-10-CM | POA: Diagnosis not present

## 2016-01-02 DIAGNOSIS — M23342 Other meniscus derangements, anterior horn of lateral meniscus, left knee: Secondary | ICD-10-CM | POA: Diagnosis not present

## 2016-01-02 DIAGNOSIS — M25562 Pain in left knee: Secondary | ICD-10-CM | POA: Diagnosis not present

## 2016-01-03 DIAGNOSIS — M1712 Unilateral primary osteoarthritis, left knee: Secondary | ICD-10-CM | POA: Diagnosis not present

## 2016-01-03 DIAGNOSIS — M25562 Pain in left knee: Secondary | ICD-10-CM | POA: Diagnosis not present

## 2016-01-03 DIAGNOSIS — M5416 Radiculopathy, lumbar region: Secondary | ICD-10-CM | POA: Diagnosis not present

## 2016-01-04 DIAGNOSIS — R300 Dysuria: Secondary | ICD-10-CM | POA: Diagnosis not present

## 2016-01-04 DIAGNOSIS — Z6832 Body mass index (BMI) 32.0-32.9, adult: Secondary | ICD-10-CM | POA: Diagnosis not present

## 2016-01-11 ENCOUNTER — Telehealth (HOSPITAL_COMMUNITY): Payer: Self-pay | Admitting: *Deleted

## 2016-01-11 NOTE — Telephone Encounter (Signed)
Pt was scheduled to f/u with provider on Jan 15, 2016 and provider will not be in office. Pt will be rescheduled for providers next available. Pt is in need for refills for her Xanax 1 mg 10-15-2015 60 tabs 2 refills, Cymbalta 60 mg 10-15-2015 60 tabs 2 refills, Ritalin 20 mg 08-20-2015 60 tabs 0 refills. Pt Pharmacy is [facility] [phone].

## 2016-01-12 ENCOUNTER — Other Ambulatory Visit (HOSPITAL_COMMUNITY): Payer: Self-pay | Admitting: Psychiatry

## 2016-01-12 MED ORDER — DULOXETINE HCL 60 MG PO CPEP: 60.0000 mg | ORAL_CAPSULE | Freq: Two times a day (BID) | ORAL | 2 refills | Status: DC

## 2016-01-12 NOTE — Telephone Encounter (Signed)
cymbalta sent in She no longer takes ritalin, but call in 30 d of xanax

## 2016-01-15 ENCOUNTER — Ambulatory Visit (HOSPITAL_COMMUNITY): Payer: Self-pay | Admitting: Psychiatry

## 2016-01-15 ENCOUNTER — Telehealth (HOSPITAL_COMMUNITY): Payer: Self-pay | Admitting: *Deleted

## 2016-01-15 MED ORDER — ALPRAZOLAM 1 MG PO TABS: 1.0000 mg | ORAL_TABLET | Freq: Two times a day (BID) | ORAL | 0 refills | Status: DC

## 2016-01-15 NOTE — Telephone Encounter (Signed)
Per Dr. Harrington Challenger to call in Xanax to pt pharmacy with 1 mo supply. Called pt pharmacy and spoke with Ovid Curd

## 2016-01-15 NOTE — Telephone Encounter (Signed)
Called in pt medication and spoke with Nathan 

## 2016-01-21 DIAGNOSIS — F329 Major depressive disorder, single episode, unspecified: Secondary | ICD-10-CM | POA: Diagnosis not present

## 2016-01-21 DIAGNOSIS — G8929 Other chronic pain: Secondary | ICD-10-CM | POA: Diagnosis not present

## 2016-01-21 DIAGNOSIS — M797 Fibromyalgia: Secondary | ICD-10-CM | POA: Diagnosis not present

## 2016-01-21 DIAGNOSIS — M47817 Spondylosis without myelopathy or radiculopathy, lumbosacral region: Secondary | ICD-10-CM | POA: Diagnosis not present

## 2016-01-21 DIAGNOSIS — M545 Low back pain: Secondary | ICD-10-CM | POA: Diagnosis not present

## 2016-01-21 DIAGNOSIS — Z79891 Long term (current) use of opiate analgesic: Secondary | ICD-10-CM | POA: Diagnosis not present

## 2016-01-21 DIAGNOSIS — M5136 Other intervertebral disc degeneration, lumbar region: Secondary | ICD-10-CM | POA: Diagnosis not present

## 2016-01-25 DIAGNOSIS — H04122 Dry eye syndrome of left lacrimal gland: Secondary | ICD-10-CM | POA: Diagnosis not present

## 2016-01-30 ENCOUNTER — Ambulatory Visit (HOSPITAL_COMMUNITY): Payer: Self-pay | Admitting: Psychiatry

## 2016-01-31 ENCOUNTER — Ambulatory Visit (HOSPITAL_COMMUNITY): Payer: Self-pay | Admitting: Psychiatry

## 2016-02-07 DIAGNOSIS — H02834 Dermatochalasis of left upper eyelid: Secondary | ICD-10-CM | POA: Diagnosis not present

## 2016-02-07 DIAGNOSIS — H02831 Dermatochalasis of right upper eyelid: Secondary | ICD-10-CM | POA: Diagnosis not present

## 2016-02-18 ENCOUNTER — Encounter (HOSPITAL_COMMUNITY): Payer: Self-pay | Admitting: Psychiatry

## 2016-02-18 ENCOUNTER — Ambulatory Visit (INDEPENDENT_AMBULATORY_CARE_PROVIDER_SITE_OTHER): Payer: Medicare Other | Admitting: Psychiatry

## 2016-02-18 VITALS — BP 127/76 | HR 55 | Ht 65.5 in | Wt 196.2 lb

## 2016-02-18 DIAGNOSIS — Z801 Family history of malignant neoplasm of trachea, bronchus and lung: Secondary | ICD-10-CM

## 2016-02-18 DIAGNOSIS — F411 Generalized anxiety disorder: Secondary | ICD-10-CM

## 2016-02-18 DIAGNOSIS — F331 Major depressive disorder, recurrent, moderate: Secondary | ICD-10-CM | POA: Diagnosis not present

## 2016-02-18 DIAGNOSIS — Z79899 Other long term (current) drug therapy: Secondary | ICD-10-CM

## 2016-02-18 DIAGNOSIS — Z813 Family history of other psychoactive substance abuse and dependence: Secondary | ICD-10-CM

## 2016-02-18 DIAGNOSIS — Z811 Family history of alcohol abuse and dependence: Secondary | ICD-10-CM

## 2016-02-18 DIAGNOSIS — Z8249 Family history of ischemic heart disease and other diseases of the circulatory system: Secondary | ICD-10-CM | POA: Diagnosis not present

## 2016-02-18 MED ORDER — METHYLPHENIDATE HCL 20 MG PO TABS: 20.0000 mg | ORAL_TABLET | Freq: Two times a day (BID) | ORAL | 0 refills | Status: DC

## 2016-02-18 MED ORDER — DULOXETINE HCL 60 MG PO CPEP: 60.0000 mg | ORAL_CAPSULE | Freq: Two times a day (BID) | ORAL | 2 refills | Status: DC

## 2016-02-18 MED ORDER — ALPRAZOLAM 1 MG PO TABS: 1.0000 mg | ORAL_TABLET | Freq: Two times a day (BID) | ORAL | 2 refills | Status: DC
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
## 2016-02-25 DIAGNOSIS — M5136 Other intervertebral disc degeneration, lumbar region: Secondary | ICD-10-CM | POA: Diagnosis not present

## 2016-02-25 DIAGNOSIS — M129 Arthropathy, unspecified: Secondary | ICD-10-CM | POA: Diagnosis not present

## 2016-02-25 DIAGNOSIS — G894 Chronic pain syndrome: Secondary | ICD-10-CM | POA: Diagnosis not present

## 2016-02-25 DIAGNOSIS — M797 Fibromyalgia: Secondary | ICD-10-CM | POA: Diagnosis not present

## 2016-02-25 DIAGNOSIS — M47817 Spondylosis without myelopathy or radiculopathy, lumbosacral region: Secondary | ICD-10-CM | POA: Diagnosis not present

## 2016-02-25 DIAGNOSIS — M1990 Unspecified osteoarthritis, unspecified site: Secondary | ICD-10-CM | POA: Diagnosis not present

## 2016-02-26 DIAGNOSIS — H04123 Dry eye syndrome of bilateral lacrimal glands: Secondary | ICD-10-CM | POA: Diagnosis not present

## 2016-03-11 ENCOUNTER — Ambulatory Visit (INDEPENDENT_AMBULATORY_CARE_PROVIDER_SITE_OTHER): Payer: Self-pay | Admitting: Internal Medicine

## 2016-03-12 DIAGNOSIS — H02834 Dermatochalasis of left upper eyelid: Secondary | ICD-10-CM | POA: Diagnosis not present

## 2016-03-12 DIAGNOSIS — H02831 Dermatochalasis of right upper eyelid: Secondary | ICD-10-CM | POA: Diagnosis not present

## 2016-03-24 DIAGNOSIS — M545 Low back pain: Secondary | ICD-10-CM | POA: Diagnosis not present

## 2016-03-24 DIAGNOSIS — M47817 Spondylosis without myelopathy or radiculopathy, lumbosacral region: Secondary | ICD-10-CM | POA: Diagnosis not present

## 2016-04-21 DIAGNOSIS — R5383 Other fatigue: Secondary | ICD-10-CM | POA: Diagnosis not present

## 2016-04-21 DIAGNOSIS — F3289 Other specified depressive episodes: Secondary | ICD-10-CM | POA: Diagnosis not present

## 2016-04-21 DIAGNOSIS — F330 Major depressive disorder, recurrent, mild: Secondary | ICD-10-CM | POA: Diagnosis not present

## 2016-04-21 DIAGNOSIS — I10 Essential (primary) hypertension: Secondary | ICD-10-CM | POA: Diagnosis not present

## 2016-04-21 DIAGNOSIS — E782 Mixed hyperlipidemia: Secondary | ICD-10-CM | POA: Diagnosis not present

## 2016-04-21 DIAGNOSIS — R203 Hyperesthesia: Secondary | ICD-10-CM | POA: Diagnosis not present

## 2016-04-22 DIAGNOSIS — M797 Fibromyalgia: Secondary | ICD-10-CM | POA: Diagnosis not present

## 2016-04-22 DIAGNOSIS — M47817 Spondylosis without myelopathy or radiculopathy, lumbosacral region: Secondary | ICD-10-CM | POA: Diagnosis not present

## 2016-04-22 DIAGNOSIS — M1712 Unilateral primary osteoarthritis, left knee: Secondary | ICD-10-CM | POA: Diagnosis not present

## 2016-04-22 DIAGNOSIS — G5603 Carpal tunnel syndrome, bilateral upper limbs: Secondary | ICD-10-CM | POA: Diagnosis not present

## 2016-04-22 DIAGNOSIS — G629 Polyneuropathy, unspecified: Secondary | ICD-10-CM | POA: Diagnosis not present

## 2016-04-22 DIAGNOSIS — M545 Low back pain: Secondary | ICD-10-CM | POA: Diagnosis not present

## 2016-04-22 DIAGNOSIS — G894 Chronic pain syndrome: Secondary | ICD-10-CM | POA: Diagnosis not present

## 2016-04-28 DIAGNOSIS — I10 Essential (primary) hypertension: Secondary | ICD-10-CM | POA: Diagnosis not present

## 2016-04-28 DIAGNOSIS — F330 Major depressive disorder, recurrent, mild: Secondary | ICD-10-CM | POA: Diagnosis not present

## 2016-04-28 DIAGNOSIS — Z0001 Encounter for general adult medical examination with abnormal findings: Secondary | ICD-10-CM | POA: Diagnosis not present

## 2016-04-28 DIAGNOSIS — Z6832 Body mass index (BMI) 32.0-32.9, adult: Secondary | ICD-10-CM | POA: Diagnosis not present

## 2016-04-28 DIAGNOSIS — M810 Age-related osteoporosis without current pathological fracture: Secondary | ICD-10-CM | POA: Insufficient documentation

## 2016-04-28 DIAGNOSIS — H8110 Benign paroxysmal vertigo, unspecified ear: Secondary | ICD-10-CM | POA: Diagnosis not present

## 2016-04-29 DIAGNOSIS — M5417 Radiculopathy, lumbosacral region: Secondary | ICD-10-CM | POA: Diagnosis not present

## 2016-05-13 DIAGNOSIS — G5600 Carpal tunnel syndrome, unspecified upper limb: Secondary | ICD-10-CM | POA: Diagnosis not present

## 2016-05-28 DIAGNOSIS — G8929 Other chronic pain: Secondary | ICD-10-CM | POA: Diagnosis not present

## 2016-05-28 DIAGNOSIS — M47817 Spondylosis without myelopathy or radiculopathy, lumbosacral region: Secondary | ICD-10-CM | POA: Diagnosis not present

## 2016-05-28 DIAGNOSIS — M797 Fibromyalgia: Secondary | ICD-10-CM | POA: Diagnosis not present

## 2016-06-17 DIAGNOSIS — M797 Fibromyalgia: Secondary | ICD-10-CM | POA: Diagnosis not present

## 2016-06-17 DIAGNOSIS — M1712 Unilateral primary osteoarthritis, left knee: Secondary | ICD-10-CM | POA: Diagnosis not present

## 2016-06-17 DIAGNOSIS — M5136 Other intervertebral disc degeneration, lumbar region: Secondary | ICD-10-CM | POA: Diagnosis not present

## 2016-06-17 DIAGNOSIS — Z79891 Long term (current) use of opiate analgesic: Secondary | ICD-10-CM | POA: Diagnosis not present

## 2016-06-17 DIAGNOSIS — M47817 Spondylosis without myelopathy or radiculopathy, lumbosacral region: Secondary | ICD-10-CM | POA: Diagnosis not present

## 2016-06-17 DIAGNOSIS — G894 Chronic pain syndrome: Secondary | ICD-10-CM | POA: Diagnosis not present

## 2016-07-15 DIAGNOSIS — M1712 Unilateral primary osteoarthritis, left knee: Secondary | ICD-10-CM | POA: Diagnosis not present

## 2016-07-15 DIAGNOSIS — M5410 Radiculopathy, site unspecified: Secondary | ICD-10-CM | POA: Diagnosis not present

## 2016-07-15 DIAGNOSIS — M25562 Pain in left knee: Secondary | ICD-10-CM | POA: Diagnosis not present

## 2016-07-30 ENCOUNTER — Other Ambulatory Visit (HOSPITAL_COMMUNITY): Payer: Self-pay | Admitting: Psychiatry

## 2016-08-01 ENCOUNTER — Encounter (HOSPITAL_COMMUNITY): Payer: Self-pay | Admitting: Psychiatry

## 2016-08-01 ENCOUNTER — Ambulatory Visit (INDEPENDENT_AMBULATORY_CARE_PROVIDER_SITE_OTHER): Payer: Medicare Other | Admitting: Psychiatry

## 2016-08-01 VITALS — BP 130/53 | HR 61 | Ht 65.5 in | Wt 197.2 lb

## 2016-08-01 DIAGNOSIS — Z811 Family history of alcohol abuse and dependence: Secondary | ICD-10-CM

## 2016-08-01 DIAGNOSIS — F331 Major depressive disorder, recurrent, moderate: Secondary | ICD-10-CM | POA: Diagnosis not present

## 2016-08-01 DIAGNOSIS — Z813 Family history of other psychoactive substance abuse and dependence: Secondary | ICD-10-CM

## 2016-08-01 MED ORDER — METHYLPHENIDATE HCL 20 MG PO TABS: 20.0000 mg | ORAL_TABLET | Freq: Two times a day (BID) | ORAL | 0 refills | Status: DC

## 2016-08-01 MED ORDER — DULOXETINE HCL 60 MG PO CPEP: 60.0000 mg | ORAL_CAPSULE | Freq: Two times a day (BID) | ORAL | 2 refills | Status: AC

## 2016-08-01 MED ORDER — ALPRAZOLAM 1 MG PO TABS: 1.0000 mg | ORAL_TABLET | Freq: Two times a day (BID) | ORAL | 2 refills | Status: AC
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
## 2016-08-04 ENCOUNTER — Other Ambulatory Visit: Payer: Self-pay

## 2016-09-29 ENCOUNTER — Ambulatory Visit (HOSPITAL_COMMUNITY): Payer: Self-pay | Admitting: Psychiatry

## 2016-10-07 ENCOUNTER — Ambulatory Visit (HOSPITAL_COMMUNITY): Payer: Medicare Other | Admitting: Psychiatry

## 2016-10-12 DIAGNOSIS — R0602 Shortness of breath: Secondary | ICD-10-CM | POA: Diagnosis not present

## 2016-10-12 DIAGNOSIS — Z836 Family history of other diseases of the respiratory system: Secondary | ICD-10-CM | POA: Diagnosis not present

## 2016-10-12 DIAGNOSIS — I10 Essential (primary) hypertension: Secondary | ICD-10-CM | POA: Diagnosis not present

## 2016-10-12 DIAGNOSIS — Z79899 Other long term (current) drug therapy: Secondary | ICD-10-CM | POA: Diagnosis not present

## 2016-10-12 DIAGNOSIS — F329 Major depressive disorder, single episode, unspecified: Secondary | ICD-10-CM | POA: Diagnosis not present

## 2016-10-12 DIAGNOSIS — Z90710 Acquired absence of both cervix and uterus: Secondary | ICD-10-CM | POA: Diagnosis not present

## 2016-10-12 DIAGNOSIS — Z87440 Personal history of urinary (tract) infections: Secondary | ICD-10-CM | POA: Diagnosis not present

## 2016-10-12 DIAGNOSIS — M797 Fibromyalgia: Secondary | ICD-10-CM | POA: Diagnosis not present

## 2016-10-12 DIAGNOSIS — R6884 Jaw pain: Secondary | ICD-10-CM | POA: Diagnosis not present

## 2016-10-12 DIAGNOSIS — Z8249 Family history of ischemic heart disease and other diseases of the circulatory system: Secondary | ICD-10-CM | POA: Diagnosis not present

## 2016-10-12 DIAGNOSIS — R51 Headache: Secondary | ICD-10-CM | POA: Diagnosis not present

## 2016-10-12 DIAGNOSIS — Z7951 Long term (current) use of inhaled steroids: Secondary | ICD-10-CM | POA: Diagnosis not present

## 2016-10-12 DIAGNOSIS — R079 Chest pain, unspecified: Secondary | ICD-10-CM | POA: Diagnosis not present

## 2016-10-12 DIAGNOSIS — R0789 Other chest pain: Secondary | ICD-10-CM | POA: Diagnosis not present

## 2016-10-12 DIAGNOSIS — F419 Anxiety disorder, unspecified: Secondary | ICD-10-CM | POA: Diagnosis not present

## 2016-10-12 DIAGNOSIS — G629 Polyneuropathy, unspecified: Secondary | ICD-10-CM | POA: Diagnosis not present

## 2016-10-12 DIAGNOSIS — R531 Weakness: Secondary | ICD-10-CM | POA: Diagnosis not present

## 2016-10-12 DIAGNOSIS — G4730 Sleep apnea, unspecified: Secondary | ICD-10-CM | POA: Diagnosis not present

## 2016-10-13 DIAGNOSIS — Z87440 Personal history of urinary (tract) infections: Secondary | ICD-10-CM | POA: Diagnosis not present

## 2016-10-13 DIAGNOSIS — I10 Essential (primary) hypertension: Secondary | ICD-10-CM | POA: Diagnosis not present

## 2016-10-13 DIAGNOSIS — G629 Polyneuropathy, unspecified: Secondary | ICD-10-CM | POA: Diagnosis not present

## 2016-10-13 DIAGNOSIS — R079 Chest pain, unspecified: Secondary | ICD-10-CM | POA: Diagnosis not present

## 2016-10-22 DIAGNOSIS — M797 Fibromyalgia: Secondary | ICD-10-CM | POA: Diagnosis not present

## 2016-10-22 DIAGNOSIS — R05 Cough: Secondary | ICD-10-CM | POA: Diagnosis not present

## 2016-10-22 DIAGNOSIS — R0600 Dyspnea, unspecified: Secondary | ICD-10-CM | POA: Diagnosis not present

## 2016-10-22 DIAGNOSIS — Z6832 Body mass index (BMI) 32.0-32.9, adult: Secondary | ICD-10-CM | POA: Diagnosis not present

## 2016-10-22 DIAGNOSIS — F330 Major depressive disorder, recurrent, mild: Secondary | ICD-10-CM | POA: Diagnosis not present

## 2016-10-24 ENCOUNTER — Other Ambulatory Visit (HOSPITAL_COMMUNITY): Payer: Self-pay | Admitting: Family Medicine

## 2016-10-24 DIAGNOSIS — Z1231 Encounter for screening mammogram for malignant neoplasm of breast: Secondary | ICD-10-CM

## 2016-11-11 ENCOUNTER — Other Ambulatory Visit (HOSPITAL_COMMUNITY): Payer: Self-pay | Admitting: Psychiatry

## 2016-11-29 DIAGNOSIS — R300 Dysuria: Secondary | ICD-10-CM | POA: Diagnosis not present

## 2016-11-29 DIAGNOSIS — Z6833 Body mass index (BMI) 33.0-33.9, adult: Secondary | ICD-10-CM | POA: Diagnosis not present

## 2017-01-16 ENCOUNTER — Other Ambulatory Visit (HOSPITAL_COMMUNITY): Payer: Self-pay | Admitting: Family Medicine

## 2017-01-16 DIAGNOSIS — R229 Localized swelling, mass and lump, unspecified: Principal | ICD-10-CM

## 2017-01-16 DIAGNOSIS — IMO0002 Reserved for concepts with insufficient information to code with codable children: Secondary | ICD-10-CM

## 2017-01-20 DIAGNOSIS — M25562 Pain in left knee: Secondary | ICD-10-CM | POA: Diagnosis not present

## 2017-01-20 DIAGNOSIS — M5441 Lumbago with sciatica, right side: Secondary | ICD-10-CM | POA: Diagnosis not present

## 2017-01-27 ENCOUNTER — Encounter (HOSPITAL_COMMUNITY): Payer: Self-pay

## 2017-01-27 ENCOUNTER — Ambulatory Visit (HOSPITAL_COMMUNITY)
Admission: RE | Admit: 2017-01-27 | Discharge: 2017-01-27 | Disposition: A | Discharge status: Home / Self Care | Payer: Medicare Other | Source: Ambulatory Visit | Attending: Family Medicine | Admitting: Family Medicine

## 2017-01-27 DIAGNOSIS — N6321 Unspecified lump in the left breast, upper outer quadrant: Secondary | ICD-10-CM | POA: Diagnosis not present

## 2017-01-27 DIAGNOSIS — R229 Localized swelling, mass and lump, unspecified: Principal | ICD-10-CM

## 2017-01-27 DIAGNOSIS — N6320 Unspecified lump in the left breast, unspecified quadrant: Secondary | ICD-10-CM | POA: Diagnosis present

## 2017-01-27 DIAGNOSIS — R928 Other abnormal and inconclusive findings on diagnostic imaging of breast: Secondary | ICD-10-CM | POA: Diagnosis not present

## 2017-01-27 DIAGNOSIS — IMO0002 Reserved for concepts with insufficient information to code with codable children: Secondary | ICD-10-CM

## 2017-01-29 ENCOUNTER — Other Ambulatory Visit (HOSPITAL_COMMUNITY): Payer: Self-pay | Admitting: Family Medicine

## 2017-01-29 DIAGNOSIS — M25562 Pain in left knee: Secondary | ICD-10-CM | POA: Diagnosis not present

## 2017-01-29 DIAGNOSIS — N6320 Unspecified lump in the left breast, unspecified quadrant: Secondary | ICD-10-CM

## 2017-02-03 DIAGNOSIS — M5442 Lumbago with sciatica, left side: Secondary | ICD-10-CM | POA: Diagnosis not present

## 2017-02-03 DIAGNOSIS — M25571 Pain in right ankle and joints of right foot: Secondary | ICD-10-CM | POA: Diagnosis not present

## 2017-02-03 DIAGNOSIS — M545 Low back pain: Secondary | ICD-10-CM | POA: Diagnosis not present

## 2017-02-03 DIAGNOSIS — M5441 Lumbago with sciatica, right side: Secondary | ICD-10-CM | POA: Diagnosis not present

## 2017-02-05 DIAGNOSIS — M25562 Pain in left knee: Secondary | ICD-10-CM | POA: Diagnosis not present

## 2017-02-09 DIAGNOSIS — M48062 Spinal stenosis, lumbar region with neurogenic claudication: Secondary | ICD-10-CM | POA: Diagnosis not present

## 2017-02-09 DIAGNOSIS — M1712 Unilateral primary osteoarthritis, left knee: Secondary | ICD-10-CM | POA: Diagnosis not present

## 2017-02-10 ENCOUNTER — Ambulatory Visit (HOSPITAL_COMMUNITY)
Admission: RE | Admit: 2017-02-10 | Discharge: 2017-02-10 | Disposition: A | Discharge status: Home / Self Care | Payer: Medicare Other | Source: Ambulatory Visit | Attending: Family Medicine | Admitting: Family Medicine

## 2017-02-10 ENCOUNTER — Other Ambulatory Visit (HOSPITAL_COMMUNITY): Payer: Self-pay | Admitting: Family Medicine

## 2017-02-10 DIAGNOSIS — R928 Other abnormal and inconclusive findings on diagnostic imaging of breast: Secondary | ICD-10-CM

## 2017-02-10 DIAGNOSIS — N6321 Unspecified lump in the left breast, upper outer quadrant: Secondary | ICD-10-CM | POA: Diagnosis not present

## 2017-02-10 DIAGNOSIS — N6012 Diffuse cystic mastopathy of left breast: Secondary | ICD-10-CM | POA: Insufficient documentation

## 2017-02-10 DIAGNOSIS — N6320 Unspecified lump in the left breast, unspecified quadrant: Secondary | ICD-10-CM

## 2017-02-10 MED ORDER — LIDOCAINE-EPINEPHRINE (PF) 1 %-1:200000 IJ SOLN
INTRAMUSCULAR | Status: AC
  Administered 2017-02-10: 7 mL
  Filled 2017-02-10: qty 30

## 2017-02-10 MED ORDER — LIDOCAINE HCL (PF) 1 % IJ SOLN
INTRAMUSCULAR | Status: AC
  Administered 2017-02-10: 5 mL
  Filled 2017-02-10: qty 10

## 2017-02-13 DIAGNOSIS — M6752 Plica syndrome, left knee: Secondary | ICD-10-CM | POA: Diagnosis not present

## 2017-02-13 DIAGNOSIS — G8918 Other acute postprocedural pain: Secondary | ICD-10-CM | POA: Diagnosis not present

## 2017-02-13 DIAGNOSIS — M23242 Derangement of anterior horn of lateral meniscus due to old tear or injury, left knee: Secondary | ICD-10-CM | POA: Diagnosis not present

## 2017-02-13 DIAGNOSIS — M23222 Derangement of posterior horn of medial meniscus due to old tear or injury, left knee: Secondary | ICD-10-CM | POA: Diagnosis not present

## 2017-02-13 DIAGNOSIS — M94262 Chondromalacia, left knee: Secondary | ICD-10-CM | POA: Diagnosis not present

## 2017-02-19 DIAGNOSIS — M1712 Unilateral primary osteoarthritis, left knee: Secondary | ICD-10-CM | POA: Diagnosis not present

## 2017-02-23 DIAGNOSIS — M5416 Radiculopathy, lumbar region: Secondary | ICD-10-CM | POA: Diagnosis not present

## 2017-03-03 DIAGNOSIS — M5416 Radiculopathy, lumbar region: Secondary | ICD-10-CM | POA: Diagnosis not present

## 2017-03-09 DIAGNOSIS — L821 Other seborrheic keratosis: Secondary | ICD-10-CM | POA: Diagnosis not present

## 2017-03-09 DIAGNOSIS — B079 Viral wart, unspecified: Secondary | ICD-10-CM | POA: Diagnosis not present

## 2017-03-09 DIAGNOSIS — D2262 Melanocytic nevi of left upper limb, including shoulder: Secondary | ICD-10-CM | POA: Diagnosis not present

## 2017-03-09 DIAGNOSIS — L570 Actinic keratosis: Secondary | ICD-10-CM | POA: Diagnosis not present

## 2017-03-09 DIAGNOSIS — D485 Neoplasm of uncertain behavior of skin: Secondary | ICD-10-CM | POA: Diagnosis not present

## 2017-03-09 DIAGNOSIS — L820 Inflamed seborrheic keratosis: Secondary | ICD-10-CM | POA: Diagnosis not present

## 2017-03-10 DIAGNOSIS — M25562 Pain in left knee: Secondary | ICD-10-CM | POA: Diagnosis not present

## 2017-03-10 DIAGNOSIS — M25462 Effusion, left knee: Secondary | ICD-10-CM | POA: Diagnosis not present

## 2017-03-11 DIAGNOSIS — M1712 Unilateral primary osteoarthritis, left knee: Secondary | ICD-10-CM | POA: Diagnosis not present

## 2017-03-11 DIAGNOSIS — R262 Difficulty in walking, not elsewhere classified: Secondary | ICD-10-CM | POA: Diagnosis not present

## 2017-03-11 DIAGNOSIS — Z9889 Other specified postprocedural states: Secondary | ICD-10-CM | POA: Diagnosis not present

## 2017-03-12 ENCOUNTER — Encounter (HOSPITAL_COMMUNITY): Payer: Self-pay | Admitting: *Deleted

## 2017-03-12 ENCOUNTER — Other Ambulatory Visit: Payer: Self-pay

## 2017-03-12 ENCOUNTER — Emergency Department (HOSPITAL_COMMUNITY): Payer: Medicare Other

## 2017-03-12 ENCOUNTER — Emergency Department (HOSPITAL_COMMUNITY)
Admission: EM | Admit: 2017-03-12 | Discharge: 2017-03-13 | Disposition: A | Discharge status: Home / Self Care | Payer: Medicare Other | Attending: Emergency Medicine | Admitting: Emergency Medicine

## 2017-03-12 DIAGNOSIS — I10 Essential (primary) hypertension: Secondary | ICD-10-CM | POA: Insufficient documentation

## 2017-03-12 DIAGNOSIS — J3489 Other specified disorders of nose and nasal sinuses: Secondary | ICD-10-CM | POA: Diagnosis not present

## 2017-03-12 DIAGNOSIS — Z79899 Other long term (current) drug therapy: Secondary | ICD-10-CM | POA: Diagnosis not present

## 2017-03-12 DIAGNOSIS — R0602 Shortness of breath: Secondary | ICD-10-CM | POA: Diagnosis not present

## 2017-03-12 DIAGNOSIS — B9789 Other viral agents as the cause of diseases classified elsewhere: Secondary | ICD-10-CM

## 2017-03-12 DIAGNOSIS — R05 Cough: Secondary | ICD-10-CM | POA: Diagnosis not present

## 2017-03-12 DIAGNOSIS — J069 Acute upper respiratory infection, unspecified: Secondary | ICD-10-CM | POA: Insufficient documentation

## 2017-03-12 MED ORDER — BENZONATATE 100 MG PO CAPS: 200.0000 mg | ORAL_CAPSULE | Freq: Three times a day (TID) | ORAL | 0 refills | Status: DC | PRN

## 2017-03-12 MED ORDER — ALBUTEROL SULFATE HFA 108 (90 BASE) MCG/ACT IN AERS: 1.0000 | INHALATION_SPRAY | Freq: Four times a day (QID) | RESPIRATORY_TRACT | 0 refills | Status: DC | PRN

## 2017-03-12 MED ORDER — BENZONATATE 100 MG PO CAPS
200.0000 mg | ORAL_CAPSULE | Freq: Once | ORAL | Status: AC
  Administered 2017-03-12: 200 mg via ORAL
  Filled 2017-03-12: qty 2

## 2017-03-12 NOTE — ED Notes (Signed)
ED Provider at bedside. 

## 2017-03-12 NOTE — ED Provider Notes (Signed)
[facility] EMERGENCY DEPARTMENT Provider Note   CSN: [id number] Arrival date & time: 03/12/17  2056     History   Chief Complaint Chief Complaint  Patient presents with  . Cough    HPI [name] is a 67 y.o. female .  The history is provided by the patient and the spouse.  Cough  This is a new problem. The current episode started more than 2 days ago. The problem occurs every few minutes. The problem has not changed since onset.The cough is non-productive. There has been no fever. Associated symptoms include rhinorrhea. Pertinent negatives include no chest pain, no chills, no sweats, no ear pain, no sore throat, no shortness of breath and no wheezing. Associated symptoms comments: Hoarse voice with c/o croupy sounding cough. She has tried cough syrup for the symptoms. The treatment provided no relief. She is not a smoker. Her past medical history does not include bronchitis, pneumonia or COPD.    Past Medical History:  Diagnosis Date  . Anxiety   . Back pain   . Cystitides, interstitial, chronic   . Depression   . Fibromyalgia   . GERD (gastroesophageal reflux disease)   . High cholesterol   . Hypertension   . Shortness of breath   . Sleep apnea   . Urinary disorder    bladder is in sling per pt  . Urinary tract infection   . Vertigo     Patient Active Problem List   Diagnosis Date Noted  . Vasomotor instability 02/26/2015  . Dizziness and giddiness 08/14/2014  . Gait difficulty 08/14/2014  . Depression 03/01/2014    Past Surgical History:  Procedure Laterality Date  . ABDOMINAL HYSTERECTOMY  1997   [place] , [place]    . bladder tack    . COLONOSCOPY N/A 08/23/2014   Procedure: COLONOSCOPY;  Surgeon: Rogene Houston, MD;  Location: [facility] ENDO SUITE;  Service: Endoscopy;  Laterality: N/A;  1200  . KNEE SURGERY Left 05/26/14   meniscus tear  . LAPAROSCOPIC SALPINGO OOPHERECTOMY  02/17/2012   Procedure: LAPAROSCOPIC SALPINGO  OOPHORECTOMY;  Surgeon: Jonnie Kind, MD;  Location: [facility] ORS;  Service: Gynecology;  Laterality: Bilateral;  . LASIK    . TOE SURGERY      OB History    No data available       Home Medications    Prior to Admission medications   Medication Sig Start Date End Date Taking? Authorizing Provider  acetaminophen (TYLENOL) 500 MG tablet Take 500 mg by mouth as needed.    [provider]  ALPRAZolam Duanne Moron) 1 MG tablet Take 1 tablet (1 mg total) by mouth 2 (two) times daily. 08/01/16 08/01/17  Cloria Spring, MD  amLODipine (NORVASC) 5 MG tablet Take 5 mg by mouth daily.    [provider]  benzonatate (TESSALON) 100 MG capsule Take 2 capsules (200 mg total) by mouth 3 (three) times daily as needed. 03/12/17   Evalee Jefferson, PA-C  bisacodyl (DULCOLAX) 10 MG suppository Place 1 suppository (10 mg total) rectally daily as needed for moderate constipation. 09/11/15   Rogene Houston, MD  cetirizine (ZYRTEC) 10 MG tablet Take 10 mg by mouth daily as needed. Allergies    [provider]  DimenhyDRINATE (DRAMAMINE PO) Take 1 tablet by mouth as needed.     [provider]  DULoxetine (CYMBALTA) 60 MG capsule Take 1 capsule (60 mg total) by mouth 2 (two) times daily. 08/01/16  Cloria Spring, MD  estradiol (ESTRACE) 1 MG tablet TAKE ONE TABLET BY MOUTH DAILY. Patient not taking: Reported on 08/01/2016 09/10/15   Jonnie Kind, MD  FIBER SELECT GUMMIES PO Take 4 g by mouth daily. Forensic psychologist     [provider]  fluticasone (FLONASE) 50 MCG/ACT nasal spray Place 1 spray into both nostrils daily.     [provider]  gabapentin (NEURONTIN) 300 MG capsule Take 300 mg by mouth 2 (two) times daily.     [provider]  lisinopril-hydrochlorothiazide (PRINZIDE,ZESTORETIC) 20-12.5 MG per tablet Take 1 tablet by mouth 2 (two) times daily.    [provider]  loperamide (IMODIUM) 2 MG capsule Take 1 capsule (2 mg total) by mouth daily  before breakfast. 05/08/15   Rehman, Mechele Dawley, MD  meclizine (ANTIVERT) 25 MG tablet Take 25 mg by mouth every 6 (six) hours as needed for dizziness.    [provider]  Methenamine-Sodium Salicylate (CYSTEX) 509-326.7 MG TABS Take 2 tablets by mouth daily as needed. For urinary pain    [provider]  methylphenidate (RITALIN) 20 MG tablet Take 1 tablet (20 mg total) by mouth 2 (two) times daily with breakfast and lunch. 08/01/16 08/01/17  Cloria Spring, MD  methylphenidate (RITALIN) 20 MG tablet Take 1 tablet (20 mg total) by mouth 2 (two) times daily with breakfast and lunch. 08/01/16 08/01/17  Cloria Spring, MD  metoprolol (LOPRESSOR) 100 MG tablet Take 100 mg by mouth 2 (two) times daily.     [provider]  ondansetron (ZOFRAN) 4 MG tablet Take 1 tablet (4 mg total) by mouth 2 (two) times daily as needed for nausea or vomiting. Patient taking differently: Take 4 mg by mouth as needed for nausea or vomiting.  08/25/14   Rogene Houston, MD  Probiotic Product (PROBIOTIC DAILY PO) Take by mouth daily. Patient states that she takes 2-4 per day.    [provider]  simvastatin (ZOCOR) 20 MG tablet Take 10 mg by mouth every evening.     [provider]  traMADol (ULTRAM) 50 MG tablet Take 50 mg by mouth 3 (three) times daily as needed.     [provider]    Family History Family History  Problem Relation Age of Onset  . Atrial fibrillation Mother   . COPD Mother   . Hypertension Mother   . Hypertension Father   . Cancer Father        throat   . Drug abuse Brother   . Alcohol abuse Brother   . Early death Brother   . Fibromyalgia Daughter   . Paranoid behavior Son   . Alcohol abuse Son   . Hypertension Brother   . Sleep apnea Brother   . Alcohol abuse Brother   . Drug abuse Brother     Social History Social History   Tobacco Use  . Smoking status: Never Smoker  . Smokeless tobacco: Never Used  Substance Use Topics  .  Alcohol use: No    Alcohol/week: 0.0 oz    Comment: occassionalym, pt denies any use   . Drug use: No     Allergies   Patient has no known allergies.   Review of Systems Review of Systems  Constitutional: Negative for chills and fever.  HENT: Positive for rhinorrhea and voice change. Negative for congestion, ear pain, sinus pressure, sore throat and trouble swallowing.   Eyes: Negative for discharge.  Respiratory: Positive for cough.  Negative for shortness of breath, wheezing and stridor.   Cardiovascular: Negative.  Negative for chest pain.  Gastrointestinal: Negative.  Negative for abdominal pain.  Genitourinary: Negative.   Musculoskeletal: Negative.      Physical Exam Updated Vital Signs BP 136/67 (BP Location: Right Arm)   Pulse 69   Temp 98.4 F (36.9 C) (Oral)   Resp 18   Ht 5' 5.5" (1.664 m)   Wt 90.7 kg (200 lb)   SpO2 100%   BMI 32.78 kg/m   Physical Exam  Constitutional: She is oriented to person, place, and time. She appears well-developed and well-nourished.  HENT:  Head: Normocephalic and atraumatic.  Right Ear: Tympanic membrane and ear canal normal.  Left Ear: Tympanic membrane and ear canal normal.  Nose: Mucosal edema and rhinorrhea present.  Mouth/Throat: Uvula is midline, oropharynx is clear and moist and mucous membranes are normal. No oropharyngeal exudate, posterior oropharyngeal edema, posterior oropharyngeal erythema or tonsillar abscesses.  Eyes: Conjunctivae are normal.  Cardiovascular: Normal rate and normal heart sounds.  Pulmonary/Chest: Effort normal. No respiratory distress. She has no wheezes. She has no rales.  Abdominal: Soft. There is no tenderness.  Musculoskeletal: Normal range of motion.  Neurological: She is alert and oriented to person, place, and time.  Skin: Skin is warm and dry. No rash noted.  Psychiatric: She has a normal mood and affect.     ED Treatments / Results  Labs (all labs ordered are listed, but only  abnormal results are displayed) Labs Reviewed - No data to display  EKG  EKG Interpretation None       Radiology Dg Chest 2 View  Result Date: 03/12/2017 CLINICAL DATA:  Cough and shortness of breath. Status post sternal biopsy. EXAM: CHEST  2 VIEW COMPARISON:  Chest radiograph October 12, 2016 FINDINGS: Cardiac silhouette is mildly enlarged. Mediastinal silhouette is nonsuspicious. Mild bronchitic changes without pleural effusion or focal consolidation. No pneumothorax. Mild degenerative change of the thoracic spine. Advanced cervical facet arthropathy. Soft tissue planes are nonsuspicious. IMPRESSION: Mild cardiomegaly and mild bronchitic changes. Electronically Signed   By: Elon Alas M.D.   On: 03/12/2017 21:57    Procedures Procedures (including critical care time)  Medications Ordered in ED Medications  benzonatate (TESSALON) capsule 200 mg (200 mg Oral Given 03/12/17 2249)     Initial Impression / Assessment and Plan / ED Course  I have reviewed the triage vital signs and the nursing notes.  Pertinent labs & imaging results that were available during my care of the patient were reviewed by me and considered in my medical decision making (see chart for details).     Pt with no concerning findings on exam, cxr reviewed and negative for pneumonia.  No respiratory distress. She was offered multiple choices for treatment of her cough. She was resistent to numerous options as she "does not like to take medicine". Agreed to try tessalon perles.   Pt was seen by Dr. Rogene Houston prior to dc home.  Final Clinical Impressions(s) / ED Diagnoses   Final diagnoses:  Viral URI with cough    ED Discharge Orders        Ordered    benzonatate (TESSALON) 100 MG capsule  3 times daily PRN     03/12/17 2330       Evalee Jefferson, PA-C 03/12/17 2344    Fredia Sorrow, MD 03/13/17 [id number]

## 2017-03-12 NOTE — Discharge Instructions (Signed)
Rest and make sure you are drinking plenty of fluids.  You may take the tessalon prescribed for cough suppression.  Also, you may want to continue warm tea with honey, cough lozenges and benadryl (will help you sleep and will also help with your nasal drainage and post nasal drip (which often is a trigger for coughing). Your chest xray is negative for infection (pneumonia).

## 2017-03-12 NOTE — ED Notes (Signed)
Patient transported to X-ray 

## 2017-03-12 NOTE — ED Triage Notes (Signed)
Pt c/o cough x 4 days ago

## 2017-03-12 NOTE — ED Notes (Signed)
Pt admits to sputum changed color from white to yellow starting today.

## 2017-03-15 DIAGNOSIS — F329 Major depressive disorder, single episode, unspecified: Secondary | ICD-10-CM | POA: Diagnosis not present

## 2017-03-15 DIAGNOSIS — Z79899 Other long term (current) drug therapy: Secondary | ICD-10-CM | POA: Diagnosis not present

## 2017-03-15 DIAGNOSIS — F419 Anxiety disorder, unspecified: Secondary | ICD-10-CM | POA: Diagnosis not present

## 2017-03-15 DIAGNOSIS — M797 Fibromyalgia: Secondary | ICD-10-CM | POA: Diagnosis not present

## 2017-03-15 DIAGNOSIS — J209 Acute bronchitis, unspecified: Secondary | ICD-10-CM | POA: Diagnosis not present

## 2017-03-15 DIAGNOSIS — G4730 Sleep apnea, unspecified: Secondary | ICD-10-CM | POA: Diagnosis not present

## 2017-03-15 DIAGNOSIS — M1990 Unspecified osteoarthritis, unspecified site: Secondary | ICD-10-CM | POA: Diagnosis not present

## 2017-03-15 DIAGNOSIS — R05 Cough: Secondary | ICD-10-CM | POA: Diagnosis not present

## 2017-03-15 DIAGNOSIS — I10 Essential (primary) hypertension: Secondary | ICD-10-CM | POA: Diagnosis not present

## 2017-03-17 DIAGNOSIS — H2513 Age-related nuclear cataract, bilateral: Secondary | ICD-10-CM | POA: Diagnosis not present

## 2017-03-17 DIAGNOSIS — H04123 Dry eye syndrome of bilateral lacrimal glands: Secondary | ICD-10-CM | POA: Diagnosis not present

## 2017-03-19 DIAGNOSIS — M1712 Unilateral primary osteoarthritis, left knee: Secondary | ICD-10-CM | POA: Diagnosis not present

## 2017-03-19 DIAGNOSIS — R262 Difficulty in walking, not elsewhere classified: Secondary | ICD-10-CM | POA: Diagnosis not present

## 2017-03-19 DIAGNOSIS — Z9889 Other specified postprocedural states: Secondary | ICD-10-CM | POA: Diagnosis not present

## 2017-03-23 DIAGNOSIS — R262 Difficulty in walking, not elsewhere classified: Secondary | ICD-10-CM | POA: Diagnosis not present

## 2017-03-23 DIAGNOSIS — Z9889 Other specified postprocedural states: Secondary | ICD-10-CM | POA: Diagnosis not present

## 2017-03-23 DIAGNOSIS — M1712 Unilateral primary osteoarthritis, left knee: Secondary | ICD-10-CM | POA: Diagnosis not present

## 2017-03-25 DIAGNOSIS — M5416 Radiculopathy, lumbar region: Secondary | ICD-10-CM | POA: Diagnosis not present

## 2017-03-27 DIAGNOSIS — N3000 Acute cystitis without hematuria: Secondary | ICD-10-CM | POA: Insufficient documentation

## 2017-03-27 DIAGNOSIS — Z6833 Body mass index (BMI) 33.0-33.9, adult: Secondary | ICD-10-CM | POA: Diagnosis not present

## 2017-03-27 DIAGNOSIS — R300 Dysuria: Secondary | ICD-10-CM | POA: Diagnosis not present

## 2017-03-27 DIAGNOSIS — R05 Cough: Secondary | ICD-10-CM | POA: Diagnosis not present

## 2017-03-29 DIAGNOSIS — F419 Anxiety disorder, unspecified: Secondary | ICD-10-CM | POA: Diagnosis not present

## 2017-03-29 DIAGNOSIS — Z7951 Long term (current) use of inhaled steroids: Secondary | ICD-10-CM | POA: Diagnosis not present

## 2017-03-29 DIAGNOSIS — K521 Toxic gastroenteritis and colitis: Secondary | ICD-10-CM | POA: Diagnosis not present

## 2017-03-29 DIAGNOSIS — R1032 Left lower quadrant pain: Secondary | ICD-10-CM | POA: Diagnosis not present

## 2017-03-29 DIAGNOSIS — Z79899 Other long term (current) drug therapy: Secondary | ICD-10-CM | POA: Diagnosis not present

## 2017-03-29 DIAGNOSIS — F329 Major depressive disorder, single episode, unspecified: Secondary | ICD-10-CM | POA: Diagnosis not present

## 2017-03-29 DIAGNOSIS — T50905A Adverse effect of unspecified drugs, medicaments and biological substances, initial encounter: Secondary | ICD-10-CM | POA: Diagnosis not present

## 2017-03-29 DIAGNOSIS — R300 Dysuria: Secondary | ICD-10-CM | POA: Diagnosis not present

## 2017-03-29 DIAGNOSIS — I10 Essential (primary) hypertension: Secondary | ICD-10-CM | POA: Diagnosis not present

## 2017-03-29 DIAGNOSIS — M797 Fibromyalgia: Secondary | ICD-10-CM | POA: Diagnosis not present

## 2017-03-29 DIAGNOSIS — T368X5A Adverse effect of other systemic antibiotics, initial encounter: Secondary | ICD-10-CM | POA: Diagnosis not present

## 2017-03-29 DIAGNOSIS — R102 Pelvic and perineal pain: Secondary | ICD-10-CM | POA: Diagnosis not present

## 2017-03-29 DIAGNOSIS — M1990 Unspecified osteoarthritis, unspecified site: Secondary | ICD-10-CM | POA: Diagnosis not present

## 2017-03-29 DIAGNOSIS — R350 Frequency of micturition: Secondary | ICD-10-CM | POA: Diagnosis not present

## 2017-04-06 DIAGNOSIS — R262 Difficulty in walking, not elsewhere classified: Secondary | ICD-10-CM | POA: Diagnosis not present

## 2017-04-06 DIAGNOSIS — Z9889 Other specified postprocedural states: Secondary | ICD-10-CM | POA: Diagnosis not present

## 2017-04-06 DIAGNOSIS — M1712 Unilateral primary osteoarthritis, left knee: Secondary | ICD-10-CM | POA: Diagnosis not present

## 2017-04-08 DIAGNOSIS — R262 Difficulty in walking, not elsewhere classified: Secondary | ICD-10-CM | POA: Diagnosis not present

## 2017-04-08 DIAGNOSIS — M1712 Unilateral primary osteoarthritis, left knee: Secondary | ICD-10-CM | POA: Diagnosis not present

## 2017-04-08 DIAGNOSIS — Z9889 Other specified postprocedural states: Secondary | ICD-10-CM | POA: Diagnosis not present

## 2017-04-13 DIAGNOSIS — Z9889 Other specified postprocedural states: Secondary | ICD-10-CM | POA: Diagnosis not present

## 2017-04-13 DIAGNOSIS — M1712 Unilateral primary osteoarthritis, left knee: Secondary | ICD-10-CM | POA: Diagnosis not present

## 2017-04-13 DIAGNOSIS — M545 Low back pain: Secondary | ICD-10-CM | POA: Diagnosis not present

## 2017-04-13 DIAGNOSIS — R262 Difficulty in walking, not elsewhere classified: Secondary | ICD-10-CM | POA: Diagnosis not present

## 2017-04-13 DIAGNOSIS — I10 Essential (primary) hypertension: Secondary | ICD-10-CM | POA: Diagnosis not present

## 2017-04-14 DIAGNOSIS — M1712 Unilateral primary osteoarthritis, left knee: Secondary | ICD-10-CM | POA: Diagnosis not present

## 2017-04-14 DIAGNOSIS — M25562 Pain in left knee: Secondary | ICD-10-CM | POA: Diagnosis not present

## 2017-04-14 DIAGNOSIS — M25462 Effusion, left knee: Secondary | ICD-10-CM | POA: Diagnosis not present

## 2017-04-15 DIAGNOSIS — M545 Low back pain: Secondary | ICD-10-CM | POA: Diagnosis not present

## 2017-04-15 DIAGNOSIS — I10 Essential (primary) hypertension: Secondary | ICD-10-CM | POA: Diagnosis not present

## 2017-04-15 DIAGNOSIS — R262 Difficulty in walking, not elsewhere classified: Secondary | ICD-10-CM | POA: Diagnosis not present

## 2017-04-15 DIAGNOSIS — Z9889 Other specified postprocedural states: Secondary | ICD-10-CM | POA: Diagnosis not present

## 2017-04-15 DIAGNOSIS — M1712 Unilateral primary osteoarthritis, left knee: Secondary | ICD-10-CM | POA: Diagnosis not present

## 2017-04-20 DIAGNOSIS — L570 Actinic keratosis: Secondary | ICD-10-CM | POA: Diagnosis not present

## 2017-04-21 DIAGNOSIS — M47816 Spondylosis without myelopathy or radiculopathy, lumbar region: Secondary | ICD-10-CM | POA: Diagnosis not present

## 2017-04-22 DIAGNOSIS — M9903 Segmental and somatic dysfunction of lumbar region: Secondary | ICD-10-CM | POA: Diagnosis not present

## 2017-04-22 DIAGNOSIS — M546 Pain in thoracic spine: Secondary | ICD-10-CM | POA: Diagnosis not present

## 2017-04-22 DIAGNOSIS — S134XXA Sprain of ligaments of cervical spine, initial encounter: Secondary | ICD-10-CM | POA: Diagnosis not present

## 2017-04-22 DIAGNOSIS — M47816 Spondylosis without myelopathy or radiculopathy, lumbar region: Secondary | ICD-10-CM | POA: Diagnosis not present

## 2017-04-22 DIAGNOSIS — M9901 Segmental and somatic dysfunction of cervical region: Secondary | ICD-10-CM | POA: Diagnosis not present

## 2017-04-22 DIAGNOSIS — M9902 Segmental and somatic dysfunction of thoracic region: Secondary | ICD-10-CM | POA: Diagnosis not present

## 2017-04-23 DIAGNOSIS — M47816 Spondylosis without myelopathy or radiculopathy, lumbar region: Secondary | ICD-10-CM | POA: Diagnosis not present

## 2017-04-27 DIAGNOSIS — M9902 Segmental and somatic dysfunction of thoracic region: Secondary | ICD-10-CM | POA: Diagnosis not present

## 2017-04-27 DIAGNOSIS — M47816 Spondylosis without myelopathy or radiculopathy, lumbar region: Secondary | ICD-10-CM | POA: Diagnosis not present

## 2017-04-27 DIAGNOSIS — S134XXA Sprain of ligaments of cervical spine, initial encounter: Secondary | ICD-10-CM | POA: Diagnosis not present

## 2017-04-27 DIAGNOSIS — M546 Pain in thoracic spine: Secondary | ICD-10-CM | POA: Diagnosis not present

## 2017-04-27 DIAGNOSIS — M9903 Segmental and somatic dysfunction of lumbar region: Secondary | ICD-10-CM | POA: Diagnosis not present

## 2017-04-27 DIAGNOSIS — M9901 Segmental and somatic dysfunction of cervical region: Secondary | ICD-10-CM | POA: Diagnosis not present

## 2017-04-29 DIAGNOSIS — M9901 Segmental and somatic dysfunction of cervical region: Secondary | ICD-10-CM | POA: Diagnosis not present

## 2017-04-29 DIAGNOSIS — M9902 Segmental and somatic dysfunction of thoracic region: Secondary | ICD-10-CM | POA: Diagnosis not present

## 2017-04-29 DIAGNOSIS — S134XXA Sprain of ligaments of cervical spine, initial encounter: Secondary | ICD-10-CM | POA: Diagnosis not present

## 2017-04-29 DIAGNOSIS — M546 Pain in thoracic spine: Secondary | ICD-10-CM | POA: Diagnosis not present

## 2017-04-29 DIAGNOSIS — M47816 Spondylosis without myelopathy or radiculopathy, lumbar region: Secondary | ICD-10-CM | POA: Diagnosis not present

## 2017-04-29 DIAGNOSIS — M9903 Segmental and somatic dysfunction of lumbar region: Secondary | ICD-10-CM | POA: Diagnosis not present

## 2017-05-04 DIAGNOSIS — S134XXA Sprain of ligaments of cervical spine, initial encounter: Secondary | ICD-10-CM | POA: Diagnosis not present

## 2017-05-04 DIAGNOSIS — M9901 Segmental and somatic dysfunction of cervical region: Secondary | ICD-10-CM | POA: Diagnosis not present

## 2017-05-04 DIAGNOSIS — M9903 Segmental and somatic dysfunction of lumbar region: Secondary | ICD-10-CM | POA: Diagnosis not present

## 2017-05-04 DIAGNOSIS — M9902 Segmental and somatic dysfunction of thoracic region: Secondary | ICD-10-CM | POA: Diagnosis not present

## 2017-05-04 DIAGNOSIS — M47816 Spondylosis without myelopathy or radiculopathy, lumbar region: Secondary | ICD-10-CM | POA: Diagnosis not present

## 2017-05-04 DIAGNOSIS — M546 Pain in thoracic spine: Secondary | ICD-10-CM | POA: Diagnosis not present

## 2017-05-07 DIAGNOSIS — S134XXA Sprain of ligaments of cervical spine, initial encounter: Secondary | ICD-10-CM | POA: Diagnosis not present

## 2017-05-07 DIAGNOSIS — M9902 Segmental and somatic dysfunction of thoracic region: Secondary | ICD-10-CM | POA: Diagnosis not present

## 2017-05-07 DIAGNOSIS — M9901 Segmental and somatic dysfunction of cervical region: Secondary | ICD-10-CM | POA: Diagnosis not present

## 2017-05-07 DIAGNOSIS — M9903 Segmental and somatic dysfunction of lumbar region: Secondary | ICD-10-CM | POA: Diagnosis not present

## 2017-05-07 DIAGNOSIS — M546 Pain in thoracic spine: Secondary | ICD-10-CM | POA: Diagnosis not present

## 2017-05-07 DIAGNOSIS — M47816 Spondylosis without myelopathy or radiculopathy, lumbar region: Secondary | ICD-10-CM | POA: Diagnosis not present

## 2017-05-08 DIAGNOSIS — M47816 Spondylosis without myelopathy or radiculopathy, lumbar region: Secondary | ICD-10-CM | POA: Diagnosis not present

## 2017-05-11 DIAGNOSIS — S134XXA Sprain of ligaments of cervical spine, initial encounter: Secondary | ICD-10-CM | POA: Diagnosis not present

## 2017-05-11 DIAGNOSIS — M9901 Segmental and somatic dysfunction of cervical region: Secondary | ICD-10-CM | POA: Diagnosis not present

## 2017-05-11 DIAGNOSIS — M47816 Spondylosis without myelopathy or radiculopathy, lumbar region: Secondary | ICD-10-CM | POA: Diagnosis not present

## 2017-05-11 DIAGNOSIS — M9902 Segmental and somatic dysfunction of thoracic region: Secondary | ICD-10-CM | POA: Diagnosis not present

## 2017-05-11 DIAGNOSIS — M546 Pain in thoracic spine: Secondary | ICD-10-CM | POA: Diagnosis not present

## 2017-05-11 DIAGNOSIS — M9903 Segmental and somatic dysfunction of lumbar region: Secondary | ICD-10-CM | POA: Diagnosis not present

## 2017-05-12 DIAGNOSIS — M1712 Unilateral primary osteoarthritis, left knee: Secondary | ICD-10-CM | POA: Diagnosis not present

## 2017-05-14 DIAGNOSIS — M47816 Spondylosis without myelopathy or radiculopathy, lumbar region: Secondary | ICD-10-CM | POA: Diagnosis not present

## 2017-05-14 DIAGNOSIS — M9902 Segmental and somatic dysfunction of thoracic region: Secondary | ICD-10-CM | POA: Diagnosis not present

## 2017-05-14 DIAGNOSIS — M9901 Segmental and somatic dysfunction of cervical region: Secondary | ICD-10-CM | POA: Diagnosis not present

## 2017-05-14 DIAGNOSIS — M9903 Segmental and somatic dysfunction of lumbar region: Secondary | ICD-10-CM | POA: Diagnosis not present

## 2017-05-14 DIAGNOSIS — M546 Pain in thoracic spine: Secondary | ICD-10-CM | POA: Diagnosis not present

## 2017-05-14 DIAGNOSIS — S134XXA Sprain of ligaments of cervical spine, initial encounter: Secondary | ICD-10-CM | POA: Diagnosis not present

## 2017-05-18 DIAGNOSIS — M9902 Segmental and somatic dysfunction of thoracic region: Secondary | ICD-10-CM | POA: Diagnosis not present

## 2017-05-18 DIAGNOSIS — M9901 Segmental and somatic dysfunction of cervical region: Secondary | ICD-10-CM | POA: Diagnosis not present

## 2017-05-18 DIAGNOSIS — M9903 Segmental and somatic dysfunction of lumbar region: Secondary | ICD-10-CM | POA: Diagnosis not present

## 2017-05-18 DIAGNOSIS — S134XXA Sprain of ligaments of cervical spine, initial encounter: Secondary | ICD-10-CM | POA: Diagnosis not present

## 2017-05-18 DIAGNOSIS — M47816 Spondylosis without myelopathy or radiculopathy, lumbar region: Secondary | ICD-10-CM | POA: Diagnosis not present

## 2017-05-18 DIAGNOSIS — M546 Pain in thoracic spine: Secondary | ICD-10-CM | POA: Diagnosis not present

## 2017-05-19 DIAGNOSIS — M1712 Unilateral primary osteoarthritis, left knee: Secondary | ICD-10-CM | POA: Diagnosis not present

## 2017-05-20 DIAGNOSIS — M9902 Segmental and somatic dysfunction of thoracic region: Secondary | ICD-10-CM | POA: Diagnosis not present

## 2017-05-20 DIAGNOSIS — M9903 Segmental and somatic dysfunction of lumbar region: Secondary | ICD-10-CM | POA: Diagnosis not present

## 2017-05-20 DIAGNOSIS — M47816 Spondylosis without myelopathy or radiculopathy, lumbar region: Secondary | ICD-10-CM | POA: Diagnosis not present

## 2017-05-20 DIAGNOSIS — M546 Pain in thoracic spine: Secondary | ICD-10-CM | POA: Diagnosis not present

## 2017-05-20 DIAGNOSIS — M9901 Segmental and somatic dysfunction of cervical region: Secondary | ICD-10-CM | POA: Diagnosis not present

## 2017-05-20 DIAGNOSIS — S134XXA Sprain of ligaments of cervical spine, initial encounter: Secondary | ICD-10-CM | POA: Diagnosis not present

## 2017-05-22 DIAGNOSIS — M9902 Segmental and somatic dysfunction of thoracic region: Secondary | ICD-10-CM | POA: Diagnosis not present

## 2017-05-22 DIAGNOSIS — M9903 Segmental and somatic dysfunction of lumbar region: Secondary | ICD-10-CM | POA: Diagnosis not present

## 2017-05-22 DIAGNOSIS — S134XXA Sprain of ligaments of cervical spine, initial encounter: Secondary | ICD-10-CM | POA: Diagnosis not present

## 2017-05-22 DIAGNOSIS — M546 Pain in thoracic spine: Secondary | ICD-10-CM | POA: Diagnosis not present

## 2017-05-22 DIAGNOSIS — M9901 Segmental and somatic dysfunction of cervical region: Secondary | ICD-10-CM | POA: Diagnosis not present

## 2017-05-22 DIAGNOSIS — M47816 Spondylosis without myelopathy or radiculopathy, lumbar region: Secondary | ICD-10-CM | POA: Diagnosis not present

## 2017-05-25 DIAGNOSIS — S134XXA Sprain of ligaments of cervical spine, initial encounter: Secondary | ICD-10-CM | POA: Diagnosis not present

## 2017-05-25 DIAGNOSIS — M9902 Segmental and somatic dysfunction of thoracic region: Secondary | ICD-10-CM | POA: Diagnosis not present

## 2017-05-25 DIAGNOSIS — M9903 Segmental and somatic dysfunction of lumbar region: Secondary | ICD-10-CM | POA: Diagnosis not present

## 2017-05-25 DIAGNOSIS — M9901 Segmental and somatic dysfunction of cervical region: Secondary | ICD-10-CM | POA: Diagnosis not present

## 2017-05-25 DIAGNOSIS — M546 Pain in thoracic spine: Secondary | ICD-10-CM | POA: Diagnosis not present

## 2017-05-25 DIAGNOSIS — M47816 Spondylosis without myelopathy or radiculopathy, lumbar region: Secondary | ICD-10-CM | POA: Diagnosis not present

## 2017-05-26 DIAGNOSIS — M25462 Effusion, left knee: Secondary | ICD-10-CM | POA: Diagnosis not present

## 2017-05-26 DIAGNOSIS — M1712 Unilateral primary osteoarthritis, left knee: Secondary | ICD-10-CM | POA: Diagnosis not present

## 2017-05-27 DIAGNOSIS — M47816 Spondylosis without myelopathy or radiculopathy, lumbar region: Secondary | ICD-10-CM | POA: Diagnosis not present

## 2017-05-27 DIAGNOSIS — M546 Pain in thoracic spine: Secondary | ICD-10-CM | POA: Diagnosis not present

## 2017-05-27 DIAGNOSIS — M9902 Segmental and somatic dysfunction of thoracic region: Secondary | ICD-10-CM | POA: Diagnosis not present

## 2017-05-27 DIAGNOSIS — M9903 Segmental and somatic dysfunction of lumbar region: Secondary | ICD-10-CM | POA: Diagnosis not present

## 2017-05-27 DIAGNOSIS — S134XXA Sprain of ligaments of cervical spine, initial encounter: Secondary | ICD-10-CM | POA: Diagnosis not present

## 2017-05-27 DIAGNOSIS — M9901 Segmental and somatic dysfunction of cervical region: Secondary | ICD-10-CM | POA: Diagnosis not present

## 2017-06-01 DIAGNOSIS — M5416 Radiculopathy, lumbar region: Secondary | ICD-10-CM | POA: Diagnosis not present

## 2017-06-01 DIAGNOSIS — M545 Low back pain: Secondary | ICD-10-CM | POA: Diagnosis not present

## 2017-06-02 ENCOUNTER — Other Ambulatory Visit (HOSPITAL_COMMUNITY): Payer: Self-pay | Admitting: Orthopedic Surgery

## 2017-06-02 DIAGNOSIS — M546 Pain in thoracic spine: Secondary | ICD-10-CM | POA: Diagnosis not present

## 2017-06-02 DIAGNOSIS — M9901 Segmental and somatic dysfunction of cervical region: Secondary | ICD-10-CM | POA: Diagnosis not present

## 2017-06-02 DIAGNOSIS — M5410 Radiculopathy, site unspecified: Secondary | ICD-10-CM

## 2017-06-02 DIAGNOSIS — M47816 Spondylosis without myelopathy or radiculopathy, lumbar region: Secondary | ICD-10-CM | POA: Diagnosis not present

## 2017-06-02 DIAGNOSIS — M9903 Segmental and somatic dysfunction of lumbar region: Secondary | ICD-10-CM | POA: Diagnosis not present

## 2017-06-02 DIAGNOSIS — M9902 Segmental and somatic dysfunction of thoracic region: Secondary | ICD-10-CM | POA: Diagnosis not present

## 2017-06-02 DIAGNOSIS — S134XXA Sprain of ligaments of cervical spine, initial encounter: Secondary | ICD-10-CM | POA: Diagnosis not present

## 2017-06-04 ENCOUNTER — Ambulatory Visit (HOSPITAL_COMMUNITY)
Admission: RE | Admit: 2017-06-04 | Discharge: 2017-06-04 | Disposition: A | Discharge status: Home / Self Care | Payer: Medicare Other | Source: Ambulatory Visit | Attending: Orthopedic Surgery | Admitting: Orthopedic Surgery

## 2017-06-04 DIAGNOSIS — M545 Low back pain: Secondary | ICD-10-CM | POA: Diagnosis not present

## 2017-06-04 DIAGNOSIS — M5410 Radiculopathy, site unspecified: Secondary | ICD-10-CM | POA: Diagnosis present

## 2017-06-04 DIAGNOSIS — M4726 Other spondylosis with radiculopathy, lumbar region: Secondary | ICD-10-CM | POA: Insufficient documentation

## 2017-06-04 DIAGNOSIS — M48061 Spinal stenosis, lumbar region without neurogenic claudication: Secondary | ICD-10-CM | POA: Diagnosis not present

## 2017-06-16 DIAGNOSIS — M25552 Pain in left hip: Secondary | ICD-10-CM | POA: Diagnosis not present

## 2017-06-17 DIAGNOSIS — M25552 Pain in left hip: Secondary | ICD-10-CM | POA: Diagnosis not present

## 2017-06-18 DIAGNOSIS — M1612 Unilateral primary osteoarthritis, left hip: Secondary | ICD-10-CM | POA: Diagnosis not present

## 2017-06-18 DIAGNOSIS — M25552 Pain in left hip: Secondary | ICD-10-CM | POA: Diagnosis not present

## 2017-06-18 DIAGNOSIS — M25462 Effusion, left knee: Secondary | ICD-10-CM | POA: Diagnosis not present

## 2017-06-25 DIAGNOSIS — M546 Pain in thoracic spine: Secondary | ICD-10-CM | POA: Diagnosis not present

## 2017-06-25 DIAGNOSIS — M9901 Segmental and somatic dysfunction of cervical region: Secondary | ICD-10-CM | POA: Diagnosis not present

## 2017-06-25 DIAGNOSIS — M9902 Segmental and somatic dysfunction of thoracic region: Secondary | ICD-10-CM | POA: Diagnosis not present

## 2017-06-25 DIAGNOSIS — S134XXA Sprain of ligaments of cervical spine, initial encounter: Secondary | ICD-10-CM | POA: Diagnosis not present

## 2017-06-25 DIAGNOSIS — M9903 Segmental and somatic dysfunction of lumbar region: Secondary | ICD-10-CM | POA: Diagnosis not present

## 2017-06-25 DIAGNOSIS — M47816 Spondylosis without myelopathy or radiculopathy, lumbar region: Secondary | ICD-10-CM | POA: Diagnosis not present

## 2017-06-26 DIAGNOSIS — S134XXA Sprain of ligaments of cervical spine, initial encounter: Secondary | ICD-10-CM | POA: Diagnosis not present

## 2017-06-26 DIAGNOSIS — M546 Pain in thoracic spine: Secondary | ICD-10-CM | POA: Diagnosis not present

## 2017-06-26 DIAGNOSIS — M9902 Segmental and somatic dysfunction of thoracic region: Secondary | ICD-10-CM | POA: Diagnosis not present

## 2017-06-26 DIAGNOSIS — M9903 Segmental and somatic dysfunction of lumbar region: Secondary | ICD-10-CM | POA: Diagnosis not present

## 2017-06-26 DIAGNOSIS — M47816 Spondylosis without myelopathy or radiculopathy, lumbar region: Secondary | ICD-10-CM | POA: Diagnosis not present

## 2017-06-26 DIAGNOSIS — M9901 Segmental and somatic dysfunction of cervical region: Secondary | ICD-10-CM | POA: Diagnosis not present

## 2017-06-29 DIAGNOSIS — M546 Pain in thoracic spine: Secondary | ICD-10-CM | POA: Diagnosis not present

## 2017-06-29 DIAGNOSIS — M9902 Segmental and somatic dysfunction of thoracic region: Secondary | ICD-10-CM | POA: Diagnosis not present

## 2017-06-29 DIAGNOSIS — M9901 Segmental and somatic dysfunction of cervical region: Secondary | ICD-10-CM | POA: Diagnosis not present

## 2017-06-29 DIAGNOSIS — M9903 Segmental and somatic dysfunction of lumbar region: Secondary | ICD-10-CM | POA: Diagnosis not present

## 2017-06-29 DIAGNOSIS — M47816 Spondylosis without myelopathy or radiculopathy, lumbar region: Secondary | ICD-10-CM | POA: Diagnosis not present

## 2017-06-29 DIAGNOSIS — S134XXA Sprain of ligaments of cervical spine, initial encounter: Secondary | ICD-10-CM | POA: Diagnosis not present

## 2017-07-01 DIAGNOSIS — M9903 Segmental and somatic dysfunction of lumbar region: Secondary | ICD-10-CM | POA: Diagnosis not present

## 2017-07-01 DIAGNOSIS — M9901 Segmental and somatic dysfunction of cervical region: Secondary | ICD-10-CM | POA: Diagnosis not present

## 2017-07-01 DIAGNOSIS — M9902 Segmental and somatic dysfunction of thoracic region: Secondary | ICD-10-CM | POA: Diagnosis not present

## 2017-07-01 DIAGNOSIS — M47816 Spondylosis without myelopathy or radiculopathy, lumbar region: Secondary | ICD-10-CM | POA: Diagnosis not present

## 2017-07-01 DIAGNOSIS — M546 Pain in thoracic spine: Secondary | ICD-10-CM | POA: Diagnosis not present

## 2017-07-01 DIAGNOSIS — S134XXA Sprain of ligaments of cervical spine, initial encounter: Secondary | ICD-10-CM | POA: Diagnosis not present

## 2017-07-03 DIAGNOSIS — M9903 Segmental and somatic dysfunction of lumbar region: Secondary | ICD-10-CM | POA: Diagnosis not present

## 2017-07-03 DIAGNOSIS — M47816 Spondylosis without myelopathy or radiculopathy, lumbar region: Secondary | ICD-10-CM | POA: Diagnosis not present

## 2017-07-03 DIAGNOSIS — M9902 Segmental and somatic dysfunction of thoracic region: Secondary | ICD-10-CM | POA: Diagnosis not present

## 2017-07-03 DIAGNOSIS — M546 Pain in thoracic spine: Secondary | ICD-10-CM | POA: Diagnosis not present

## 2017-07-03 DIAGNOSIS — Z6834 Body mass index (BMI) 34.0-34.9, adult: Secondary | ICD-10-CM | POA: Diagnosis not present

## 2017-07-03 DIAGNOSIS — M9901 Segmental and somatic dysfunction of cervical region: Secondary | ICD-10-CM | POA: Diagnosis not present

## 2017-07-03 DIAGNOSIS — S134XXA Sprain of ligaments of cervical spine, initial encounter: Secondary | ICD-10-CM | POA: Diagnosis not present

## 2017-07-03 DIAGNOSIS — M797 Fibromyalgia: Secondary | ICD-10-CM | POA: Diagnosis not present

## 2017-07-06 DIAGNOSIS — M546 Pain in thoracic spine: Secondary | ICD-10-CM | POA: Diagnosis not present

## 2017-07-06 DIAGNOSIS — M9903 Segmental and somatic dysfunction of lumbar region: Secondary | ICD-10-CM | POA: Diagnosis not present

## 2017-07-06 DIAGNOSIS — M9901 Segmental and somatic dysfunction of cervical region: Secondary | ICD-10-CM | POA: Diagnosis not present

## 2017-07-06 DIAGNOSIS — M47816 Spondylosis without myelopathy or radiculopathy, lumbar region: Secondary | ICD-10-CM | POA: Diagnosis not present

## 2017-07-06 DIAGNOSIS — M9902 Segmental and somatic dysfunction of thoracic region: Secondary | ICD-10-CM | POA: Diagnosis not present

## 2017-07-06 DIAGNOSIS — S134XXA Sprain of ligaments of cervical spine, initial encounter: Secondary | ICD-10-CM | POA: Diagnosis not present

## 2017-07-10 DIAGNOSIS — S134XXA Sprain of ligaments of cervical spine, initial encounter: Secondary | ICD-10-CM | POA: Diagnosis not present

## 2017-07-10 DIAGNOSIS — M9901 Segmental and somatic dysfunction of cervical region: Secondary | ICD-10-CM | POA: Diagnosis not present

## 2017-07-10 DIAGNOSIS — M47816 Spondylosis without myelopathy or radiculopathy, lumbar region: Secondary | ICD-10-CM | POA: Diagnosis not present

## 2017-07-10 DIAGNOSIS — M546 Pain in thoracic spine: Secondary | ICD-10-CM | POA: Diagnosis not present

## 2017-07-10 DIAGNOSIS — M9902 Segmental and somatic dysfunction of thoracic region: Secondary | ICD-10-CM | POA: Diagnosis not present

## 2017-07-10 DIAGNOSIS — M9903 Segmental and somatic dysfunction of lumbar region: Secondary | ICD-10-CM | POA: Diagnosis not present

## 2017-07-13 DIAGNOSIS — M9903 Segmental and somatic dysfunction of lumbar region: Secondary | ICD-10-CM | POA: Diagnosis not present

## 2017-07-13 DIAGNOSIS — M546 Pain in thoracic spine: Secondary | ICD-10-CM | POA: Diagnosis not present

## 2017-07-13 DIAGNOSIS — M47816 Spondylosis without myelopathy or radiculopathy, lumbar region: Secondary | ICD-10-CM | POA: Diagnosis not present

## 2017-07-13 DIAGNOSIS — M9901 Segmental and somatic dysfunction of cervical region: Secondary | ICD-10-CM | POA: Diagnosis not present

## 2017-07-13 DIAGNOSIS — M9902 Segmental and somatic dysfunction of thoracic region: Secondary | ICD-10-CM | POA: Diagnosis not present

## 2017-07-13 DIAGNOSIS — S134XXA Sprain of ligaments of cervical spine, initial encounter: Secondary | ICD-10-CM | POA: Diagnosis not present

## 2017-07-15 DIAGNOSIS — S134XXA Sprain of ligaments of cervical spine, initial encounter: Secondary | ICD-10-CM | POA: Diagnosis not present

## 2017-07-15 DIAGNOSIS — M9902 Segmental and somatic dysfunction of thoracic region: Secondary | ICD-10-CM | POA: Diagnosis not present

## 2017-07-15 DIAGNOSIS — M47816 Spondylosis without myelopathy or radiculopathy, lumbar region: Secondary | ICD-10-CM | POA: Diagnosis not present

## 2017-07-15 DIAGNOSIS — M9901 Segmental and somatic dysfunction of cervical region: Secondary | ICD-10-CM | POA: Diagnosis not present

## 2017-07-15 DIAGNOSIS — M546 Pain in thoracic spine: Secondary | ICD-10-CM | POA: Diagnosis not present

## 2017-07-15 DIAGNOSIS — M9903 Segmental and somatic dysfunction of lumbar region: Secondary | ICD-10-CM | POA: Diagnosis not present

## 2017-07-20 DIAGNOSIS — S134XXA Sprain of ligaments of cervical spine, initial encounter: Secondary | ICD-10-CM | POA: Diagnosis not present

## 2017-07-20 DIAGNOSIS — M47816 Spondylosis without myelopathy or radiculopathy, lumbar region: Secondary | ICD-10-CM | POA: Diagnosis not present

## 2017-07-20 DIAGNOSIS — M9901 Segmental and somatic dysfunction of cervical region: Secondary | ICD-10-CM | POA: Diagnosis not present

## 2017-07-20 DIAGNOSIS — M546 Pain in thoracic spine: Secondary | ICD-10-CM | POA: Diagnosis not present

## 2017-07-20 DIAGNOSIS — M9903 Segmental and somatic dysfunction of lumbar region: Secondary | ICD-10-CM | POA: Diagnosis not present

## 2017-07-20 DIAGNOSIS — M9902 Segmental and somatic dysfunction of thoracic region: Secondary | ICD-10-CM | POA: Diagnosis not present

## 2017-07-22 DIAGNOSIS — M25552 Pain in left hip: Secondary | ICD-10-CM | POA: Diagnosis not present

## 2017-07-23 DIAGNOSIS — N3010 Interstitial cystitis (chronic) without hematuria: Secondary | ICD-10-CM | POA: Diagnosis not present

## 2017-07-23 DIAGNOSIS — M5417 Radiculopathy, lumbosacral region: Secondary | ICD-10-CM | POA: Diagnosis not present

## 2017-07-23 DIAGNOSIS — F5105 Insomnia due to other mental disorder: Secondary | ICD-10-CM | POA: Insufficient documentation

## 2017-07-23 DIAGNOSIS — Z6834 Body mass index (BMI) 34.0-34.9, adult: Secondary | ICD-10-CM | POA: Diagnosis not present

## 2017-07-23 DIAGNOSIS — I10 Essential (primary) hypertension: Secondary | ICD-10-CM | POA: Diagnosis not present

## 2017-07-23 DIAGNOSIS — Z1331 Encounter for screening for depression: Secondary | ICD-10-CM | POA: Diagnosis not present

## 2017-07-23 DIAGNOSIS — Z1389 Encounter for screening for other disorder: Secondary | ICD-10-CM | POA: Diagnosis not present

## 2017-07-23 DIAGNOSIS — M797 Fibromyalgia: Secondary | ICD-10-CM | POA: Diagnosis not present

## 2017-07-27 DIAGNOSIS — M9901 Segmental and somatic dysfunction of cervical region: Secondary | ICD-10-CM | POA: Diagnosis not present

## 2017-07-27 DIAGNOSIS — M9903 Segmental and somatic dysfunction of lumbar region: Secondary | ICD-10-CM | POA: Diagnosis not present

## 2017-07-27 DIAGNOSIS — S134XXA Sprain of ligaments of cervical spine, initial encounter: Secondary | ICD-10-CM | POA: Diagnosis not present

## 2017-07-27 DIAGNOSIS — M546 Pain in thoracic spine: Secondary | ICD-10-CM | POA: Diagnosis not present

## 2017-07-27 DIAGNOSIS — M47816 Spondylosis without myelopathy or radiculopathy, lumbar region: Secondary | ICD-10-CM | POA: Diagnosis not present

## 2017-07-27 DIAGNOSIS — M9902 Segmental and somatic dysfunction of thoracic region: Secondary | ICD-10-CM | POA: Diagnosis not present

## 2017-07-29 DIAGNOSIS — S134XXA Sprain of ligaments of cervical spine, initial encounter: Secondary | ICD-10-CM | POA: Diagnosis not present

## 2017-07-29 DIAGNOSIS — M9902 Segmental and somatic dysfunction of thoracic region: Secondary | ICD-10-CM | POA: Diagnosis not present

## 2017-07-29 DIAGNOSIS — M546 Pain in thoracic spine: Secondary | ICD-10-CM | POA: Diagnosis not present

## 2017-07-29 DIAGNOSIS — M47816 Spondylosis without myelopathy or radiculopathy, lumbar region: Secondary | ICD-10-CM | POA: Diagnosis not present

## 2017-07-29 DIAGNOSIS — M9901 Segmental and somatic dysfunction of cervical region: Secondary | ICD-10-CM | POA: Diagnosis not present

## 2017-07-29 DIAGNOSIS — M9903 Segmental and somatic dysfunction of lumbar region: Secondary | ICD-10-CM | POA: Diagnosis not present

## 2017-08-03 DIAGNOSIS — M9902 Segmental and somatic dysfunction of thoracic region: Secondary | ICD-10-CM | POA: Diagnosis not present

## 2017-08-03 DIAGNOSIS — M47816 Spondylosis without myelopathy or radiculopathy, lumbar region: Secondary | ICD-10-CM | POA: Diagnosis not present

## 2017-08-03 DIAGNOSIS — M546 Pain in thoracic spine: Secondary | ICD-10-CM | POA: Diagnosis not present

## 2017-08-03 DIAGNOSIS — M9903 Segmental and somatic dysfunction of lumbar region: Secondary | ICD-10-CM | POA: Diagnosis not present

## 2017-08-03 DIAGNOSIS — M9901 Segmental and somatic dysfunction of cervical region: Secondary | ICD-10-CM | POA: Diagnosis not present

## 2017-08-03 DIAGNOSIS — S134XXA Sprain of ligaments of cervical spine, initial encounter: Secondary | ICD-10-CM | POA: Diagnosis not present

## 2017-08-04 DIAGNOSIS — M1712 Unilateral primary osteoarthritis, left knee: Secondary | ICD-10-CM | POA: Diagnosis not present

## 2017-08-04 DIAGNOSIS — M5441 Lumbago with sciatica, right side: Secondary | ICD-10-CM | POA: Diagnosis not present

## 2017-08-04 DIAGNOSIS — M545 Low back pain: Secondary | ICD-10-CM | POA: Diagnosis not present

## 2017-08-06 DIAGNOSIS — M47816 Spondylosis without myelopathy or radiculopathy, lumbar region: Secondary | ICD-10-CM | POA: Diagnosis not present

## 2017-08-06 DIAGNOSIS — M546 Pain in thoracic spine: Secondary | ICD-10-CM | POA: Diagnosis not present

## 2017-08-06 DIAGNOSIS — S134XXA Sprain of ligaments of cervical spine, initial encounter: Secondary | ICD-10-CM | POA: Diagnosis not present

## 2017-08-06 DIAGNOSIS — M9903 Segmental and somatic dysfunction of lumbar region: Secondary | ICD-10-CM | POA: Diagnosis not present

## 2017-08-06 DIAGNOSIS — M9902 Segmental and somatic dysfunction of thoracic region: Secondary | ICD-10-CM | POA: Diagnosis not present

## 2017-08-06 DIAGNOSIS — M9901 Segmental and somatic dysfunction of cervical region: Secondary | ICD-10-CM | POA: Diagnosis not present

## 2017-08-07 DIAGNOSIS — M5416 Radiculopathy, lumbar region: Secondary | ICD-10-CM | POA: Diagnosis not present

## 2017-08-10 DIAGNOSIS — M47816 Spondylosis without myelopathy or radiculopathy, lumbar region: Secondary | ICD-10-CM | POA: Diagnosis not present

## 2017-08-10 DIAGNOSIS — M9901 Segmental and somatic dysfunction of cervical region: Secondary | ICD-10-CM | POA: Diagnosis not present

## 2017-08-10 DIAGNOSIS — M546 Pain in thoracic spine: Secondary | ICD-10-CM | POA: Diagnosis not present

## 2017-08-10 DIAGNOSIS — M9902 Segmental and somatic dysfunction of thoracic region: Secondary | ICD-10-CM | POA: Diagnosis not present

## 2017-08-10 DIAGNOSIS — M9903 Segmental and somatic dysfunction of lumbar region: Secondary | ICD-10-CM | POA: Diagnosis not present

## 2017-08-10 DIAGNOSIS — S134XXA Sprain of ligaments of cervical spine, initial encounter: Secondary | ICD-10-CM | POA: Diagnosis not present

## 2017-08-13 DIAGNOSIS — M47816 Spondylosis without myelopathy or radiculopathy, lumbar region: Secondary | ICD-10-CM | POA: Diagnosis not present

## 2017-08-13 DIAGNOSIS — M9903 Segmental and somatic dysfunction of lumbar region: Secondary | ICD-10-CM | POA: Diagnosis not present

## 2017-08-13 DIAGNOSIS — M546 Pain in thoracic spine: Secondary | ICD-10-CM | POA: Diagnosis not present

## 2017-08-13 DIAGNOSIS — M9902 Segmental and somatic dysfunction of thoracic region: Secondary | ICD-10-CM | POA: Diagnosis not present

## 2017-08-13 DIAGNOSIS — S134XXA Sprain of ligaments of cervical spine, initial encounter: Secondary | ICD-10-CM | POA: Diagnosis not present

## 2017-08-13 DIAGNOSIS — M9901 Segmental and somatic dysfunction of cervical region: Secondary | ICD-10-CM | POA: Diagnosis not present

## 2017-08-17 DIAGNOSIS — M546 Pain in thoracic spine: Secondary | ICD-10-CM | POA: Diagnosis not present

## 2017-08-17 DIAGNOSIS — M9903 Segmental and somatic dysfunction of lumbar region: Secondary | ICD-10-CM | POA: Diagnosis not present

## 2017-08-17 DIAGNOSIS — M9902 Segmental and somatic dysfunction of thoracic region: Secondary | ICD-10-CM | POA: Diagnosis not present

## 2017-08-17 DIAGNOSIS — M9901 Segmental and somatic dysfunction of cervical region: Secondary | ICD-10-CM | POA: Diagnosis not present

## 2017-08-17 DIAGNOSIS — S134XXA Sprain of ligaments of cervical spine, initial encounter: Secondary | ICD-10-CM | POA: Diagnosis not present

## 2017-08-17 DIAGNOSIS — M47816 Spondylosis without myelopathy or radiculopathy, lumbar region: Secondary | ICD-10-CM | POA: Diagnosis not present

## 2017-08-19 DIAGNOSIS — M9903 Segmental and somatic dysfunction of lumbar region: Secondary | ICD-10-CM | POA: Diagnosis not present

## 2017-08-19 DIAGNOSIS — S134XXA Sprain of ligaments of cervical spine, initial encounter: Secondary | ICD-10-CM | POA: Diagnosis not present

## 2017-08-19 DIAGNOSIS — M47816 Spondylosis without myelopathy or radiculopathy, lumbar region: Secondary | ICD-10-CM | POA: Diagnosis not present

## 2017-08-19 DIAGNOSIS — M546 Pain in thoracic spine: Secondary | ICD-10-CM | POA: Diagnosis not present

## 2017-08-19 DIAGNOSIS — M9902 Segmental and somatic dysfunction of thoracic region: Secondary | ICD-10-CM | POA: Diagnosis not present

## 2017-08-19 DIAGNOSIS — M9901 Segmental and somatic dysfunction of cervical region: Secondary | ICD-10-CM | POA: Diagnosis not present

## 2017-08-24 DIAGNOSIS — M47816 Spondylosis without myelopathy or radiculopathy, lumbar region: Secondary | ICD-10-CM | POA: Diagnosis not present

## 2017-08-24 DIAGNOSIS — M9901 Segmental and somatic dysfunction of cervical region: Secondary | ICD-10-CM | POA: Diagnosis not present

## 2017-08-24 DIAGNOSIS — S134XXA Sprain of ligaments of cervical spine, initial encounter: Secondary | ICD-10-CM | POA: Diagnosis not present

## 2017-08-24 DIAGNOSIS — M9902 Segmental and somatic dysfunction of thoracic region: Secondary | ICD-10-CM | POA: Diagnosis not present

## 2017-08-24 DIAGNOSIS — M546 Pain in thoracic spine: Secondary | ICD-10-CM | POA: Diagnosis not present

## 2017-08-24 DIAGNOSIS — M9903 Segmental and somatic dysfunction of lumbar region: Secondary | ICD-10-CM | POA: Diagnosis not present

## 2017-08-25 ENCOUNTER — Emergency Department (HOSPITAL_COMMUNITY)
Admission: EM | Admit: 2017-08-25 | Discharge: 2017-08-25 | Disposition: A | Discharge status: Home / Self Care | Payer: Medicare Other | Attending: Emergency Medicine | Admitting: Emergency Medicine

## 2017-08-25 ENCOUNTER — Other Ambulatory Visit: Payer: Self-pay

## 2017-08-25 ENCOUNTER — Encounter (HOSPITAL_COMMUNITY): Payer: Self-pay | Admitting: Emergency Medicine

## 2017-08-25 DIAGNOSIS — M5440 Lumbago with sciatica, unspecified side: Secondary | ICD-10-CM

## 2017-08-25 DIAGNOSIS — I10 Essential (primary) hypertension: Secondary | ICD-10-CM | POA: Insufficient documentation

## 2017-08-25 DIAGNOSIS — Z79899 Other long term (current) drug therapy: Secondary | ICD-10-CM | POA: Diagnosis not present

## 2017-08-25 DIAGNOSIS — M5136 Other intervertebral disc degeneration, lumbar region: Secondary | ICD-10-CM | POA: Insufficient documentation

## 2017-08-25 DIAGNOSIS — M5442 Lumbago with sciatica, left side: Secondary | ICD-10-CM | POA: Diagnosis not present

## 2017-08-25 DIAGNOSIS — M545 Low back pain: Secondary | ICD-10-CM | POA: Diagnosis present

## 2017-08-25 DIAGNOSIS — M62830 Muscle spasm of back: Secondary | ICD-10-CM | POA: Diagnosis not present

## 2017-08-25 MED ORDER — ONDANSETRON HCL 4 MG PO TABS
4.0000 mg | ORAL_TABLET | Freq: Once | ORAL | Status: AC
  Administered 2017-08-25: 4 mg via ORAL
  Filled 2017-08-25: qty 1

## 2017-08-25 MED ORDER — HYDROCODONE-ACETAMINOPHEN 5-325 MG PO TABS
2.0000 | ORAL_TABLET | Freq: Once | ORAL | Status: AC
  Administered 2017-08-25: 2 via ORAL
  Filled 2017-08-25: qty 2

## 2017-08-25 MED ORDER — DIAZEPAM 5 MG PO TABS
10.0000 mg | ORAL_TABLET | Freq: Once | ORAL | Status: AC
  Administered 2017-08-25: 10 mg via ORAL
  Filled 2017-08-25: qty 2

## 2017-08-25 MED ORDER — HYDROCODONE-ACETAMINOPHEN 5-325 MG PO TABS: 1.0000 | ORAL_TABLET | ORAL | 0 refills | Status: DC | PRN

## 2017-08-25 MED ORDER — CYCLOBENZAPRINE HCL 10 MG PO TABS: 10.0000 mg | ORAL_TABLET | Freq: Three times a day (TID) | ORAL | 0 refills | Status: DC

## 2017-08-25 NOTE — Discharge Instructions (Addendum)
Review of your most recent MRI suggests degenerative disc disease most prominent at the L4-L5 area.  Your examination is negative for acute neurologic deficit tonight.  Heating pad to the area may be helpful.  Please discuss your pain management with Dr. Nadara Mustard, and/or your surgical specialist.  You have been given a couple days of pain medication to use as you may contact with your physician.  These medications may cause drowsiness, please use them with caution.

## 2017-08-25 NOTE — ED Provider Notes (Signed)
[facility] EMERGENCY DEPARTMENT Provider Note   CSN: [id number] Arrival date & time: 08/25/17  9798     History   Chief Complaint Chief Complaint  Patient presents with  . Back Pain    HPI [name] is a 67 y.o. female.  Patient is a 67 year old female who presents to the emergency department with complaint of severe back pain.  Patient states she is been having problems with her back for quite some time.  She has been seen by orthopedic specialist in [place].  She says that she had an MRI done back in May, but something happened to the results and she most recently was told that she had something that was "hitting the nerve" every now and then.  She was scheduled to have a nerve block procedure today, but could not have it because she had been taking ibuprofen along with her other pain medication.  The patient states that she is out of her pain medication, and she was not given any medication today when her procedure was canceled.  She denies loss of bowel or bladder function.  She denies frequent falls.  She denies unusual numbness or tingling in the saddle areas.  She presents to the emergency department because she says she cannot take this pain any longer.  The history is provided by the patient.    Past Medical History:  Diagnosis Date  . Anxiety   . Back pain   . Cystitides, interstitial, chronic   . Depression   . Fibromyalgia   . GERD (gastroesophageal reflux disease)   . High cholesterol   . Hypertension   . Shortness of breath   . Sleep apnea   . Urinary disorder    bladder is in sling per pt  . Urinary tract infection   . Vertigo     Patient Active Problem List   Diagnosis Date Noted  . Vasomotor instability 02/26/2015  . Dizziness and giddiness 08/14/2014  . Gait difficulty 08/14/2014  . Depression 03/01/2014    Past Surgical History:  Procedure Laterality Date  . ABDOMINAL HYSTERECTOMY  1997   [place] , [employer]  . bladder tack    .  COLONOSCOPY N/A 08/23/2014   Procedure: COLONOSCOPY;  Surgeon: Rogene Houston, MD;  Location: [facility];  Service: Endoscopy;  Laterality: N/A;  1200  . KNEE SURGERY Left 05/26/14   meniscus tear  . LAPAROSCOPIC SALPINGO OOPHERECTOMY  02/17/2012   Procedure: LAPAROSCOPIC SALPINGO OOPHORECTOMY;  Surgeon: Jonnie Kind, MD;  Location: [facility] ORS;  Service: Gynecology;  Laterality: Bilateral;  . LASIK    . TOE SURGERY       OB History   None      Home Medications    Prior to Admission medications   Medication Sig Start Date End Date Taking? Authorizing Provider  acetaminophen (TYLENOL) 500 MG tablet Take 500 mg by mouth as needed.    [provider]  albuterol (PROVENTIL HFA;VENTOLIN HFA) 108 (90 Base) MCG/ACT inhaler Inhale 1-2 puffs into the lungs every 6 (six) hours as needed for wheezing or shortness of breath. 03/12/17   Evalee Jefferson, PA-C  amLODipine (NORVASC) 5 MG tablet Take 5 mg by mouth daily.    [provider]  benzonatate (TESSALON) 100 MG capsule Take 2 capsules (200 mg total) by mouth 3 (three) times daily as needed. 03/12/17   Evalee Jefferson, PA-C  bisacodyl (DULCOLAX) 10 MG suppository Place 1 suppository (10 mg total) rectally daily as needed for moderate  constipation. 09/11/15   Rogene Houston, MD  cetirizine (ZYRTEC) 10 MG tablet Take 10 mg by mouth daily as needed. Allergies    [provider]  DimenhyDRINATE (DRAMAMINE PO) Take 1 tablet by mouth as needed.     [provider]  DULoxetine (CYMBALTA) 60 MG capsule Take 1 capsule (60 mg total) by mouth 2 (two) times daily. 08/01/16   Cloria Spring, MD  estradiol (ESTRACE) 1 MG tablet TAKE ONE TABLET BY MOUTH DAILY. Patient not taking: Reported on 08/01/2016 09/10/15   Jonnie Kind, MD  FIBER SELECT GUMMIES PO Take 4 g by mouth daily. Forensic psychologist     [provider]  fluticasone (FLONASE) 50 MCG/ACT nasal spray Place 1 spray into both nostrils daily.     [provider]  gabapentin (NEURONTIN) 300 MG capsule Take 300 mg by mouth 2 (two) times daily.     [provider]  lisinopril-hydrochlorothiazide (PRINZIDE,ZESTORETIC) 20-12.5 MG per tablet Take 1 tablet by mouth 2 (two) times daily.    [provider]  loperamide (IMODIUM) 2 MG capsule Take 1 capsule (2 mg total) by mouth daily before breakfast. 05/08/15   Rehman, Mechele Dawley, MD  meclizine (ANTIVERT) 25 MG tablet Take 25 mg by mouth every 6 (six) hours as needed for dizziness.    [provider]  Methenamine-Sodium Salicylate (CYSTEX) 579-038.3 MG TABS Take 2 tablets by mouth daily as needed. For urinary pain    [provider]  methylphenidate (RITALIN) 20 MG tablet Take 1 tablet (20 mg total) by mouth 2 (two) times daily with breakfast and lunch. 08/01/16 08/01/17  Cloria Spring, MD  methylphenidate (RITALIN) 20 MG tablet Take 1 tablet (20 mg total) by mouth 2 (two) times daily with breakfast and lunch. 08/01/16 08/01/17  Cloria Spring, MD  metoprolol (LOPRESSOR) 100 MG tablet Take 100 mg by mouth 2 (two) times daily.     [provider]  ondansetron (ZOFRAN) 4 MG tablet Take 1 tablet (4 mg total) by mouth 2 (two) times daily as needed for nausea or vomiting. Patient taking differently: Take 4 mg by mouth as needed for nausea or vomiting.  08/25/14   Rogene Houston, MD  Probiotic Product (PROBIOTIC DAILY PO) Take by mouth daily. Patient states that she takes 2-4 per day.    [provider]  simvastatin (ZOCOR) 20 MG tablet Take 10 mg by mouth every evening.     [provider]  traMADol (ULTRAM) 50 MG tablet Take 50 mg by mouth 3 (three) times daily as needed.     [provider]    Family History Family History  Problem Relation Age of Onset  . Atrial fibrillation Mother   . COPD Mother   . Hypertension Mother   . Hypertension Father   . Cancer Father        throat   . Drug abuse Brother   . Alcohol abuse Brother   . Early  death Brother   . Fibromyalgia Daughter   . Paranoid behavior Son   . Alcohol abuse Son   . Hypertension Brother   . Sleep apnea Brother   . Alcohol abuse Brother   . Drug abuse Brother     Social History Social History   Tobacco Use  . Smoking status: Never Smoker  . Smokeless tobacco: Never Used  Substance Use Topics  . Alcohol use: No    Alcohol/week: 0.0 standard drinks    Comment:  occassionalym, pt denies any use   . Drug use: No     Allergies   Patient has no known allergies.   Review of Systems Review of Systems  Constitutional: Negative for activity change.       All ROS Neg except as noted in HPI  HENT: Negative for nosebleeds.   Eyes: Negative for photophobia and discharge.  Respiratory: Negative for cough, shortness of breath and wheezing.   Cardiovascular: Negative for chest pain and palpitations.  Gastrointestinal: Negative for abdominal pain and blood in stool.  Genitourinary: Negative for dysuria, frequency and hematuria.  Musculoskeletal: Positive for back pain. Negative for arthralgias and neck pain.  Skin: Negative.   Neurological: Negative for dizziness, seizures and speech difficulty.  Psychiatric/Behavioral: Negative for confusion and hallucinations. The patient is nervous/anxious.      Physical Exam Updated Vital Signs BP (!) 167/56 (BP Location: Right Arm)   Pulse 74   Temp 98.2 F (36.8 C) (Oral)   Resp 15   Ht 5\' 5"  (1.651 m)   Wt 90.7 kg   SpO2 99%   BMI 33.28 kg/m   Physical Exam  Musculoskeletal:       Lumbar back: She exhibits pain and spasm.  There is pain and spasm to the paraspinal area in the lumbar region on the left.  There is pain with attempted range of motion of the left lower extremity that affects the back.  There are no hot areas of the lumbar area.  No hot joints appreciated.  The dorsalis pedis pulses 2+ bilaterally.  Neurological:  There are no motor or sensory deficits appreciated of the lower extremities.   There is no sensory deficit of the saddle area.     ED Treatments / Results  Labs (all labs ordered are listed, but only abnormal results are displayed) Labs Reviewed - No data to display  EKG None  Radiology No results found.  Procedures Procedures (including critical care time)  Medications Ordered in ED Medications - No data to display   Initial Impression / Assessment and Plan / ED Course  I have reviewed the triage vital signs and the nursing notes.  Pertinent labs & imaging results that were available during my care of the patient were reviewed by me and considered in my medical decision making (see chart for details).       Final Clinical Impressions(s) / ED Diagnoses MDM  Blood pressure is elevated at 167/56, otherwise the vital signs are within normal limits.  The examination is limited because the patient states that she is having so much pain.  The portion of the examination of the patient will allow does not show any emergent neurologic deficit at this time.  I have reviewed the MRI from May of this year, the patient has multiple sites of degenerative disc bulging.  It is most pronounced at the L4-L5 area with some stenosis present.  Patient was treated in the emergency department with oral Valium and hydrocodone.  Patient is given 2 days of pain medication to use as she gets in touch with her primary physician.  I have asked her to ask for assistance with pain management until she can be rescheduled with her orthopedic specialist.   Final diagnoses:  Acute left-sided low back pain with sciatica, sciatica laterality unspecified  DDD (degenerative disc disease), lumbar    ED Discharge Orders         Ordered    cyclobenzaprine (FLEXERIL) 10 MG tablet  3 times daily  08/25/17 2123    HYDROcodone-acetaminophen (NORCO/VICODIN) 5-325 MG tablet  Every 4 hours PRN     08/25/17 2123           Lily Kocher, PA-C 08/25/17 2132    Julianne Rice,  MD 08/28/17 [phone]

## 2017-08-25 NOTE — ED Triage Notes (Signed)
Pt c/o severe back pain and was supposed to have procedure on back today but she has been taking ibuprofen with pain meds so they could not do the procedure. Pt states she is here for pain management.

## 2017-08-27 ENCOUNTER — Encounter (INDEPENDENT_AMBULATORY_CARE_PROVIDER_SITE_OTHER): Payer: Self-pay | Admitting: Orthopaedic Surgery

## 2017-08-27 ENCOUNTER — Ambulatory Visit (INDEPENDENT_AMBULATORY_CARE_PROVIDER_SITE_OTHER): Payer: Medicare Other | Admitting: Orthopaedic Surgery

## 2017-08-27 VITALS — BP 110/68 | HR 69 | Ht 65.5 in | Wt 205.0 lb

## 2017-08-27 DIAGNOSIS — M48061 Spinal stenosis, lumbar region without neurogenic claudication: Secondary | ICD-10-CM | POA: Diagnosis not present

## 2017-08-27 NOTE — Progress Notes (Signed)
Office Visit Note   Patient: [name]           Date of Birth: [birth year]           MRN: [id number] Visit Date: 08/27/2017              Requested by: Rory Percy, MD [place], [place] [address] PCP: Rory Percy, MD   Assessment & Plan: Visit Diagnoses: No diagnosis found.  Plan: Patient's been regularly treated by her chiropractor which is ongoing 3 times a week.  She is been on oxycodone hydrocodone prednisone multiple times Flexeril physical therapy.  X-rays demonstrate disc degeneration with asymmetrical endplate spurring worse on the left at L3-4 with endplate abutment and on the right at L4-5 with endplate abutment.  MRI scan demonstrates moderate stenosis at the L4-5 level.  Check her back again in 4 weeks and obtained lateral flexion-extension lumbar x-ray on return and [facility] lateral C-spine x-ray.  Her back is much more symptomatic than her neck.  We reviewed the MRI scan that showed disc desiccation at the lumbar spine bottom 2 levels.  We discussed weight loss helping her back.  Problems with anxiety and depression exacerbate her pain problems.  [employer] website reviewed..  Positive for oxycodone, hydrocodone tramadol, Xanax.  Long discussion with patient that long-term narcotic medication is not the solution to her problem.  She may be a candidate for lumbar surgery but postop pain would be much better controlled if she was not taking narcotics regularly before the procedure.  I plan to recheck her again in 4 weeks.  X-rays as listed above.  Follow-Up Instructions: Return in about 4 weeks (around 09/24/2017).   Orders:  No orders of the defined types were placed in this encounter.  No orders of the defined types were placed in this encounter.     Procedures: No procedures performed   Clinical Data: No additional findings.   Subjective: Chief Complaint  Patient presents with  . Lower Back - Pain    HPI 67 year old female who is been treated at [facility] is here for second opinion.  She states she has no known recent injury but was involved in an [place] in 2012.  She states she is been told she has bulging disc in the lumbar spine she has both neck pain and low back pain but has more low back pain.  She has numbness that goes from the left anterior thigh down to her ankle.  Is been worse in the last 8 months.  She has had epidural steroid injections, oxycodone, hydrocodone, previous knee arthroscopy by Dr. Berenice Primas in February 2019.  Her lumbar spine has been treated with Flexeril physical therapy most recent chiropractic treatments prednisone multiple Dosepaks.  She has history of neuropathy in her feet.  She has gait difficulty and problems with vertigo.  Problems with depression and anxiety.  No chills or fever no associated bowel or bladder symptoms.  Review of Systems 14 point review of systems positive for acid reflux anxiety depression asthma bladder problems cataracts hypertension history of pneumonia sleep apnea self-reported diagnosis of fibromyalgia.  Previous hysterectomy 96 Lasix surgery 2002 previous toe surgery.  Bladder sling intact procedure.  Ovary surgery.  Knee arthroscopy 2015 also 2019.  She takes Xanax morning at night also Cymbalta.  Otherwise negative as it pertains HPI.  Primary care physician is Dr. Rory Percy.  Objective: Vital Signs: BP 110/68   Pulse 69   Ht 5' 5.5" (1.664 m)  Wt 205 lb (93 kg)   BMI 33.59 kg/m   Physical Exam  Constitutional: She is oriented to person, place, and time. She appears well-developed.  HENT:  Head: Normocephalic.  Right Ear: External ear normal.  Left Ear: External ear normal.  Eyes: Pupils are equal, round, and reactive to light.  Neck: No tracheal deviation present. No thyromegaly present.  Cardiovascular: Normal rate.  Pulmonary/Chest: Effort normal.  Abdominal: Soft.  Neurological: She is alert and oriented to person, place, and time.  Skin: Skin is warm and dry.    Psychiatric: She has a normal mood and affect. Her behavior is normal.    Ortho Exam patient has negative logroll the hips.  She has some discomfort with straight leg raising at 90 degrees on the left.  Negative on the right.  Patient has brachial plexus tenderness worse on the right than left.  No supraclavicular lymphadenopathy.  Negative drop arm test.  Some tenderness over the long head of the biceps mild to moderate, coracoid, pes bursa, greater trochanters.  Sciatic notch tenderness worse on the left than right.  Quads hamstrings hip flexors are strong.  Anterior tib gastrocsoleus is strong.  Specialty Comments:  No specialty comments available.  Imaging: CLINICAL DATA:  Low back pain radiating to the left leg for 3 years  EXAM: MRI LUMBAR SPINE WITHOUT CONTRAST  TECHNIQUE: Multiplanar, multisequence MR imaging of the lumbar spine was performed. No intravenous contrast was administered.  COMPARISON:  None.  FINDINGS: Segmentation:  Standard.  Alignment:  Physiologic.  Vertebrae: No fracture, evidence of discitis, or aggressive bone lesion. T1 and T2 hyperintense L3 vertebral body bone lesion most consistent with a hemangioma.  Conus medullaris and cauda equina: Conus extends to the T12 level. Conus and cauda equina appear normal.  Paraspinal and other soft tissues: Negative.  Disc levels:  Disc spaces: Degenerative disc disease with disc height loss at L3-4 and L4-5.  T12-L1: Mild broad based disc bulge. No evidence of neural foraminal stenosis. No central canal stenosis.  L1-L2: Mild broad-based disc bulge. No evidence of neural foraminal stenosis. No central canal stenosis.  L2-L3: Broad-based disc bulge with a broad right foraminal disc protrusion. No evidence of neural foraminal stenosis. No central canal stenosis.  L3-L4: Broad-based disc bulge with a small left paracentral disc protrusion with left lateral recess stenosis. No evidence of  neural foraminal stenosis. No central canal stenosis.  L4-L5: Broad-based disc bulge. Moderate bilateral facet arthropathy. Moderate spinal stenosis and bilateral lateral recess stenosis. Mild right foraminal stenosis.  L5-S1: Mild broad-based disc bulge. Mild bilateral facet arthropathy. No evidence of neural foraminal stenosis. No central canal stenosis.  IMPRESSION: 1. At L4-5 there is a broad-based disc bulge. Moderate bilateral facet arthropathy. Moderate spinal stenosis and bilateral lateral recess stenosis. Mild right foraminal stenosis. 2. Lumbar spine spondylosis as described above.   Electronically Signed   By: Kathreen Devoid   On: 06/04/2017 11:46    PMFS History: Patient Active Problem List   Diagnosis Date Noted  . Vasomotor instability 02/26/2015  . Dizziness and giddiness 08/14/2014  . Gait difficulty 08/14/2014  . Depression 03/01/2014   Past Medical History:  Diagnosis Date  . Anxiety   . Back pain   . Cystitides, interstitial, chronic   . Depression   . Fibromyalgia   . GERD (gastroesophageal reflux disease)   . High cholesterol   . Hypertension   . Shortness of breath   . Sleep apnea   . Urinary disorder  bladder is in sling per pt  . Urinary tract infection   . Vertigo     Family History  Problem Relation Age of Onset  . Atrial fibrillation Mother   . COPD Mother   . Hypertension Mother   . Hypertension Father   . Cancer Father        throat   . Drug abuse Brother   . Alcohol abuse Brother   . Early death Brother   . Fibromyalgia Daughter   . Paranoid behavior Son   . Alcohol abuse Son   . Hypertension Brother   . Sleep apnea Brother   . Alcohol abuse Brother   . Drug abuse Brother     Past Surgical History:  Procedure Laterality Date  . ABDOMINAL HYSTERECTOMY  1997   [place] , [employer]  . bladder tack    . COLONOSCOPY N/A 08/23/2014   Procedure: COLONOSCOPY;  Surgeon: Rogene Houston, MD;  Location: [facility];   Service: Endoscopy;  Laterality: N/A;  1200  . KNEE SURGERY Left 05/26/14   meniscus tear  . LAPAROSCOPIC SALPINGO OOPHERECTOMY  02/17/2012   Procedure: LAPAROSCOPIC SALPINGO OOPHORECTOMY;  Surgeon: Jonnie Kind, MD;  Location: [facility];  Service: Gynecology;  Laterality: Bilateral;  . LASIK    . TOE SURGERY     Social History   Occupational History    Employer: UNEMPLOYED  Tobacco Use  . Smoking status: Never Smoker  . Smokeless tobacco: Never Used  Substance and Sexual Activity  . Alcohol use: No    Alcohol/week: 0.0 standard drinks    Comment: occassionalym, pt denies any use   . Drug use: No  . Sexual activity: Yes    Birth control/protection: Surgical

## 2017-08-28 ENCOUNTER — Telehealth (INDEPENDENT_AMBULATORY_CARE_PROVIDER_SITE_OTHER): Payer: Self-pay | Admitting: Orthopaedic Surgery

## 2017-08-28 MED ORDER — TRAMADOL HCL 50 MG PO TABS: 50.0000 mg | ORAL_TABLET | Freq: Every day | ORAL | 0 refills | Status: DC | PRN

## 2017-08-28 NOTE — Telephone Encounter (Signed)
Called to pharmacy. I left voicemail for patient advising. °

## 2017-08-28 NOTE — Telephone Encounter (Signed)
Please advise 

## 2017-08-28 NOTE — Telephone Encounter (Signed)
Patient called asked if she can get a Rx for Tramadol. Patient said she will use extra strength tylenol along with the Tramadol. Patient said she can pick up the Rx at [facility].  The number to contact patient is [phone]

## 2017-08-28 NOTE — Telephone Encounter (Signed)
OK for tramadol #40     One po daily prn pain ucall thanks

## 2017-08-30 ENCOUNTER — Encounter (INDEPENDENT_AMBULATORY_CARE_PROVIDER_SITE_OTHER): Payer: Self-pay | Admitting: Orthopaedic Surgery

## 2017-08-31 DIAGNOSIS — M9902 Segmental and somatic dysfunction of thoracic region: Secondary | ICD-10-CM | POA: Diagnosis not present

## 2017-08-31 DIAGNOSIS — M47816 Spondylosis without myelopathy or radiculopathy, lumbar region: Secondary | ICD-10-CM | POA: Diagnosis not present

## 2017-08-31 DIAGNOSIS — M9901 Segmental and somatic dysfunction of cervical region: Secondary | ICD-10-CM | POA: Diagnosis not present

## 2017-08-31 DIAGNOSIS — M9903 Segmental and somatic dysfunction of lumbar region: Secondary | ICD-10-CM | POA: Diagnosis not present

## 2017-08-31 DIAGNOSIS — S134XXA Sprain of ligaments of cervical spine, initial encounter: Secondary | ICD-10-CM | POA: Diagnosis not present

## 2017-08-31 DIAGNOSIS — M546 Pain in thoracic spine: Secondary | ICD-10-CM | POA: Diagnosis not present

## 2017-09-03 ENCOUNTER — Ambulatory Visit (INDEPENDENT_AMBULATORY_CARE_PROVIDER_SITE_OTHER): Payer: Medicare Other | Admitting: Orthopaedic Surgery

## 2017-09-03 ENCOUNTER — Ambulatory Visit (INDEPENDENT_AMBULATORY_CARE_PROVIDER_SITE_OTHER): Payer: Medicare Other

## 2017-09-03 ENCOUNTER — Encounter (INDEPENDENT_AMBULATORY_CARE_PROVIDER_SITE_OTHER): Payer: Self-pay | Admitting: Orthopaedic Surgery

## 2017-09-03 VITALS — BP 111/45 | HR 65 | Ht 65.0 in | Wt 205.0 lb

## 2017-09-03 DIAGNOSIS — M9903 Segmental and somatic dysfunction of lumbar region: Secondary | ICD-10-CM | POA: Diagnosis not present

## 2017-09-03 DIAGNOSIS — M9901 Segmental and somatic dysfunction of cervical region: Secondary | ICD-10-CM | POA: Diagnosis not present

## 2017-09-03 DIAGNOSIS — M9902 Segmental and somatic dysfunction of thoracic region: Secondary | ICD-10-CM | POA: Diagnosis not present

## 2017-09-03 DIAGNOSIS — M47816 Spondylosis without myelopathy or radiculopathy, lumbar region: Secondary | ICD-10-CM | POA: Diagnosis not present

## 2017-09-03 DIAGNOSIS — M48061 Spinal stenosis, lumbar region without neurogenic claudication: Secondary | ICD-10-CM

## 2017-09-03 DIAGNOSIS — S134XXA Sprain of ligaments of cervical spine, initial encounter: Secondary | ICD-10-CM | POA: Diagnosis not present

## 2017-09-03 DIAGNOSIS — M546 Pain in thoracic spine: Secondary | ICD-10-CM | POA: Diagnosis not present

## 2017-09-04 ENCOUNTER — Telehealth (INDEPENDENT_AMBULATORY_CARE_PROVIDER_SITE_OTHER): Payer: Self-pay | Admitting: Radiology

## 2017-09-04 MED ORDER — DICLOFENAC SODIUM 75 MG PO TBEC: DELAYED_RELEASE_TABLET | ORAL | 3 refills | Status: DC

## 2017-09-04 NOTE — Telephone Encounter (Signed)
Sent to pharmacy. I left voicemail for patient advising. 

## 2017-09-04 NOTE — Addendum Note (Signed)
Addended by: Meyer Cory on: 09/04/2017 04:09 PM   Modules accepted: Orders

## 2017-09-04 NOTE — Telephone Encounter (Signed)
Patient states that she forgot to ask about 2 medications yesterday and if she should continue them.  Diclofenac 75mg  bid and Robaxin 500mg  bid.  They were previously prescribed by Dr. Mina Marble and there are no refills.  If she should continue, could you please prescribe and send to [facility].

## 2017-09-04 NOTE — Telephone Encounter (Signed)
OK for diclofenac 75 mg po bid with meals. # 60 refill times 3.   Would stop robaxin. ucall thanks

## 2017-09-05 ENCOUNTER — Other Ambulatory Visit (HOSPITAL_COMMUNITY): Payer: Self-pay | Admitting: Psychiatry

## 2017-09-06 ENCOUNTER — Other Ambulatory Visit (INDEPENDENT_AMBULATORY_CARE_PROVIDER_SITE_OTHER): Payer: Self-pay | Admitting: Orthopaedic Surgery

## 2017-09-06 MED ORDER — TRAMADOL HCL 50 MG PO TABS: 50.0000 mg | ORAL_TABLET | Freq: Four times a day (QID) | ORAL | 0 refills | Status: DC | PRN

## 2017-09-07 DIAGNOSIS — M47816 Spondylosis without myelopathy or radiculopathy, lumbar region: Secondary | ICD-10-CM | POA: Diagnosis not present

## 2017-09-07 DIAGNOSIS — M9901 Segmental and somatic dysfunction of cervical region: Secondary | ICD-10-CM | POA: Diagnosis not present

## 2017-09-07 DIAGNOSIS — S134XXA Sprain of ligaments of cervical spine, initial encounter: Secondary | ICD-10-CM | POA: Diagnosis not present

## 2017-09-07 DIAGNOSIS — M546 Pain in thoracic spine: Secondary | ICD-10-CM | POA: Diagnosis not present

## 2017-09-07 DIAGNOSIS — M9902 Segmental and somatic dysfunction of thoracic region: Secondary | ICD-10-CM | POA: Diagnosis not present

## 2017-09-07 DIAGNOSIS — M9903 Segmental and somatic dysfunction of lumbar region: Secondary | ICD-10-CM | POA: Diagnosis not present

## 2017-09-08 ENCOUNTER — Encounter (INDEPENDENT_AMBULATORY_CARE_PROVIDER_SITE_OTHER): Payer: Self-pay | Admitting: Orthopaedic Surgery

## 2017-09-08 ENCOUNTER — Telehealth (INDEPENDENT_AMBULATORY_CARE_PROVIDER_SITE_OTHER): Payer: Self-pay | Admitting: Radiology

## 2017-09-08 MED ORDER — TRAMADOL HCL 50 MG PO TABS: 50.0000 mg | ORAL_TABLET | Freq: Two times a day (BID) | ORAL | 0 refills | Status: DC

## 2017-09-08 NOTE — Progress Notes (Signed)
Office Visit Note   Patient: [name]           Date of Birth: 12/19/1950           MRN: [id number] Visit Date: 09/03/2017              Requested by: Rory Percy, MD [place], [place] [address] PCP: Rory Percy, MD   Assessment & Plan: Visit Diagnoses:  1. Spinal stenosis of lumbar region, unspecified whether neurogenic claudication present     Plan: While the patient keep a journal concerning her back pain symptoms and try to to determine if some of her symptoms can be related to claudication of the lumbar spine.  She has some pain related to fibromyalgia, problems with depression and anxiety history is difficult to obtain from patient concerning her symptoms.  She is been on prednisone in the past taken pain medication in the past and I discussed with her that narcotic medication is not advised with her history of depression and anxiety.  I plan to check her in 2 weeks and we can review the log where she will record her symptoms with standing walking as well as with activities.  Follow-Up Instructions: Return in about 2 weeks (around 09/17/2017).   Orders:  Orders Placed This Encounter  Procedures  . XR Lumb Spine Flex&Ext Only   No orders of the defined types were placed in this encounter.     Procedures: No procedures performed   Clinical Data: No additional findings.   Subjective: Chief Complaint  Patient presents with  . Lower Back - Pain, Follow-up    HPI 67 year old female returns with recurrent problems with back pain with standing sitting also with supine position.  She has some symptoms of neurogenic claudication but also has pain when she sits.  She does better leaning on a grocery cart.  She has more back pain than neck pain but also has problems with gait difficulty and MRI scan lumbar May 2019 showed L4-5 moderate lateral recess and central stenosis.  Patient brought with her neurology notes approximately 30 pages.  This outlines her problems  with depression, anxiety, fibromyalgia, sleep apnea chronic fatigue problems with dizziness balance difficulty and headaches.  Brain MRI showed few scattered periventricular and subcortical foci of nonspecific gliosis nonspecific and could be related to autoimmune, inflammatory, postinfectious, microvascular ischemia or migraines associated etiologies.  There were no acute findings.  Scan was unchanged from 07/14/2013.  Review of Systems 14 point review of systems updated unchanged from 08/27/2017 office visit other than as mentioned above.   Objective: Vital Signs: BP (!) 111/45   Pulse 65   Ht 5\' 5"  (1.651 m)   Wt 205 lb (93 kg)   BMI 34.11 kg/m   Physical Exam  Constitutional: She is oriented to person, place, and time. She appears well-developed.  HENT:  Head: Normocephalic.  Right Ear: External ear normal.  Left Ear: External ear normal.  Eyes: Pupils are equal, round, and reactive to light.  Neck: No tracheal deviation present. No thyromegaly present.  Cardiovascular: Normal rate.  Pulmonary/Chest: Effort normal.  Abdominal: Soft.  Neurological: She is alert and oriented to person, place, and time.  Skin: Skin is warm and dry.  Psychiatric: She has a normal mood and affect. Her behavior is normal.  Patient anxious.  Not tearful on today's visit.    Ortho Exam patient is able to heel and toe walk she has some discomfort with straight leg raising at  90 degrees on the left negative on the right.  Some brachial plexus tenderness worse on the right than left without lymphadenopathy of the supraclavicular region.  Shoulder exam shows no impingement.  She has some tenderness of the long head of the biceps lateral epicondyle pes bursa greater trochanter.  Some sciatic notch tenderness worse on the left than right.  Anterior tib gastrocsoleus is strong.  Balance is rated at fair.  Specialty Comments:  No specialty comments available.  Imaging: No results found.   PMFS  History: Patient Active Problem List   Diagnosis Date Noted  . Vasomotor instability 02/26/2015  . Dizziness and giddiness 08/14/2014  . Gait difficulty 08/14/2014  . Depression 03/01/2014   Past Medical History:  Diagnosis Date  . Anxiety   . Back pain   . Cystitides, interstitial, chronic   . Depression   . Fibromyalgia   . GERD (gastroesophageal reflux disease)   . High cholesterol   . Hypertension   . Shortness of breath   . Sleep apnea   . Urinary disorder    bladder is in sling per pt  . Urinary tract infection   . Vertigo     Family History  Problem Relation Age of Onset  . Atrial fibrillation Mother   . COPD Mother   . Hypertension Mother   . Hypertension Father   . Cancer Father        throat   . Drug abuse Brother   . Alcohol abuse Brother   . Early death Brother   . Fibromyalgia Daughter   . Paranoid behavior Son   . Alcohol abuse Son   . Hypertension Brother   . Sleep apnea Brother   . Alcohol abuse Brother   . Drug abuse Brother     Past Surgical History:  Procedure Laterality Date  . ABDOMINAL HYSTERECTOMY  1997   [place] , [employer]  . bladder tack    . COLONOSCOPY N/A 08/23/2014   Procedure: COLONOSCOPY;  Surgeon: Rogene Houston, MD;  Location: [facility];  Service: Endoscopy;  Laterality: N/A;  1200  . KNEE SURGERY Left 05/26/14   meniscus tear  . LAPAROSCOPIC SALPINGO OOPHERECTOMY  02/17/2012   Procedure: LAPAROSCOPIC SALPINGO OOPHORECTOMY;  Surgeon: Jonnie Kind, MD;  Location: [facility];  Service: Gynecology;  Laterality: Bilateral;  . LASIK    . TOE SURGERY     Social History   Occupational History    Employer: UNEMPLOYED  Tobacco Use  . Smoking status: Never Smoker  . Smokeless tobacco: Never Used  Substance and Sexual Activity  . Alcohol use: No    Alcohol/week: 0.0 standard drinks    Comment: occassionalym, pt denies any use   . Drug use: No  . Sexual activity: Yes    Birth control/protection: Surgical

## 2017-09-08 NOTE — Telephone Encounter (Signed)
Patient would like tramadol called into [facility] patient said her phone has been messing up and wanted to let you know that she has been receiving messages late. Patients [phone]

## 2017-09-08 NOTE — Telephone Encounter (Signed)
Patient left voicemail on my line stating that she is out of Tramadol and had a really rough weekend with a lot of pain.  She would like a refill on Tramadol.  Please advise.   CB [phone]

## 2017-09-08 NOTE — Telephone Encounter (Signed)
I left voicemail for patient to call and let me know what pharmacy she would like med called to.

## 2017-09-08 NOTE — Addendum Note (Signed)
Addended by: Meyer Cory on: 09/08/2017 04:18 PM   Modules accepted: Orders

## 2017-09-08 NOTE — Telephone Encounter (Signed)
Called to pharmacy 

## 2017-09-08 NOTE — Telephone Encounter (Signed)
[facility]. OK for # 20 tabs thanks

## 2017-09-10 DIAGNOSIS — M47816 Spondylosis without myelopathy or radiculopathy, lumbar region: Secondary | ICD-10-CM | POA: Diagnosis not present

## 2017-09-10 DIAGNOSIS — M546 Pain in thoracic spine: Secondary | ICD-10-CM | POA: Diagnosis not present

## 2017-09-10 DIAGNOSIS — M9902 Segmental and somatic dysfunction of thoracic region: Secondary | ICD-10-CM | POA: Diagnosis not present

## 2017-09-10 DIAGNOSIS — S134XXA Sprain of ligaments of cervical spine, initial encounter: Secondary | ICD-10-CM | POA: Diagnosis not present

## 2017-09-10 DIAGNOSIS — M9901 Segmental and somatic dysfunction of cervical region: Secondary | ICD-10-CM | POA: Diagnosis not present

## 2017-09-10 DIAGNOSIS — M9903 Segmental and somatic dysfunction of lumbar region: Secondary | ICD-10-CM | POA: Diagnosis not present

## 2017-09-16 DIAGNOSIS — M5416 Radiculopathy, lumbar region: Secondary | ICD-10-CM | POA: Diagnosis not present

## 2017-09-17 ENCOUNTER — Encounter (INDEPENDENT_AMBULATORY_CARE_PROVIDER_SITE_OTHER): Payer: Self-pay | Admitting: Orthopaedic Surgery

## 2017-09-17 ENCOUNTER — Ambulatory Visit (INDEPENDENT_AMBULATORY_CARE_PROVIDER_SITE_OTHER): Payer: Medicare Other | Admitting: Orthopaedic Surgery

## 2017-09-17 VITALS — BP 134/74 | HR 66 | Ht 65.5 in | Wt 200.0 lb

## 2017-09-17 DIAGNOSIS — M48061 Spinal stenosis, lumbar region without neurogenic claudication: Secondary | ICD-10-CM | POA: Diagnosis not present

## 2017-09-17 NOTE — Progress Notes (Signed)
Office Visit Note   Patient: [name]           Date of Birth: 1950/08/28           MRN: [id number] Visit Date: 09/17/2017              Requested by: Rory Percy, MD [place], [place] [address] PCP: Rory Percy, MD   Assessment & Plan: Visit Diagnoses:  1. Spinal stenosis of lumbar region, unspecified whether neurogenic claudication present     Plan: Patient has lumbar stenosis by May 2019 MRI at L4-5 with moderate lateral recess and central stenosis.  I wrote down specific questions for her to see L4 she can walk, how long she has to sit, how far she can go after she gets up after sitting.  She states she can go to the store without leaning on a grocery cart.  Patient is a poor historian likely related to changes on her brain MRI scan.  I plan to recheck her back again in a few weeks.  She will try to keep track of how she does with walking we can determine if she is having neurogenic claudication symptoms.  Follow-Up Instructions: Return in about 2 weeks (around 10/01/2017).   Orders:  No orders of the defined types were placed in this encounter.  No orders of the defined types were placed in this encounter.     Procedures: No procedures performed   Clinical Data: No additional findings.   Subjective: Chief Complaint  Patient presents with  . Lower Back - Pain, Follow-up    HPI 67 year old female returns with ongoing problems with chronic back pain.  She states she had an epidural injection yesterday for orthopedics.  She found 4 grams tablets she had and is taken 2 of them.  She states the epidurals helped her pain is not as intense.  She is kept a book about her pain but they all describe minimal activity and pain being moderate or severe just walking across the room or going to the kitchen.  Since a poor historian, previous MRI scan showed scattered cortical foci nonspecific.  From 2015-2016.  She has back pain leg pain but seems to be doing better after  the epidural.  Review of Systems 14 point review of systems updated unchanged from 08/27/2017 other than as mentioned above.   Objective: Vital Signs: BP 134/74   Pulse 66   Ht 5' 5.5" (1.664 m)   Wt 200 lb (90.7 kg)   BMI 32.78 kg/m   Physical Exam  Constitutional: She is oriented to person, place, and time. She appears well-developed.  HENT:  Head: Normocephalic.  Right Ear: External ear normal.  Left Ear: External ear normal.  Eyes: Pupils are equal, round, and reactive to light.  Neck: No tracheal deviation present. No thyromegaly present.  Cardiovascular: Normal rate.  Pulmonary/Chest: Effort normal.  Abdominal: Soft.  Neurological: She is alert and oriented to person, place, and time.  Skin: Skin is warm and dry.  Psychiatric: She has a normal mood and affect. Her behavior is normal.    Ortho Exam negative logroll both hips knee and ankle jerk are intact.  Plexus tenderness worse on the right than left.  Mild discomfort with impingement right shoulder.  Some tenderness over the pes bursa.  Negative straight leg raising 90 degrees.  Her balance remains fair.  Specialty Comments:  No specialty comments available.  Imaging: No results found.   PMFS History: Patient Active  Problem List   Diagnosis Date Noted  . Vasomotor instability 02/26/2015  . Dizziness and giddiness 08/14/2014  . Gait difficulty 08/14/2014  . Depression 03/01/2014   Past Medical History:  Diagnosis Date  . Anxiety   . Back pain   . Cystitides, interstitial, chronic   . Depression   . Fibromyalgia   . GERD (gastroesophageal reflux disease)   . High cholesterol   . Hypertension   . Shortness of breath   . Sleep apnea   . Urinary disorder    bladder is in sling per pt  . Urinary tract infection   . Vertigo     Family History  Problem Relation Age of Onset  . Atrial fibrillation Mother   . COPD Mother   . Hypertension Mother   . Hypertension Father   . Cancer Father        throat    . Drug abuse Brother   . Alcohol abuse Brother   . Early death Brother   . Fibromyalgia Daughter   . Paranoid behavior Son   . Alcohol abuse Son   . Hypertension Brother   . Sleep apnea Brother   . Alcohol abuse Brother   . Drug abuse Brother     Past Surgical History:  Procedure Laterality Date  . ABDOMINAL HYSTERECTOMY  1997   [place] , [employer]  . bladder tack    . COLONOSCOPY N/A 08/23/2014   Procedure: COLONOSCOPY;  Surgeon: Rogene Houston, MD;  [facility]: [facility];  Service: Endoscopy;  Laterality: N/A;  1200  . KNEE SURGERY Left 05/26/14   meniscus tear  . LAPAROSCOPIC SALPINGO OOPHERECTOMY  02/17/2012   Procedure: LAPAROSCOPIC SALPINGO OOPHORECTOMY;  Surgeon: Jonnie Kind, MD;  Location: [facility];  Service: Gynecology;  Laterality: Bilateral;  . LASIK    . TOE SURGERY     Social History   Occupational History    Employer: UNEMPLOYED  Tobacco Use  . Smoking status: Never Smoker  . Smokeless tobacco: Never Used  Substance and Sexual Activity  . Alcohol use: No    Alcohol/week: 0.0 standard drinks    Comment: occassionalym, pt denies any use   . Drug use: No  . Sexual activity: Yes    Birth control/protection: Surgical

## 2017-09-18 ENCOUNTER — Telehealth (INDEPENDENT_AMBULATORY_CARE_PROVIDER_SITE_OTHER): Payer: Self-pay | Admitting: Orthopaedic Surgery

## 2017-09-18 NOTE — Telephone Encounter (Signed)
Please advise 

## 2017-09-18 NOTE — Telephone Encounter (Signed)
I called discussed. She will sit down when she starts having pain. She is keeping a journal to see how far she can walk.

## 2017-09-18 NOTE — Telephone Encounter (Signed)
Patient called left VM stating she is still in pain, [name] was seen on the 6th of September for spinal stenosis of lumbar region. Patient would like a call back to discuss her pain.

## 2017-09-24 DIAGNOSIS — S134XXA Sprain of ligaments of cervical spine, initial encounter: Secondary | ICD-10-CM | POA: Diagnosis not present

## 2017-09-24 DIAGNOSIS — M47816 Spondylosis without myelopathy or radiculopathy, lumbar region: Secondary | ICD-10-CM | POA: Diagnosis not present

## 2017-09-24 DIAGNOSIS — M546 Pain in thoracic spine: Secondary | ICD-10-CM | POA: Diagnosis not present

## 2017-09-24 DIAGNOSIS — M9903 Segmental and somatic dysfunction of lumbar region: Secondary | ICD-10-CM | POA: Diagnosis not present

## 2017-09-24 DIAGNOSIS — M9902 Segmental and somatic dysfunction of thoracic region: Secondary | ICD-10-CM | POA: Diagnosis not present

## 2017-09-24 DIAGNOSIS — M9901 Segmental and somatic dysfunction of cervical region: Secondary | ICD-10-CM | POA: Diagnosis not present

## 2017-09-29 DIAGNOSIS — M546 Pain in thoracic spine: Secondary | ICD-10-CM | POA: Diagnosis not present

## 2017-10-01 ENCOUNTER — Ambulatory Visit (INDEPENDENT_AMBULATORY_CARE_PROVIDER_SITE_OTHER): Payer: Medicare Other | Admitting: Orthopaedic Surgery

## 2017-10-07 DIAGNOSIS — M79672 Pain in left foot: Secondary | ICD-10-CM | POA: Diagnosis not present

## 2017-10-07 DIAGNOSIS — M79671 Pain in right foot: Secondary | ICD-10-CM | POA: Diagnosis not present

## 2017-10-08 DIAGNOSIS — M9903 Segmental and somatic dysfunction of lumbar region: Secondary | ICD-10-CM | POA: Diagnosis not present

## 2017-10-08 DIAGNOSIS — M546 Pain in thoracic spine: Secondary | ICD-10-CM | POA: Diagnosis not present

## 2017-10-08 DIAGNOSIS — S134XXA Sprain of ligaments of cervical spine, initial encounter: Secondary | ICD-10-CM | POA: Diagnosis not present

## 2017-10-08 DIAGNOSIS — M9902 Segmental and somatic dysfunction of thoracic region: Secondary | ICD-10-CM | POA: Diagnosis not present

## 2017-10-08 DIAGNOSIS — M47816 Spondylosis without myelopathy or radiculopathy, lumbar region: Secondary | ICD-10-CM | POA: Diagnosis not present

## 2017-10-08 DIAGNOSIS — M9901 Segmental and somatic dysfunction of cervical region: Secondary | ICD-10-CM | POA: Diagnosis not present

## 2017-10-12 DIAGNOSIS — M5416 Radiculopathy, lumbar region: Secondary | ICD-10-CM | POA: Diagnosis not present

## 2017-10-20 DIAGNOSIS — G9741 Accidental puncture or laceration of dura during a procedure: Secondary | ICD-10-CM | POA: Diagnosis not present

## 2017-10-20 DIAGNOSIS — M5416 Radiculopathy, lumbar region: Secondary | ICD-10-CM | POA: Diagnosis not present

## 2017-10-20 DIAGNOSIS — M5116 Intervertebral disc disorders with radiculopathy, lumbar region: Secondary | ICD-10-CM | POA: Diagnosis not present

## 2017-10-20 HISTORY — PX: BACK SURGERY: SHX140

## 2017-11-04 DIAGNOSIS — Z9889 Other specified postprocedural states: Secondary | ICD-10-CM | POA: Diagnosis not present

## 2017-11-19 ENCOUNTER — Other Ambulatory Visit (HOSPITAL_COMMUNITY): Payer: Self-pay | Admitting: Orthopedic Surgery

## 2017-11-19 DIAGNOSIS — M5416 Radiculopathy, lumbar region: Secondary | ICD-10-CM | POA: Diagnosis not present

## 2017-11-19 DIAGNOSIS — Z9889 Other specified postprocedural states: Secondary | ICD-10-CM | POA: Insufficient documentation

## 2017-11-19 DIAGNOSIS — M797 Fibromyalgia: Secondary | ICD-10-CM | POA: Diagnosis not present

## 2017-11-19 DIAGNOSIS — I10 Essential (primary) hypertension: Secondary | ICD-10-CM | POA: Diagnosis not present

## 2017-11-25 ENCOUNTER — Ambulatory Visit (HOSPITAL_COMMUNITY)
Admission: RE | Admit: 2017-11-25 | Discharge: 2017-11-25 | Disposition: A | Discharge status: Home / Self Care | Payer: Medicare Other | Source: Ambulatory Visit | Attending: Orthopedic Surgery | Admitting: Orthopedic Surgery

## 2017-11-25 DIAGNOSIS — M47816 Spondylosis without myelopathy or radiculopathy, lumbar region: Secondary | ICD-10-CM | POA: Diagnosis not present

## 2017-11-25 DIAGNOSIS — M5416 Radiculopathy, lumbar region: Secondary | ICD-10-CM

## 2017-11-25 DIAGNOSIS — M5126 Other intervertebral disc displacement, lumbar region: Secondary | ICD-10-CM | POA: Diagnosis not present

## 2017-11-25 LAB — POCT I-STAT CREATININE: Creatinine, Ser: 0.7 mg/dL (ref 0.44–1.00)

## 2017-11-25 MED ORDER — GADOBUTROL 1 MMOL/ML IV SOLN
10.0000 mL | Freq: Once | INTRAVENOUS | Status: AC | PRN
  Administered 2017-11-25: 10 mL via INTRAVENOUS

## 2017-12-04 DIAGNOSIS — Z9889 Other specified postprocedural states: Secondary | ICD-10-CM | POA: Diagnosis not present

## 2018-01-11 DIAGNOSIS — Z9889 Other specified postprocedural states: Secondary | ICD-10-CM | POA: Diagnosis not present

## 2018-01-12 DIAGNOSIS — M9903 Segmental and somatic dysfunction of lumbar region: Secondary | ICD-10-CM | POA: Diagnosis not present

## 2018-01-12 DIAGNOSIS — M47816 Spondylosis without myelopathy or radiculopathy, lumbar region: Secondary | ICD-10-CM | POA: Diagnosis not present

## 2018-01-12 DIAGNOSIS — M9902 Segmental and somatic dysfunction of thoracic region: Secondary | ICD-10-CM | POA: Diagnosis not present

## 2018-01-12 DIAGNOSIS — M9901 Segmental and somatic dysfunction of cervical region: Secondary | ICD-10-CM | POA: Diagnosis not present

## 2018-01-12 DIAGNOSIS — S134XXA Sprain of ligaments of cervical spine, initial encounter: Secondary | ICD-10-CM | POA: Diagnosis not present

## 2018-01-12 DIAGNOSIS — M546 Pain in thoracic spine: Secondary | ICD-10-CM | POA: Diagnosis not present

## 2018-01-15 DIAGNOSIS — M9901 Segmental and somatic dysfunction of cervical region: Secondary | ICD-10-CM | POA: Diagnosis not present

## 2018-01-15 DIAGNOSIS — M9903 Segmental and somatic dysfunction of lumbar region: Secondary | ICD-10-CM | POA: Diagnosis not present

## 2018-01-15 DIAGNOSIS — M546 Pain in thoracic spine: Secondary | ICD-10-CM | POA: Diagnosis not present

## 2018-01-15 DIAGNOSIS — M47816 Spondylosis without myelopathy or radiculopathy, lumbar region: Secondary | ICD-10-CM | POA: Diagnosis not present

## 2018-01-15 DIAGNOSIS — S134XXA Sprain of ligaments of cervical spine, initial encounter: Secondary | ICD-10-CM | POA: Diagnosis not present

## 2018-01-15 DIAGNOSIS — M9902 Segmental and somatic dysfunction of thoracic region: Secondary | ICD-10-CM | POA: Diagnosis not present

## 2018-01-18 DIAGNOSIS — M545 Low back pain: Secondary | ICD-10-CM | POA: Diagnosis not present

## 2018-01-18 DIAGNOSIS — N3010 Interstitial cystitis (chronic) without hematuria: Secondary | ICD-10-CM | POA: Diagnosis not present

## 2018-01-18 DIAGNOSIS — Z6835 Body mass index (BMI) 35.0-35.9, adult: Secondary | ICD-10-CM | POA: Diagnosis not present

## 2018-01-18 DIAGNOSIS — I10 Essential (primary) hypertension: Secondary | ICD-10-CM | POA: Diagnosis not present

## 2018-01-18 DIAGNOSIS — M5116 Intervertebral disc disorders with radiculopathy, lumbar region: Secondary | ICD-10-CM | POA: Diagnosis not present

## 2018-01-18 DIAGNOSIS — G4733 Obstructive sleep apnea (adult) (pediatric): Secondary | ICD-10-CM | POA: Insufficient documentation

## 2018-01-18 DIAGNOSIS — M5417 Radiculopathy, lumbosacral region: Secondary | ICD-10-CM | POA: Diagnosis not present

## 2018-01-18 DIAGNOSIS — M797 Fibromyalgia: Secondary | ICD-10-CM | POA: Diagnosis not present

## 2018-01-19 DIAGNOSIS — M9903 Segmental and somatic dysfunction of lumbar region: Secondary | ICD-10-CM | POA: Diagnosis not present

## 2018-01-19 DIAGNOSIS — M47816 Spondylosis without myelopathy or radiculopathy, lumbar region: Secondary | ICD-10-CM | POA: Diagnosis not present

## 2018-01-19 DIAGNOSIS — M9901 Segmental and somatic dysfunction of cervical region: Secondary | ICD-10-CM | POA: Diagnosis not present

## 2018-01-19 DIAGNOSIS — M9902 Segmental and somatic dysfunction of thoracic region: Secondary | ICD-10-CM | POA: Diagnosis not present

## 2018-01-19 DIAGNOSIS — S134XXA Sprain of ligaments of cervical spine, initial encounter: Secondary | ICD-10-CM | POA: Diagnosis not present

## 2018-01-19 DIAGNOSIS — M546 Pain in thoracic spine: Secondary | ICD-10-CM | POA: Diagnosis not present

## 2018-01-21 DIAGNOSIS — M9902 Segmental and somatic dysfunction of thoracic region: Secondary | ICD-10-CM | POA: Diagnosis not present

## 2018-01-21 DIAGNOSIS — M9903 Segmental and somatic dysfunction of lumbar region: Secondary | ICD-10-CM | POA: Diagnosis not present

## 2018-01-21 DIAGNOSIS — M9901 Segmental and somatic dysfunction of cervical region: Secondary | ICD-10-CM | POA: Diagnosis not present

## 2018-01-21 DIAGNOSIS — M47816 Spondylosis without myelopathy or radiculopathy, lumbar region: Secondary | ICD-10-CM | POA: Diagnosis not present

## 2018-01-21 DIAGNOSIS — M546 Pain in thoracic spine: Secondary | ICD-10-CM | POA: Diagnosis not present

## 2018-01-21 DIAGNOSIS — S134XXA Sprain of ligaments of cervical spine, initial encounter: Secondary | ICD-10-CM | POA: Diagnosis not present

## 2018-01-26 DIAGNOSIS — M47816 Spondylosis without myelopathy or radiculopathy, lumbar region: Secondary | ICD-10-CM | POA: Diagnosis not present

## 2018-01-26 DIAGNOSIS — M9902 Segmental and somatic dysfunction of thoracic region: Secondary | ICD-10-CM | POA: Diagnosis not present

## 2018-01-26 DIAGNOSIS — M9901 Segmental and somatic dysfunction of cervical region: Secondary | ICD-10-CM | POA: Diagnosis not present

## 2018-01-26 DIAGNOSIS — M546 Pain in thoracic spine: Secondary | ICD-10-CM | POA: Diagnosis not present

## 2018-01-26 DIAGNOSIS — S134XXA Sprain of ligaments of cervical spine, initial encounter: Secondary | ICD-10-CM | POA: Diagnosis not present

## 2018-01-26 DIAGNOSIS — M9903 Segmental and somatic dysfunction of lumbar region: Secondary | ICD-10-CM | POA: Diagnosis not present

## 2018-01-28 DIAGNOSIS — M9901 Segmental and somatic dysfunction of cervical region: Secondary | ICD-10-CM | POA: Diagnosis not present

## 2018-01-28 DIAGNOSIS — M9902 Segmental and somatic dysfunction of thoracic region: Secondary | ICD-10-CM | POA: Diagnosis not present

## 2018-01-28 DIAGNOSIS — M9903 Segmental and somatic dysfunction of lumbar region: Secondary | ICD-10-CM | POA: Diagnosis not present

## 2018-01-28 DIAGNOSIS — M47816 Spondylosis without myelopathy or radiculopathy, lumbar region: Secondary | ICD-10-CM | POA: Diagnosis not present

## 2018-01-28 DIAGNOSIS — S134XXA Sprain of ligaments of cervical spine, initial encounter: Secondary | ICD-10-CM | POA: Diagnosis not present

## 2018-01-28 DIAGNOSIS — M546 Pain in thoracic spine: Secondary | ICD-10-CM | POA: Diagnosis not present

## 2018-02-01 DIAGNOSIS — M546 Pain in thoracic spine: Secondary | ICD-10-CM | POA: Diagnosis not present

## 2018-02-01 DIAGNOSIS — M9903 Segmental and somatic dysfunction of lumbar region: Secondary | ICD-10-CM | POA: Diagnosis not present

## 2018-02-01 DIAGNOSIS — S134XXA Sprain of ligaments of cervical spine, initial encounter: Secondary | ICD-10-CM | POA: Diagnosis not present

## 2018-02-01 DIAGNOSIS — M47816 Spondylosis without myelopathy or radiculopathy, lumbar region: Secondary | ICD-10-CM | POA: Diagnosis not present

## 2018-02-01 DIAGNOSIS — M9901 Segmental and somatic dysfunction of cervical region: Secondary | ICD-10-CM | POA: Diagnosis not present

## 2018-02-01 DIAGNOSIS — M9902 Segmental and somatic dysfunction of thoracic region: Secondary | ICD-10-CM | POA: Diagnosis not present

## 2018-02-04 ENCOUNTER — Encounter: Payer: Self-pay | Admitting: Obstetrics and Gynecology

## 2018-02-04 ENCOUNTER — Ambulatory Visit (INDEPENDENT_AMBULATORY_CARE_PROVIDER_SITE_OTHER): Payer: Medicare Other | Admitting: Obstetrics and Gynecology

## 2018-02-04 VITALS — BP 138/81 | HR 76 | Ht 65.5 in | Wt 213.8 lb

## 2018-02-04 DIAGNOSIS — M9901 Segmental and somatic dysfunction of cervical region: Secondary | ICD-10-CM | POA: Diagnosis not present

## 2018-02-04 DIAGNOSIS — Z01419 Encounter for gynecological examination (general) (routine) without abnormal findings: Secondary | ICD-10-CM | POA: Diagnosis not present

## 2018-02-04 DIAGNOSIS — M47816 Spondylosis without myelopathy or radiculopathy, lumbar region: Secondary | ICD-10-CM | POA: Diagnosis not present

## 2018-02-04 DIAGNOSIS — Z1212 Encounter for screening for malignant neoplasm of rectum: Secondary | ICD-10-CM

## 2018-02-04 DIAGNOSIS — M9902 Segmental and somatic dysfunction of thoracic region: Secondary | ICD-10-CM | POA: Diagnosis not present

## 2018-02-04 DIAGNOSIS — M9903 Segmental and somatic dysfunction of lumbar region: Secondary | ICD-10-CM | POA: Diagnosis not present

## 2018-02-04 DIAGNOSIS — Z1211 Encounter for screening for malignant neoplasm of colon: Secondary | ICD-10-CM | POA: Diagnosis not present

## 2018-02-04 DIAGNOSIS — M546 Pain in thoracic spine: Secondary | ICD-10-CM | POA: Diagnosis not present

## 2018-02-04 DIAGNOSIS — S134XXA Sprain of ligaments of cervical spine, initial encounter: Secondary | ICD-10-CM | POA: Diagnosis not present

## 2018-02-04 NOTE — Progress Notes (Signed)
[facility] Visit  02/04/2018            Patient name: [name] MRN [id number]  Date of birth: 1950/10/27  CC & HPI:  [name] is a 68 y.o. female presenting today for physical exam. Pt reports associated intermittent vaginal discharge and skin tags. She removed one of her skin tags with dental floss. She uses aloe spray to her groin/thighs almost everyday. Pt has tried ibuprofen, however, notes that her stomach started to feel "raw" and she had rectal bleeding. She has since stopped taking ibuprofen. She didn't go be evaluated for her rectal bleeding in the past. She hasn't had any sexual activity for the past 3 years. She had a hysterectomy in 1996.   She notes that she has gained 20 lbs since Christmas 2019. She does have a PCP and she has been going to a naturopathic provider as well. She notes that she has chronic back pain with ruptured discs removal 10/20/2017. She has a hx of neuropathy to her bilateral feet.   ROS:  ROS +intermittent vaginal discharge +skin tags +rectal bleeding, resolved All systems are negative except as noted in the HPI and PMH.   Family history: Husband very bitter over family issues, has prostate issues, no sexual activity x3 years Pertinent History Reviewed:   Reviewed: Significant for  Medical         Past Medical History:  Diagnosis Date  . Anxiety   . Back pain   . Cystitides, interstitial, chronic   . Depression   . Fibromyalgia   . GERD (gastroesophageal reflux disease)   . High cholesterol   . Hypertension   . Shortness of breath   . Sleep apnea   . Urinary disorder    bladder is in sling per pt  . Urinary tract infection   . Vertigo                               Surgical Hx:    Past Surgical History:  Procedure Laterality Date  . ABDOMINAL HYSTERECTOMY  1997   [place] [facility] [place] [facility]  10/20/2017  . bladder tack    . COLONOSCOPY N/A 08/23/2014   Procedure: COLONOSCOPY;  Surgeon: Rogene Houston,  MD;  [facility];  Service: Endoscopy;  Laterality: N/A;  1200  . KNEE SURGERY Left 05/26/14   meniscus tear  . LAPAROSCOPIC SALPINGO OOPHERECTOMY  02/17/2012   Procedure: LAPAROSCOPIC SALPINGO OOPHORECTOMY;  Surgeon: Jonnie Kind, MD;  Location: [facility];  Service: Gynecology;  Laterality: Bilateral;  . LASIK    . TOE SURGERY     Medications: Reviewed & Updated - see associated section                       Current Outpatient Medications:  .  acetaminophen (TYLENOL) 500 MG tablet, Take 500 mg by mouth as needed., Disp: , Rfl:  .  ALPRAZolam (XANAX) 0.5 MG tablet, Take 0.5 mg by mouth 2 (two) times daily. )5 [place] Progression Recent Vital Signs  @VS @   Past Medical History: No date: Anxiety No date: Back pain No date: Cystitides, interstitial, chronic No date: Depression No date: Fibromyalgia No date: GERD (gastroesophageal reflux disease) No date: High cholesterol No date: Hypertension No date: Shortness of breath No date: Sleep apnea No date: Urinary disorder     Comment:  bladder is in  sling per pt No date: Urinary tract infection No date: Vertigo   Expected Discharge Date    Diet Order    None      VTE Documentation     Work Intensity Score/Level of Care    @LEVELOFCARE @   Mobility       Significant Events  Takes 0.5 mg in the am and 1 tab qhs, Disp: , Rfl: 5 .  amLODipine (NORVASC) 5 MG tablet, Take 2.5 mg by mouth daily. , Disp: , Rfl:  .  bisacodyl (DULCOLAX) 10 MG suppository, Place 1 suppository (10 mg total) rectally daily as needed for moderate constipation., Disp: 12 suppository, Rfl: 0 .  cyclobenzaprine (FLEXERIL) 10 MG tablet, Take 1 tablet (10 mg total) by mouth 3 (three) times daily. (Patient taking differently: Take 10 mg by mouth as needed. ), Disp: 6 tablet, Rfl: 0 .  DULoxetine (CYMBALTA) 60 MG capsule, Take 1 capsule (60 mg total) by mouth 2 (two) times daily. (Patient taking differently: Take 60 mg by mouth daily. ), Disp: 60 capsule, Rfl: 2 .  fluticasone (FLONASE)  50 MCG/ACT nasal spray, Place 1 spray into both nostrils as needed. , Disp: , Rfl:  .  gabapentin (NEURONTIN) 300 MG capsule, Take 300 mg by mouth 3 (three) times daily. , Disp: , Rfl:  .  lisinopril-hydrochlorothiazide (PRINZIDE,ZESTORETIC) 20-12.5 MG per tablet, Take 1 tablet by mouth 2 (two) times daily., Disp: , Rfl:  .  MELATONIN PO, Take by mouth as needed., Disp: , Rfl:  .  methocarbamol (ROBAXIN) 500 MG tablet, TAKE 1 TABLET BY MOUTH EVERY 6 TO 8 HOURS AS NEEDED SPASM, Disp: , Rfl:  .  metoprolol (LOPRESSOR) 100 MG tablet, Take 100 mg by mouth 2 (two) times daily. , Disp: , Rfl:  .  Probiotic Product (PROBIOTIC DAILY PO), Take by mouth daily. Patient states that she takes 2-4 per day., Disp: , Rfl:  .  simvastatin (ZOCOR) 20 MG tablet, Take 10 mg by mouth every evening. , Disp: , Rfl:    Social History: Reviewed -  reports that she has never smoked. She has never used smokeless tobacco.  Objective Findings:  Vitals: Blood pressure 138/81, pulse 76, height 5' 5.5" (1.664 m), weight 213 lb 12.8 oz (97 kg).  PHYSICAL EXAMINATION General appearance - alert, well appearing, and in no distress and oriented to person, place, and time Mental status - alert, oriented to person, place, and time, normal mood, behavior, speech, dress, motor activity, and thought processes Breasts - breasts appear normal, no suspicious masses, no skin or nipple changes or axillary nodes, right breast normal without mass, skin or nipple changes or axillary nodes, left breast normal without mass, skin or nipple changes or axillary nodes, risk and benefit of breast self-exam was discussed Skin - normal coloration and turgor, no rashes, no suspicious skin lesions noted  PELVIC External genitalia - skin tag to left inguinal crease, patient wants removed.  Vagina - well supported, no stiffness. Secretions appear normal.  Cuff well supported. Bimanual exam normal  Cervix - surgically absent Uterus - surgically  absent Adnexa - surgically absent  Rectal - normal rectal, no masses, guaiac negative stool obtained  Assessment & Plan:   A:  1. Normal GYN exam s/p hysterectomy BSO 2. Normal vaginal discharge 3. Skin tag left inguinal crease 4. Obesity: Reduced activity status post October back surgery 5. Family stress due to husband's "bitterness "over family issues  P:  1. Follow up in 1 month for skin  tag removal.  Under local anesthesia 2. Advised that the patient increase physical activity.  3. Patient and husband to continue working on his anger and bitterness, over family issues advised to see Tom Hanks/Mr. Rogers movie.    By signing my name below, I, Soijett Blue, attest that this documentation has been prepared under the direction and in the presence of Jonnie Kind, MD. Electronically Signed: Soijett Blue, Presenter, broadcasting. 02/04/18. 9:51 AM.  I personally performed the services described in this documentation, which was SCRIBED in my presence. The recorded information has been reviewed and considered accurate. It has been edited as necessary during review. Jonnie Kind, MD

## 2018-02-08 DIAGNOSIS — S134XXA Sprain of ligaments of cervical spine, initial encounter: Secondary | ICD-10-CM | POA: Diagnosis not present

## 2018-02-08 DIAGNOSIS — M9903 Segmental and somatic dysfunction of lumbar region: Secondary | ICD-10-CM | POA: Diagnosis not present

## 2018-02-08 DIAGNOSIS — M9902 Segmental and somatic dysfunction of thoracic region: Secondary | ICD-10-CM | POA: Diagnosis not present

## 2018-02-08 DIAGNOSIS — M9901 Segmental and somatic dysfunction of cervical region: Secondary | ICD-10-CM | POA: Diagnosis not present

## 2018-02-08 DIAGNOSIS — M546 Pain in thoracic spine: Secondary | ICD-10-CM | POA: Diagnosis not present

## 2018-02-08 DIAGNOSIS — M47816 Spondylosis without myelopathy or radiculopathy, lumbar region: Secondary | ICD-10-CM | POA: Diagnosis not present

## 2018-02-15 DIAGNOSIS — M5136 Other intervertebral disc degeneration, lumbar region: Secondary | ICD-10-CM | POA: Diagnosis not present

## 2018-02-15 DIAGNOSIS — M5417 Radiculopathy, lumbosacral region: Secondary | ICD-10-CM | POA: Diagnosis not present

## 2018-02-15 DIAGNOSIS — M549 Dorsalgia, unspecified: Secondary | ICD-10-CM | POA: Diagnosis not present

## 2018-02-18 DIAGNOSIS — M9902 Segmental and somatic dysfunction of thoracic region: Secondary | ICD-10-CM | POA: Diagnosis not present

## 2018-02-18 DIAGNOSIS — M9903 Segmental and somatic dysfunction of lumbar region: Secondary | ICD-10-CM | POA: Diagnosis not present

## 2018-02-18 DIAGNOSIS — M546 Pain in thoracic spine: Secondary | ICD-10-CM | POA: Diagnosis not present

## 2018-02-18 DIAGNOSIS — M9901 Segmental and somatic dysfunction of cervical region: Secondary | ICD-10-CM | POA: Diagnosis not present

## 2018-02-18 DIAGNOSIS — M47816 Spondylosis without myelopathy or radiculopathy, lumbar region: Secondary | ICD-10-CM | POA: Diagnosis not present

## 2018-02-18 DIAGNOSIS — S134XXA Sprain of ligaments of cervical spine, initial encounter: Secondary | ICD-10-CM | POA: Diagnosis not present

## 2018-02-25 DIAGNOSIS — M9901 Segmental and somatic dysfunction of cervical region: Secondary | ICD-10-CM | POA: Diagnosis not present

## 2018-02-25 DIAGNOSIS — M47816 Spondylosis without myelopathy or radiculopathy, lumbar region: Secondary | ICD-10-CM | POA: Diagnosis not present

## 2018-02-25 DIAGNOSIS — M9902 Segmental and somatic dysfunction of thoracic region: Secondary | ICD-10-CM | POA: Diagnosis not present

## 2018-02-25 DIAGNOSIS — S134XXA Sprain of ligaments of cervical spine, initial encounter: Secondary | ICD-10-CM | POA: Diagnosis not present

## 2018-02-25 DIAGNOSIS — M546 Pain in thoracic spine: Secondary | ICD-10-CM | POA: Diagnosis not present

## 2018-02-25 DIAGNOSIS — M9903 Segmental and somatic dysfunction of lumbar region: Secondary | ICD-10-CM | POA: Diagnosis not present

## 2018-03-01 DIAGNOSIS — M47816 Spondylosis without myelopathy or radiculopathy, lumbar region: Secondary | ICD-10-CM | POA: Diagnosis not present

## 2018-03-01 DIAGNOSIS — M9902 Segmental and somatic dysfunction of thoracic region: Secondary | ICD-10-CM | POA: Diagnosis not present

## 2018-03-01 DIAGNOSIS — M546 Pain in thoracic spine: Secondary | ICD-10-CM | POA: Diagnosis not present

## 2018-03-01 DIAGNOSIS — M9901 Segmental and somatic dysfunction of cervical region: Secondary | ICD-10-CM | POA: Diagnosis not present

## 2018-03-01 DIAGNOSIS — M9903 Segmental and somatic dysfunction of lumbar region: Secondary | ICD-10-CM | POA: Diagnosis not present

## 2018-03-01 DIAGNOSIS — S134XXA Sprain of ligaments of cervical spine, initial encounter: Secondary | ICD-10-CM | POA: Diagnosis not present

## 2018-03-05 ENCOUNTER — Other Ambulatory Visit (HOSPITAL_COMMUNITY): Payer: Self-pay | Admitting: Physician Assistant

## 2018-03-05 ENCOUNTER — Other Ambulatory Visit: Payer: Self-pay | Admitting: Physician Assistant

## 2018-03-05 DIAGNOSIS — M4807 Spinal stenosis, lumbosacral region: Secondary | ICD-10-CM

## 2018-03-08 ENCOUNTER — Ambulatory Visit (INDEPENDENT_AMBULATORY_CARE_PROVIDER_SITE_OTHER): Payer: Medicare Other | Admitting: Obstetrics and Gynecology

## 2018-03-08 ENCOUNTER — Encounter: Payer: Self-pay | Admitting: Obstetrics and Gynecology

## 2018-03-08 VITALS — BP 137/68 | HR 62 | Ht 65.5 in | Wt 213.0 lb

## 2018-03-08 DIAGNOSIS — N9089 Other specified noninflammatory disorders of vulva and perineum: Secondary | ICD-10-CM | POA: Diagnosis not present

## 2018-03-08 NOTE — Progress Notes (Signed)
Patient ID: [name], female   DOB: [birth year], 68 y.o.   MRN: [id number]   [facility] Visit  @DATE @            Patient name: [name] MRN [id number]  Date of birth: August 05, 1950  CC & HPI:  [name] is a 68 y.o. female presenting today for groin skin tag removal. Says she takes a fingernail file to file them down. She uses aloe lotion in groin daily and constantly tries to keep groin area clean. She is not worried that they are cancerous but are bothered by them.   Follow-up domestic issues: Has not taken her husband to see Tom Hanks/ Mr. Stann Mainland movie due to husbands work schedule  ROS:  ROS +left inguinal skin tags 3 mm in size -fever  All systems are negative except as noted in the HPI and PMH.    Pertinent History Reviewed:   Reviewed: Significant for  Medical         Past Medical History:  Diagnosis Date  . Anxiety   . Back pain   . Cystitides, interstitial, chronic   . Depression   . Fibromyalgia   . GERD (gastroesophageal reflux disease)   . High cholesterol   . Hypertension   . Shortness of breath   . Sleep apnea   . Urinary disorder    bladder is in sling per pt  . Urinary tract infection   . Vertigo                               Surgical Hx:    Past Surgical History:  Procedure Laterality Date  . ABDOMINAL HYSTERECTOMY  1997   [place] [facility] [place] [facility]  10/20/2017  . bladder tack    . COLONOSCOPY N/A 08/23/2014   Procedure: COLONOSCOPY;  Surgeon: Rogene Houston, MD;  [facility];  Service: Endoscopy;  Laterality: N/A;  1200  . KNEE SURGERY Left 05/26/14   meniscus tear  . LAPAROSCOPIC SALPINGO OOPHERECTOMY  02/17/2012   Procedure: LAPAROSCOPIC SALPINGO OOPHORECTOMY;  Surgeon: Jonnie Kind, MD;  Location: [facility];  Service: Gynecology;  Laterality: Bilateral;  . LASIK    . TOE SURGERY     Medications: Reviewed & Updated - see associated section                       Current Outpatient Medications:   .  acetaminophen (TYLENOL) 500 MG tablet, Take 500 mg by mouth as needed., Disp: , Rfl:  .  ALPRAZolam (XANAX) 0.5 MG tablet, Take 0.5 mg by mouth 2 (two) times daily. )5 West Progression Recent Vital Signs  @VS @   Past Medical History: No date: Anxiety No date: Back pain No date: Cystitides, interstitial, chronic No date: Depression No date: Fibromyalgia No date: GERD (gastroesophageal reflux disease) No date: High cholesterol No date: Hypertension No date: Shortness of breath No date: Sleep apnea No date: Urinary disorder     Comment:  bladder is in sling per pt No date: Urinary tract infection No date: Vertigo   Expected Discharge Date    Diet Order    None      VTE Documentation     Work Intensity Score/Level of Care    @LEVELOFCARE @   Mobility       Significant Events  Takes 0.5 mg in the am and 1 tab qhs, Disp: ,  Rfl: 5 .  amLODipine (NORVASC) 5 MG tablet, Take 2.5 mg by mouth daily. , Disp: , Rfl:  .  bisacodyl (DULCOLAX) 10 MG suppository, Place 1 suppository (10 mg total) rectally daily as needed for moderate constipation., Disp: 12 suppository, Rfl: 0 .  cyclobenzaprine (FLEXERIL) 10 MG tablet, Take 1 tablet (10 mg total) by mouth 3 (three) times daily. (Patient taking differently: Take 10 mg by mouth as needed. ), Disp: 6 tablet, Rfl: 0 .  DULoxetine (CYMBALTA) 60 MG capsule, Take 1 capsule (60 mg total) by mouth 2 (two) times daily. (Patient taking differently: Take 60 mg by mouth daily. ), Disp: 60 capsule, Rfl: 2 .  fluticasone (FLONASE) 50 MCG/ACT nasal spray, Place 1 spray into both nostrils as needed. , Disp: , Rfl:  .  gabapentin (NEURONTIN) 300 MG capsule, Take 300 mg by mouth 3 (three) times daily. , Disp: , Rfl:  .  lisinopril-hydrochlorothiazide (PRINZIDE,ZESTORETIC) 20-12.5 MG per tablet, Take 1 tablet by mouth 2 (two) times daily., Disp: , Rfl:  .  MELATONIN PO, Take by mouth as needed., Disp: , Rfl:  .  methocarbamol (ROBAXIN) 500 MG tablet, TAKE 1 TABLET BY MOUTH EVERY 6 TO 8  HOURS AS NEEDED SPASM, Disp: , Rfl:  .  metoprolol (LOPRESSOR) 100 MG tablet, Take 100 mg by mouth 2 (two) times daily. , Disp: , Rfl:  .  Probiotic Product (PROBIOTIC DAILY PO), Take by mouth daily. Patient states that she takes 2-4 per day., Disp: , Rfl:  .  simvastatin (ZOCOR) 20 MG tablet, Take 10 mg by mouth every evening. , Disp: , Rfl:    Social History: Reviewed -  reports that she has never smoked. She has never used smokeless tobacco.  Objective Findings:  Vitals: Blood pressure 137/68, pulse 62, height 5' 5.5" (1.664 m), weight 213 lb (96.6 kg).  PHYSICAL EXAMINATION General appearance - alert, well appearing, and in no distress and oriented to person, place, and time Mental status - normal mood, behavior, speech, dress, motor activity, and thought processes, affect appropriate to mood  PELVIC External genitalia - Skin tag inguinal crease, 3 mm firm nodule in left inguinal crease and skin fibroma.,  2 smaller skin tags on the right inner thigh Cervix - surgically absent Uterus - surgically absent Adnexa - surgically absent  Skin Tag removal Skin cleaned with alcohol swap. 3 cc 1% lidocaine used in 25 gauge needle with cryo spray to aid in removal of skin tag.  Assessment & Plan:   A:  1. Skin tags, removed x3  P:  1.  PRN    By signing my name below, I, Samul Dada, attest that this documentation has been prepared under the direction and in the presence of Jonnie Kind, MD. Electronically Signed: [place]. 03/08/18. 9:47 AM.  I personally performed the services described in this documentation, which was SCRIBED in my presence. The recorded information has been reviewed and considered accurate. It has been edited as necessary during review. Jonnie Kind, MD

## 2018-03-10 ENCOUNTER — Ambulatory Visit (HOSPITAL_COMMUNITY)
Admission: RE | Admit: 2018-03-10 | Discharge: 2018-03-10 | Disposition: A | Discharge status: Home / Self Care | Payer: Medicare Other | Source: Ambulatory Visit | Attending: Physician Assistant | Admitting: Physician Assistant

## 2018-03-10 DIAGNOSIS — M4807 Spinal stenosis, lumbosacral region: Secondary | ICD-10-CM | POA: Insufficient documentation

## 2018-03-10 DIAGNOSIS — M545 Low back pain: Secondary | ICD-10-CM | POA: Diagnosis not present

## 2018-03-18 DIAGNOSIS — M546 Pain in thoracic spine: Secondary | ICD-10-CM | POA: Diagnosis not present

## 2018-03-18 DIAGNOSIS — M47816 Spondylosis without myelopathy or radiculopathy, lumbar region: Secondary | ICD-10-CM | POA: Diagnosis not present

## 2018-03-18 DIAGNOSIS — M9901 Segmental and somatic dysfunction of cervical region: Secondary | ICD-10-CM | POA: Diagnosis not present

## 2018-03-18 DIAGNOSIS — S134XXA Sprain of ligaments of cervical spine, initial encounter: Secondary | ICD-10-CM | POA: Diagnosis not present

## 2018-03-18 DIAGNOSIS — M9902 Segmental and somatic dysfunction of thoracic region: Secondary | ICD-10-CM | POA: Diagnosis not present

## 2018-03-18 DIAGNOSIS — M9903 Segmental and somatic dysfunction of lumbar region: Secondary | ICD-10-CM | POA: Diagnosis not present

## 2018-03-22 DIAGNOSIS — M546 Pain in thoracic spine: Secondary | ICD-10-CM | POA: Diagnosis not present

## 2018-03-22 DIAGNOSIS — M9902 Segmental and somatic dysfunction of thoracic region: Secondary | ICD-10-CM | POA: Diagnosis not present

## 2018-03-22 DIAGNOSIS — M9903 Segmental and somatic dysfunction of lumbar region: Secondary | ICD-10-CM | POA: Diagnosis not present

## 2018-03-22 DIAGNOSIS — S134XXA Sprain of ligaments of cervical spine, initial encounter: Secondary | ICD-10-CM | POA: Diagnosis not present

## 2018-03-22 DIAGNOSIS — M47816 Spondylosis without myelopathy or radiculopathy, lumbar region: Secondary | ICD-10-CM | POA: Diagnosis not present

## 2018-03-22 DIAGNOSIS — M9901 Segmental and somatic dysfunction of cervical region: Secondary | ICD-10-CM | POA: Diagnosis not present

## 2018-03-25 DIAGNOSIS — M9901 Segmental and somatic dysfunction of cervical region: Secondary | ICD-10-CM | POA: Diagnosis not present

## 2018-03-25 DIAGNOSIS — M546 Pain in thoracic spine: Secondary | ICD-10-CM | POA: Diagnosis not present

## 2018-03-25 DIAGNOSIS — M9902 Segmental and somatic dysfunction of thoracic region: Secondary | ICD-10-CM | POA: Diagnosis not present

## 2018-03-25 DIAGNOSIS — M9903 Segmental and somatic dysfunction of lumbar region: Secondary | ICD-10-CM | POA: Diagnosis not present

## 2018-03-25 DIAGNOSIS — M47816 Spondylosis without myelopathy or radiculopathy, lumbar region: Secondary | ICD-10-CM | POA: Diagnosis not present

## 2018-03-25 DIAGNOSIS — S134XXA Sprain of ligaments of cervical spine, initial encounter: Secondary | ICD-10-CM | POA: Diagnosis not present

## 2018-03-28 ENCOUNTER — Emergency Department (HOSPITAL_COMMUNITY)
Admission: EM | Admit: 2018-03-28 | Discharge: 2018-03-28 | Disposition: A | Discharge status: Home / Self Care | Payer: Medicare Other | Attending: Emergency Medicine | Admitting: Emergency Medicine

## 2018-03-28 ENCOUNTER — Other Ambulatory Visit: Payer: Self-pay

## 2018-03-28 ENCOUNTER — Encounter (HOSPITAL_COMMUNITY): Payer: Self-pay | Admitting: Emergency Medicine

## 2018-03-28 DIAGNOSIS — Z79899 Other long term (current) drug therapy: Secondary | ICD-10-CM | POA: Insufficient documentation

## 2018-03-28 DIAGNOSIS — M549 Dorsalgia, unspecified: Secondary | ICD-10-CM | POA: Diagnosis present

## 2018-03-28 DIAGNOSIS — M5442 Lumbago with sciatica, left side: Secondary | ICD-10-CM | POA: Diagnosis not present

## 2018-03-28 DIAGNOSIS — M5440 Lumbago with sciatica, unspecified side: Secondary | ICD-10-CM | POA: Diagnosis not present

## 2018-03-28 DIAGNOSIS — I10 Essential (primary) hypertension: Secondary | ICD-10-CM | POA: Diagnosis not present

## 2018-03-28 MED ORDER — PANTOPRAZOLE SODIUM 20 MG PO TBEC: 20.0000 mg | DELAYED_RELEASE_TABLET | Freq: Every day | ORAL | 0 refills | Status: DC

## 2018-03-28 MED ORDER — HYDROMORPHONE HCL 1 MG/ML IJ SOLN
1.0000 mg | Freq: Once | INTRAMUSCULAR | Status: AC
  Administered 2018-03-28: 1 mg via INTRAVENOUS
  Filled 2018-03-28: qty 1

## 2018-03-28 MED ORDER — ONDANSETRON HCL 4 MG/2ML IJ SOLN
4.0000 mg | Freq: Once | INTRAMUSCULAR | Status: AC
Start: 2018-03-28 — End: 2018-03-28
  Administered 2018-03-28: 4 mg via INTRAVENOUS
  Filled 2018-03-28: qty 2

## 2018-03-28 MED ORDER — HYDROCODONE-ACETAMINOPHEN 5-325 MG PO TABS: 1.0000 | ORAL_TABLET | Freq: Four times a day (QID) | ORAL | 0 refills | Status: DC | PRN

## 2018-03-28 MED ORDER — PREDNISONE 10 MG PO TABS: 20.0000 mg | ORAL_TABLET | Freq: Every day | ORAL | 0 refills | Status: DC

## 2018-03-28 MED ORDER — METHYLPREDNISOLONE SODIUM SUCC 125 MG IJ SOLR
125.0000 mg | Freq: Once | INTRAMUSCULAR | Status: AC
  Administered 2018-03-28: 125 mg via INTRAVENOUS
  Filled 2018-03-28: qty 2

## 2018-03-28 NOTE — ED Triage Notes (Addendum)
Patient c/o left lower back pain that radiates into left leg and knee for "a long time.". Patient seen by spine specialist and referred to [facility]. Per patient has had MRI and is seeing a doctor at [facility], waiting for results. Patient states had back surgery for ruptured disc and stenosis in October 2019. Per patient taking tylenol with no relief. Patient reports numbness but states has neuropathy, patient also states urinary incontinency but states she has cystitis.

## 2018-03-28 NOTE — ED Provider Notes (Signed)
[facility] EMERGENCY DEPARTMENT Provider Note   CSN: [id number] Arrival date & time: 03/28/18  1050    History   Chief Complaint Chief Complaint  Patient presents with  . Back Pain    HPI [name] is a 68 y.o. female.     Patient complains of back pain.  The pain radiates down the left leg.  She has had previous surgery in her back by neurosurgery at [facility].  Patient has had a MRI of her back recently  The history is provided by the patient.  Back Pain  Location:  Generalized Quality:  Aching Radiates to:  L posterior upper leg Pain severity:  Moderate Pain is:  Same all the time Onset quality:  Sudden Timing:  Constant Progression:  Worsening Chronicity:  New Context: not emotional stress   Relieved by:  Nothing Worsened by:  Nothing Ineffective treatments:  None tried Associated symptoms: no abdominal pain, no chest pain and no headaches     Past Medical History:  Diagnosis Date  . Anxiety   . Back pain   . Cystitides, interstitial, chronic   . Depression   . Fibromyalgia   . GERD (gastroesophageal reflux disease)   . High cholesterol   . Hypertension   . Shortness of breath   . Sleep apnea   . Urinary disorder    bladder is in sling per pt  . Urinary tract infection   . Vertigo     Patient Active Problem List   Diagnosis Date Noted  . Vasomotor instability 02/26/2015  . Dizziness and giddiness 08/14/2014  . Gait difficulty 08/14/2014  . Depression 03/01/2014    Past Surgical History:  Procedure Laterality Date  . ABDOMINAL HYSTERECTOMY  1997   [place] [facility] [place] [facility]  10/20/2017  . bladder tack    . COLONOSCOPY N/A 08/23/2014   Procedure: COLONOSCOPY;  Surgeon: Rogene Houston, MD;  [facility];  Service: Endoscopy;  Laterality: N/A;  1200  . KNEE SURGERY Left 05/26/14   meniscus tear  . LAPAROSCOPIC SALPINGO OOPHERECTOMY  02/17/2012   Procedure: LAPAROSCOPIC SALPINGO OOPHORECTOMY;  Surgeon: Jonnie Kind,  MD;  Location: [facility];  Service: Gynecology;  Laterality: Bilateral;  . LASIK    . TOE SURGERY       OB History    Gravida  4   Para  3   Term  3   Preterm      AB  1   Living  3     SAB  1   TAB      Ectopic      Multiple      Live Births  3            Home Medications    Prior to Admission medications   Medication Sig Start Date End Date Taking? Authorizing Provider  acetaminophen (TYLENOL) 500 MG tablet Take 500 mg by mouth as needed.    [provider]  ALPRAZolam Duanne Moron) 0.5 MG tablet Take 0.5 mg by mouth 2 (two) times daily. )5 [place] Progression Recent Vital Signs  @VS @   Past Medical History: No date: Anxiety No date: Back pain No date: Cystitides, interstitial, chronic No date: Depression No date: Fibromyalgia No date: GERD (gastroesophageal reflux disease) No date: High cholesterol No date: Hypertension No date: Shortness of breath No date: Sleep apnea No date: Urinary disorder     Comment:  bladder is in sling per pt No date: Urinary tract  infection No date: Vertigo   Expected Discharge Date    Diet Order    None      VTE Documentation     Work Intensity Score/Level of Care    @LEVELOFCARE @   Mobility       Significant Events  Takes 0.5 mg in the am and 1 tab qhs 08/13/17   [provider]  amLODipine (NORVASC) 5 MG tablet Take 2.5 mg by mouth daily.     [provider]  bisacodyl (DULCOLAX) 10 MG suppository Place 1 suppository (10 mg total) rectally daily as needed for moderate constipation. 09/11/15   Rogene Houston, MD  cyclobenzaprine (FLEXERIL) 10 MG tablet Take 1 tablet (10 mg total) by mouth 3 (three) times daily. Patient taking differently: Take 10 mg by mouth as needed.  08/25/17   Lily Kocher, PA-C  DULoxetine (CYMBALTA) 60 MG capsule Take 1 capsule (60 mg total) by mouth 2 (two) times daily. Patient taking differently: Take 60 mg by mouth daily.  08/01/16   Cloria Spring, MD   fluticasone (FLONASE) 50 MCG/ACT nasal spray Place 1 spray into both nostrils as needed.     [provider]  gabapentin (NEURONTIN) 300 MG capsule Take 300 mg by mouth 3 (three) times daily.     [provider]  HYDROcodone-acetaminophen (NORCO/VICODIN) 5-325 MG tablet Take 1 tablet by mouth every 6 (six) hours as needed. 03/28/18   Milton Ferguson, MD  lisinopril-hydrochlorothiazide (PRINZIDE,ZESTORETIC) 20-12.5 MG per tablet Take 1 tablet by mouth 2 (two) times daily.    [provider]  MELATONIN PO Take by mouth as needed.    [provider]  methocarbamol (ROBAXIN) 500 MG tablet TAKE 1 TABLET BY MOUTH EVERY 6 TO 8 HOURS AS NEEDED SPASM 12/23/17   [provider]  metoprolol (LOPRESSOR) 100 MG tablet Take 100 mg by mouth 2 (two) times daily.     [provider]  pantoprazole (PROTONIX) 20 MG tablet Take 1 tablet (20 mg total) by mouth daily. 03/28/18   Milton Ferguson, MD  predniSONE (DELTASONE) 10 MG tablet Take 2 tablets (20 mg total) by mouth daily. 03/28/18   Milton Ferguson, MD  Probiotic Product (PROBIOTIC DAILY PO) Take by mouth daily. Patient states that she takes 2-4 per day.    [provider]  simvastatin (ZOCOR) 20 MG tablet Take 10 mg by mouth every evening.     [provider]    Family History Family History  Problem Relation Age of Onset  . Atrial fibrillation Mother   . COPD Mother   . Hypertension Mother   . Hypertension Father   . Cancer Father        throat   . Drug abuse Brother   . Alcohol abuse Brother   . Early death Brother   . Fibromyalgia Daughter   . Paranoid behavior Son   . Alcohol abuse Son   . Hypertension Brother   . Sleep apnea Brother   . Alcohol abuse Brother   . Drug abuse Brother     Social History Social History   Tobacco Use  . Smoking status: Never Smoker  . Smokeless tobacco: Never Used  Substance Use Topics  . Alcohol use: No    Alcohol/week: 0.0 standard drinks     Comment: occassionalym, pt denies any use   . Drug use: No     Allergies   Lyrica [pregabalin]   Review of Systems Review of Systems  Constitutional: Negative for appetite  change and fatigue.  HENT: Negative for congestion, ear discharge and sinus pressure.   Eyes: Negative for discharge.  Respiratory: Negative for cough.   Cardiovascular: Negative for chest pain.  Gastrointestinal: Negative for abdominal pain and diarrhea.  Genitourinary: Negative for frequency and hematuria.  Musculoskeletal: Positive for back pain.  Skin: Negative for rash.  Neurological: Negative for seizures and headaches.  Psychiatric/Behavioral: Negative for hallucinations.     Physical Exam Updated Vital Signs BP 136/69   Pulse 65   Temp 98 F (36.7 C) (Oral)   Resp 20   Ht 5' 5.5" (1.664 m)   Wt 94.3 kg   SpO2 96%   BMI 34.09 kg/m   Physical Exam Vitals signs and nursing note reviewed.  Constitutional:      Appearance: She is well-developed.  HENT:     Head: Normocephalic.     Nose: Nose normal.  Eyes:     General: No scleral icterus.    Conjunctiva/sclera: Conjunctivae normal.  Neck:     Musculoskeletal: Neck supple.     Thyroid: No thyromegaly.  Cardiovascular:     Rate and Rhythm: Normal rate and regular rhythm.     Heart sounds: No murmur. No friction rub. No gallop.   Pulmonary:     Breath sounds: No stridor. No wheezing or rales.  Chest:     Chest wall: No tenderness.  Abdominal:     General: There is no distension.     Tenderness: There is no abdominal tenderness. There is no rebound.  Musculoskeletal: Normal range of motion.     Comments: Tender lumbar spine with positive straight leg raise on left  Lymphadenopathy:     Cervical: No cervical adenopathy.  Skin:    Findings: No erythema or rash.  Neurological:     Mental Status: She is alert and oriented to person, place, and time.     Motor: No abnormal muscle tone.     Coordination: Coordination normal.   Psychiatric:        Behavior: Behavior normal.      ED Treatments / Results  Labs (all labs ordered are listed, but only abnormal results are displayed) Labs Reviewed - No data to display  EKG None  Radiology No results found.  Procedures Procedures (including critical care time)  Medications Ordered in ED Medications  HYDROmorphone (DILAUDID) injection 1 mg (1 mg Intravenous Given 03/28/18 1405)  ondansetron (ZOFRAN) injection 4 mg (4 mg Intravenous Given 03/28/18 1404)  methylPREDNISolone sodium succinate (SOLU-MEDROL) 125 mg/2 mL injection 125 mg (125 mg Intravenous Given 03/28/18 1402)  HYDROmorphone (DILAUDID) injection 1 mg (1 mg Intravenous Given 03/28/18 1450)     Initial Impression / Assessment and Plan / ED Course  I have reviewed the triage vital signs and the nursing notes.  Pertinent labs & imaging results that were available during my care of the patient were reviewed by me and considered in my medical decision making (see chart for details).        Back pain and sciatica.  Patient improved with pain medicine and steroids.  She will be sent home with steroids protonic pain medicine and referred back to her neurosurgeon or [place] neurosurgery  Final Clinical Impressions(s) / ED Diagnoses   Final diagnoses:  Acute left-sided low back pain with sciatica, sciatica laterality unspecified    ED Discharge Orders         Ordered    pantoprazole (PROTONIX) 20 MG tablet  Daily     03/28/18 1626  predniSONE (DELTASONE) 10 MG tablet  Daily     03/28/18 1626    HYDROcodone-acetaminophen (NORCO/VICODIN) 5-325 MG tablet  Every 6 hours PRN     03/28/18 1626           Milton Ferguson, MD 03/28/18 1643

## 2018-03-28 NOTE — Discharge Instructions (Signed)
Follow up with [place] neuro[facility] or your neurosurgeon in 1-3 weeks

## 2018-03-28 NOTE — ED Notes (Signed)
Pt reports previous surgery by Cassie Freer Referral to [facility]- eval there has not gone as planned  Reports "Excruciating pain" for 6 weeks   Here for pain relief

## 2018-03-28 NOTE — ED Notes (Signed)
Sitting in chair   NAD  Awaiting eval

## 2018-03-28 NOTE — ED Notes (Signed)
Pt continues to report her beck evaluation experiences   Reports pain that is not controlled   Nurse asks if pt has considered a pain clinic- she has not   She then asks nure regarding whether or not she has an opinions regarding C pap machines/surgery Pt is encouraged to examine that question with her physician

## 2018-03-28 NOTE — ED Notes (Signed)
Pt not in room.

## 2018-03-29 DIAGNOSIS — M9903 Segmental and somatic dysfunction of lumbar region: Secondary | ICD-10-CM | POA: Diagnosis not present

## 2018-03-29 DIAGNOSIS — M47816 Spondylosis without myelopathy or radiculopathy, lumbar region: Secondary | ICD-10-CM | POA: Diagnosis not present

## 2018-03-29 DIAGNOSIS — M546 Pain in thoracic spine: Secondary | ICD-10-CM | POA: Diagnosis not present

## 2018-03-29 DIAGNOSIS — S134XXA Sprain of ligaments of cervical spine, initial encounter: Secondary | ICD-10-CM | POA: Diagnosis not present

## 2018-03-29 DIAGNOSIS — M9902 Segmental and somatic dysfunction of thoracic region: Secondary | ICD-10-CM | POA: Diagnosis not present

## 2018-03-29 DIAGNOSIS — M9901 Segmental and somatic dysfunction of cervical region: Secondary | ICD-10-CM | POA: Diagnosis not present

## 2018-03-31 DIAGNOSIS — S134XXA Sprain of ligaments of cervical spine, initial encounter: Secondary | ICD-10-CM | POA: Diagnosis not present

## 2018-03-31 DIAGNOSIS — M47816 Spondylosis without myelopathy or radiculopathy, lumbar region: Secondary | ICD-10-CM | POA: Diagnosis not present

## 2018-03-31 DIAGNOSIS — M9903 Segmental and somatic dysfunction of lumbar region: Secondary | ICD-10-CM | POA: Diagnosis not present

## 2018-03-31 DIAGNOSIS — M546 Pain in thoracic spine: Secondary | ICD-10-CM | POA: Diagnosis not present

## 2018-03-31 DIAGNOSIS — M9901 Segmental and somatic dysfunction of cervical region: Secondary | ICD-10-CM | POA: Diagnosis not present

## 2018-03-31 DIAGNOSIS — M9902 Segmental and somatic dysfunction of thoracic region: Secondary | ICD-10-CM | POA: Diagnosis not present

## 2018-04-08 DIAGNOSIS — M546 Pain in thoracic spine: Secondary | ICD-10-CM | POA: Diagnosis not present

## 2018-04-08 DIAGNOSIS — S134XXA Sprain of ligaments of cervical spine, initial encounter: Secondary | ICD-10-CM | POA: Diagnosis not present

## 2018-04-08 DIAGNOSIS — M9902 Segmental and somatic dysfunction of thoracic region: Secondary | ICD-10-CM | POA: Diagnosis not present

## 2018-04-08 DIAGNOSIS — M47816 Spondylosis without myelopathy or radiculopathy, lumbar region: Secondary | ICD-10-CM | POA: Diagnosis not present

## 2018-04-08 DIAGNOSIS — M9901 Segmental and somatic dysfunction of cervical region: Secondary | ICD-10-CM | POA: Diagnosis not present

## 2018-04-08 DIAGNOSIS — M9903 Segmental and somatic dysfunction of lumbar region: Secondary | ICD-10-CM | POA: Diagnosis not present

## 2018-04-09 DIAGNOSIS — M5416 Radiculopathy, lumbar region: Secondary | ICD-10-CM | POA: Diagnosis not present

## 2018-04-12 DIAGNOSIS — E559 Vitamin D deficiency, unspecified: Secondary | ICD-10-CM | POA: Diagnosis not present

## 2018-04-12 DIAGNOSIS — R635 Abnormal weight gain: Secondary | ICD-10-CM | POA: Diagnosis not present

## 2018-04-12 DIAGNOSIS — I10 Essential (primary) hypertension: Secondary | ICD-10-CM | POA: Diagnosis not present

## 2018-04-12 DIAGNOSIS — R5383 Other fatigue: Secondary | ICD-10-CM | POA: Diagnosis not present

## 2018-04-12 DIAGNOSIS — E782 Mixed hyperlipidemia: Secondary | ICD-10-CM | POA: Diagnosis not present

## 2018-04-13 DIAGNOSIS — M5136 Other intervertebral disc degeneration, lumbar region: Secondary | ICD-10-CM | POA: Insufficient documentation

## 2018-04-13 DIAGNOSIS — M961 Postlaminectomy syndrome, not elsewhere classified: Secondary | ICD-10-CM | POA: Diagnosis not present

## 2018-04-13 DIAGNOSIS — M545 Low back pain: Secondary | ICD-10-CM | POA: Diagnosis not present

## 2018-04-13 DIAGNOSIS — T819XXA Unspecified complication of procedure, initial encounter: Secondary | ICD-10-CM | POA: Insufficient documentation

## 2018-04-13 DIAGNOSIS — M5416 Radiculopathy, lumbar region: Secondary | ICD-10-CM | POA: Diagnosis not present

## 2018-04-14 DIAGNOSIS — M9903 Segmental and somatic dysfunction of lumbar region: Secondary | ICD-10-CM | POA: Diagnosis not present

## 2018-04-14 DIAGNOSIS — M9902 Segmental and somatic dysfunction of thoracic region: Secondary | ICD-10-CM | POA: Diagnosis not present

## 2018-04-14 DIAGNOSIS — M546 Pain in thoracic spine: Secondary | ICD-10-CM | POA: Diagnosis not present

## 2018-04-14 DIAGNOSIS — S134XXA Sprain of ligaments of cervical spine, initial encounter: Secondary | ICD-10-CM | POA: Diagnosis not present

## 2018-04-14 DIAGNOSIS — M9901 Segmental and somatic dysfunction of cervical region: Secondary | ICD-10-CM | POA: Diagnosis not present

## 2018-04-14 DIAGNOSIS — M47816 Spondylosis without myelopathy or radiculopathy, lumbar region: Secondary | ICD-10-CM | POA: Diagnosis not present

## 2018-04-15 DIAGNOSIS — E559 Vitamin D deficiency, unspecified: Secondary | ICD-10-CM | POA: Diagnosis not present

## 2018-04-15 DIAGNOSIS — I10 Essential (primary) hypertension: Secondary | ICD-10-CM | POA: Diagnosis not present

## 2018-04-15 DIAGNOSIS — N3010 Interstitial cystitis (chronic) without hematuria: Secondary | ICD-10-CM | POA: Diagnosis not present

## 2018-04-15 DIAGNOSIS — E782 Mixed hyperlipidemia: Secondary | ICD-10-CM | POA: Diagnosis not present

## 2018-04-15 DIAGNOSIS — M5417 Radiculopathy, lumbosacral region: Secondary | ICD-10-CM | POA: Diagnosis not present

## 2018-04-15 DIAGNOSIS — F339 Major depressive disorder, recurrent, unspecified: Secondary | ICD-10-CM | POA: Insufficient documentation

## 2018-04-15 DIAGNOSIS — F5105 Insomnia due to other mental disorder: Secondary | ICD-10-CM | POA: Diagnosis not present

## 2018-04-15 DIAGNOSIS — M797 Fibromyalgia: Secondary | ICD-10-CM | POA: Diagnosis not present

## 2018-04-15 DIAGNOSIS — M545 Low back pain: Secondary | ICD-10-CM | POA: Diagnosis not present

## 2018-04-15 DIAGNOSIS — M5116 Intervertebral disc disorders with radiculopathy, lumbar region: Secondary | ICD-10-CM | POA: Diagnosis not present

## 2018-04-20 ENCOUNTER — Other Ambulatory Visit: Payer: Self-pay | Admitting: Orthopedic Surgery

## 2018-04-20 DIAGNOSIS — M9901 Segmental and somatic dysfunction of cervical region: Secondary | ICD-10-CM | POA: Diagnosis not present

## 2018-04-20 DIAGNOSIS — M9902 Segmental and somatic dysfunction of thoracic region: Secondary | ICD-10-CM | POA: Diagnosis not present

## 2018-04-20 DIAGNOSIS — S134XXA Sprain of ligaments of cervical spine, initial encounter: Secondary | ICD-10-CM | POA: Diagnosis not present

## 2018-04-20 DIAGNOSIS — M47816 Spondylosis without myelopathy or radiculopathy, lumbar region: Secondary | ICD-10-CM | POA: Diagnosis not present

## 2018-04-20 DIAGNOSIS — M546 Pain in thoracic spine: Secondary | ICD-10-CM | POA: Diagnosis not present

## 2018-04-20 DIAGNOSIS — M9903 Segmental and somatic dysfunction of lumbar region: Secondary | ICD-10-CM | POA: Diagnosis not present

## 2018-04-20 NOTE — Pre-Procedure Instructions (Signed)
Burnett Lieber  04/20/2018      [facility] [place], [place] [address] [place] [address] Phone: [phone] Fax: [phone]    Your procedure is scheduled on Wednesday April 15th.  Report to [facility] Admitting Entrance "A" at 6:30 A.M.  Call this number if you have problems the morning of surgery:  [phone]   Remember:  Do not eat or drink after midnight.     Take these medicines the morning of surgery with A SIP OF WATER  amLODipine (NORVASC) metoprolol (LOPRESSOR)  pantoprazole (PROTONIX)  acetaminophen (TYLENOL) if needed ALPRAZolam Duanne Moron) if needed cyclobenzaprine (FLEXERIL) if needed fluticasone (FLONASE) if needed HYDROcodone-acetaminophen ([place]) if needed Naphazoline-Pheniramine (OPCON-A OP) if needed   7 days prior to surgery STOP taking any Aspirin(unless otherwise instructed by your surgeon), Aleve, Naproxen, Ibuprofen, Motrin, Advil, Goody's, BC's, all herbal medications, fish oil, and all vitamins    Do not wear jewelry, make-up or nail polish.  Do not wear lotions, powders, or perfumes, or deodorant.  Do not shave 48 hours prior to surgery.    Do not bring valuables to the hospital.  [facility] is not responsible for any belongings or valuables.  Contacts, eye glasses, dentures or bridgework may not be worn into surgery.  Leave your suitcase in the car.  After surgery it may be brought to your room.  For patients admitted to the hospital, discharge time will be determined by your treatment team.  Patients discharged the day of surgery will not be allowed to drive home.   [facility]- Preparing For Surgery  Before surgery, you can play an important role. Because skin is not sterile, your skin needs to be as free of germs as possible. You can reduce the number of germs on your skin by washing with CHG (chlorahexidine gluconate) Soap before surgery.  CHG is an antiseptic cleaner which kills germs and  bonds with the skin to continue killing germs even after washing.    Oral Hygiene is also important to reduce your risk of infection.  Remember - BRUSH YOUR TEETH THE MORNING OF SURGERY WITH YOUR REGULAR TOOTHPASTE  Please do not use if you have an allergy to CHG or antibacterial soaps. If your skin becomes reddened/irritated stop using the CHG.  Do not shave (including legs and underarms) for at least 48 hours prior to first CHG shower. It is OK to shave your face.  Please follow these instructions carefully.   1. Shower the NIGHT BEFORE SURGERY and the MORNING OF SURGERY with CHG.   2. If you chose to wash your hair, wash your hair first as usual with your normal shampoo.  3. After you shampoo, rinse your hair and body thoroughly to remove the shampoo.  4. Use CHG as you would any other liquid soap. You can apply CHG directly to the skin and wash gently with a scrungie or a clean washcloth.   5. Apply the CHG Soap to your body ONLY FROM THE NECK DOWN.  Do not use on open wounds or open sores. Avoid contact with your eyes, ears, mouth and genitals (private parts). Wash Face and genitals (private parts)  with your normal soap.  6. Wash thoroughly, paying special attention to the area where your surgery will be performed.  7. Thoroughly rinse your body with warm water from the neck down.  8. DO NOT shower/wash with your normal soap after using and rinsing off the CHG  Soap.  9. Pat yourself dry with a CLEAN TOWEL.  10. Wear CLEAN PAJAMAS to bed the night before surgery, wear comfortable clothes the morning of surgery  11. Place CLEAN SHEETS on your bed the night of your first shower and DO NOT SLEEP WITH PETS.    Day of Surgery: Shower as stated above. Do not apply any deodorants/lotions.  Please wear clean clothes to the hospital/surgery center.   Remember to brush your teeth WITH YOUR REGULAR TOOTHPASTE.   Please read over the following fact sheets that you were  given.

## 2018-04-21 ENCOUNTER — Other Ambulatory Visit: Payer: Self-pay

## 2018-04-21 ENCOUNTER — Encounter (HOSPITAL_COMMUNITY)
Admission: RE | Admit: 2018-04-21 | Discharge: 2018-04-21 | Disposition: A | Discharge status: Home / Self Care | Payer: Medicare Other | Source: Ambulatory Visit | Attending: Orthopedic Surgery | Admitting: Orthopedic Surgery

## 2018-04-21 ENCOUNTER — Encounter (HOSPITAL_COMMUNITY): Payer: Self-pay

## 2018-04-21 DIAGNOSIS — G4733 Obstructive sleep apnea (adult) (pediatric): Secondary | ICD-10-CM | POA: Insufficient documentation

## 2018-04-21 DIAGNOSIS — I10 Essential (primary) hypertension: Secondary | ICD-10-CM | POA: Diagnosis not present

## 2018-04-21 DIAGNOSIS — F329 Major depressive disorder, single episode, unspecified: Secondary | ICD-10-CM | POA: Diagnosis not present

## 2018-04-21 DIAGNOSIS — E669 Obesity, unspecified: Secondary | ICD-10-CM | POA: Diagnosis not present

## 2018-04-21 DIAGNOSIS — R0600 Dyspnea, unspecified: Secondary | ICD-10-CM | POA: Diagnosis not present

## 2018-04-21 DIAGNOSIS — Z79899 Other long term (current) drug therapy: Secondary | ICD-10-CM | POA: Insufficient documentation

## 2018-04-21 DIAGNOSIS — Z01812 Encounter for preprocedural laboratory examination: Secondary | ICD-10-CM | POA: Diagnosis not present

## 2018-04-21 DIAGNOSIS — Z6834 Body mass index (BMI) 34.0-34.9, adult: Secondary | ICD-10-CM | POA: Diagnosis not present

## 2018-04-21 DIAGNOSIS — F419 Anxiety disorder, unspecified: Secondary | ICD-10-CM | POA: Insufficient documentation

## 2018-04-21 DIAGNOSIS — M797 Fibromyalgia: Secondary | ICD-10-CM | POA: Diagnosis not present

## 2018-04-21 DIAGNOSIS — Z7901 Long term (current) use of anticoagulants: Secondary | ICD-10-CM | POA: Diagnosis not present

## 2018-04-21 DIAGNOSIS — M1990 Unspecified osteoarthritis, unspecified site: Secondary | ICD-10-CM | POA: Insufficient documentation

## 2018-04-21 DIAGNOSIS — M109 Gout, unspecified: Secondary | ICD-10-CM | POA: Insufficient documentation

## 2018-04-21 DIAGNOSIS — E7800 Pure hypercholesterolemia, unspecified: Secondary | ICD-10-CM | POA: Insufficient documentation

## 2018-04-21 DIAGNOSIS — K219 Gastro-esophageal reflux disease without esophagitis: Secondary | ICD-10-CM | POA: Insufficient documentation

## 2018-04-21 HISTORY — DX: Presence of spectacles and contact lenses: Z97.3

## 2018-04-21 HISTORY — DX: Gout, unspecified: M10.9

## 2018-04-21 HISTORY — DX: Unspecified cataract: H26.9

## 2018-04-21 HISTORY — DX: Unspecified osteoarthritis, unspecified site: M19.90

## 2018-04-21 LAB — COMPREHENSIVE METABOLIC PANEL
ALT: 30 U/L (ref 0–44)
AST: 24 U/L (ref 15–41)
Albumin: 4 g/dL (ref 3.5–5.0)
Alkaline Phosphatase: 44 U/L (ref 38–126)
Anion gap: 13 (ref 5–15)
BUN: 16 mg/dL (ref 8–23)
CO2: 27 mmol/L (ref 22–32)
Calcium: 10.1 mg/dL (ref 8.9–10.3)
Chloride: 98 mmol/L (ref 98–111)
Creatinine, Ser: 0.88 mg/dL (ref 0.44–1.00)
GFR calc Af Amer: >60 mL/min (ref >60)
GFR calc non Af Amer: >60 mL/min (ref >60)
Glucose, Bld: 110 mg/dL — ABNORMAL HIGH (ref 70–99)
Potassium: 3.5 mmol/L (ref 3.5–5.1)
Sodium: 138 mmol/L (ref 135–145)
Total Bilirubin: 0.4 mg/dL (ref 0.3–1.2)
Total Protein: 6.7 g/dL (ref 6.5–8.1)

## 2018-04-21 LAB — CBC WITH DIFFERENTIAL/PLATELET
Abs Immature Granulocytes: 0.02 10*3/uL (ref 0.00–0.07)
Basophils Absolute: 0.1 10*3/uL (ref 0.0–0.1)
Basophils Relative: 1 %
Eosinophils Absolute: 0.2 10*3/uL (ref 0.0–0.5)
Eosinophils Relative: 3 %
HCT: 39.1 % (ref 36.0–46.0)
Hemoglobin: 12.8 g/dL (ref 12.0–15.0)
Immature Granulocytes: 0 %
Lymphocytes Relative: 33 %
Lymphs Abs: 2.5 10*3/uL (ref 0.7–4.0)
MCH: 29.8 pg (ref 26.0–34.0)
MCHC: 32.7 g/dL (ref 30.0–36.0)
MCV: 90.9 fL (ref 80.0–100.0)
Monocytes Absolute: 0.8 10*3/uL (ref 0.1–1.0)
Monocytes Relative: 11 %
Neutro Abs: 4 10*3/uL (ref 1.7–7.7)
Neutrophils Relative %: 52 %
Platelets: 282 10*3/uL (ref 150–400)
RBC: 4.3 MIL/uL (ref 3.87–5.11)
RDW: 13.1 % (ref 11.5–15.5)
WBC: 7.7 10*3/uL (ref 4.0–10.5)
nRBC: 0 % (ref 0.0–0.2)

## 2018-04-21 LAB — URINALYSIS, ROUTINE W REFLEX MICROSCOPIC
Bacteria, UA: NONE SEEN
Bilirubin Urine: NEGATIVE
Glucose, UA: NEGATIVE mg/dL
Hgb urine dipstick: NEGATIVE
Ketones, ur: NEGATIVE mg/dL
Nitrite: NEGATIVE
Protein, ur: NEGATIVE mg/dL
Specific Gravity, Urine: 1.023 (ref 1.005–1.030)
pH: 5 (ref 5.0–8.0)

## 2018-04-21 LAB — PROTIME-INR
INR: 1 (ref 0.8–1.2)
Prothrombin Time: 13.5 seconds (ref 11.4–15.2)

## 2018-04-21 LAB — TYPE AND SCREEN
ABO/RH(D): A POS
Antibody Screen: NEGATIVE

## 2018-04-21 LAB — ABO/RH: ABO/RH(D): A POS

## 2018-04-21 LAB — SURGICAL PCR SCREEN
MRSA, PCR: NEGATIVE
Staphylococcus aureus: POSITIVE — AB

## 2018-04-21 LAB — APTT: aPTT: 31 seconds (ref 24–36)

## 2018-04-21 NOTE — Progress Notes (Addendum)
Anesthesia Chart Review:  Case:  [id number] Date/Time:  04/28/18 0815   Procedure:  ANTERIOR LATERAL INNER BODY LUMBAR FUSION L3-4, L4-5 2 LEVELS WITH INSTRUMENTATION AND ALLOGRAFT (Right )   Anesthesia type:  Choice   Pre-op diagnosis:  Lateral Inner Body L3-4, L4-5   Location:  [facility] OR ROOM 03 / [place] [facility]   Surgeon:  Phylliss Bob, MD      DISCUSSION: Patient is a 68 year old female scheduled for the above procedure. She is also scheduled for left L3-5 posterior spinal fusion on 04/29/18.  History includes never smoker, HTN, fibromyalgia, hypercholesterolemia, exertional dyspnea, vertigo, OSA (CPAP), exertional dyspnea, L3-4 discectomy 10/20/17. BMI is consistent with obesity.  EKG stable. Labs acceptable for OR.   2017 stress test imaging report summary still pending from [facility], so will leave chart for follow-up.  ADDENDUM 04/27/18 1:56 PM:  Received a copy of 2017 non-ischemic stress test from [facility] (now [facility] Also received a copy of a stress echo on 10/13/16 that showed mild ST depression, but was felt probably normal. Stress echo was done during an admission for chest pain, ruled out MI. She tolerated lumbar discectomy since that time and denied chest pain at her PAT RN visit. If she remains asymptomatic from a CV standpoint and otherwise no acute changed then I would anticipate that she can proceed as planned. Reviewed with anesthesiologist Roberts Gaudy, MD.    VS: BP 138/64   Pulse 63   Temp 36.6 C   Resp 16   Ht 5' 5.5" (1.664 m)   Wt 95.7 kg   LMP  (LMP Unknown)   SpO2 99%   BMI 34.59 kg/m    PROVIDERS: [place], Virgina Evener, MD is PCP in [place]   LABS: Labs reviewed: Acceptable for surgery. PAT RN left message at surgeon's office regarding UA results (moderate leukocytes, negative nitrites). Per Butch Penny, surgeon has started patient on an antibiotic for abnormal UA. (all labs ordered are listed, but only abnormal results are displayed)  Labs Reviewed  SURGICAL  PCR SCREEN - Abnormal; Notable for the following components:      Result Value   Staphylococcus aureus POSITIVE (*)    All other components within normal limits  COMPREHENSIVE METABOLIC PANEL - Abnormal; Notable for the following components:   Glucose, Bld 110 (*)    All other components within normal limits  URINALYSIS, ROUTINE W REFLEX MICROSCOPIC - Abnormal; Notable for the following components:   Leukocytes,Ua MODERATE (*)    All other components within normal limits  APTT  CBC WITH DIFFERENTIAL/PLATELET  PROTIME-INR  TYPE AND SCREEN  ABO/RH    IMAGES: MRI L-spine 03/10/18: IMPRESSION: 1. Interval evolution of postsurgical changes at L3-4. Mild residual left lateral recess and moderate left neural foraminal stenosis. 2. Unchanged disc and facet degeneration elsewhere, most notable at L4-5 where there is moderate spinal and right greater than left neural foraminal stenosis.   EKG: 04/21/18: Normal sinus rhythm Nonspecific ST and T wave abnormality Abnormal ECG No significant change since last tracing Confirmed by Glori Bickers [phone]) on 04/21/2018 9:07:20 PM   CV: Stress echo 10/13/16 ([facility]): 1. Probably normal dobutamine stress echocardiogram. 2. Normal baseline segmental wall motion with accentuation with stress agent. No focal wall abnormalities were noted at base or peak stress imaging. 3. Mild ST depression at peak exercise was noted on the ECG portion area.  Nuclear stress test 08/06/15 ([facility], Almira Coaster, MD, but nuclear imaging report read through [facility]): Conclusions: Negative by ECG,  await images.  IMPRESSION: 1. No reversible ischemia or infarction. 2. Normal left ventricular wall motion. 3. Left ventricular ejection fraction 69%. 4. Non-invasive risk stratification: Low   Past Medical History:  Diagnosis Date  . Anxiety   . Arthritis    hands, feet  . Back pain    chronic  . Cataracts, bilateral    MD  just watching right now  . Cystitides, interstitial, chronic   . Depression   . Fibromyalgia   . GERD (gastroesophageal reflux disease)   . Gout    feet  . High cholesterol   . Hypertension   . Shortness of breath    with exertion  . Sleep apnea    Uses CPAP nightly  . SVD (spontaneous vaginal delivery)    x 3  . Urinary disorder    bladder is in sling per pt  . Urinary tract infection   . Vertigo   . Wears glasses     Past Surgical History:  Procedure Laterality Date  . ABDOMINAL HYSTERECTOMY  1997   [place] , [place] SURGERY  10/20/2017  . bladder tack    . COLONOSCOPY N/A 08/23/2014   Procedure: COLONOSCOPY;  Surgeon: Rogene Houston, MD;  Location: [facility] ENDO SUITE;  Service: Endoscopy;  Laterality: N/A;  1200  . KNEE SURGERY Left 05/26/14   meniscus tear  . LAPAROSCOPIC SALPINGO OOPHERECTOMY  02/17/2012   Procedure: LAPAROSCOPIC SALPINGO OOPHORECTOMY;  Surgeon: Jonnie Kind, MD;  Location: [facility] ORS;  Service: Gynecology;  Laterality: Bilateral;  . LASIK Bilateral   . TOE SURGERY    . UPPER GI ENDOSCOPY      MEDICATIONS: No current facility-administered medications for this encounter.    Marland Kitchen acetaminophen (TYLENOL) 500 MG tablet  . ALPRAZolam (XANAX) 0.5 MG tablet  . amLODipine (NORVASC) 10 MG tablet  . CAMPHOR-MENTHOL EX  . Cannabidiol POWD  . Cholecalciferol (VITAMIN D3) 1.25 MG (50000 UT) TABS  . Cyanocobalamin (VITAMIN B-12 PO)  . cyclobenzaprine (FLEXERIL) 5 MG tablet  . DULoxetine (CYMBALTA) 60 MG capsule  . fluticasone (FLONASE) 50 MCG/ACT nasal spray  . HYDROcodone-acetaminophen (NORCO) 10-325 MG tablet  . HYDROcodone-acetaminophen (NORCO/VICODIN) 5-325 MG tablet  . lisinopril-hydrochlorothiazide (PRINZIDE,ZESTORETIC) 20-12.5 MG per tablet  . Melatonin 10 MG TABS  . Menthol, Topical Analgesic, (MUSCLE & JOINT EX)  . metoprolol (LOPRESSOR) 100 MG tablet  . Naphazoline-Pheniramine (Titus OP)  . Omega-3 Fatty Acids (FISH OIL) 1200 MG CAPS  .  pantoprazole (PROTONIX) 20 MG tablet  . predniSONE (DELTASONE) 10 MG tablet  . Probiotic Product (PROBIOTIC DAILY PO)  . simvastatin (ZOCOR) 20 MG tablet  Per med list comments, patient not currently taking Norco 5/325 or prednisone.    Myra Gianotti, PA-C Surgical Short Stay/Anesthesiology [facility] Phone [phone] [facility] Phone [phone] 04/22/2018 2:18 PM

## 2018-04-21 NOTE — Progress Notes (Signed)
Called patient, LMOM to inform her of positive staph result.  Called prescription into [facility] [phone].

## 2018-04-21 NOTE — Progress Notes (Signed)
Called Dr Laurena Bering Office, [facility] for [name], Scheduling Coordinator for MD to review UA result - moderate leukocytes, negative nitrite at PAT appointment today.

## 2018-04-21 NOTE — Progress Notes (Addendum)
PCP - Dr Judd Lien, [place], [place] Cardiologist - Denies  Chest x-ray - Denies EKG - 04/21/2018 Stress Test -  08/09/15 ([facility]-Faxed and Requested all cardiac records) ECHO -  Denies Cardiac Cath - Denies  Sleep Study - Years ago, unknown location, unable to request records CPAP - Yes, Pressure Setting=12 (Information faxed to respiratory for CPAP)    Anesthesia review: Yes - Cardiac Hx/Abn EKG, OSA  Patient denies fever, cough and chest pain at PAT appointment.  Patient has SOB with exertion but this is not a new symptom.  Patient verbalized understanding of instructions that were given to them at the PAT appointment. Patient was also instructed that they will need to review over the PAT instructions again at home before surgery.  Patient informed of the hospital visitor restriction policy that is now in effect.

## 2018-04-21 NOTE — Pre-Procedure Instructions (Signed)
[name]  04/21/2018      [facility] - [place], [place] [address] [place] [address] Phone: [phone] Fax: [phone]    Your procedure is scheduled on Wednesday, 04/28/2018.  Report to [facility] Admitting- Entrance A at 6:30 am  Call this number if you have problems the morning of surgery:  [phone]   Remember:  Do not eat or drink after midnight.  You may drink clear liquids until 4:30 am .  Clear liquids allowed are:                    Water, Juice (non-citric and without pulp), Carbonated beverages, Clear Tea, Black Coffee only and Gatorade      Take these medicines the morning of surgery with A SIP OF WATER:  amLODipine (NORVASC) metoprolol (LOPRESSOR)  pantoprazole (PROTONIX)  acetaminophen (TYLENOL) if needed ALPRAZolam Duanne Moron) if needed cyclobenzaprine (FLEXERIL) if needed fluticasone (FLONASE) if needed HYDROcodone-acetaminophen (Ennis) if needed Naphazoline-Pheniramine (OPCON-A OP) if needed    7 days prior to surgery STOP taking any Aspirin (unless otherwise instructed by your surgeon), Aleve, Naproxen, Ibuprofen, Motrin, Advil, Goody's, BC's, all herbal medications, fish oil, and all vitamins.   Do not wear jewelry, make-up or nail polish.  Do not wear lotions, powders, or perfumes, or deodorant.  Do not shave 48 hours prior to surgery.  Men may shave face and neck.  Do not bring valuables to the hospital.  [facility] is not responsible for any belongings or valuables.  Contacts, dentures or bridgework may not be worn into surgery.  Leave your suitcase in the car.  After surgery it may be brought to your room.  For patients admitted to the hospital, discharge time will be determined by your treatment team.  Patients discharged the day of surgery will not be allowed to drive home.   Special instructions:  [facility]- Preparing For Surgery  Before surgery, you can play an important role. Because skin  is not sterile, your skin needs to be as free of germs as possible. You can reduce the number of germs on your skin by washing with CHG (chlorahexidine gluconate) Soap before surgery.  CHG is an antiseptic cleaner which kills germs and bonds with the skin to continue killing germs even after washing.    Oral Hygiene is also important to reduce your risk of infection.  Remember - BRUSH YOUR TEETH THE MORNING OF SURGERY WITH YOUR REGULAR TOOTHPASTE  Please do not use if you have an allergy to CHG or antibacterial soaps. If your skin becomes reddened/irritated stop using the CHG.  Do not shave (including legs and underarms) for at least 48 hours prior to first CHG shower. It is OK to shave your face.  Please follow these instructions carefully.   1. Shower the [employer] BEFORE SURGERY (Tues) and the MORNING OF SURGERY (Wed) with CHG.   2. If you chose to wash your hair, wash your hair first as usual with your normal shampoo.  3. After you shampoo, rinse your hair and body thoroughly to remove the shampoo.  4. Use CHG as you would any other liquid soap. You can apply CHG directly to the skin and wash gently with a scrungie or a clean washcloth.   5. Apply the CHG Soap to your body ONLY FROM THE NECK DOWN.  Do not use on open wounds or open sores. Avoid contact with your eyes, ears, mouth and genitals (private  parts). Wash Face and genitals (private parts)  with your normal soap.  6. Wash thoroughly, paying special attention to the area where your surgery will be performed.  7. Thoroughly rinse your body with warm water from the neck down.  8. DO NOT shower/wash with your normal soap after using and rinsing off the CHG Soap.  9. Pat yourself dry with a CLEAN TOWEL.  10. Wear CLEAN PAJAMAS to bed the night before surgery, wear comfortable clothes the morning of surgery  11. Place CLEAN SHEETS on your bed the night of your first shower and DO NOT SLEEP WITH PETS.    Day of Surgery:  Shower as  directed above Do not apply any deodorants/lotions.  Please wear clean clothes to the hospital/surgery center.   Remember to brush your teeth WITH YOUR REGULAR TOOTHPASTE.      Please read over the following fact sheets that you were given.  Patient informed about the hospital visitor restriction policy that's currently in effect.

## 2018-04-26 NOTE — Anesthesia Preprocedure Evaluation (Addendum)
Anesthesia Evaluation  Patient identified by MRN, date of birth, ID band Patient awake    Reviewed: Allergy & Precautions, NPO status , Patient's Chart, lab work & pertinent test results  Airway Mallampati: II  TM Distance: >3 FB Neck ROM: Full    Dental  (+) Teeth Intact, Dental Advisory Given   Pulmonary    breath sounds clear to auscultation       Cardiovascular hypertension,  Rhythm:Regular Rate:Normal     Neuro/Psych    GI/Hepatic   Endo/Other    Renal/GU      Musculoskeletal   Abdominal   Peds  Hematology   Anesthesia Other Findings   Reproductive/Obstetrics                            Anesthesia Physical Anesthesia Plan  ASA: III  Anesthesia Plan: General   Post-op Pain Management:    Induction: Intravenous  PONV Risk Score and Plan: Ondansetron and Dexamethasone  Airway Management Planned: Oral ETT  Additional Equipment: Arterial line and CVP  Intra-op Plan:   Post-operative Plan: Extubation in OR  Informed Consent: I have reviewed the patients History and Physical, chart, labs and discussed the procedure including the risks, benefits and alternatives for the proposed anesthesia with the patient or authorized representative who has indicated his/her understanding and acceptance.     Dental advisory given  Plan Discussed with: CRNA and Anesthesiologist  Anesthesia Plan Comments: (PAT note written by Myra Gianotti, PA-C. )       Anesthesia Quick Evaluation

## 2018-04-27 NOTE — Progress Notes (Signed)
Denies fever, cough, shob, COVID-19 exposure, or travel since PAT appointment. 

## 2018-04-28 ENCOUNTER — Encounter (HOSPITAL_COMMUNITY): Payer: Self-pay | Admitting: *Deleted

## 2018-04-28 ENCOUNTER — Encounter (HOSPITAL_COMMUNITY)
Admission: RE | Disposition: A | Discharge status: Home / Self Care | Payer: Self-pay | Source: Home / Self Care | Attending: Orthopedic Surgery

## 2018-04-28 ENCOUNTER — Inpatient Hospital Stay (HOSPITAL_COMMUNITY)
Admission: RE | Admit: 2018-04-28 | Discharge: 2018-04-30 | DRG: 455 | Disposition: A | Discharge status: Home / Self Care | Payer: Medicare Other | Attending: Orthopedic Surgery | Admitting: Orthopedic Surgery

## 2018-04-28 ENCOUNTER — Inpatient Hospital Stay (HOSPITAL_COMMUNITY): Payer: Medicare Other | Admitting: Vascular Surgery

## 2018-04-28 ENCOUNTER — Inpatient Hospital Stay (HOSPITAL_COMMUNITY): Payer: Medicare Other

## 2018-04-28 ENCOUNTER — Other Ambulatory Visit: Payer: Self-pay

## 2018-04-28 DIAGNOSIS — Z7951 Long term (current) use of inhaled steroids: Secondary | ICD-10-CM

## 2018-04-28 DIAGNOSIS — M5126 Other intervertebral disc displacement, lumbar region: Secondary | ICD-10-CM | POA: Diagnosis not present

## 2018-04-28 DIAGNOSIS — Z79890 Hormone replacement therapy (postmenopausal): Secondary | ICD-10-CM

## 2018-04-28 DIAGNOSIS — I10 Essential (primary) hypertension: Secondary | ICD-10-CM | POA: Diagnosis present

## 2018-04-28 DIAGNOSIS — G4730 Sleep apnea, unspecified: Secondary | ICD-10-CM | POA: Diagnosis present

## 2018-04-28 DIAGNOSIS — Z79891 Long term (current) use of opiate analgesic: Secondary | ICD-10-CM

## 2018-04-28 DIAGNOSIS — Z9889 Other specified postprocedural states: Secondary | ICD-10-CM | POA: Diagnosis not present

## 2018-04-28 DIAGNOSIS — M5410 Radiculopathy, site unspecified: Secondary | ICD-10-CM | POA: Diagnosis present

## 2018-04-28 DIAGNOSIS — I878 Other specified disorders of veins: Secondary | ICD-10-CM

## 2018-04-28 DIAGNOSIS — M5136 Other intervertebral disc degeneration, lumbar region: Secondary | ICD-10-CM | POA: Diagnosis not present

## 2018-04-28 DIAGNOSIS — K219 Gastro-esophageal reflux disease without esophagitis: Secondary | ICD-10-CM | POA: Diagnosis present

## 2018-04-28 DIAGNOSIS — M19071 Primary osteoarthritis, right ankle and foot: Secondary | ICD-10-CM | POA: Diagnosis present

## 2018-04-28 DIAGNOSIS — M549 Dorsalgia, unspecified: Secondary | ICD-10-CM | POA: Diagnosis not present

## 2018-04-28 DIAGNOSIS — E7800 Pure hypercholesterolemia, unspecified: Secondary | ICD-10-CM | POA: Diagnosis not present

## 2018-04-28 DIAGNOSIS — F419 Anxiety disorder, unspecified: Secondary | ICD-10-CM | POA: Diagnosis present

## 2018-04-28 DIAGNOSIS — M19042 Primary osteoarthritis, left hand: Secondary | ICD-10-CM | POA: Diagnosis present

## 2018-04-28 DIAGNOSIS — M4856XS Collapsed vertebra, not elsewhere classified, lumbar region, sequela of fracture: Secondary | ICD-10-CM | POA: Diagnosis not present

## 2018-04-28 DIAGNOSIS — M19041 Primary osteoarthritis, right hand: Secondary | ICD-10-CM | POA: Diagnosis present

## 2018-04-28 DIAGNOSIS — F329 Major depressive disorder, single episode, unspecified: Secondary | ICD-10-CM | POA: Diagnosis not present

## 2018-04-28 DIAGNOSIS — E669 Obesity, unspecified: Secondary | ICD-10-CM | POA: Diagnosis present

## 2018-04-28 DIAGNOSIS — M4326 Fusion of spine, lumbar region: Secondary | ICD-10-CM | POA: Diagnosis not present

## 2018-04-28 DIAGNOSIS — Z79899 Other long term (current) drug therapy: Secondary | ICD-10-CM

## 2018-04-28 DIAGNOSIS — Z452 Encounter for adjustment and management of vascular access device: Secondary | ICD-10-CM | POA: Diagnosis not present

## 2018-04-28 DIAGNOSIS — M19072 Primary osteoarthritis, left ankle and foot: Secondary | ICD-10-CM | POA: Diagnosis present

## 2018-04-28 DIAGNOSIS — M797 Fibromyalgia: Secondary | ICD-10-CM | POA: Diagnosis present

## 2018-04-28 DIAGNOSIS — M4856XA Collapsed vertebra, not elsewhere classified, lumbar region, initial encounter for fracture: Secondary | ICD-10-CM | POA: Diagnosis not present

## 2018-04-28 DIAGNOSIS — Z888 Allergy status to other drugs, medicaments and biological substances status: Secondary | ICD-10-CM | POA: Diagnosis not present

## 2018-04-28 DIAGNOSIS — Z6834 Body mass index (BMI) 34.0-34.9, adult: Secondary | ICD-10-CM | POA: Diagnosis not present

## 2018-04-28 DIAGNOSIS — Z419 Encounter for procedure for purposes other than remedying health state, unspecified: Secondary | ICD-10-CM

## 2018-04-28 DIAGNOSIS — M5116 Intervertebral disc disorders with radiculopathy, lumbar region: Secondary | ICD-10-CM | POA: Diagnosis present

## 2018-04-28 DIAGNOSIS — Z981 Arthrodesis status: Secondary | ICD-10-CM

## 2018-04-28 DIAGNOSIS — M5416 Radiculopathy, lumbar region: Secondary | ICD-10-CM | POA: Diagnosis not present

## 2018-04-28 DIAGNOSIS — J939 Pneumothorax, unspecified: Secondary | ICD-10-CM

## 2018-04-28 DIAGNOSIS — M48061 Spinal stenosis, lumbar region without neurogenic claudication: Secondary | ICD-10-CM | POA: Diagnosis present

## 2018-04-28 HISTORY — PX: ANTERIOR LAT LUMBAR FUSION: SHX1168

## 2018-04-28 SURGERY — ANTERIOR LATERAL LUMBAR FUSION 2 LEVELS
Anesthesia: General | Site: Back | Laterality: Right

## 2018-04-28 MED ORDER — SODIUM CHLORIDE 0.9% FLUSH
3.0000 mL | Freq: Two times a day (BID) | INTRAVENOUS | Status: DC
  Administered 2018-04-28: 3 mL via INTRAVENOUS

## 2018-04-28 MED ORDER — ALPRAZOLAM 0.5 MG PO TABS
0.5000 mg | ORAL_TABLET | Freq: Two times a day (BID) | ORAL | Status: DC
  Administered 2018-04-28 – 2018-04-30 (×3): 0.5 mg via ORAL
  Filled 2018-04-28 (×3): qty 1

## 2018-04-28 MED ORDER — LISINOPRIL-HYDROCHLOROTHIAZIDE 20-12.5 MG PO TABS: 1.0000 | ORAL_TABLET | Freq: Two times a day (BID) | ORAL | Status: DC

## 2018-04-28 MED ORDER — PROPOFOL 10 MG/ML IV BOLUS
INTRAVENOUS | Status: DC | PRN
  Administered 2018-04-28: 20 mg via INTRAVENOUS
  Administered 2018-04-28: 50 mg via INTRAVENOUS
  Administered 2018-04-28: 20 mg via INTRAVENOUS
  Administered 2018-04-28: 150 mg via INTRAVENOUS

## 2018-04-28 MED ORDER — OXYCODONE HCL 5 MG/5ML PO SOLN: 5.0000 mg | Freq: Once | ORAL | Status: DC | PRN

## 2018-04-28 MED ORDER — LISINOPRIL 20 MG PO TABS
20.0000 mg | ORAL_TABLET | Freq: Two times a day (BID) | ORAL | Status: DC
  Administered 2018-04-29 – 2018-04-30 (×2): 20 mg via ORAL
  Filled 2018-04-28 (×3): qty 1

## 2018-04-28 MED ORDER — FLUTICASONE PROPIONATE 50 MCG/ACT NA SUSP
1.0000 | Freq: Every day | NASAL | Status: DC | PRN
  Administered 2018-04-28: 1 via NASAL
  Filled 2018-04-28: qty 16

## 2018-04-28 MED ORDER — METOPROLOL TARTRATE 50 MG PO TABS
100.0000 mg | ORAL_TABLET | Freq: Two times a day (BID) | ORAL | Status: DC
  Administered 2018-04-28 – 2018-04-30 (×3): 100 mg via ORAL
  Filled 2018-04-28 (×3): qty 2

## 2018-04-28 MED ORDER — HEMOSTATIC AGENTS (NO CHARGE) OPTIME
TOPICAL | Status: DC | PRN
  Administered 2018-04-28: 1 via TOPICAL

## 2018-04-28 MED ORDER — MENTHOL 3 MG MT LOZG: 1.0000 | LOZENGE | OROMUCOSAL | Status: DC | PRN

## 2018-04-28 MED ORDER — LIDOCAINE 2% (20 MG/ML) 5 ML SYRINGE
INTRAMUSCULAR | Status: AC
  Filled 2018-04-28: qty 5

## 2018-04-28 MED ORDER — ONDANSETRON HCL 4 MG/2ML IJ SOLN
INTRAMUSCULAR | Status: AC
  Filled 2018-04-28: qty 2

## 2018-04-28 MED ORDER — FENTANYL CITRATE (PF) 100 MCG/2ML IJ SOLN
INTRAMUSCULAR | Status: DC | PRN
  Administered 2018-04-28: 50 ug via INTRAVENOUS
  Administered 2018-04-28 (×2): 25 ug via INTRAVENOUS
  Administered 2018-04-28: 100 ug via INTRAVENOUS
  Administered 2018-04-28: 50 ug via INTRAVENOUS

## 2018-04-28 MED ORDER — BUPIVACAINE LIPOSOME 1.3 % IJ SUSP
20.0000 mL | INTRAMUSCULAR | Status: DC
  Filled 2018-04-28: qty 20

## 2018-04-28 MED ORDER — ESMOLOL HCL 100 MG/10ML IV SOLN
INTRAVENOUS | Status: AC
  Filled 2018-04-28: qty 10

## 2018-04-28 MED ORDER — HYDROCHLOROTHIAZIDE 12.5 MG PO CAPS
12.5000 mg | ORAL_CAPSULE | Freq: Two times a day (BID) | ORAL | Status: DC
  Administered 2018-04-28 – 2018-04-30 (×3): 12.5 mg via ORAL
  Filled 2018-04-28 (×3): qty 1

## 2018-04-28 MED ORDER — CEFAZOLIN SODIUM-DEXTROSE 2-4 GM/100ML-% IV SOLN
2.0000 g | INTRAVENOUS | Status: AC
  Administered 2018-04-28: 09:00:00 2 g via INTRAVENOUS
  Filled 2018-04-28: qty 100

## 2018-04-28 MED ORDER — FENTANYL CITRATE (PF) 100 MCG/2ML IJ SOLN
25.0000 ug | INTRAMUSCULAR | Status: DC | PRN
  Administered 2018-04-28 (×4): 25 ug via INTRAVENOUS

## 2018-04-28 MED ORDER — NAPHAZOLINE-PHENIRAMINE 0.025-0.3 % OP SOLN
Freq: Three times a day (TID) | OPHTHALMIC | Status: DC | PRN
  Filled 2018-04-28: qty 15

## 2018-04-28 MED ORDER — DEXAMETHASONE SODIUM PHOSPHATE 10 MG/ML IJ SOLN
INTRAMUSCULAR | Status: AC
  Filled 2018-04-28: qty 1

## 2018-04-28 MED ORDER — LACTATED RINGERS IV SOLN
INTRAVENOUS | Status: DC | PRN
  Administered 2018-04-28 (×2): via INTRAVENOUS

## 2018-04-28 MED ORDER — FLEET ENEMA 7-19 GM/118ML RE ENEM: 1.0000 | ENEMA | Freq: Once | RECTAL | Status: DC | PRN

## 2018-04-28 MED ORDER — ONDANSETRON HCL 4 MG/2ML IJ SOLN: 4.0000 mg | Freq: Once | INTRAMUSCULAR | Status: DC | PRN

## 2018-04-28 MED ORDER — VITAMIN B-12 100 MCG PO TABS
100.0000 ug | ORAL_TABLET | Freq: Every day | ORAL | Status: DC
  Administered 2018-04-30: 100 ug via ORAL
  Filled 2018-04-28: qty 1

## 2018-04-28 MED ORDER — DOCUSATE SODIUM 100 MG PO CAPS
100.0000 mg | ORAL_CAPSULE | Freq: Two times a day (BID) | ORAL | Status: DC
  Administered 2018-04-28 – 2018-04-30 (×3): 100 mg via ORAL
  Filled 2018-04-28 (×3): qty 1

## 2018-04-28 MED ORDER — ALBUMIN HUMAN 5 % IV SOLN
INTRAVENOUS | Status: DC | PRN
  Administered 2018-04-28: 11:00:00 via INTRAVENOUS

## 2018-04-28 MED ORDER — BISACODYL 5 MG PO TBEC: 5.0000 mg | DELAYED_RELEASE_TABLET | Freq: Every day | ORAL | Status: DC | PRN

## 2018-04-28 MED ORDER — VASOPRESSIN 20 UNIT/ML IV SOLN
INTRAVENOUS | Status: DC | PRN
  Administered 2018-04-28: 1 [IU] via INTRAVENOUS

## 2018-04-28 MED ORDER — ZOLPIDEM TARTRATE 5 MG PO TABS: 5.0000 mg | ORAL_TABLET | Freq: Every evening | ORAL | Status: DC | PRN

## 2018-04-28 MED ORDER — SODIUM CHLORIDE 0.9% FLUSH: 3.0000 mL | INTRAVENOUS | Status: DC | PRN

## 2018-04-28 MED ORDER — AMLODIPINE BESYLATE 5 MG PO TABS
5.0000 mg | ORAL_TABLET | Freq: Every day | ORAL | Status: DC
  Administered 2018-04-30: 5 mg via ORAL
  Filled 2018-04-28: qty 1

## 2018-04-28 MED ORDER — PROPOFOL 10 MG/ML IV BOLUS
INTRAVENOUS | Status: AC
  Filled 2018-04-28: qty 20

## 2018-04-28 MED ORDER — OXYCODONE HCL 5 MG PO TABS: 5.0000 mg | ORAL_TABLET | Freq: Once | ORAL | Status: DC | PRN

## 2018-04-28 MED ORDER — PANTOPRAZOLE SODIUM 20 MG PO TBEC
20.0000 mg | DELAYED_RELEASE_TABLET | Freq: Every day | ORAL | Status: DC
  Administered 2018-04-30: 20 mg via ORAL
  Filled 2018-04-28: qty 1

## 2018-04-28 MED ORDER — GLYCOPYRROLATE PF 0.2 MG/ML IJ SOSY
PREFILLED_SYRINGE | INTRAMUSCULAR | Status: DC | PRN
  Administered 2018-04-28: .2 mg via INTRAVENOUS

## 2018-04-28 MED ORDER — DIAZEPAM 5 MG PO TABS
5.0000 mg | ORAL_TABLET | Freq: Four times a day (QID) | ORAL | Status: DC | PRN
  Administered 2018-04-28 – 2018-04-29 (×2): 5 mg via ORAL
  Filled 2018-04-28 (×2): qty 1

## 2018-04-28 MED ORDER — MELATONIN 3 MG PO TABS
9.0000 mg | ORAL_TABLET | Freq: Every day | ORAL | Status: DC
  Administered 2018-04-28 – 2018-04-29 (×2): 9 mg via ORAL
  Filled 2018-04-28 (×3): qty 3

## 2018-04-28 MED ORDER — BUPIVACAINE-EPINEPHRINE (PF) 0.25% -1:200000 IJ SOLN
INTRAMUSCULAR | Status: AC
  Filled 2018-04-28: qty 30

## 2018-04-28 MED ORDER — POTASSIUM CHLORIDE IN NACL 20-0.9 MEQ/L-% IV SOLN
INTRAVENOUS | Status: DC
  Administered 2018-04-28: 15:00:00 via INTRAVENOUS
  Filled 2018-04-28 (×2): qty 1000

## 2018-04-28 MED ORDER — SODIUM CHLORIDE 0.9% FLUSH
10.0000 mL | Freq: Two times a day (BID) | INTRAVENOUS | Status: DC
  Administered 2018-04-28 (×2): 10 mL

## 2018-04-28 MED ORDER — PHENYLEPHRINE 40 MCG/ML (10ML) SYRINGE FOR IV PUSH (FOR BLOOD PRESSURE SUPPORT)
PREFILLED_SYRINGE | INTRAVENOUS | Status: DC | PRN
  Administered 2018-04-28: 80 ug via INTRAVENOUS
  Administered 2018-04-28: 120 ug via INTRAVENOUS
  Administered 2018-04-28: 80 ug via INTRAVENOUS

## 2018-04-28 MED ORDER — SIMVASTATIN 20 MG PO TABS
10.0000 mg | ORAL_TABLET | Freq: Every day | ORAL | Status: DC
  Administered 2018-04-28 – 2018-04-29 (×2): 10 mg via ORAL
  Filled 2018-04-28 (×2): qty 1

## 2018-04-28 MED ORDER — ONDANSETRON HCL 4 MG/2ML IJ SOLN: 4.0000 mg | Freq: Four times a day (QID) | INTRAMUSCULAR | Status: DC | PRN

## 2018-04-28 MED ORDER — FENTANYL CITRATE (PF) 250 MCG/5ML IJ SOLN
INTRAMUSCULAR | Status: AC
  Filled 2018-04-28: qty 5

## 2018-04-28 MED ORDER — 0.9 % SODIUM CHLORIDE (POUR BTL) OPTIME
TOPICAL | Status: DC | PRN
  Administered 2018-04-28 (×3): 1000 mL

## 2018-04-28 MED ORDER — VASOPRESSIN 20 UNIT/ML IV SOLN
INTRAVENOUS | Status: AC
  Filled 2018-04-28: qty 1

## 2018-04-28 MED ORDER — SODIUM CHLORIDE 0.9 % IV SOLN: 250.0000 mL | INTRAVENOUS | Status: DC

## 2018-04-28 MED ORDER — MIDAZOLAM HCL 5 MG/5ML IJ SOLN
INTRAMUSCULAR | Status: DC | PRN
  Administered 2018-04-28: 2 mg via INTRAVENOUS

## 2018-04-28 MED ORDER — BUPIVACAINE-EPINEPHRINE 0.25% -1:200000 IJ SOLN
INTRAMUSCULAR | Status: DC | PRN
  Administered 2018-04-28: 30 mL

## 2018-04-28 MED ORDER — MIDAZOLAM HCL 2 MG/2ML IJ SOLN
INTRAMUSCULAR | Status: AC
  Filled 2018-04-28: qty 2

## 2018-04-28 MED ORDER — VITAMIN D (ERGOCALCIFEROL) 1.25 MG (50000 UNIT) PO CAPS
50000.0000 [IU] | ORAL_CAPSULE | ORAL | Status: DC
  Filled 2018-04-28: qty 1

## 2018-04-28 MED ORDER — BUPIVACAINE LIPOSOME 1.3 % IJ SUSP: INTRAMUSCULAR | Status: DC | PRN

## 2018-04-28 MED ORDER — PHENOL 1.4 % MT LIQD
1.0000 | OROMUCOSAL | Status: DC | PRN
  Administered 2018-04-28: 15:00:00 1 via OROMUCOSAL
  Filled 2018-04-28: qty 177

## 2018-04-28 MED ORDER — THROMBIN 20000 UNITS EX SOLR
CUTANEOUS | Status: DC | PRN
  Administered 2018-04-28 (×2): 20 mL via TOPICAL

## 2018-04-28 MED ORDER — THROMBIN 20000 UNITS EX SOLR
CUTANEOUS | Status: AC
  Filled 2018-04-28: qty 20000

## 2018-04-28 MED ORDER — SODIUM CHLORIDE 0.9% FLUSH: 10.0000 mL | INTRAVENOUS | Status: DC | PRN

## 2018-04-28 MED ORDER — CEFAZOLIN SODIUM-DEXTROSE 2-4 GM/100ML-% IV SOLN
2.0000 g | Freq: Three times a day (TID) | INTRAVENOUS | Status: AC
  Administered 2018-04-28 – 2018-04-29 (×2): 2 g via INTRAVENOUS
  Filled 2018-04-28 (×2): qty 100

## 2018-04-28 MED ORDER — OXYCODONE-ACETAMINOPHEN 5-325 MG PO TABS
1.0000 | ORAL_TABLET | ORAL | Status: DC | PRN
  Administered 2018-04-28 – 2018-04-30 (×5): 2 via ORAL
  Administered 2018-04-30: 1 via ORAL
  Filled 2018-04-28 (×3): qty 2
  Filled 2018-04-28: qty 1
  Filled 2018-04-28 (×2): qty 2

## 2018-04-28 MED ORDER — ONDANSETRON HCL 4 MG PO TABS: 4.0000 mg | ORAL_TABLET | Freq: Four times a day (QID) | ORAL | Status: DC | PRN

## 2018-04-28 MED ORDER — SODIUM CHLORIDE 0.9 % IV SOLN
INTRAVENOUS | Status: DC | PRN
  Administered 2018-04-28: 80 ug/min via INTRAVENOUS

## 2018-04-28 MED ORDER — MORPHINE SULFATE (PF) 2 MG/ML IV SOLN
1.0000 mg | INTRAVENOUS | Status: DC | PRN
  Administered 2018-04-28 – 2018-04-29 (×5): 2 mg via INTRAVENOUS
  Filled 2018-04-28 (×5): qty 1

## 2018-04-28 MED ORDER — ACETAMINOPHEN 500 MG PO TABS: 500.0000 mg | ORAL_TABLET | Freq: Four times a day (QID) | ORAL | Status: DC | PRN

## 2018-04-28 MED ORDER — SENNOSIDES-DOCUSATE SODIUM 8.6-50 MG PO TABS: 1.0000 | ORAL_TABLET | Freq: Every evening | ORAL | Status: DC | PRN

## 2018-04-28 MED ORDER — LIDOCAINE 2% (20 MG/ML) 5 ML SYRINGE
INTRAMUSCULAR | Status: DC | PRN
  Administered 2018-04-28: 100 mg via INTRAVENOUS

## 2018-04-28 MED ORDER — ALUM & MAG HYDROXIDE-SIMETH 200-200-20 MG/5ML PO SUSP: 30.0000 mL | Freq: Four times a day (QID) | ORAL | Status: DC | PRN

## 2018-04-28 MED ORDER — ONDANSETRON HCL 4 MG/2ML IJ SOLN
INTRAMUSCULAR | Status: DC | PRN
  Administered 2018-04-28: 4 mg via INTRAVENOUS

## 2018-04-28 MED ORDER — FENTANYL CITRATE (PF) 100 MCG/2ML IJ SOLN
INTRAMUSCULAR | Status: AC
  Filled 2018-04-28: qty 2

## 2018-04-28 MED ORDER — SUCCINYLCHOLINE CHLORIDE 20 MG/ML IJ SOLN
INTRAMUSCULAR | Status: DC | PRN
  Administered 2018-04-28: 140 mg via INTRAVENOUS

## 2018-04-28 MED ORDER — DEXAMETHASONE SODIUM PHOSPHATE 10 MG/ML IJ SOLN
INTRAMUSCULAR | Status: DC | PRN
  Administered 2018-04-28: 10 mg via INTRAVENOUS

## 2018-04-28 MED ORDER — VITAMIN D 25 MCG (1000 UNIT) PO TABS: 50000.0000 [IU] | ORAL_TABLET | ORAL | Status: DC

## 2018-04-28 MED ORDER — DULOXETINE HCL 60 MG PO CPEP
60.0000 mg | ORAL_CAPSULE | Freq: Every day | ORAL | Status: DC
  Administered 2018-04-28 – 2018-04-29 (×2): 60 mg via ORAL
  Filled 2018-04-28 (×2): qty 1

## 2018-04-28 MED ORDER — ROCURONIUM BROMIDE 50 MG/5ML IV SOSY
PREFILLED_SYRINGE | INTRAVENOUS | Status: AC
  Filled 2018-04-28: qty 5

## 2018-04-28 MED ORDER — EPHEDRINE SULFATE-NACL 50-0.9 MG/10ML-% IV SOSY
PREFILLED_SYRINGE | INTRAVENOUS | Status: DC | PRN
  Administered 2018-04-28 (×3): 5 mg via INTRAVENOUS
  Administered 2018-04-28: 10 mg via INTRAVENOUS
  Administered 2018-04-28: 5 mg via INTRAVENOUS

## 2018-04-28 MED ORDER — POVIDONE-IODINE 7.5 % EX SOLN: Freq: Once | CUTANEOUS | Status: DC

## 2018-04-28 SURGICAL SUPPLY — 78 items
AGENT HMST KT MTR STRL THRMB (HEMOSTASIS)
APL SKNCLS STERI-STRIP NONHPOA (GAUZE/BANDAGES/DRESSINGS) ×1
BENZOIN TINCTURE PRP APPL 2/3 (GAUZE/BANDAGES/DRESSINGS) ×2 IMPLANT
BLADE CLIPPER SURG (BLADE) IMPLANT
BLADE SURG 10 STRL SS (BLADE) ×3 IMPLANT
BONE VIVIGEN FORMABLE 1.3CC (Bone Implant) ×3 IMPLANT
BONE VIVIGEN FORMABLE 10CC (Bone Implant) ×3 IMPLANT
CAGE COROENT XL PLUS 8X18X55 (Cage) ×4 IMPLANT
CLOSURE WOUND 1/2 X4 (GAUZE/BANDAGES/DRESSINGS) ×1
COVER BACK TABLE 80X110 HD (DRAPES) ×3 IMPLANT
COVER SURGICAL LIGHT HANDLE (MISCELLANEOUS) ×3 IMPLANT
COVER WAND RF STERILE (DRAPES) ×3 IMPLANT
DRAPE C-ARM 42X72 X-RAY (DRAPES) ×3 IMPLANT
DRAPE C-ARMOR (DRAPES) ×2 IMPLANT
DRAPE POUCH INSTRU U-SHP 10X18 (DRAPES) ×3 IMPLANT
DRAPE SURG 17X23 STRL (DRAPES) ×12 IMPLANT
DURAPREP 26ML APPLICATOR (WOUND CARE) ×3 IMPLANT
ELECT BLADE 4.0 EZ CLEAN MEGAD (MISCELLANEOUS) ×3
ELECT BLADE 6.5 EXT (BLADE) ×3 IMPLANT
ELECT CAUTERY BLADE 6.4 (BLADE) ×3 IMPLANT
ELECT REM PT RETURN 9FT ADLT (ELECTROSURGICAL) ×3
ELECTRODE BLDE 4.0 EZ CLN MEGD (MISCELLANEOUS) IMPLANT
ELECTRODE REM PT RTRN 9FT ADLT (ELECTROSURGICAL) ×1 IMPLANT
GAUZE 4X4 16PLY RFD (DISPOSABLE) ×3 IMPLANT
GAUZE SPONGE 4X4 12PLY STRL LF (GAUZE/BANDAGES/DRESSINGS) ×2 IMPLANT
GLOVE BIO SURGEON STRL SZ7 (GLOVE) ×6 IMPLANT
GLOVE BIO SURGEON STRL SZ8 (GLOVE) ×3 IMPLANT
GLOVE BIOGEL PI IND STRL 7.0 (GLOVE) ×1 IMPLANT
GLOVE BIOGEL PI IND STRL 7.5 (GLOVE) IMPLANT
GLOVE BIOGEL PI IND STRL 8 (GLOVE) ×1 IMPLANT
GLOVE BIOGEL PI INDICATOR 7.0 (GLOVE) ×2
GLOVE BIOGEL PI INDICATOR 7.5 (GLOVE) ×2
GLOVE BIOGEL PI INDICATOR 8 (GLOVE) ×2
GLOVE SURG SS PI 7.5 STRL IVOR (GLOVE) ×2 IMPLANT
GOWN STRL REUS W/ TWL LRG LVL3 (GOWN DISPOSABLE) ×1 IMPLANT
GOWN STRL REUS W/ TWL XL LVL3 (GOWN DISPOSABLE) ×2 IMPLANT
GOWN STRL REUS W/TWL LRG LVL3 (GOWN DISPOSABLE) ×3
GOWN STRL REUS W/TWL XL LVL3 (GOWN DISPOSABLE) ×6
GRAFT BNE MATRIX VG FRMBL L 10 (Bone Implant) IMPLANT
GRAFT BNE MATRIX VG FRMBL SM 1 (Bone Implant) IMPLANT
KIT ALARA NEURO ACCESS (KITS) ×4 IMPLANT
KIT BASIN OR (CUSTOM PROCEDURE TRAY) ×3 IMPLANT
KIT DILATOR XLIF 5 (KITS) ×1 IMPLANT
KIT SURGICAL ACCESS MAXCESS 4 (KITS) ×2 IMPLANT
KIT TURNOVER KIT B (KITS) ×3 IMPLANT
KIT XLIF (KITS) ×1
MARKER SKIN DUAL TIP RULER LAB (MISCELLANEOUS) ×3 IMPLANT
MODULE EMG NDL SSEP NVM5 (NEEDLE) IMPLANT
MODULE EMG NEEDLE SSEP NVM5 (NEEDLE) ×3 IMPLANT
MODULE NVM5 NEXT GEN EMG (NEEDLE) ×2 IMPLANT
NDL HYPO 25GX1X1/2 BEV (NEEDLE) ×1 IMPLANT
NDL SPNL 18GX3.5 QUINCKE PK (NEEDLE) ×1 IMPLANT
NEEDLE HYPO 25GX1X1/2 BEV (NEEDLE) IMPLANT
NEEDLE SPNL 18GX3.5 QUINCKE PK (NEEDLE) ×3 IMPLANT
NS IRRIG 1000ML POUR BTL (IV SOLUTION) ×3 IMPLANT
PACK LAMINECTOMY ORTHO (CUSTOM PROCEDURE TRAY) ×3 IMPLANT
PACK UNIVERSAL I (CUSTOM PROCEDURE TRAY) ×3 IMPLANT
PAD ARMBOARD 7.5X6 YLW CONV (MISCELLANEOUS) ×6 IMPLANT
PATTIES SURGICAL .5 X1 (DISPOSABLE) ×2 IMPLANT
SPONGE INTESTINAL PEANUT (DISPOSABLE) ×6 IMPLANT
SPONGE LAP 4X18 RFD (DISPOSABLE) ×3 IMPLANT
SPONGE SURGIFOAM ABS GEL 100 (HEMOSTASIS) IMPLANT
STRIP CLOSURE SKIN 1/2X4 (GAUZE/BANDAGES/DRESSINGS) ×1 IMPLANT
SURGIFLO W/THROMBIN 8M KIT (HEMOSTASIS) IMPLANT
SUT MNCRL AB 4-0 PS2 18 (SUTURE) ×3 IMPLANT
SUT VIC AB 0 CT1 18XCR BRD 8 (SUTURE) ×1 IMPLANT
SUT VIC AB 0 CT1 8-18 (SUTURE) ×3
SUT VIC AB 1 CT1 18XCR BRD 8 (SUTURE) IMPLANT
SUT VIC AB 1 CT1 8-18 (SUTURE) ×3
SUT VIC AB 2-0 CT2 18 VCP726D (SUTURE) ×3 IMPLANT
SYR BULB IRRIGATION 50ML (SYRINGE) ×3 IMPLANT
SYR CONTROL 10ML LL (SYRINGE) ×3 IMPLANT
TAPE CLOTH SURG 6X10 WHT LF (GAUZE/BANDAGES/DRESSINGS) ×2 IMPLANT
TOWEL GREEN STERILE (TOWEL DISPOSABLE) ×3 IMPLANT
TOWEL GREEN STERILE FF (TOWEL DISPOSABLE) ×3 IMPLANT
TRAY FOLEY MTR SLVR 16FR STAT (SET/KITS/TRAYS/PACK) ×3 IMPLANT
WATER STERILE IRR 1000ML POUR (IV SOLUTION) ×3 IMPLANT
YANKAUER SUCT BULB TIP NO VENT (SUCTIONS) ×3 IMPLANT

## 2018-04-28 NOTE — Anesthesia Procedure Notes (Signed)
Procedure Name: Intubation Performed by: Milford Cage, CRNA Pre-anesthesia Checklist: Patient identified, Emergency Drugs available, Suction available and Patient being monitored Patient Re-evaluated:Patient Re-evaluated prior to induction Oxygen Delivery Method: Circle System Utilized Preoxygenation: Pre-oxygenation with 100% oxygen Induction Type: IV induction and Rapid sequence Laryngoscope Size: Glidescope and 4 Grade View: Grade I Tube type: Oral Tube size: 7.0 mm Number of attempts: 1 Airway Equipment and Method: Stylet and Oral airway Placement Confirmation: ETT inserted through vocal cords under direct vision,  positive ETCO2 and breath sounds checked- equal and bilateral Secured at: 23 cm Tube secured with: Tape Dental Injury: Teeth and Oropharynx as per pre-operative assessment

## 2018-04-28 NOTE — Anesthesia Procedure Notes (Signed)
Central Venous Catheter Insertion Performed by: Roberts Gaudy, MD, anesthesiologist Start/End4/15/2020 8:05 AM, 04/28/2018 8:15 AM Patient location: Pre-op. Preanesthetic checklist: patient identified, IV checked, site marked, risks and benefits discussed, surgical consent, monitors and equipment checked, pre-op evaluation, timeout performed and anesthesia consent Position: Trendelenburg Lidocaine 1% used for infiltration and patient sedated Hand hygiene performed , maximum sterile barriers used  and Seldinger technique used Catheter size: 8 Fr Total catheter length 16. Central line was placed.Double lumen Procedure performed using ultrasound guided technique. Ultrasound Notes:anatomy identified, needle tip was noted to be adjacent to the nerve/plexus identified, no ultrasound evidence of intravascular and/or intraneural injection and image(s) printed for medical record Attempts: 1 Following insertion, dressing applied, line sutured and Biopatch. Post procedure assessment: blood return through all ports  Patient tolerated the procedure well with no immediate complications.

## 2018-04-28 NOTE — Progress Notes (Signed)
RT set up CPAP and instructed patient on how to use. Patient stated she would place herself on when she is ready for bed. RT informed patient to have RT called if she needs assistance. RT will monitor as needed.

## 2018-04-28 NOTE — Op Note (Addendum)
NAME: [name]   MEDICAL RECORD NO: [id number]   DATE OF BIRTH: [birth year]   PHYSICIAN:DR. Abeer Deskins L. Jonavan Vanhorn  ASSISTANT: KAYLA MCKENZIE, PA-C   DATE OF PROCEDURE:  04/28/2018                                         OPERATIVE REPORT     PREOPERATIVE DIAGNOSES: 1.  Left-sided lumbar radiculopathy, manifesting as severe left leg pain and weakness 2.  Degenerative disc disease, L4-5, L3-4 3.  Significant left-sided segmental collapse, L3-4 4.  Severe low back pain 5.  Status post previous L3-4 decompression   POSTOPERATIVE DIAGNOSES: 1.  Left-sided lumbar radiculopathy, manifesting as severe left leg pain and weakness 2.  Degenerative disc disease, L4-5, L3-4 3.  Significant left-sided segmental collapse, L3-4 4.  Severe low back pain 5.  Status post previous L3-4 decompression   PROCEDURE (stage 1 of 2):    1.  Right-sided lateral interbody fusion via a direct lateral retroperitoneal approach, L3/4, L4/5. 2.  Insertion of interbody device x2 (8x18x38mm and 10x18x31mm NuVasive intervertebral spacers). 3.  Use of morselized allograft -- ViviGen.  4.  Intraoperative use of fluoroscopy.   SURGEON:  Phylliss Bob, MD   ASSISTANT:  Pricilla Holm PA-C.   ANESTHESIA:  General endotracheal anesthesia.   COMPLICATIONS:  None.   DISPOSITION:  Stable.   ESTIMATED BLOOD LOSS:  Minimal.   INDICATIONS:  [name] is a 68 y.o. female who has been having ongoing and severe pain in the left leg.  Patient is status post a microdiscectomy at L3-4.  She did very well from surgery initially, but did subsequently develop significant pain in the left leg.  This did become debilitating, progressing to prominent left leg weakness, as well as severe pain in the low back.  Her work-up did reveal degenerative disc disease at L3-4 and L4-5, with severe left-sided neuroforaminal narrowing at L3-4.  We did discuss treatment options.  Given her debilitating pain and weakness, she did wish to  proceed with surgery.  The patient has been having substantial difficulty with routine activities of daily living, such as getting around the house and using the bathroom.  DESCRIPTION OF PROCEDURE:  On 04/28/2018 the patient was brought to surgery and general endotracheal anesthesia was administered.  The patient was placed in the lateral decubitus position, with the right side up.  Neurologic monitoring leads were placed by the monitoring technician.  The patient's torso and lower extremities were secured to the bed.  The patient's hips and knees were flexed in order to lessen the tension on the psoas musculature.  The right flank was then prepped and draped in the usual sterile fashion.  The bed was slightly flexed, in order to optimize exposure to the L3-4 and L4-5 intervertebral space. After a timeout procedure was performed, a right-sided transverse incision was made over the right flank overlying the L4-5 intervertebral space.  I did dissect just above the iliac crest, and through the external and internal oblique musculature, and transversalis musculature and fascia.  The retroperitoneal space was encountered.  The peritoneum was bluntly swept anteriorly, and the psoas was readily identified.  I did use a series of dilators to dock over the L4-5 intervertebral space.  I did use neurologic monitoring while placing the dilators, in order to ensure that there were no neurologic structures in the immediate vicinity of the dilators.  The lumbar plexus was noted to be posterior.  A self-retaining retractor was placed, and was attached to a rigid arm.  The retractor was very gently dilated and a shim was placed into the L4/5 intertebral space.  I then used a knife to perform an annulotomy at the lateral aspect of the L4-5 intervertebral space.  I then used a series of curettes and pituitary rongeurs in order to perform a thorough and complete L4-5 intervertebral diskectomy.  The contralateral annulus was released.   I then placed a series of intervertebral spacer trials, and I did feel that a 10 x 18 mm x 55 mm spacer would be the most appropriate fit. The appropriate spacer was then packed with ViviGen and tamped into position.  I was very pleased with the final resting position of the intervertebral spacer.  The retractor was then removed, and through the same incision, I did place a dilator through the psoas musculature over the L3-4 intervertebral space while the peritoneum continue to be swept anteriorly.  I did use a series of dilators to dock over the L3-4 intervertebral space.  I did use neurologic monitoring while placing the dilators, in order to ensure that there were no neurologic structures in the immediate vicinity of the dilators.  The lumbar plexus was noted to be posterior.  A self-retaining retractor was placed, and was attached to the rigid arm.  The retractor was very gently dilated and a shim was placed into the L3-4 intertebral space.  I then used a knife to perform an annulotomy at the lateral aspect of the L3-4 intervertebral space.  I then used a series of curettes and pituitary rongeurs in order to perform a thorough and complete L3-4 intervertebral diskectomy.  The contralateral annulus was released.  I then placed a series of intervertebral spacer trials, and I did feel that a 8 x 18 mm x 55 mm spacer would be the most appropriate fit. The appropriate spacer was then packed with ViviGen and tamped into position.  I was very pleased with the final resting position of the intervertebral spacer.  Of note, the height of the very collapsed contralateral intervertebral disc space at L3-4 was restored significantly. The retractor was then removed, I was very pleased with the final AP and lateral fluoroscopic images. At this point, the wound was copiously irrigated.  The fascia, internal, and external oblique musculature was closed using #1 Vicryl.  The subcutaneous layer was closed using 2-0 Vicryl and the  skin was closed using 4-0 Monocryl.  Benzoin and Steri-Strips were applied followed by sterile dressing.      Of note, I  did use neurologic monitoring throughout the entire surgery, and there was no sustained EMG activity noted throughout the entire surgery. All instrument counts were correct at the termination of the procedure.   Of note, Pricilla Holm was my assistant throughout surgery, and did aid in retraction, handling of instruments, suctioning, and closure.

## 2018-04-28 NOTE — Anesthesia Procedure Notes (Signed)
Arterial Line Insertion Start/End4/15/2020 8:20 AM, 04/28/2018 8:35 AM Performed by: Roberts Gaudy, MD, Milford Cage, CRNA, CRNA  Patient location: Pre-op. Preanesthetic checklist: patient identified, IV checked, site marked, risks and benefits discussed, surgical consent, monitors and equipment checked, pre-op evaluation, timeout performed and anesthesia consent Lidocaine 1% used for infiltration Right, radial was placed Catheter size: 20 G Hand hygiene performed , maximum sterile barriers used  and Seldinger technique used  Attempts: 2 Procedure performed without using ultrasound guided technique. Following insertion, dressing applied. Post procedure assessment: normal and unchanged  Patient tolerated the procedure well with no immediate complications.

## 2018-04-28 NOTE — Progress Notes (Signed)
Orthopedic Tech Progress Note Patient Details:  [name] 1950-05-15 [id number] RN said patient has brace Patient ID: [name], female   DOB: 18-Jul-1950, 68 y.o.   MRN: [id number]   [name] 04/28/2018, 2:52 PM

## 2018-04-28 NOTE — H&P (Addendum)
PREOPERATIVE H&P  Chief Complaint: Left leg pain, low back pain  HPI: [name] is a 68 y.o. female who presents with ongoing and severe pain in the left leg.  Patient is status post a microdiscectomy on the right at L3-4.  She did very well from surgery initially, but did subsequently develop significant pain in the left leg.  This did become debilitating, progressing to prominent right leg weakness, as well as severe pain in the low back.  Her work-up did reveal degenerative disc disease at L3-4 and L4-5, with severe left-sided neuroforaminal narrowing at L3-4.  We did discuss treatment options.  Given her debilitating pain and weakness, she did wish to proceed with surgery.  The patient has been having substantial difficulty with routine activities of daily living, such as getting around the house and using the bathroom.  MRI reveals degenerative disc disease and severe neuroforaminal narrowing on the left at L3-4, with substantial segmental collapse particularly across L3-4.  Patient has failed multiple forms of conservative care and continues to have pain (see office notes for additional details regarding the patient's full course of treatment)  Past Medical History:  Diagnosis Date  . Anxiety   . Arthritis    hands, feet  . Back pain    chronic  . Cataracts, bilateral    MD just watching right now  . Cystitides, interstitial, chronic   . Depression   . Fibromyalgia   . GERD (gastroesophageal reflux disease)   . Gout    feet  . High cholesterol   . Hypertension   . Shortness of breath    with exertion  . Sleep apnea    Uses CPAP nightly  . SVD (spontaneous vaginal delivery)    x 3  . Urinary disorder    bladder is in sling per pt  . Urinary tract infection   . Vertigo   . Wears glasses    Past Surgical History:  Procedure Laterality Date  . ABDOMINAL HYSTERECTOMY  1997   [place] [facility]  10/20/2017  . bladder tack    .  COLONOSCOPY N/A 08/23/2014   Procedure: COLONOSCOPY;  Surgeon: Rogene Houston, MD;  Location: [facility];  Service: Endoscopy;  Laterality: N/A;  1200  . KNEE SURGERY Left 05/26/14   meniscus tear  . LAPAROSCOPIC SALPINGO OOPHERECTOMY  02/17/2012   Procedure: LAPAROSCOPIC SALPINGO OOPHORECTOMY;  Surgeon: Jonnie Kind, MD;  Location: [facility];  Service: Gynecology;  Laterality: Bilateral;  . LASIK Bilateral   . TOE SURGERY    . UPPER GI ENDOSCOPY     Social History   Socioeconomic History  . Marital status: Married    Spouse name: Not on file  . Number of children: Not on file  . Years of education: college  . Highest education level: Not on file  Occupational History    Employer: UNEMPLOYED  Social Needs  . Financial resource strain: Not on file  . Food insecurity:    Worry: Not on file    Inability: Not on file  . Transportation needs:    Medical: Not on file    Non-medical: Not on file  Tobacco Use  . Smoking status: Never Smoker  . Smokeless tobacco: Never Used  Substance and Sexual Activity  . Alcohol use: Not Currently    Alcohol/week: 0.0 standard drinks    Comment: occassional, but patient states "currently does not drink"  . Drug use: No  .  Sexual activity: Yes    Birth control/protection: Surgical, Post-menopausal    Comment: hysterectomy  Lifestyle  . Physical activity:    Days per week: Not on file    Minutes per session: Not on file  . Stress: Not on file  Relationships  . Social connections:    Talks on phone: Not on file    Gets together: Not on file    Attends religious service: Not on file    Active member of club or organization: Not on file    Attends meetings of clubs or organizations: Not on file    Relationship status: Not on file  Other Topics Concern  . Not on file  Social History Narrative   Patient lives at home with her family.   Caffeine Use: 1 cup daily   Family History  Problem Relation Age of Onset  . Atrial fibrillation  Mother   . COPD Mother   . Hypertension Mother   . Hypertension Father   . Cancer Father        throat   . Drug abuse Brother   . Alcohol abuse Brother   . Early death Brother   . Fibromyalgia Daughter   . Paranoid behavior Son   . Alcohol abuse Son   . Hypertension Brother   . Sleep apnea Brother   . Alcohol abuse Brother   . Drug abuse Brother    Allergies  Allergen Reactions  . Lyrica [Pregabalin]     UNSPECIFIED REACTION    Prior to Admission medications   Medication Sig Start Date End Date Taking? Authorizing Provider  acetaminophen (TYLENOL) 500 MG tablet Take 500-1,000 mg by mouth every 6 (six) hours as needed (pain.).    Yes [provider]  ALPRAZolam Duanne Moron) 0.5 MG tablet Take 0.5 mg by mouth 2 (two) times daily.  08/13/17  Yes [provider]  amLODipine (NORVASC) 10 MG tablet Take 5 mg by mouth daily.   Yes [provider]  CAMPHOR-MENTHOL EX Apply 1 application topically 3 (three) times daily as needed (for pain). [place] Gel   Yes [provider]  Cannabidiol POWD Apply 1 application topically 3 (three) times daily as needed (for pain.). CBD HEMP 300 Pain Relief Cream   Yes [provider]  Cholecalciferol (VITAMIN D3) 1.25 MG (50000 UT) TABS Take 50,000 Units by mouth every Thursday.   Yes [provider]  Cyanocobalamin (VITAMIN B-12 PO) Take 1 tablet by mouth daily.   Yes [provider]  cyclobenzaprine (FLEXERIL) 5 MG tablet Take 5-10 mg by mouth at bedtime as needed for spasms. 02/05/18  Yes [provider]  DULoxetine (CYMBALTA) 60 MG capsule Take 1 capsule (60 mg total) by mouth 2 (two) times daily. Patient taking differently: Take 60 mg by mouth at bedtime.  08/01/16  Yes Cloria Spring, MD  fluticasone [facility]) 50 MCG/ACT nasal spray Place 1 spray into both nostrils daily as needed for allergies.    Yes [provider]  HYDROcodone-acetaminophen (NORCO) 10-325 MG tablet Take 1  tablet by mouth every 8 (eight) hours as needed (pain).   Yes [provider]  lisinopril-hydrochlorothiazide (PRINZIDE,ZESTORETIC) 20-12.5 MG per tablet Take 1 tablet by mouth 2 (two) times daily.   Yes [provider]  Melatonin 10 MG TABS Take 10 mg by mouth at bedtime.   Yes [provider]  Menthol, Topical Analgesic, (MUSCLE & JOINT EX) Apply 1 application topically 3 (three) times daily as needed (pain.). Muscle Joint Relief  Rub   Yes [provider]  metoprolol (LOPRESSOR) 100 MG tablet Take 100 mg by mouth 2 (two) times daily.    Yes [provider]  Naphazoline-Pheniramine (OPCON-A OP) Place 1 drop into both eyes 3 (three) times daily as needed (dry/irritated eyes.).   Yes [provider]  Omega-3 Fatty Acids (FISH OIL) 1200 MG CAPS Take 2,400 mg by mouth daily.   Yes [provider]  pantoprazole (PROTONIX) 20 MG tablet Take 1 tablet (20 mg total) by mouth daily. 03/28/18  Yes Milton Ferguson, MD  Probiotic Product (PROBIOTIC DAILY PO) Take 1 capsule by mouth daily.    Yes [provider]  simvastatin (ZOCOR) 20 MG tablet Take 10 mg by mouth at bedtime.    Yes [provider]  HYDROcodone-acetaminophen (NORCO/VICODIN) 5-325 MG tablet Take 1 tablet by mouth every 6 (six) hours as needed. Patient not taking: Reported on 04/16/2018 03/28/18   Milton Ferguson, MD  predniSONE (DELTASONE) 10 MG tablet Take 2 tablets (20 mg total) by mouth daily. Patient not taking: Reported on 04/16/2018 03/28/18   Milton Ferguson, MD     All other systems have been reviewed and were otherwise negative with the exception of those mentioned in the HPI and as above.  Physical Exam: Vitals:   04/28/18 0637  BP: (!) 146/53  Pulse: (!) 59  Resp: 18  Temp: 98.1 F (36.7 C)  SpO2: 99%    Body mass index is 34.41 kg/m.  General: Alert, no acute distress Cardiovascular: No pedal edema Respiratory: No cyanosis, no use of accessory  musculature Skin: No lesions in the area of chief complaint Neurologic: + weakness to left hip flexion and knee extension Psychiatric: Patient is competent for consent with normal mood and affect Lymphatic: No axillary or cervical lymphadenopathy  MUSCULOSKELETAL: + SLR on the left  Assessment/Plan: Spinal stenosis and DDD L3/4 and L4/5  Plan for Procedure(s): RIGHT-SIDED LATERAL INTERBODYBODY LUMBAR FUSION L3-4, L4-5 WITH INSTRUMENTATION AND ALLOGRAFT  *Of note, the patient's pain has been debilitating, and her weakness has been significant.  She has been having substantial difficulty performing routine activities of daily living.  I do feel that delaying her surgery for more than an additional month would result in substantial undue stress and potentially progressive neurologic deficit, which could be permanent.  For these reasons, the patient myself did elect to proceed with surgery on an urgent basis.   Norva Karvonen, MD 04/28/2018 8:04 AM

## 2018-04-28 NOTE — Anesthesia Preprocedure Evaluation (Addendum)
Anesthesia Evaluation  Patient identified by MRN, date of birth, ID band Patient awake    Reviewed: Allergy & Precautions, NPO status , Patient's Chart, lab work & pertinent test results, reviewed documented beta blocker date and time   History of Anesthesia Complications Negative for: history of anesthetic complications  Airway Mallampati: I  TM Distance: >3 FB Neck ROM: Full    Dental  (+) Dental Advisory Given   Pulmonary sleep apnea and Continuous Positive Airway Pressure Ventilation ,    breath sounds clear to auscultation       Cardiovascular hypertension, Pt. on medications and Pt. on home beta blockers (-) angina Rhythm:Regular Rate:Normal  '18 stress Dobutamine: normal study   Neuro/Psych Anxiety Depression Chronic back pain: narcotics    GI/Hepatic GERD  Medicated and Controlled,(+)     substance abuse  ,   Endo/Other  Morbid obesity  Renal/GU negative Renal ROS     Musculoskeletal  (+) Arthritis , Fibromyalgia -, narcotic dependent  Abdominal (+) + obese,   Peds  Hematology   Anesthesia Other Findings   Reproductive/Obstetrics                            Anesthesia Physical Anesthesia Plan  ASA: III  Anesthesia Plan: General   Post-op Pain Management:    Induction: Intravenous  PONV Risk Score and Plan: 4 or greater and Scopolamine patch - Pre-op, Ondansetron and Dexamethasone  Airway Management Planned: Oral ETT  Additional Equipment: CVP and Arterial line  Intra-op Plan:   Post-operative Plan: Extubation in OR  Informed Consent: I have reviewed the patients History and Physical, chart, labs and discussed the procedure including the risks, benefits and alternatives for the proposed anesthesia with the patient or authorized representative who has indicated his/her understanding and acceptance.     Dental advisory given  Plan Discussed with: CRNA and  Surgeon  Anesthesia Plan Comments: (Plan routine monitors, existing central and arterial lines, GETA)       Anesthesia Quick Evaluation

## 2018-04-28 NOTE — Transfer of Care (Signed)
Immediate Anesthesia Transfer of Care Note  Patient: [name]  Procedure(s) Performed: ANTERIOR LATERAL INNER BODY LUMBAR FUSION L3-4, L4-5 2 LEVELS WITH INSTRUMENTATION AND ALLOGRAFT (Right Back)  Patient Location: PACU  Anesthesia Type:General  Level of Consciousness: drowsy  Airway & Oxygen Therapy: Patient Spontanous Breathing and Patient connected to face mask oxygen  Post-op Assessment: Report given to RN and Post -op Vital signs reviewed and stable  Post vital signs: Reviewed and stable  Last Vitals:  Vitals Value Taken Time  BP 132/57 04/28/2018 12:00 PM  Temp    Pulse 74 04/28/2018 12:01 PM  Resp 21 04/28/2018 12:01 PM  SpO2 99 % 04/28/2018 12:01 PM  Vitals shown include unvalidated device data.  Last Pain:  Vitals:   04/28/18 0735  TempSrc:   PainSc: 0-No pain         Complications: No apparent anesthesia complications

## 2018-04-29 ENCOUNTER — Inpatient Hospital Stay (HOSPITAL_COMMUNITY): Payer: Medicare Other | Admitting: Anesthesiology

## 2018-04-29 ENCOUNTER — Inpatient Hospital Stay (HOSPITAL_COMMUNITY): Admission: RE | Admit: 2018-04-29 | Payer: Medicare Other | Source: Home / Self Care | Admitting: Orthopedic Surgery

## 2018-04-29 ENCOUNTER — Encounter (HOSPITAL_COMMUNITY): Payer: Self-pay | Admitting: Orthopedic Surgery

## 2018-04-29 ENCOUNTER — Encounter (HOSPITAL_COMMUNITY)
Admission: RE | Disposition: A | Discharge status: Home / Self Care | Payer: Self-pay | Source: Home / Self Care | Attending: Orthopedic Surgery

## 2018-04-29 ENCOUNTER — Inpatient Hospital Stay (HOSPITAL_COMMUNITY): Payer: Medicare Other

## 2018-04-29 SURGERY — POSTERIOR LUMBAR FUSION 2 LEVEL
Anesthesia: General | Site: Spine Lumbar

## 2018-04-29 MED ORDER — PROPOFOL 10 MG/ML IV BOLUS
INTRAVENOUS | Status: DC | PRN
  Administered 2018-04-29: 20 mg via INTRAVENOUS
  Administered 2018-04-29: 30 mg via INTRAVENOUS
  Administered 2018-04-29: 200 mg via INTRAVENOUS
  Administered 2018-04-29: 50 mg via INTRAVENOUS

## 2018-04-29 MED ORDER — BUPIVACAINE-EPINEPHRINE (PF) 0.25% -1:200000 IJ SOLN
INTRAMUSCULAR | Status: AC
  Filled 2018-04-29: qty 30

## 2018-04-29 MED ORDER — PROMETHAZINE HCL 25 MG/ML IJ SOLN: 6.2500 mg | INTRAMUSCULAR | Status: DC | PRN

## 2018-04-29 MED ORDER — MIDAZOLAM HCL 2 MG/2ML IJ SOLN: 0.5000 mg | Freq: Once | INTRAMUSCULAR | Status: DC | PRN

## 2018-04-29 MED ORDER — DIAZEPAM 5 MG PO TABS
ORAL_TABLET | ORAL | Status: AC
  Filled 2018-04-29: qty 1

## 2018-04-29 MED ORDER — SCOPOLAMINE 1 MG/3DAYS TD PT72
1.0000 | MEDICATED_PATCH | Freq: Once | TRANSDERMAL | Status: DC
  Administered 2018-04-29: 1.5 mg via TRANSDERMAL
  Filled 2018-04-29: qty 1

## 2018-04-29 MED ORDER — ESMOLOL HCL 100 MG/10ML IV SOLN
INTRAVENOUS | Status: DC | PRN
  Administered 2018-04-29: 50 mg via INTRAVENOUS

## 2018-04-29 MED ORDER — LACTATED RINGERS IV SOLN
INTRAVENOUS | Status: DC | PRN
  Administered 2018-04-29: 07:00:00 via INTRAVENOUS

## 2018-04-29 MED ORDER — BUPIVACAINE LIPOSOME 1.3 % IJ SUSP
INTRAMUSCULAR | Status: DC | PRN
  Administered 2018-04-29: 20 mL

## 2018-04-29 MED ORDER — BUPIVACAINE-EPINEPHRINE 0.25% -1:200000 IJ SOLN
INTRAMUSCULAR | Status: DC | PRN
  Administered 2018-04-29: 10 mL
  Administered 2018-04-29: 20 mL

## 2018-04-29 MED ORDER — DEXAMETHASONE SODIUM PHOSPHATE 10 MG/ML IJ SOLN
INTRAMUSCULAR | Status: DC | PRN
  Administered 2018-04-29: 10 mg via INTRAVENOUS

## 2018-04-29 MED ORDER — ACETAMINOPHEN 10 MG/ML IV SOLN
INTRAVENOUS | Status: AC
  Filled 2018-04-29: qty 100

## 2018-04-29 MED ORDER — SODIUM CHLORIDE 0.9 % IV SOLN
INTRAVENOUS | Status: DC | PRN
  Administered 2018-04-29: 60 ug/min via INTRAVENOUS

## 2018-04-29 MED ORDER — THROMBIN 20000 UNITS EX SOLR
CUTANEOUS | Status: DC | PRN
  Administered 2018-04-29: 07:00:00 via TOPICAL

## 2018-04-29 MED ORDER — MEPERIDINE HCL 50 MG/ML IJ SOLN: 6.2500 mg | INTRAMUSCULAR | Status: DC | PRN

## 2018-04-29 MED ORDER — POVIDONE-IODINE 7.5 % EX SOLN
Freq: Once | CUTANEOUS | Status: DC
  Filled 2018-04-29: qty 118

## 2018-04-29 MED ORDER — EPHEDRINE SULFATE-NACL 50-0.9 MG/10ML-% IV SOSY
PREFILLED_SYRINGE | INTRAVENOUS | Status: DC | PRN
  Administered 2018-04-29: 5 mg via INTRAVENOUS
  Administered 2018-04-29: 10 mg via INTRAVENOUS

## 2018-04-29 MED ORDER — THROMBIN 20000 UNITS EX SOLR
CUTANEOUS | Status: AC
  Filled 2018-04-29: qty 20000

## 2018-04-29 MED ORDER — PROPOFOL 500 MG/50ML IV EMUL
INTRAVENOUS | Status: DC | PRN
  Administered 2018-04-29: 50 ug/kg/min via INTRAVENOUS

## 2018-04-29 MED ORDER — ACETAMINOPHEN 10 MG/ML IV SOLN
INTRAVENOUS | Status: DC | PRN
  Administered 2018-04-29: 1000 mg via INTRAVENOUS

## 2018-04-29 MED ORDER — SUCCINYLCHOLINE CHLORIDE 20 MG/ML IJ SOLN
INTRAMUSCULAR | Status: DC | PRN
  Administered 2018-04-29: 200 mg via INTRAVENOUS

## 2018-04-29 MED ORDER — HYDROMORPHONE HCL 1 MG/ML IJ SOLN
0.2500 mg | INTRAMUSCULAR | Status: DC | PRN
  Administered 2018-04-29: 10:00:00 0.5 mg via INTRAVENOUS

## 2018-04-29 MED ORDER — PHENYLEPHRINE 40 MCG/ML (10ML) SYRINGE FOR IV PUSH (FOR BLOOD PRESSURE SUPPORT)
PREFILLED_SYRINGE | INTRAVENOUS | Status: DC | PRN
  Administered 2018-04-29: 120 ug via INTRAVENOUS
  Administered 2018-04-29 (×2): 80 ug via INTRAVENOUS

## 2018-04-29 MED ORDER — LIDOCAINE 2% (20 MG/ML) 5 ML SYRINGE
INTRAMUSCULAR | Status: DC | PRN
  Administered 2018-04-29: 40 mg via INTRAVENOUS

## 2018-04-29 MED ORDER — PROPOFOL 1000 MG/100ML IV EMUL
INTRAVENOUS | Status: AC
  Filled 2018-04-29: qty 100

## 2018-04-29 MED ORDER — CEFAZOLIN SODIUM-DEXTROSE 2-4 GM/100ML-% IV SOLN
2.0000 g | INTRAVENOUS | Status: AC
  Administered 2018-04-29: 2 g via INTRAVENOUS
  Filled 2018-04-29: qty 100

## 2018-04-29 MED ORDER — FENTANYL CITRATE (PF) 100 MCG/2ML IJ SOLN
INTRAMUSCULAR | Status: DC | PRN
  Administered 2018-04-29 (×3): 50 ug via INTRAVENOUS
  Administered 2018-04-29: 250 ug via INTRAVENOUS

## 2018-04-29 MED ORDER — 0.9 % SODIUM CHLORIDE (POUR BTL) OPTIME
TOPICAL | Status: DC | PRN
  Administered 2018-04-29: 1000 mL

## 2018-04-29 MED ORDER — MIDAZOLAM HCL 5 MG/5ML IJ SOLN
INTRAMUSCULAR | Status: DC | PRN
  Administered 2018-04-29: 2 mg via INTRAVENOUS

## 2018-04-29 MED ORDER — BUPIVACAINE LIPOSOME 1.3 % IJ SUSP
20.0000 mL | INTRAMUSCULAR | Status: DC
  Filled 2018-04-29: qty 20

## 2018-04-29 MED ORDER — ONDANSETRON HCL 4 MG/2ML IJ SOLN
INTRAMUSCULAR | Status: DC | PRN
  Administered 2018-04-29: 4 mg via INTRAVENOUS

## 2018-04-29 MED ORDER — HYDROMORPHONE HCL 1 MG/ML IJ SOLN
INTRAMUSCULAR | Status: AC
  Filled 2018-04-29: qty 1

## 2018-04-29 MED ORDER — METHYLENE BLUE 0.5 % INJ SOLN
INTRAVENOUS | Status: AC
  Filled 2018-04-29: qty 10

## 2018-04-29 SURGICAL SUPPLY — 100 items
APL SKNCLS STERI-STRIP NONHPOA (GAUZE/BANDAGES/DRESSINGS) ×1
BENZOIN TINCTURE PRP APPL 2/3 (GAUZE/BANDAGES/DRESSINGS) ×2 IMPLANT
BLADE CLIPPER SURG (BLADE) IMPLANT
BONE VIVIGEN FORMABLE 1.3CC (Bone Implant) ×3 IMPLANT
BUR PRECISION FLUTE 5.0 (BURR) ×2 IMPLANT
BUR PRESCISION 1.7 ELITE (BURR) IMPLANT
BUR ROUND PRECISION 4.0 (BURR) ×1 IMPLANT
BUR ROUND PRECISION 4.0MM (BURR) ×1
BUR SABER RD CUTTING 3.0 (BURR) IMPLANT
BUR SABER RD CUTTING 3.0MM (BURR)
CARTRIDGE OIL MAESTRO DRILL (MISCELLANEOUS) ×2 IMPLANT
CLOSURE STERI-STRIP 1/2X4 (GAUZE/BANDAGES/DRESSINGS) ×1
CLOSURE WOUND 1/2 X4 (GAUZE/BANDAGES/DRESSINGS)
CLSR STERI-STRIP ANTIMIC 1/2X4 (GAUZE/BANDAGES/DRESSINGS) ×1 IMPLANT
CONT SPEC 4OZ CLIKSEAL STRL BL (MISCELLANEOUS) ×3 IMPLANT
COVER SURGICAL LIGHT HANDLE (MISCELLANEOUS) ×1 IMPLANT
COVER WAND RF STERILE (DRAPES) ×3 IMPLANT
DIFFUSER DRILL AIR PNEUMATIC (MISCELLANEOUS) ×6 IMPLANT
DRAIN CHANNEL 15F RND FF W/TCR (WOUND CARE) ×1 IMPLANT
DRAPE C-ARM 42X72 X-RAY (DRAPES) ×3 IMPLANT
DRAPE C-ARMOR (DRAPES) ×2 IMPLANT
DRAPE POUCH INSTRU U-SHP 10X18 (DRAPES) ×1 IMPLANT
DRAPE SURG 17X23 STRL (DRAPES) ×12 IMPLANT
DRSG MEPILEX BORDER 4X12 (GAUZE/BANDAGES/DRESSINGS) IMPLANT
DRSG MEPILEX BORDER 4X8 (GAUZE/BANDAGES/DRESSINGS) IMPLANT
DURAPREP 26ML APPLICATOR (WOUND CARE) ×3 IMPLANT
ELECT BLADE 4.0 EZ CLEAN MEGAD (MISCELLANEOUS) ×3
ELECT CAUTERY BLADE 6.4 (BLADE) ×6 IMPLANT
ELECT REM PT RETURN 9FT ADLT (ELECTROSURGICAL) ×3
ELECTRODE BLDE 4.0 EZ CLN MEGD (MISCELLANEOUS) ×1 IMPLANT
ELECTRODE REM PT RTRN 9FT ADLT (ELECTROSURGICAL) ×1 IMPLANT
EVACUATOR SILICONE 100CC (DRAIN) ×1 IMPLANT
FEE INTRAOP MONITOR IMPULS NCS (MISCELLANEOUS) IMPLANT
GAUZE 4X4 16PLY RFD (DISPOSABLE) ×6 IMPLANT
GAUZE SPONGE 4X4 12PLY STRL (GAUZE/BANDAGES/DRESSINGS) ×1 IMPLANT
GAUZE SPONGE 4X4 12PLY STRL LF (GAUZE/BANDAGES/DRESSINGS) ×2 IMPLANT
GLOVE BIO SURGEON STRL SZ7 (GLOVE) ×3 IMPLANT
GLOVE BIO SURGEON STRL SZ8 (GLOVE) ×3 IMPLANT
GLOVE BIOGEL PI IND STRL 7.0 (GLOVE) ×1 IMPLANT
GLOVE BIOGEL PI IND STRL 8 (GLOVE) ×1 IMPLANT
GLOVE BIOGEL PI INDICATOR 7.0 (GLOVE) ×2
GLOVE BIOGEL PI INDICATOR 8 (GLOVE) ×2
GOWN STRL REUS W/ TWL LRG LVL3 (GOWN DISPOSABLE) ×2 IMPLANT
GOWN STRL REUS W/ TWL XL LVL3 (GOWN DISPOSABLE) ×1 IMPLANT
GOWN STRL REUS W/TWL LRG LVL3 (GOWN DISPOSABLE) ×9
GOWN STRL REUS W/TWL XL LVL3 (GOWN DISPOSABLE) ×3
GRAFT BNE MATRIX VG FRMBL SM 1 (Bone Implant) IMPLANT
GUIDEWIRE BLUNT VIPER II 1.45 (WIRE) ×2 IMPLANT
GUIDEWIRE SHARP VIPER II (WIRE) ×16 IMPLANT
INTRAOP MONITOR FEE IMPULS NCS (MISCELLANEOUS) ×1
INTRAOP MONITOR FEE IMPULSE (MISCELLANEOUS) ×2
IV CATH 14GX2 1/4 (CATHETERS) ×3 IMPLANT
KIT ALARA NEURO ACCESS (KITS) ×4 IMPLANT
KIT BASIN OR (CUSTOM PROCEDURE TRAY) ×3 IMPLANT
KIT POSITION SURG JACKSON T1 (MISCELLANEOUS) ×3 IMPLANT
KIT TURNOVER KIT B (KITS) ×3 IMPLANT
MARKER SKIN DUAL TIP RULER LAB (MISCELLANEOUS) ×3 IMPLANT
NDL HYPO 25GX1X1/2 BEV (NEEDLE) ×1 IMPLANT
NDL SPNL 18GX3.5 QUINCKE PK (NEEDLE) ×2 IMPLANT
NEEDLE HYPO 25GX1X1/2 BEV (NEEDLE) ×3 IMPLANT
NEEDLE SPNL 18GX3.5 QUINCKE PK (NEEDLE) ×3 IMPLANT
NS IRRIG 1000ML POUR BTL (IV SOLUTION) ×3 IMPLANT
OIL CARTRIDGE MAESTRO DRILL (MISCELLANEOUS) ×3
PACK LAMINECTOMY NEURO (CUSTOM PROCEDURE TRAY) ×2 IMPLANT
PACK LAMINECTOMY ORTHO (CUSTOM PROCEDURE TRAY) ×1 IMPLANT
PACK UNIVERSAL I (CUSTOM PROCEDURE TRAY) ×3 IMPLANT
PAD ARMBOARD 7.5X6 YLW CONV (MISCELLANEOUS) ×6 IMPLANT
PATTIES SURGICAL .5 X1 (DISPOSABLE) ×3 IMPLANT
PATTIES SURGICAL .5 X3 (DISPOSABLE) IMPLANT
PATTIES SURGICAL .5X1.5 (GAUZE/BANDAGES/DRESSINGS) ×3 IMPLANT
PATTIES SURGICAL .75X.75 (GAUZE/BANDAGES/DRESSINGS) ×3 IMPLANT
PROBE PEDCLE PROBE MAGSTM DISP (MISCELLANEOUS) ×2 IMPLANT
ROD VIPOR2 70MM PRE LARDOSED (Rod) ×4 IMPLANT
SCREW SET SINGLE INNER MIS (Screw) ×12 IMPLANT
SCREW XTAB POLY VIPER  7X45 (Screw) ×12 IMPLANT
SCREW XTAB POLY VIPER 7X45 (Screw) IMPLANT
SPONGE INTESTINAL PEANUT (DISPOSABLE) ×2 IMPLANT
SPONGE SURGIFOAM ABS GEL 100 (HEMOSTASIS) ×2 IMPLANT
STRIP CLOSURE SKIN 1/2X4 (GAUZE/BANDAGES/DRESSINGS) IMPLANT
SURGIFLO W/THROMBIN 8M KIT (HEMOSTASIS) IMPLANT
SUT MNCRL AB 4-0 PS2 18 (SUTURE) ×5 IMPLANT
SUT VIC AB 0 CT1 18XCR BRD 8 (SUTURE) ×2 IMPLANT
SUT VIC AB 0 CT1 18XCR BRD8 (SUTURE) IMPLANT
SUT VIC AB 0 CT1 8-18 (SUTURE) ×9
SUT VIC AB 1 CT1 18XBRD ANBCTR (SUTURE) IMPLANT
SUT VIC AB 1 CT1 18XCR BRD 8 (SUTURE) ×2 IMPLANT
SUT VIC AB 1 CT1 8-18 (SUTURE) ×9
SUT VIC AB 2-0 CP2 18 (SUTURE) ×2 IMPLANT
SUT VIC AB 2-0 CT2 18 VCP726D (SUTURE) ×8 IMPLANT
SYR 20CC LL (SYRINGE) ×3 IMPLANT
SYR BULB IRRIGATION 50ML (SYRINGE) ×3 IMPLANT
SYR CONTROL 10ML LL (SYRINGE) ×6 IMPLANT
SYR TB 1ML LUER SLIP (SYRINGE) ×3 IMPLANT
TAP CANN VIPER2 DL 6.0 (TAP) ×4 IMPLANT
TAPE CLOTH SURG 4X10 WHT LF (GAUZE/BANDAGES/DRESSINGS) ×2 IMPLANT
TOWEL OR 17X24 6PK STRL BLUE (TOWEL DISPOSABLE) ×3 IMPLANT
TOWEL OR 17X26 10 PK STRL BLUE (TOWEL DISPOSABLE) ×3 IMPLANT
TRAY FOLEY MTR SLVR 16FR STAT (SET/KITS/TRAYS/PACK) ×1 IMPLANT
WATER STERILE IRR 1000ML POUR (IV SOLUTION) ×3 IMPLANT
YANKAUER SUCT BULB TIP NO VENT (SUCTIONS) ×3 IMPLANT

## 2018-04-29 NOTE — Progress Notes (Signed)
PT stated she was not able to tolerate CPAP las night, she wants to wear 02 thought the night. Will monitor pt.

## 2018-04-29 NOTE — Addendum Note (Signed)
Addendum  created 04/29/18 1254 by Milford Cage, CRNA   Intraprocedure Meds edited

## 2018-04-29 NOTE — Progress Notes (Signed)
Patient to OR and report called to anesthesia @ [phone].

## 2018-04-29 NOTE — Anesthesia Procedure Notes (Signed)
Procedure Name: Intubation Performed by: Milford Cage, CRNA Pre-anesthesia Checklist: Patient identified, Emergency Drugs available, Suction available and Patient being monitored Patient Re-evaluated:Patient Re-evaluated prior to induction Oxygen Delivery Method: Circle System Utilized Preoxygenation: Pre-oxygenation with 100% oxygen Induction Type: IV induction and Rapid sequence Laryngoscope Size: Glidescope and 3 Grade View: Grade I Tube type: Oral Tube size: 7.5 mm Number of attempts: 1 Airway Equipment and Method: Stylet and Oral airway Placement Confirmation: ETT inserted through vocal cords under direct vision,  positive ETCO2 and breath sounds checked- equal and bilateral Secured at: 23 cm Tube secured with: Tape Dental Injury: Teeth and Oropharynx as per pre-operative assessment

## 2018-04-29 NOTE — H&P (Signed)
Patient tolerated stage 1 of her procedure well yesterday.  Today, she reports minimal pain and is all smiles.  We will proceed with stage 2 of her procedure as planned.

## 2018-04-29 NOTE — Op Note (Signed)
[place] PZ:[id number]  DATE OF BIRTH:[birth year]  PHYSICIAN:DR. Ardian Haberland L. Anurag Scarfo  ASSISTANT: KAYLA MCKENZIE, PA-C  DATE OF PROCEDURE:04/29/2018                                        OPERATIVE REPORT   PREOPERATIVE DIAGNOSES: 1.Left-sided lumbar radiculopathy, manifesting as severe left leg pain and weakness 2.Degenerative disc disease, L4-5, L3-4 3.  Significant left-sided segmental collapse, L3-4 4.  Severe low back pain 5.  Status post previous L3-4 decompression 6.  S/p previous lateral interbody fusion requiring posterior fusion with instrumentation  POSTOPERATIVE DIAGNOSES: 1.Left-sided lumbar radiculopathy, manifesting as severe left leg pain and weakness 2.Degenerative disc disease, L4-5, L3-4 3.  Significant left-sided segmental collapse, L3-4 4.  Severe low back pain 5.  Status post previous L3-4 decompression 6.  S/p previous lateral interbody fusion requiring posterior fusion with instrumentation  PROCEDURE (stage 2 of 2):   1. Posterior spinal fusion, L3/4, L4/5 2. Placement of posterior segmental spinal fixation, L3, L4, L5 bilaterally (7 x 45 mm screws x 6) 3. Use of allograft - Vivigen 4.  Intraoperative use of fluoroscopy  SURGEON:  Phylliss Bob, MD  ASSISTANT:  Pricilla Holm, PA-C  ANESTHESIA:  General endotracheal anesthesia.  COMPLICATIONS:  None.  DISPOSITION:  Stable.  ESTIMATED BLOOD LOSS:  Minimal  INDICATIONS FOR SURGERY: Briefly, Ms. Cumi is one day status post a lateral lumbar fusion, as noted above.  Please refer to my operative report dated 04/28/2018, for a full account of the patient's preoperative history and indications for surgery.  The patient did present today for stage 2 of what was to be a 2-staged procedure.  OPERATIVE DETAILS:  On 04/29/2018, the patient was brought to surgery and general endotracheal anesthesia was administered.  The patient was placed prone onto a Jackson  spinal bed.  The back was then prepped and draped in the usual sterile fashion.  I then made paramedian incisions on the right and left sides, just lateral to the lateral borders of the pedicles from L3-L5.  On the left side, the posterolateral gutter and posterior elements associated with the L3-4 and L4-5 levels  were identified and exposed and decorticated.  Vivigen was packed into the posterolateral gutter on the left side to aid in the success of the posterior fusion.  I then tapped the L3, L4, and L5 pedicles bilaterally using a 6 mm tap.  Of note, I did use neurologic monitoring and I did test each of the taps using triggered EMG.  There was no tap that tested below 15 milliamps.  I then placed 7 x 45 mm screws bilaterally at L3, L4, and L5 bilaterally.   75mm rods were then secured into the tulip heads of the screws bilaterally.  Caps were then placed and a final locking procedure was performed.  I was very pleased with the final AP and lateral fluoroscopic images.  The wound was then copiously irrigated.  On the right and left sides, the fascia was closed using #1 Vicryl.  The subcutaneous layer was closed using 0 Vicryl followed by 2-0 Vicryl, and the skin was then closed using 4-0 Monocryl. Benzoin and Steri-Strips were applied followed by sterile dressing.  All instrument counts were correct at the termination of the procedure. Again, I did use neurologic monitoring throughout the surgery, and there was no abnormal EMG activity noted throughout the surgery.  Of note, Pricilla Holm was my assistant throughout surgery, and did aid in retraction, suctioning, handling of instrumentation throughout the entire procedure.   Phylliss Bob, MD

## 2018-04-29 NOTE — Anesthesia Postprocedure Evaluation (Signed)
Anesthesia Post Note  Patient: [name]  Procedure(s) Performed: LEFT LUMBER THREE-FIVE POSTERIOR SPINAL FUSION WITH PERCUTANEOUS SCREWS AND ALLOGRAFT (N/A Spine Lumbar)     Patient location during evaluation: PACU Anesthesia Type: General Level of consciousness: awake and alert, patient cooperative and oriented Pain management: pain level controlled Vital Signs Assessment: post-procedure vital signs reviewed and stable Respiratory status: spontaneous breathing, nonlabored ventilation, respiratory function stable and patient connected to nasal cannula oxygen Cardiovascular status: blood pressure returned to baseline and stable Postop Assessment: no apparent nausea or vomiting Anesthetic complications: no    Last Vitals:  Vitals:   04/29/18 1027 04/29/18 1048  BP: 139/61 106/72  Pulse: 75 73  Resp: (!) 23   Temp: 36.5 C 36.9 C  SpO2: 95% 96%    Last Pain:  Vitals:   04/29/18 1100  TempSrc:   PainSc: 4                  Sahid Borba,E. Dayshon Roback

## 2018-04-29 NOTE — Anesthesia Postprocedure Evaluation (Signed)
Anesthesia Post Note  Patient: [name]  Procedure(s) Performed: ANTERIOR LATERAL INNER BODY LUMBAR FUSION L3-4, L4-5 2 LEVELS WITH INSTRUMENTATION AND ALLOGRAFT (Right Back)     Patient location during evaluation: PACU Anesthesia Type: General Level of consciousness: awake and alert Pain management: pain level controlled Vital Signs Assessment: post-procedure vital signs reviewed and stable Respiratory status: spontaneous breathing, nonlabored ventilation, respiratory function stable and patient connected to nasal cannula oxygen Cardiovascular status: blood pressure returned to baseline and stable Postop Assessment: no apparent nausea or vomiting Anesthetic complications: no    Last Vitals:  Vitals:   04/28/18 2354 04/29/18 0446  BP: 118/68 (!) 121/59  Pulse: 85 82  Resp:    Temp: 37.1 C 36.9 C  SpO2: 96% 98%    Last Pain:  Vitals:   04/29/18 0446  TempSrc: Oral  PainSc:                  Carolos Fecher COKER

## 2018-04-29 NOTE — Transfer of Care (Signed)
Immediate Anesthesia Transfer of Care Note  Patient: [name]  Procedure(s) Performed: LEFT LUMBER THREE-FIVE POSTERIOR SPINAL FUSION WITH PERCUTANEOUS SCREWS AND ALLOGRAFT (N/A Spine Lumbar)  Patient Location: PACU  Anesthesia Type:General  Level of Consciousness: awake  Airway & Oxygen Therapy: Patient Spontanous Breathing and Patient connected to face mask oxygen  Post-op Assessment: Report given to RN and Post -op Vital signs reviewed and stable  Post vital signs: Reviewed and stable  Last Vitals:  Vitals Value Taken Time  BP 137/75 04/29/2018  9:57 AM  Temp 36.6 C 04/29/2018  9:57 AM  Pulse 85 04/29/2018 10:04 AM  Resp 53 04/29/2018 10:04 AM  SpO2 98 % 04/29/2018 10:04 AM  Vitals shown include unvalidated device data.  Last Pain:  Vitals:   04/29/18 0957  TempSrc:   PainSc: 0-No pain         Complications: No apparent anesthesia complications

## 2018-04-29 NOTE — Evaluation (Signed)
Physical Therapy Evaluation Patient Details Name: [name] MRN: [id number] DOB: 1950-04-24 Today's Date: 04/29/2018   History of Present Illness  67 admitted due to LLE pain s/p lateral fusion L3-5 on 4/15 and 2nd stage PLIF L3-5 4/16. PMhx: Rt microdiscectomy L3-4, anxiety, depression, fibromyalgia, HTN, HLD, vertigo  Clinical Impression  Pt pleasant and very willing to mobilize but with some noted processing and memory deficits presumed due to medication. Pt educated for back precautions, brace wear, transfers, mobility and gait with handout provided. Pt with decreased ability with transfers, gait and adherence to precautions who will benefit from acute therapy to maximize independence and safety.     Follow Up Recommendations No PT follow up    Equipment Recommendations  Rolling walker with 5" wheels    Recommendations for Other Services OT consult     Precautions / Restrictions Precautions Precautions: Back Precaution Booklet Issued: Yes (comment) Required Braces or Orthoses: Spinal Brace Spinal Brace: Thoracolumbosacral orthotic;Applied in sitting position Restrictions Weight Bearing Restrictions: No      Mobility  Bed Mobility Overal bed mobility: Needs Assistance Bed Mobility: Rolling;Sidelying to Sit Rolling: Min guard Sidelying to sit: Min guard       General bed mobility comments: cues for safety and sequence  Transfers Overall transfer level: Needs assistance   Transfers: Sit to/from Stand Sit to Stand: Min guard         General transfer comment: cues for hand placement and posture  Ambulation/Gait Ambulation/Gait assistance: Min guard Gait Distance (Feet): 200 Feet Assistive device: Rolling walker (2 wheeled) Gait Pattern/deviations: Step-through pattern;Decreased stride length   Gait velocity interpretation: 1.31 - 2.62 ft/sec, indicative of limited community ambulator General Gait Details: cues for position in RW and directional  cues  Stairs            Wheelchair Mobility    Modified Rankin (Stroke Patients Only)       Balance Overall balance assessment: Needs assistance   Sitting balance-Leahy Scale: Good       Standing balance-Leahy Scale: Fair                               Pertinent Vitals/Pain Pain Assessment: 0-10 Pain Score: 3  Pain Location: incisions Pain Descriptors / Indicators: Aching;Sore Pain Intervention(s): Limited activity within patient's tolerance;Premedicated before session;Monitored during session;Repositioned    Home Living Family/patient expects to be discharged to:: Private residence Living Arrangements: Spouse/significant other;Children Available Help at Discharge: Family;Available 24 hours/day Type of Home: House Home Access: Stairs to enter   [employer] of Steps: 4 Home Layout: Multi-level Home Equipment: Cane - single point;Shower seat;Toilet riser      Prior Function Level of Independence: Independent               Hand Dominance        Extremity/Trunk Assessment   Upper Extremity Assessment Upper Extremity Assessment: Overall WFL for tasks assessed    Lower Extremity Assessment Lower Extremity Assessment: Overall WFL for tasks assessed    Cervical / Trunk Assessment Cervical / Trunk Assessment: Other exceptions Cervical / Trunk Exceptions: post surgical  Communication   Communication: No difficulties  Cognition Arousal/Alertness: Awake/alert;Suspect due to medications Behavior During Therapy: [facility] for tasks assessed/performed Overall Cognitive Status: Impaired/Different from baseline Area of Impairment: Memory;Problem solving                     Memory: Decreased short-term memory  Problem Solving: Slow processing;Requires verbal cues        General Comments      Exercises     Assessment/Plan    PT Assessment Patient needs continued PT services  PT Problem List Decreased activity  tolerance;Decreased mobility;Decreased knowledge of use of DME;Decreased safety awareness;Decreased knowledge of precautions       PT Treatment Interventions DME instruction;Functional mobility training;Balance training;Patient/family education;Gait training;Therapeutic activities;Stair training;Therapeutic exercise    PT Goals (Current goals can be found in the Care Plan section)  Acute Rehab PT Goals Patient Stated Goal: return home PT Goal Formulation: With patient Time For Goal Achievement: 05/13/18 Potential to Achieve Goals: Good    Frequency Min 5X/week   Barriers to discharge        Co-evaluation               AM-PAC PT "6 Clicks" Mobility  Outcome Measure Help needed turning from your back to your side while in a flat bed without using bedrails?: A Little Help needed moving from lying on your back to sitting on the side of a flat bed without using bedrails?: A Little Help needed moving to and from a bed to a chair (including a wheelchair)?: A Little Help needed standing up from a chair using your arms (e.g., wheelchair or bedside chair)?: A Little Help needed to walk in hospital room?: A Little Help needed climbing 3-5 steps with a railing? : A Little 6 Click Score: 18    End of Session Equipment Utilized During Treatment: Gait belt;Back brace Activity Tolerance: Patient tolerated treatment well Patient left: in chair;with call bell/phone within reach;with chair alarm set Nurse Communication: Mobility status;Precautions PT Visit Diagnosis: Other abnormalities of gait and mobility (R26.89)    Time: 1319-1400 PT Time Calculation (min) (ACUTE ONLY): 41 min   Charges:   PT Evaluation $PT Eval Moderate Complexity: 1 Mod PT Treatments $Gait Training: 8-22 mins        [place] Pager: [phone] Office: [place] 04/29/2018, 2:16 PM

## 2018-04-30 ENCOUNTER — Encounter (HOSPITAL_COMMUNITY): Payer: Self-pay | Admitting: Orthopedic Surgery

## 2018-04-30 MED ORDER — OXYCODONE-ACETAMINOPHEN 5-325 MG PO TABS: 1.0000 | ORAL_TABLET | ORAL | 0 refills | Status: DC | PRN

## 2018-04-30 MED ORDER — ONDANSETRON HCL 4 MG PO TABS: 4.0000 mg | ORAL_TABLET | Freq: Four times a day (QID) | ORAL | 0 refills | Status: DC | PRN

## 2018-04-30 MED ORDER — DIAZEPAM 5 MG PO TABS: 5.0000 mg | ORAL_TABLET | Freq: Four times a day (QID) | ORAL | 0 refills | Status: DC | PRN

## 2018-04-30 NOTE — TOC Transition Note (Signed)
Transition of Care [facility]) - CM/SW Discharge Note   Patient Details  Name: [name] MRN: [id number] Date of Birth: 05-26-1950  Transition of Care [facility]) CM/SW Contact:  Ella Bodo, RN Phone Number: 04/30/2018, 12:25 PM   Clinical Narrative:  67 admitted due to LLE pain s/p lateral fusion L3-5 on 4/15 and 2nd stage PLIF L3-5 4/16.  PTA, pt independent, lives at home with spouse.  PT recommending no OP follow up; DME for home use.  Referral to [place] for DME needs.  Spouse able to provide assistance at dc.          Barriers to Discharge: No Barriers Identified                       Discharge Plan and Services                DME Arranged: 3-N-1, Walker rolling DME Agency: [employer]           Readmission Risk Interventions Readmission Risk Prevention Plan 04/30/2018  Post Dischage Appt Not Complete  Appt Comments Office to call pt for follow up appt  Medication Screening Complete  Transportation Screening Complete  Some recent data might be hidden   Reinaldo Raddle, RN, BSN  Trauma/Neuro ICU Case Manager [phone]

## 2018-04-30 NOTE — Care Management Important Message (Signed)
Important Message  Patient Details  Name: [name] MRN: [id number] Date of Birth: 02-05-1950   Medicare Important Message Given:  Yes    Rosan Calbert [address]ine Circle 04/30/2018, 3:32 PM

## 2018-04-30 NOTE — Evaluation (Signed)
Occupational Therapy Evaluation Patient Details Name: [name] MRN: [id number] DOB: [birth year] Today's Date: 04/30/2018    History of Present Illness 67 admitted due to LLE pain s/p lateral fusion L3-5 on 4/15 and 2nd stage PLIF L3-5 4/16. PMhx: Rt microdiscectomy L3-4, anxiety, depression, fibromyalgia, HTN, HLD, vertigo   Clinical Impression   Patient evaluated by Occupational Therapy with no further acute OT needs identified. All education has been completed and the patient has no further questions. Pt is able to perform ADLs with supervision to mod A (for socks and shoes).   All instruction completed.  See below for any follow-up Occupational Therapy or equipment needs. OT is signing off. Thank you for this referral.      Follow Up Recommendations  No OT follow up;Supervision - Intermittent    Equipment Recommendations  3 in 1 bedside commode    Recommendations for Other Services       Precautions / Restrictions Precautions Precautions: Back Precaution Booklet Issued: Yes (comment) Precaution Comments: recalling 3/3.  Requires min cues for precautions during ADLs  Required Braces or Orthoses: Spinal Brace Spinal Brace: Thoracolumbosacral orthotic;Applied in sitting position Restrictions Weight Bearing Restrictions: No      Mobility Bed Mobility Overal bed mobility: Needs Assistance Bed Mobility: Rolling;Sidelying to Sit;Sit to Sidelying Rolling: Supervision Sidelying to sit: Supervision     Sit to sidelying: Supervision General bed mobility comments: Pt requires cues for technique and increased time and effort   Transfers Overall transfer level: Modified independent Equipment used: None             General transfer comment: Increased time and effort to achieve upright    Balance Overall balance assessment: Needs assistance Sitting-balance support: Feet supported Sitting balance-Leahy Scale: Good     Standing balance support: No upper extremity  supported;During functional activity Standing balance-Leahy Scale: Fair                             ADL either performed or assessed with clinical judgement   ADL Overall ADL's : Needs assistance/impaired Eating/Feeding: Independent   Grooming: Wash/dry hands;Wash/dry face;Oral care;Supervision/safety;Standing Grooming Details (indicate cue type and reason): reviewed safe technique for oral care  Upper Body Bathing: Supervision/ safety;Set up;Standing   Lower Body Bathing: Supervison/ safety;Sit to/from stand Lower Body Bathing Details (indicate cue type and reason): Pt reports she has a long handled loofa  Upper Body Dressing : Set up;Supervision/safety;Sitting   Lower Body Dressing: Moderate assistance;Sit to/from stand Lower Body Dressing Details (indicate cue type and reason): unable to perform figure 4.  Instructed her how to don pants with reacher and she returned simulated demonstration.  She requires assist to don socks and shoes and states spouse will assist her  Toilet Transfer: Supervision/safety;Ambulation;Comfort height toilet;BSC;RW   Toileting- Clothing Manipulation and Hygiene: Minimal assistance;Sit to/from stand Toileting - Clothing Manipulation Details (indicate cue type and reason): Pt unable to access posterior peri area.  Discussed use and acquisition of toileting aid with her  Tub/ Shower Transfer: Tub transfer;Min guard;Ambulation;Shower Technical sales engineer Details (indicate cue type and reason): reviewed safe technique for transfer and performed simulated transfer          Vision         Perception     Praxis      Pertinent Vitals/Pain Pain Assessment: 0-10 Pain Score: 4  Pain Location: incisions Pain Descriptors / Indicators: Aching;Sore Pain Intervention(s): Monitored during session  Hand Dominance     Extremity/Trunk Assessment Upper Extremity Assessment Upper Extremity Assessment: Overall WFL for tasks  assessed   Lower Extremity Assessment Lower Extremity Assessment: Defer to PT evaluation   Cervical / Trunk Assessment Cervical / Trunk Assessment: Other exceptions Cervical / Trunk Exceptions: post surgical   Communication Communication Communication: No difficulties   Cognition Arousal/Alertness: Awake/alert Behavior During Therapy: WFL for tasks assessed/performed Overall Cognitive Status: Within Functional Limits for tasks assessed                                     General Comments  Pt able to don/doff brace mod I     Exercises     Shoulder Instructions      Home Living Family/patient expects to be discharged to:: Private residence Living Arrangements: Spouse/significant other;Children Available Help at Discharge: Family;Available 24 hours/day Type of Home: House Home Access: Stairs to enter [employer] of Steps: 4   Home Layout: Multi-level Alternate Level Stairs-Number of Steps: 7   Bathroom Shower/Tub: Tub/shower unit;Curtain   Biochemist, clinical: Standard     Home Equipment: Cane - single point;Shower seat;Toilet riser;Adaptive equipment Adaptive Equipment: Reacher        Prior Functioning/Environment Level of Independence: Independent                 OT Problem List: Decreased knowledge of precautions;Decreased knowledge of use of DME or AE;Pain      OT Treatment/Interventions:      OT Goals(Current goals can be found in the care plan section) Acute Rehab OT Goals Patient Stated Goal: to have less pain  OT Goal Formulation: All assessment and education complete, DC therapy  OT Frequency:     Barriers to D/C:            Co-evaluation              AM-PAC OT "6 Clicks" Daily Activity     Outcome Measure Help from another person eating meals?: None Help from another person taking care of personal grooming?: None Help from another person toileting, which includes using toliet, bedpan, or urinal?: None Help  from another person bathing (including washing, rinsing, drying)?: None Help from another person to put on and taking off regular upper body clothing?: None Help from another person to put on and taking off regular lower body clothing?: A Lot 6 Click Score: 22   End of Session Equipment Utilized During Treatment: Rolling walker;Back brace  Activity Tolerance: Patient tolerated treatment well Patient left: in bed;with call bell/phone within reach  OT Visit Diagnosis: Pain Pain - part of body: (back )                Time: 0254-2706 OT Time Calculation (min): 36 min Charges:  OT General Charges $OT Visit: 1 Visit OT Evaluation $OT Eval Moderate Complexity: 1 Mod OT Treatments $Self Care/Home Management : 8-22 mins  Lucille Passy, OTR/L Acute Rehabilitation Services Pager [phone] Office [phone]   Lucille Passy M 04/30/2018, 2:57 PM

## 2018-04-30 NOTE — Progress Notes (Signed)
Physical Therapy Treatment Patient Details Name: [name] MRN: [id number] DOB: 16-Dec-1950 Today's Date: 04/30/2018    History of Present Illness 67 admitted due to LLE pain s/p lateral fusion L3-5 on 4/15 and 2nd stage PLIF L3-5 4/16. PMhx: Rt microdiscectomy L3-4, anxiety, depression, fibromyalgia, HTN, HLD, vertigo    PT Comments    Pt met her physical therapy goals during her inpatient stay. Ambulating in room and hallway distances with walker without physical assistance. Negotiated 10 steps with right railing to simulate home set up with good technique. Pt recalling 3/3 spinal precautions. Required min assist for donning/doffing brace. Pt with no further questions/concerns. No further acute PT needs. Will d/c and sign off.     Follow Up Recommendations  No PT follow up     Equipment Recommendations  Rolling walker with 5" wheels    Recommendations for Other Services       Precautions / Restrictions Precautions Precautions: Back Precaution Booklet Issued: Yes (comment) Precaution Comments: recalling 3/3  Required Braces or Orthoses: Spinal Brace Spinal Brace: Thoracolumbosacral orthotic;Applied in sitting position Restrictions Weight Bearing Restrictions: No    Mobility  Bed Mobility Overal bed mobility: Modified Independent                Transfers Overall transfer level: Modified independent Equipment used: None             General transfer comment: Increased time and effort to achieve upright  Ambulation/Gait Ambulation/Gait assistance: Modified independent (Device/Increase time) Gait Distance (Feet): 200 Feet Assistive device: Rolling walker (2 wheeled) Gait Pattern/deviations: Step-through pattern;Decreased stride length     General Gait Details: cues for rolling the walker rather than picking up with each step   Stairs Stairs: Yes Stairs assistance: Supervision Stair Management: One rail Right Number of Stairs: 10 General stair  comments: step over step with ascending, step by step with descending   Wheelchair Mobility    Modified Rankin (Stroke Patients Only)       Balance                                            Cognition Arousal/Alertness: Awake/alert Behavior During Therapy: WFL for tasks assessed/performed Overall Cognitive Status: Within Functional Limits for tasks assessed                                        Exercises      General Comments        Pertinent Vitals/Pain Pain Assessment: Faces Faces Pain Scale: Hurts a little bit Pain Location: incisions Pain Descriptors / Indicators: Aching;Sore Pain Intervention(s): Monitored during session    Home Living                      Prior Function            PT Goals (current goals can now be found in the care plan section) Acute Rehab PT Goals Patient Stated Goal: return home Potential to Achieve Goals: Good Progress towards PT goals: Goals met/education completed, patient discharged from PT    Frequency    Min 5X/week      PT Plan Other (comment)(d/c therapies)    Co-evaluation              AM-PAC PT "6 Clicks"  Mobility   Outcome Measure  Help needed turning from your back to your side while in a flat bed without using bedrails?: None Help needed moving from lying on your back to sitting on the side of a flat bed without using bedrails?: None Help needed moving to and from a bed to a chair (including a wheelchair)?: None Help needed standing up from a chair using your arms (e.g., wheelchair or bedside chair)?: None Help needed to walk in hospital room?: None Help needed climbing 3-5 steps with a railing? : None 6 Click Score: 24    End of Session Equipment Utilized During Treatment: Gait belt;Back brace Activity Tolerance: Patient tolerated treatment well Patient left: Other (comment)(walking in room) Nurse Communication: Mobility status PT Visit Diagnosis: Other  abnormalities of gait and mobility (R26.89)     Time: 0240-9735 PT Time Calculation (min) (ACUTE ONLY): 18 min  Charges:  $Gait Training: 8-22 mins                    Ellamae Sia, PT, DPT Acute Rehabilitation Services Pager [phone] Office [phone]    Willy Eddy 04/30/2018, 10:08 AM

## 2018-04-30 NOTE — Progress Notes (Signed)
    Patient doing well, has completed PT and is ready to head home, reports moderate LBP and resolved leg pain. She is ecstatic about how well her legs are doing and eager to progress home and walk with her daughter.   Physical Exam: Vitals:   04/30/18 0400 04/30/18 0801  BP: 120/62 120/65  Pulse: 66 65  Resp: 18 18  Temp: 97.8 F (36.6 C) 98.1 F (36.7 C)  SpO2: 99% 99%    Dressing in place, CDI LAT and POST NVI  POD #2 s/p LAT/POST staged fusion with resolved leg pain and expected LBP doing well   - up with PT/OT, encourage ambulation - Percocet for pain, Valium for muscle spasms, sent to pharm electronically  - d/c home today

## 2018-05-11 DIAGNOSIS — Z9889 Other specified postprocedural states: Secondary | ICD-10-CM | POA: Diagnosis not present

## 2018-05-12 DIAGNOSIS — M79605 Pain in left leg: Secondary | ICD-10-CM | POA: Diagnosis not present

## 2018-05-12 DIAGNOSIS — G8929 Other chronic pain: Secondary | ICD-10-CM | POA: Diagnosis not present

## 2018-05-12 DIAGNOSIS — R531 Weakness: Secondary | ICD-10-CM | POA: Diagnosis not present

## 2018-05-12 DIAGNOSIS — M797 Fibromyalgia: Secondary | ICD-10-CM | POA: Diagnosis not present

## 2018-05-12 DIAGNOSIS — Z4789 Encounter for other orthopedic aftercare: Secondary | ICD-10-CM | POA: Diagnosis not present

## 2018-05-12 DIAGNOSIS — M545 Low back pain: Secondary | ICD-10-CM | POA: Diagnosis not present

## 2018-05-12 DIAGNOSIS — M150 Primary generalized (osteo)arthritis: Secondary | ICD-10-CM | POA: Diagnosis not present

## 2018-05-12 DIAGNOSIS — M109 Gout, unspecified: Secondary | ICD-10-CM | POA: Diagnosis not present

## 2018-05-12 DIAGNOSIS — F329 Major depressive disorder, single episode, unspecified: Secondary | ICD-10-CM | POA: Diagnosis not present

## 2018-05-12 DIAGNOSIS — F419 Anxiety disorder, unspecified: Secondary | ICD-10-CM | POA: Diagnosis not present

## 2018-05-12 DIAGNOSIS — M5416 Radiculopathy, lumbar region: Secondary | ICD-10-CM | POA: Diagnosis not present

## 2018-05-12 DIAGNOSIS — I10 Essential (primary) hypertension: Secondary | ICD-10-CM | POA: Diagnosis not present

## 2018-05-12 DIAGNOSIS — R2689 Other abnormalities of gait and mobility: Secondary | ICD-10-CM | POA: Diagnosis not present

## 2018-05-12 DIAGNOSIS — Z967 Presence of other bone and tendon implants: Secondary | ICD-10-CM | POA: Diagnosis not present

## 2018-05-17 DIAGNOSIS — F329 Major depressive disorder, single episode, unspecified: Secondary | ICD-10-CM | POA: Diagnosis not present

## 2018-05-17 DIAGNOSIS — M79605 Pain in left leg: Secondary | ICD-10-CM | POA: Diagnosis not present

## 2018-05-17 DIAGNOSIS — M545 Low back pain: Secondary | ICD-10-CM | POA: Diagnosis not present

## 2018-05-17 DIAGNOSIS — Z4789 Encounter for other orthopedic aftercare: Secondary | ICD-10-CM | POA: Diagnosis not present

## 2018-05-17 DIAGNOSIS — G8929 Other chronic pain: Secondary | ICD-10-CM | POA: Diagnosis not present

## 2018-05-17 DIAGNOSIS — M5416 Radiculopathy, lumbar region: Secondary | ICD-10-CM | POA: Diagnosis not present

## 2018-05-19 DIAGNOSIS — F329 Major depressive disorder, single episode, unspecified: Secondary | ICD-10-CM | POA: Diagnosis not present

## 2018-05-19 DIAGNOSIS — Z4789 Encounter for other orthopedic aftercare: Secondary | ICD-10-CM | POA: Diagnosis not present

## 2018-05-19 DIAGNOSIS — M5416 Radiculopathy, lumbar region: Secondary | ICD-10-CM | POA: Diagnosis not present

## 2018-05-19 DIAGNOSIS — M545 Low back pain: Secondary | ICD-10-CM | POA: Diagnosis not present

## 2018-05-19 DIAGNOSIS — G8929 Other chronic pain: Secondary | ICD-10-CM | POA: Diagnosis not present

## 2018-05-19 DIAGNOSIS — M79605 Pain in left leg: Secondary | ICD-10-CM | POA: Diagnosis not present

## 2018-05-19 NOTE — Discharge Summary (Signed)
Patient ID: [name] MRN: [id number] DOB/AGE: [birth year] 68 y.o.  Admit date: 04/28/2018 Discharge date: 04/30/2018  Admission Diagnoses:  Active Problems:   Radiculopathy   Discharge Diagnoses:  Same  Past Medical History:  Diagnosis Date  . Anxiety   . Arthritis    hands, feet  . Back pain    chronic  . Cataracts, bilateral    MD just watching right now  . Cystitides, interstitial, chronic   . Depression   . Fibromyalgia   . GERD (gastroesophageal reflux disease)   . Gout    feet  . High cholesterol   . Hypertension   . Shortness of breath    with exertion  . Sleep apnea    Uses CPAP nightly  . SVD (spontaneous vaginal delivery)    x 3  . Urinary disorder    bladder is in sling per pt  . Urinary tract infection   . Vertigo   . Wears glasses     Surgeries: Procedure(s): LEFT LUMBER THREE-FIVE POSTERIOR SPINAL FUSION WITH PERCUTANEOUS SCREWS AND ALLOGRAFT on 04/29/2018   Consultants: None  Discharged Condition: Improved  Hospital Course: [name] is an 68 y.o. female who was admitted 04/28/2018 for operative treatment of radiculopathy. Patient has severe unremitting pain that affects sleep, daily activities, and work/hobbies. After pre-op clearance the patient was taken to the operating room on 04/29/2018 and underwent  Procedure(s): LEFT LUMBER THREE-FIVE POSTERIOR SPINAL FUSION WITH PERCUTANEOUS SCREWS AND ALLOGRAFT.    Patient was given perioperative antibiotics:  Anti-infectives (From admission, onward)   Start     Dose/Rate Route Frequency Ordered Stop   04/29/18 0630  ceFAZolin (ANCEF) IVPB 2g/100 mL premix     2 g 200 mL/hr over 30 Minutes Intravenous On call to O.R. 04/29/18 0627 04/29/18 0800   04/28/18 1700  ceFAZolin (ANCEF) IVPB 2g/100 mL premix     2 g 200 mL/hr over 30 Minutes Intravenous Every 8 hours 04/28/18 1406 04/29/18 0037   04/28/18 0715  ceFAZolin (ANCEF) IVPB 2g/100 mL premix     2 g 200 mL/hr over 30 Minutes  Intravenous On call to O.R. 04/28/18 6301 04/28/18 0915       Patient was given sequential compression devices, early ambulation to prevent DVT.  Patient benefited maximally from hospital stay and there were no complications.    Recent vital signs: BP (!) 113/53 (BP Location: Right Arm)   Pulse (!) 59   Temp 97.9 F (36.6 C) (Oral)   Resp 16   Ht 5' 5.5" (1.664 m)   Wt 95.3 kg   SpO2 96%   BMI 34.41 kg/m    Discharge Medications:   Allergies as of 04/30/2018      Reactions   Lyrica [pregabalin]    UNSPECIFIED REACTION       Medication List    TAKE these medications   acetaminophen 500 MG tablet Commonly known as:  TYLENOL Take 500-1,000 mg by mouth every 6 (six) hours as needed (pain.).   ALPRAZolam 0.5 MG tablet Commonly known as:  XANAX Take 0.5 mg by mouth 2 (two) times daily.   amLODipine 10 MG tablet Commonly known as:  NORVASC Take 5 mg by mouth daily.   CAMPHOR-MENTHOL EX Apply 1 application topically 3 (three) times daily as needed (for pain). [place] Gel   Cannabidiol Powd Apply 1 application topically 3 (three) times daily as needed (for pain.). CBD HEMP 300 Pain Relief Cream   diazepam 5 MG tablet Commonly known  as:  VALIUM Take 1 tablet (5 mg total) by mouth every 6 (six) hours as needed for muscle spasms.   DULoxetine 60 MG capsule Commonly known as:  CYMBALTA Take 1 capsule (60 mg total) by mouth 2 (two) times daily. What changed:  when to take this   fluticasone 50 MCG/ACT nasal spray Commonly known as:  FLONASE Place 1 spray into both nostrils daily as needed for allergies.   lisinopril-hydrochlorothiazide 20-12.5 MG tablet Commonly known as:  ZESTORETIC Take 1 tablet by mouth 2 (two) times daily.   Melatonin 10 MG Tabs Take 10 mg by mouth at bedtime.   metoprolol tartrate 100 MG tablet Commonly known as:  LOPRESSOR Take 100 mg by mouth 2 (two) times daily.   MUSCLE & JOINT EX Apply 1 application topically 3 (three) times daily as  needed (pain.). Muscle Joint Relief Rub   ondansetron 4 MG tablet Commonly known as:  ZOFRAN Take 1 tablet (4 mg total) by mouth every 6 (six) hours as needed for nausea or vomiting.   OPCON-A OP Place 1 drop into both eyes 3 (three) times daily as needed (dry/irritated eyes.).   oxyCODONE-acetaminophen 5-325 MG tablet Commonly known as:  PERCOCET/ROXICET Take 1-2 tablets by mouth every 4 (four) hours as needed for moderate pain or severe pain.   pantoprazole 20 MG tablet Commonly known as:  PROTONIX Take 1 tablet (20 mg total) by mouth daily.   PROBIOTIC DAILY PO Take 1 capsule by mouth daily.   simvastatin 20 MG tablet Commonly known as:  ZOCOR Take 10 mg by mouth at bedtime.   VITAMIN B-12 PO Take 1 tablet by mouth daily.   Vitamin D3 1.25 MG (50000 UT) Tabs Take 50,000 Units by mouth every Thursday.       Diagnostic Studies: Dg Lumbar Spine 2-3 Views  Result Date: 04/29/2018 CLINICAL DATA:  Status post surgical posterior fusion. EXAM: LUMBAR SPINE - 2-3 VIEW; DG C-ARM 61-120 MIN FLUOROSCOPY TIME:  2 minutes 58 seconds. COMPARISON:  Fluoroscopic images of April 28, 2018. FINDINGS: Two intraoperative fluoroscopic images demonstrate the patient be status post surgical posterior fusion of L3-4 and L4-5 with bilateral intrapedicular screw placement. Good alignment of vertebral bodies is noted IMPRESSION: Status post surgical posterior fusion of L3-4 and L4-5. Electronically Signed   By: Marijo Conception M.D.   On: 04/29/2018 09:53   Dg Lumbar Spine 2-3 Views  Result Date: 04/28/2018 CLINICAL DATA:  Lumbar surgery. EXAM: LUMBAR SPINE - 2-3 VIEW; DG C-ARM 61-120 MIN COMPARISON:  MRI of March 10, 2018. Radiation exposure index: 54.7 mGy. FINDINGS: Two intraoperative fluoroscopic images demonstrate surgical spacers placed in the L3-4 and L4-5 disc spaces. Good alignment of vertebral bodies is noted. IMPRESSION: Fluoroscopic guidance provided during lumbar surgery. Electronically  Signed   By: Marijo Conception M.D.   On: 04/28/2018 13:39   Dg Chest Port 1 View  Result Date: 04/28/2018 CLINICAL DATA:  Status post central line placement. EXAM: PORTABLE CHEST 1 VIEW COMPARISON:  Radiographs of March 27, 2017. FINDINGS: Stable cardiomediastinal silhouette. No pneumothorax or pleural effusion is noted. Interval placement of right internal jugular catheter with distal tip in expected position of the SVC. Both lungs are clear. The visualized skeletal structures are unremarkable. IMPRESSION: Interval placement of right internal jugular catheter with distal tip in expected position of the SVC. Electronically Signed   By: Marijo Conception M.D.   On: 04/28/2018 13:40   Dg C-arm 1-60 Min  Result Date: 04/29/2018 CLINICAL  DATA:  Status post surgical posterior fusion. EXAM: LUMBAR SPINE - 2-3 VIEW; DG C-ARM 61-120 MIN FLUOROSCOPY TIME:  2 minutes 58 seconds. COMPARISON:  Fluoroscopic images of April 28, 2018. FINDINGS: Two intraoperative fluoroscopic images demonstrate the patient be status post surgical posterior fusion of L3-4 and L4-5 with bilateral intrapedicular screw placement. Good alignment of vertebral bodies is noted IMPRESSION: Status post surgical posterior fusion of L3-4 and L4-5. Electronically Signed   By: Marijo Conception M.D.   On: 04/29/2018 09:53   Dg C-arm 1-60 Min  Result Date: 04/28/2018 CLINICAL DATA:  Lumbar surgery. EXAM: LUMBAR SPINE - 2-3 VIEW; DG C-ARM 61-120 MIN COMPARISON:  MRI of March 10, 2018. Radiation exposure index: 54.7 mGy. FINDINGS: Two intraoperative fluoroscopic images demonstrate surgical spacers placed in the L3-4 and L4-5 disc spaces. Good alignment of vertebral bodies is noted. IMPRESSION: Fluoroscopic guidance provided during lumbar surgery. Electronically Signed   By: Marijo Conception M.D.   On: 04/28/2018 13:39    Disposition: Discharge disposition: 01-Home or Self Care       Discharge Instructions    Discharge patient   Complete by:  As  directed    D/C Instructions printed and placed in paper chart D/C scripts sent to pharmacy Back precautions at all times F/U in office 2 weeks   Discharge disposition:  01-Home or Self Care   Discharge patient date:  04/30/2018     POD #2 s/p LAT/POST staged fusion with resolved leg pain and expected LBP doing well   - up with PT/OT, encourage ambulation - Percocet for pain, Valium for muscle spasms, sent to pharm electronically  -Written scripts for pain sent to pharmacy -D/C instructions sheet printed and in chart -D/C today  -F/U in office 2 weeks   Signed: Lennie Muckle Janellie Tennison 05/19/2018, 2:44 PM

## 2018-05-24 ENCOUNTER — Telehealth: Payer: Self-pay | Admitting: *Deleted

## 2018-05-24 NOTE — Telephone Encounter (Signed)
Called patient to discuss her new pt appt in June. I advised her that due to current COVID 19 pandemic, our office is severely reducing in person visits in order to minimize the risk to our patients and healthcare providers. We recommend to convert your appointment to a video visit, and can move it sooner. She recently had two back surgeries. She stated her back surgeries were emergent, however she is still having pain. She has asked to leave June appt in place. She will let us know if it is still needed after seeing surgeon for FU. She  verbalized understanding, appreciation.

## 2018-05-25 DIAGNOSIS — Z4789 Encounter for other orthopedic aftercare: Secondary | ICD-10-CM | POA: Diagnosis not present

## 2018-05-25 DIAGNOSIS — M79605 Pain in left leg: Secondary | ICD-10-CM | POA: Diagnosis not present

## 2018-05-25 DIAGNOSIS — M545 Low back pain: Secondary | ICD-10-CM | POA: Diagnosis not present

## 2018-05-25 DIAGNOSIS — F329 Major depressive disorder, single episode, unspecified: Secondary | ICD-10-CM | POA: Diagnosis not present

## 2018-05-25 DIAGNOSIS — M5416 Radiculopathy, lumbar region: Secondary | ICD-10-CM | POA: Diagnosis not present

## 2018-05-25 DIAGNOSIS — G8929 Other chronic pain: Secondary | ICD-10-CM | POA: Diagnosis not present

## 2018-05-27 DIAGNOSIS — Z4789 Encounter for other orthopedic aftercare: Secondary | ICD-10-CM | POA: Diagnosis not present

## 2018-05-27 DIAGNOSIS — G8929 Other chronic pain: Secondary | ICD-10-CM | POA: Diagnosis not present

## 2018-05-27 DIAGNOSIS — F329 Major depressive disorder, single episode, unspecified: Secondary | ICD-10-CM | POA: Diagnosis not present

## 2018-05-27 DIAGNOSIS — M79605 Pain in left leg: Secondary | ICD-10-CM | POA: Diagnosis not present

## 2018-05-27 DIAGNOSIS — M5416 Radiculopathy, lumbar region: Secondary | ICD-10-CM | POA: Diagnosis not present

## 2018-05-27 DIAGNOSIS — M545 Low back pain: Secondary | ICD-10-CM | POA: Diagnosis not present

## 2018-06-01 DIAGNOSIS — M797 Fibromyalgia: Secondary | ICD-10-CM | POA: Diagnosis not present

## 2018-06-01 DIAGNOSIS — G629 Polyneuropathy, unspecified: Secondary | ICD-10-CM | POA: Diagnosis not present

## 2018-06-01 DIAGNOSIS — K921 Melena: Secondary | ICD-10-CM | POA: Diagnosis not present

## 2018-06-01 DIAGNOSIS — K5903 Drug induced constipation: Secondary | ICD-10-CM | POA: Diagnosis not present

## 2018-06-01 DIAGNOSIS — K644 Residual hemorrhoidal skin tags: Secondary | ICD-10-CM | POA: Insufficient documentation

## 2018-06-01 DIAGNOSIS — F339 Major depressive disorder, recurrent, unspecified: Secondary | ICD-10-CM | POA: Diagnosis not present

## 2018-06-01 DIAGNOSIS — M5116 Intervertebral disc disorders with radiculopathy, lumbar region: Secondary | ICD-10-CM | POA: Diagnosis not present

## 2018-06-02 DIAGNOSIS — Z4789 Encounter for other orthopedic aftercare: Secondary | ICD-10-CM | POA: Diagnosis not present

## 2018-06-02 DIAGNOSIS — M545 Low back pain: Secondary | ICD-10-CM | POA: Diagnosis not present

## 2018-06-02 DIAGNOSIS — M5416 Radiculopathy, lumbar region: Secondary | ICD-10-CM | POA: Diagnosis not present

## 2018-06-02 DIAGNOSIS — M79605 Pain in left leg: Secondary | ICD-10-CM | POA: Diagnosis not present

## 2018-06-02 DIAGNOSIS — F329 Major depressive disorder, single episode, unspecified: Secondary | ICD-10-CM | POA: Diagnosis not present

## 2018-06-02 DIAGNOSIS — G8929 Other chronic pain: Secondary | ICD-10-CM | POA: Diagnosis not present

## 2018-06-15 ENCOUNTER — Encounter: Payer: Self-pay | Admitting: *Deleted

## 2018-06-15 NOTE — Telephone Encounter (Signed)
Attempted to reach patient on mobile # x 2. Message stated her phone has call restrictions and this call will not go through. Called home # and it immediately rang busy. My chart message sent to advise her that she must reschedule her appt tomorrow or convert to video visit.

## 2018-06-16 ENCOUNTER — Encounter

## 2018-06-16 ENCOUNTER — Institutional Professional Consult (permissible substitution): Payer: Medicare Other | Admitting: Diagnostic Neuroimaging

## 2018-06-16 NOTE — Telephone Encounter (Signed)
Patient did not reply to my chart message or call. Her appointment this morning was canceled. She may reschedulefor video visit or in office in July if she calls back or replies to my chart.

## 2018-06-18 DIAGNOSIS — M5416 Radiculopathy, lumbar region: Secondary | ICD-10-CM | POA: Diagnosis not present

## 2018-07-08 DIAGNOSIS — R300 Dysuria: Secondary | ICD-10-CM | POA: Diagnosis not present

## 2018-07-08 DIAGNOSIS — Z6834 Body mass index (BMI) 34.0-34.9, adult: Secondary | ICD-10-CM | POA: Diagnosis not present

## 2018-07-08 DIAGNOSIS — R1032 Left lower quadrant pain: Secondary | ICD-10-CM | POA: Diagnosis not present

## 2018-07-13 DIAGNOSIS — H353131 Nonexudative age-related macular degeneration, bilateral, early dry stage: Secondary | ICD-10-CM | POA: Diagnosis not present

## 2018-07-13 DIAGNOSIS — H04123 Dry eye syndrome of bilateral lacrimal glands: Secondary | ICD-10-CM | POA: Diagnosis not present

## 2018-07-13 DIAGNOSIS — H2512 Age-related nuclear cataract, left eye: Secondary | ICD-10-CM | POA: Diagnosis not present

## 2018-07-13 DIAGNOSIS — H25811 Combined forms of age-related cataract, right eye: Secondary | ICD-10-CM | POA: Diagnosis not present

## 2018-07-14 ENCOUNTER — Encounter: Payer: Self-pay | Admitting: *Deleted

## 2018-07-19 ENCOUNTER — Other Ambulatory Visit: Payer: Self-pay

## 2018-07-19 ENCOUNTER — Ambulatory Visit (INDEPENDENT_AMBULATORY_CARE_PROVIDER_SITE_OTHER): Payer: Medicare Other | Admitting: Diagnostic Neuroimaging

## 2018-07-19 ENCOUNTER — Encounter: Payer: Self-pay | Admitting: Diagnostic Neuroimaging

## 2018-07-19 VITALS — BP 132/71 | HR 54 | Temp 96.4°F | Ht 65.0 in | Wt 205.0 lb

## 2018-07-19 DIAGNOSIS — M5416 Radiculopathy, lumbar region: Secondary | ICD-10-CM

## 2018-07-19 DIAGNOSIS — M545 Low back pain, unspecified: Secondary | ICD-10-CM

## 2018-07-19 NOTE — Progress Notes (Signed)
[facility]  PATIENT: [name] DOB: Oct 14, 1950  REFERRING CLINICIAN: Dumonski HISTORY FROM: patient  REASON FOR VISIT: new consult    HISTORICAL  CHIEF COMPLAINT:  Chief Complaint  Patient presents with  . New Patient (Initial Visit)    Referral from  Dr. Phylliss Bob for  lumbar pain radiates to left leg room 7 patient alone with  brace on waist pt has had three surgeries in  ths last 6 months    HISTORY OF PRESENT ILLNESS:   68 year old female here for evaluation of low back pain rating to the left leg.  Symptoms started 1 to 2 years ago, patient underwent lumbar spine surgery in October 2019.  However due to persistent symptoms she had another MRI in February 2020 which showed L3-4 and L4-5 spinal stenosis.  She underwent decompression fusion surgery in April 2020.  Patient continues to have low back pain rating to the left leg, hip, groin and genital region.  Symptoms are not improving.  She is on pain medication without relief.  She tried injections without relief.   REVIEW OF SYSTEMS: Full 14 system review of systems performed and negative with exception of: As per HPI.  ALLERGIES: No Active Allergies  HOME MEDICATIONS: Outpatient Medications Prior to Visit  Medication Sig Dispense Refill  . ALPRAZolam (XANAX) 0.5 MG tablet Take 0.5 mg by mouth 2 (two) times daily.   5  . amLODipine (NORVASC) 10 MG tablet Take 5 mg by mouth daily.    Marland Kitchen CAMPHOR-MENTHOL EX Apply 1 application topically 3 (three) times daily as needed (for pain). [place] Gel    . Cyanocobalamin (VITAMIN B-12 PO) Take 1 tablet by mouth daily.    . cyclobenzaprine (FLEXERIL) 5 MG tablet Take 5-10 mg by mouth at bedtime as needed.    . DULoxetine (CYMBALTA) 60 MG capsule Take 1 capsule (60 mg total) by mouth 2 (two) times daily. (Patient taking differently: Take 60 mg by mouth at bedtime. ) 60 capsule 2  . fluticasone (FLONASE) 50 MCG/ACT nasal spray Place 1 spray into both nostrils  daily as needed for allergies.     Marland Kitchen gabapentin (NEURONTIN) 300 MG capsule at bedtime. Take 2 at bedtime    . HYDROcodone-acetaminophen (NORCO) 10-325 MG tablet Take 1 tablet by mouth every 6 (six) hours as needed. for pain    . lisinopril-hydrochlorothiazide (PRINZIDE,ZESTORETIC) 20-12.5 MG per tablet Take 1 tablet by mouth 2 (two) times daily.    . Melatonin 10 MG TABS Take 10 mg by mouth at bedtime.    . Menthol, Topical Analgesic, (MUSCLE & JOINT EX) Apply 1 application topically 3 (three) times daily as needed (pain.). Muscle Joint Relief Rub    . methocarbamol (ROBAXIN) 500 MG tablet TAKE 1 TABLET BY MOUTH EVERY 6 HOURS AS NEEDED FOR DAYTIME FOR SPASMS/MUSCLE FOR TENSION    . metoprolol (LOPRESSOR) 100 MG tablet Take 100 mg by mouth 2 (two) times daily.     . Naphazoline-Pheniramine (OPCON-A OP) Place 1 drop into both eyes 3 (three) times daily as needed (dry/irritated eyes.).    Marland Kitchen pantoprazole (PROTONIX) 20 MG tablet Take 1 tablet (20 mg total) by mouth daily. 30 tablet 0  . Probiotic Product (PROBIOTIC DAILY PO) Take 1 capsule by mouth daily.     . simvastatin (ZOCOR) 20 MG tablet Take 10 mg by mouth at bedtime.     Marland Kitchen acetaminophen (TYLENOL) 500 MG tablet Take 500-1,000 mg by mouth every 6 (six) hours as needed (pain.).     Marland Kitchen  Cannabidiol POWD Apply 1 application topically 3 (three) times daily as needed (for pain.). CBD HEMP 300 Pain Relief Cream    . Cholecalciferol (VITAMIN D3) 1.25 MG (50000 UT) TABS Take 50,000 Units by mouth every Thursday.    . diazepam (VALIUM) 5 MG tablet Take 1 tablet (5 mg total) by mouth every 6 (six) hours as needed for muscle spasms. (Patient not taking: Reported on 07/19/2018) 30 tablet 0  . ondansetron (ZOFRAN) 4 MG tablet Take 1 tablet (4 mg total) by mouth every 6 (six) hours as needed for nausea or vomiting. (Patient not taking: Reported on 07/19/2018) 20 tablet 0  . oxyCODONE-acetaminophen (PERCOCET/ROXICET) 5-325 MG tablet Take 1-2 tablets by mouth every 4  (four) hours as needed for moderate pain or severe pain. (Patient not taking: Reported on 07/19/2018) 30 tablet 0   No facility-administered medications prior to visit.     PAST MEDICAL HISTORY: Past Medical History:  Diagnosis Date  . Anxiety   . Arthritis    hands, feet  . Back pain    chronic  . Cataracts, bilateral    MD just watching right now  . Cystitides, interstitial, chronic   . Depression   . Fibromyalgia   . GERD (gastroesophageal reflux disease)   . Gout    feet  . High cholesterol   . Hypertension   . Shortness of breath    with exertion  . Sleep apnea    Uses CPAP nightly  . SVD (spontaneous vaginal delivery)    x 3  . Urinary disorder    bladder is in sling per pt  . Urinary tract infection   . Vertigo   . Wears glasses     PAST SURGICAL HISTORY: Past Surgical History:  Procedure Laterality Date  . ABDOMINAL HYSTERECTOMY  1997   [place] LAT LUMBAR FUSION Right 04/28/2018   Procedure: ANTERIOR LATERAL INNER BODY LUMBAR FUSION L3-4, L4-5 2 LEVELS WITH INSTRUMENTATION AND ALLOGRAFT;  Surgeon: Phylliss Bob, MD;  Location: [facility];  Service: Orthopedics;  Laterality: Right;  . BACK SURGERY  10/20/2017  . bladder tack    . COLONOSCOPY N/A 08/23/2014   Procedure: COLONOSCOPY;  Surgeon: Rogene Houston, MD;  Location: [facility];  Service: Endoscopy;  Laterality: N/A;  1200  . KNEE SURGERY Left 05/26/14   meniscus tear  . LAPAROSCOPIC SALPINGO OOPHERECTOMY  02/17/2012   Procedure: LAPAROSCOPIC SALPINGO OOPHORECTOMY;  Surgeon: Jonnie Kind, MD;  Location: [facility];  Service: Gynecology;  Laterality: Bilateral;  . LASIK Bilateral   . TOE SURGERY    . UPPER GI ENDOSCOPY      FAMILY HISTORY: Family History  Problem Relation Age of Onset  . Atrial fibrillation Mother   . COPD Mother   . Hypertension Mother   . Hypertension Father   . Cancer Father        throat   . Drug abuse Brother   . Alcohol abuse Brother   . Early death  Brother   . Fibromyalgia Daughter   . Paranoid behavior Son   . Alcohol abuse Son   . Hypertension Brother   . Sleep apnea Brother   . Alcohol abuse Brother   . Drug abuse Brother     SOCIAL HISTORY: Social History   Socioeconomic History  . Marital status: Married    Spouse name: Jeneen Rinks  . Number of children: Not on file  . Years of education: college  . Highest education level:  Not on file  Occupational History    Employer: UNEMPLOYED  Social Needs  . Financial resource strain: Not on file  . Food insecurity    Worry: Not on file    Inability: Not on file  . Transportation needs    Medical: Not on file    Non-medical: Not on file  Tobacco Use  . Smoking status: Never Smoker  . Smokeless tobacco: Never Used  Substance and Sexual Activity  . Alcohol use: Not Currently    Alcohol/week: 0.0 standard drinks    Comment: occassional, but patient states "currently does not drink"  . Drug use: No  . Sexual activity: Yes    Birth control/protection: Surgical, Post-menopausal    Comment: hysterectomy  Lifestyle  . Physical activity    Days per week: Not on file    Minutes per session: Not on file  . Stress: Not on file  Relationships  . Social Herbalist on phone: Not on file    Gets together: Not on file    Attends religious service: Not on file    Active member of club or organization: Not on file    Attends meetings of clubs or organizations: Not on file    Relationship status: Not on file  . Intimate partner violence    Fear of current or ex partner: Not on file    Emotionally abused: Not on file    Physically abused: Not on file    Forced sexual activity: Not on file  Other Topics Concern  . Not on file  Social History Narrative   Patient lives at home with her family.   Caffeine Use: 1 cup daily     PHYSICAL EXAM  GENERAL EXAM/CONSTITUTIONAL: Vitals:  Vitals:   07/19/18 0826  BP: 132/71  Pulse: (!) 54  Temp: (!) 96.4 F (35.8 C)   Weight: 205 lb (93 kg)  Height: 5\' 5"  (1.651 m)     Body mass index is 34.11 kg/m. Wt Readings from Last 3 Encounters:  07/19/18 205 lb (93 kg)  04/28/18 210 lb (95.3 kg)  04/21/18 211 lb 1 oz (95.7 kg)     Patient is in no distress; well developed, nourished and groomed; neck is supple  CARDIOVASCULAR:  Examination of carotid arteries is normal; no carotid bruits  Regular rate and rhythm, no murmurs  Examination of peripheral vascular system by observation and palpation is normal  EYES:  Ophthalmoscopic exam of optic discs and posterior segments is normal; no papilledema or hemorrhages  No exam data present  MUSCULOSKELETAL:  Gait, strength, tone, movements noted in Neurologic exam below  NEUROLOGIC: MENTAL STATUS:  No flowsheet data found.  awake, alert, oriented to person, place and time  recent and remote memory intact  normal attention and concentration  language fluent, comprehension intact, naming intact  fund of knowledge appropriate  CRANIAL NERVE:   2nd - no papilledema on fundoscopic exam  2nd, 3rd, 4th, 6th - pupils equal and reactive to light, visual fields full to confrontation, extraocular muscles intact, no nystagmus  5th - facial sensation symmetric  7th - facial strength symmetric  8th - hearing intact  9th - palate elevates symmetrically, uvula midline  11th - shoulder shrug symmetric  12th - tongue protrusion midline  MOTOR:   normal bulk and tone, full strength in the BUE, BLE  SENSORY:   normal and symmetric to light touch, temperature, vibration; EXCEPT DECR IN LEFT LEG  COORDINATION:   finger-nose-finger, fine  finger movements normal  REFLEXES:   deep tendon reflexes TRACE and symmetric  GAIT/STATION:   narrow based gait     DIAGNOSTIC DATA (LABS, IMAGING, TESTING) - I reviewed patient records, labs, notes, testing and imaging myself where available.  Lab Results  Component Value Date   WBC 7.7  04/21/2018   HGB 12.8 04/21/2018   HCT 39.1 04/21/2018   MCV 90.9 04/21/2018   PLT 282 04/21/2018      Component Value Date/Time   NA 138 04/21/2018 1042   K 3.5 04/21/2018 1042   CL 98 04/21/2018 1042   CO2 27 04/21/2018 1042   GLUCOSE 110 (H) 04/21/2018 1042   BUN 16 04/21/2018 1042   CREATININE 0.88 04/21/2018 1042   CALCIUM 10.1 04/21/2018 1042   PROT 6.7 04/21/2018 1042   ALBUMIN 4.0 04/21/2018 1042   AST 24 04/21/2018 1042   ALT 30 04/21/2018 1042   ALKPHOS 44 04/21/2018 1042   BILITOT 0.4 04/21/2018 1042   GFRNONAA >60 04/21/2018 1042   GFRAA >60 04/21/2018 1042   Lab Results  Component Value Date   CHOL (H) 03/06/2010    214        ATP III CLASSIFICATION:  <200     mg/dL   Desirable  200-239  mg/dL   Borderline High  >=240    mg/dL   High          HDL 44 03/06/2010   LDLCALC (H) 03/06/2010    137        Total Cholesterol/HDL:CHD Risk Coronary Heart Disease Risk Table                     Men   Women  1/2 Average Risk   3.4   3.3  Average Risk       5.0   4.4  2 X Average Risk   9.6   7.1  3 X Average Risk  23.4   11.0        Use the calculated Patient Ratio above and the CHD Risk Table to determine the patient's CHD Risk.        ATP III CLASSIFICATION (LDL):  <100     mg/dL   Optimal  100-129  mg/dL   Near or Above                    Optimal  130-159  mg/dL   Borderline  160-189  mg/dL   High  >190     mg/dL   Very High   TRIG 167 (H) 03/06/2010   CHOLHDL 4.9 03/06/2010   Lab Results  Component Value Date   HGBA1C 5.9 (H) 05/23/2013   Lab Results  Component Value Date   [id number] 865 05/23/2013   Lab Results  Component Value Date   TSH 2.080 05/23/2013    03/10/18 MRI lumbar [I reviewed images myself and agree with interpretation. -VRP]  1. Interval evolution of postsurgical changes at L3-4. Mild residual left lateral recess and moderate left neural foraminal stenosis. 2. Unchanged disc and facet degeneration elsewhere, most notable at  L4-5 where there is moderate spinal and right greater than left neural foraminal stenosis.    ASSESSMENT AND PLAN  68 y.o. year old female here with persistent postoperative low back pain radiating to the left leg.  Dx:  1. Lumbar pain   2. Lumbar radiculopathy     PLAN:  - continue pain meds, PT, exercises - optimize sleep, nutrition  Return for return to PCP.    Penni Bombard, MD 04/14/2818, 8:13 AM Certified in Neurology, Neurophysiology and Neuroimaging  [facility] [address] [place], [place] [address] [phone]

## 2018-07-30 DIAGNOSIS — Z9889 Other specified postprocedural states: Secondary | ICD-10-CM | POA: Diagnosis not present

## 2018-07-30 DIAGNOSIS — M545 Low back pain: Secondary | ICD-10-CM | POA: Diagnosis not present

## 2018-08-02 DIAGNOSIS — Z01818 Encounter for other preprocedural examination: Secondary | ICD-10-CM | POA: Diagnosis not present

## 2018-08-02 DIAGNOSIS — H2512 Age-related nuclear cataract, left eye: Secondary | ICD-10-CM | POA: Diagnosis not present

## 2018-08-02 DIAGNOSIS — H25811 Combined forms of age-related cataract, right eye: Secondary | ICD-10-CM | POA: Diagnosis not present

## 2018-08-13 ENCOUNTER — Other Ambulatory Visit: Payer: Self-pay

## 2018-08-13 DIAGNOSIS — H25811 Combined forms of age-related cataract, right eye: Secondary | ICD-10-CM | POA: Diagnosis not present

## 2018-08-13 DIAGNOSIS — H2511 Age-related nuclear cataract, right eye: Secondary | ICD-10-CM | POA: Diagnosis not present

## 2018-08-16 DIAGNOSIS — M961 Postlaminectomy syndrome, not elsewhere classified: Secondary | ICD-10-CM | POA: Diagnosis not present

## 2018-08-16 DIAGNOSIS — N949 Unspecified condition associated with female genital organs and menstrual cycle: Secondary | ICD-10-CM | POA: Diagnosis not present

## 2018-08-16 DIAGNOSIS — Z79899 Other long term (current) drug therapy: Secondary | ICD-10-CM | POA: Diagnosis not present

## 2018-08-16 DIAGNOSIS — G894 Chronic pain syndrome: Secondary | ICD-10-CM | POA: Diagnosis not present

## 2018-08-16 DIAGNOSIS — M79605 Pain in left leg: Secondary | ICD-10-CM | POA: Diagnosis not present

## 2018-08-16 DIAGNOSIS — Z79891 Long term (current) use of opiate analgesic: Secondary | ICD-10-CM | POA: Diagnosis not present

## 2018-08-17 ENCOUNTER — Other Ambulatory Visit: Payer: Self-pay | Admitting: Pain Medicine

## 2018-08-17 DIAGNOSIS — R200 Anesthesia of skin: Secondary | ICD-10-CM

## 2018-08-23 ENCOUNTER — Ambulatory Visit
Admission: RE | Admit: 2018-08-23 | Discharge: 2018-08-23 | Disposition: A | Discharge status: Home / Self Care | Payer: Medicare Other | Source: Ambulatory Visit | Attending: Pain Medicine | Admitting: Pain Medicine

## 2018-08-23 ENCOUNTER — Other Ambulatory Visit: Payer: Self-pay

## 2018-08-23 DIAGNOSIS — M545 Low back pain: Secondary | ICD-10-CM | POA: Diagnosis not present

## 2018-08-23 DIAGNOSIS — R200 Anesthesia of skin: Secondary | ICD-10-CM

## 2018-08-23 MED ORDER — GADOBENATE DIMEGLUMINE 529 MG/ML IV SOLN
18.0000 mL | Freq: Once | INTRAVENOUS | Status: AC | PRN
  Administered 2018-08-23: 18 mL via INTRAVENOUS

## 2018-08-24 DIAGNOSIS — M5417 Radiculopathy, lumbosacral region: Secondary | ICD-10-CM | POA: Diagnosis not present

## 2018-08-26 DIAGNOSIS — M545 Low back pain: Secondary | ICD-10-CM | POA: Diagnosis not present

## 2018-08-27 DIAGNOSIS — H25812 Combined forms of age-related cataract, left eye: Secondary | ICD-10-CM | POA: Diagnosis not present

## 2018-08-27 DIAGNOSIS — H2512 Age-related nuclear cataract, left eye: Secondary | ICD-10-CM | POA: Diagnosis not present

## 2018-09-14 ENCOUNTER — Other Ambulatory Visit: Payer: Medicare Other

## 2018-09-16 DIAGNOSIS — M5417 Radiculopathy, lumbosacral region: Secondary | ICD-10-CM | POA: Diagnosis not present

## 2018-09-16 DIAGNOSIS — Z6832 Body mass index (BMI) 32.0-32.9, adult: Secondary | ICD-10-CM | POA: Diagnosis not present

## 2018-09-16 DIAGNOSIS — G4733 Obstructive sleep apnea (adult) (pediatric): Secondary | ICD-10-CM | POA: Diagnosis not present

## 2018-09-21 DIAGNOSIS — G894 Chronic pain syndrome: Secondary | ICD-10-CM | POA: Diagnosis not present

## 2018-09-21 DIAGNOSIS — M961 Postlaminectomy syndrome, not elsewhere classified: Secondary | ICD-10-CM | POA: Diagnosis not present

## 2018-09-21 DIAGNOSIS — M79605 Pain in left leg: Secondary | ICD-10-CM | POA: Diagnosis not present

## 2018-09-21 DIAGNOSIS — R200 Anesthesia of skin: Secondary | ICD-10-CM | POA: Diagnosis not present

## 2018-11-30 DIAGNOSIS — Z6832 Body mass index (BMI) 32.0-32.9, adult: Secondary | ICD-10-CM | POA: Diagnosis not present

## 2018-11-30 DIAGNOSIS — R300 Dysuria: Secondary | ICD-10-CM | POA: Diagnosis not present

## 2018-11-30 DIAGNOSIS — H8110 Benign paroxysmal vertigo, unspecified ear: Secondary | ICD-10-CM | POA: Diagnosis not present

## 2019-01-05 ENCOUNTER — Other Ambulatory Visit: Payer: Self-pay

## 2019-01-05 ENCOUNTER — Ambulatory Visit (INDEPENDENT_AMBULATORY_CARE_PROVIDER_SITE_OTHER): Payer: Medicare Other | Admitting: Urology

## 2019-01-05 ENCOUNTER — Encounter: Payer: Self-pay | Admitting: Urology

## 2019-01-05 VITALS — BP 180/83 | HR 62 | Temp 96.6°F | Ht 65.0 in | Wt 205.0 lb

## 2019-01-05 DIAGNOSIS — N3021 Other chronic cystitis with hematuria: Secondary | ICD-10-CM | POA: Diagnosis not present

## 2019-01-05 DIAGNOSIS — R3989 Other symptoms and signs involving the genitourinary system: Secondary | ICD-10-CM | POA: Diagnosis not present

## 2019-01-05 DIAGNOSIS — N3010 Interstitial cystitis (chronic) without hematuria: Secondary | ICD-10-CM | POA: Insufficient documentation

## 2019-01-05 LAB — POCT URINALYSIS DIPSTICK
Bilirubin, UA: NEGATIVE
Blood, UA: NEGATIVE
Glucose, UA: NEGATIVE
Ketones, UA: NEGATIVE
Leukocytes, UA: NEGATIVE
Nitrite, UA: NEGATIVE
Protein, UA: NEGATIVE
Spec Grav, UA: 1.01 (ref 1.010–1.025)
Urobilinogen, UA: NEGATIVE E.U./dL — AB
pH, UA: 6.5 (ref 5.0–8.0)

## 2019-01-05 MED ORDER — FLUCONAZOLE 150 MG PO TABS: 150.0000 mg | ORAL_TABLET | Freq: Every day | ORAL | 0 refills | Status: DC

## 2019-01-05 NOTE — Patient Instructions (Signed)

## 2019-01-05 NOTE — Progress Notes (Signed)
01/05/2019 11:04 AM   [name] [birth year] [id number]  Referring provider: Curlene Labrum, MD [place],  [place] [address]  Suprapubic pain  HPI: [name] is a 68yo with a hx of SUI s/p MUS in 2012 here with with continued UTI symptoms after being treated for a UTI in 11/2018. She was treated with macrobid for 5 days for a positive urine culture. Since being treated she is having urgency, frequency and dysuria. She was diagnosed with IC 4-5 years. She has mild SUI and uses 1 pad per day. No hematuria on todays UA. UA today normal. No other associated symptoms. No exacerbating/alleviaitng events.    PMH: Past Medical History:  Diagnosis Date  . Anxiety   . Arthritis    hands, feet  . Back pain    chronic  . Cataracts, bilateral    MD just watching right now  . Cystitides, interstitial, chronic   . Depression   . Fibromyalgia   . GERD (gastroesophageal reflux disease)   . Gout    feet  . High cholesterol   . Hypertension   . Shortness of breath    with exertion  . Sleep apnea    Uses CPAP nightly  . SVD (spontaneous vaginal delivery)    x 3  . Urinary disorder    bladder is in sling per pt  . Urinary tract infection   . Vertigo   . Wears glasses     Surgical History: Past Surgical History:  Procedure Laterality Date  . ABDOMINAL HYSTERECTOMY  1997   [facility] [place] LAT LUMBAR FUSION Right 04/28/2018   Procedure: ANTERIOR LATERAL INNER BODY LUMBAR FUSION L3-4, L4-5 2 LEVELS WITH INSTRUMENTATION AND ALLOGRAFT;  Surgeon: Phylliss Bob, MD;  Location: [facility];  Service: Orthopedics;  Laterality: Right;  . BACK SURGERY  10/20/2017  . bladder tack    . COLONOSCOPY N/A 08/23/2014   Procedure: COLONOSCOPY;  Surgeon: Rogene Houston, MD;  [facility];  Service: Endoscopy;  Laterality: N/A;  1200  . KNEE SURGERY Left 05/26/14   meniscus tear  . LAPAROSCOPIC SALPINGO OOPHERECTOMY  02/17/2012   Procedure: LAPAROSCOPIC SALPINGO  OOPHORECTOMY;  Surgeon: Jonnie Kind, MD;  [facility]: [facility];  Service: Gynecology;  Laterality: Bilateral;  . LASIK Bilateral   . TOE SURGERY    . UPPER GI ENDOSCOPY      Home Medications:  Allergies as of 01/05/2019   No Known Allergies     Medication List       Accurate as of January 05, 2019 11:04 AM. If you have any questions, ask your nurse or doctor.        ALPRAZolam 0.5 MG tablet Commonly known as: XANAX Take 0.5 mg by mouth 2 (two) times daily.   amLODipine 10 MG tablet Commonly known as: NORVASC Take 5 mg by mouth daily.   CAMPHOR-MENTHOL EX Apply 1 application topically 3 (three) times daily as needed (for pain). [place] Gel   cyclobenzaprine 5 MG tablet Commonly known as: FLEXERIL Take 5-10 mg by mouth at bedtime as needed.   DULoxetine 60 MG capsule Commonly known as: CYMBALTA Take 1 capsule (60 mg total) by mouth 2 (two) times daily. What changed: when to take this   fluticasone 50 MCG/ACT nasal spray Commonly known as: FLONASE Place 1 spray into both nostrils daily as needed for allergies.   gabapentin 300 MG capsule Commonly known as: NEURONTIN at bedtime. Take 2 at  bedtime   HYDROcodone-acetaminophen 10-325 MG tablet Commonly known as: NORCO Take 1 tablet by mouth every 6 (six) hours as needed. for pain   lisinopril-hydrochlorothiazide 20-12.5 MG tablet Commonly known as: ZESTORETIC Take 1 tablet by mouth 2 (two) times daily.   Melatonin 10 MG Tabs Take 10 mg by mouth at bedtime.   methocarbamol 500 MG tablet Commonly known as: ROBAXIN TAKE 1 TABLET BY MOUTH EVERY 6 HOURS AS NEEDED FOR DAYTIME FOR SPASMS/MUSCLE FOR TENSION   metoprolol tartrate 100 MG tablet Commonly known as: LOPRESSOR Take 100 mg by mouth 2 (two) times daily.   MUSCLE & JOINT EX Apply 1 application topically 3 (three) times daily as needed (pain.). Muscle Joint Relief Rub   OPCON-A OP Place 1 drop into both eyes 3 (three) times daily as needed (dry/irritated  eyes.).   pantoprazole 20 MG tablet Commonly known as: PROTONIX Take 1 tablet (20 mg total) by mouth daily.   PROBIOTIC DAILY PO Take 1 capsule by mouth daily.   simvastatin 20 MG tablet Commonly known as: ZOCOR Take 10 mg by mouth at bedtime.   VITAMIN B-12 PO Take 1 tablet by mouth daily.       Allergies: No Known Allergies  Family History: Family History  Problem Relation Age of Onset  . Atrial fibrillation Mother   . COPD Mother   . Hypertension Mother   . Hypertension Father   . Cancer Father        throat   . Drug abuse Brother   . Alcohol abuse Brother   . Early death Brother   . Fibromyalgia Daughter   . Paranoid behavior Son   . Alcohol abuse Son   . Hypertension Brother   . Sleep apnea Brother   . Alcohol abuse Brother   . Drug abuse Brother     Social History:  reports that she has never smoked. She has never used smokeless tobacco. She reports previous alcohol use. She reports that she does not use drugs.  ROS:   Urological Symptom Review  Patient is experiencing the following symptoms: Frequent urination   Review of Systems  Gastrointestinal (upper)  : Negative for upper GI symptoms  Gastrointestinal (lower) : Negative for lower GI symptoms  Constitutional : Negative for symptoms  Skin: Negative for skin symptoms  Eyes: Negative for eye symptoms  Ear/Nose/Throat : Negative for Ear/Nose/Throat symptoms  Hematologic/Lymphatic: Negative for Hematologic/Lymphatic symptoms  Cardiovascular : Negative for cardiovascular symptoms  Respiratory : Negative for respiratory symptoms  Endocrine: Negative for endocrine symptoms  Musculoskeletal: Negative for musculoskeletal symptoms  Neurological: Negative for neurological symptoms  Psychologic: Negative for psychiatric symptoms                                       Physical Exam: BP (!) 180/83   Pulse 62   Temp (!) 96.6 F (35.9 C)   Ht 5\' 5"   (1.651 m)   Wt 205 lb (93 kg)   LMP  (LMP Unknown)   BMI 34.11 kg/m   Constitutional:  Alert and oriented, No acute distress. HEENT: [place] AT, moist mucus membranes.  Trachea midline, no masses. Cardiovascular: No clubbing, cyanosis, or edema. Respiratory: Normal respiratory effort, no increased work of breathing. GI: Abdomen is soft, nontender, nondistended, no abdominal masses GU: No CVA tenderness. NO vaginal atrophy, no irritation. urethral normal. No cystocele, rectocele Lymph: No cervical or inguinal lymphadenopathy. Skin:  No rashes, bruises or suspicious lesions. Neurologic: Grossly intact, no focal deficits, moving all 4 extremities. Psychiatric: Normal mood and affect.  Laboratory Data: Lab Results  Component Value Date   WBC 7.7 04/21/2018   HGB 12.8 04/21/2018   HCT 39.1 04/21/2018   MCV 90.9 04/21/2018   PLT 282 04/21/2018    Lab Results  Component Value Date   CREATININE 0.88 04/21/2018    No results found for: PSA  No results found for: TESTOSTERONE  Lab Results  Component Value Date   HGBA1C 5.9 (H) 05/23/2013    Urinalysis    Component Value Date/Time   COLORURINE YELLOW 04/21/2018 1044   APPEARANCEUR CLEAR 04/21/2018 1044   LABSPEC 1.023 04/21/2018 1044   PHURINE 5.0 04/21/2018 1044   GLUCOSEU NEGATIVE 04/21/2018 1044   HGBUR NEGATIVE 04/21/2018 1044   BILIRUBINUR neg 01/05/2019 1049   KETONESUR NEGATIVE 04/21/2018 1044   PROTEINUR Negative 01/05/2019 1049   PROTEINUR NEGATIVE 04/21/2018 1044   UROBILINOGEN negative (A) 01/05/2019 1049   UROBILINOGEN 0.2 02/12/2012 1510   NITRITE neg 01/05/2019 1049   NITRITE NEGATIVE 04/21/2018 1044   LEUKOCYTESUR Negative 01/05/2019 1049   LEUKOCYTESUR MODERATE (A) 04/21/2018 1044    Lab Results  Component Value Date   BACTERIA NONE SEEN 04/21/2018    Pertinent Imaging:  No results found for this or any previous visit. No results found for this or any previous visit. No results found for this or  any previous visit. No results found for this or any previous visit. No results found for this or any previous visit. No results found for this or any previous visit. No results found for this or any previous visit. No results found for this or any previous visit.  Assessment & Plan:    1. Bladder pain -Mirabegron 25mg  - POCT urinalysis dipstick  2. OAB: -mirabegron 25mg  daily -RTC 4-6 weeks  No follow-ups on file.  Nicolette Bang, MD  [facility] [place]

## 2019-01-17 ENCOUNTER — Telehealth: Payer: Self-pay | Admitting: Urology

## 2019-01-17 NOTE — Telephone Encounter (Signed)
Pt reports myrbetriq has not helped with the "bladder pain" pt reports she has done "DMSO bladder treatments at the urology center and my doctor has increased my Cymbalta and that has helped some" Can we start her on DMSO treatments?

## 2019-01-17 NOTE — Telephone Encounter (Signed)
Pt left vm and state the medication Dr. Alyson Ingles gave her is not working and would like to know if she can take something over the counter.

## 2019-01-19 NOTE — Telephone Encounter (Signed)
Yes. She needs the DMSO treatment

## 2019-01-20 NOTE — Telephone Encounter (Signed)
Pt called and wants to know when she will get the treatments she needs? She requested a nurse call her back.

## 2019-01-21 ENCOUNTER — Other Ambulatory Visit: Payer: Self-pay

## 2019-01-21 NOTE — Telephone Encounter (Signed)
Pt scheduled for DMSO treatment. Email sent to physicians service on how to order compound medication.

## 2019-01-21 NOTE — Telephone Encounter (Signed)
DMSO was ordered through [facility] to ship via urochart

## 2019-01-26 ENCOUNTER — Telehealth: Payer: Self-pay

## 2019-01-26 ENCOUNTER — Ambulatory Visit (INDEPENDENT_AMBULATORY_CARE_PROVIDER_SITE_OTHER): Payer: Medicare Other

## 2019-01-26 ENCOUNTER — Ambulatory Visit: Payer: Medicare Other

## 2019-01-26 ENCOUNTER — Other Ambulatory Visit: Payer: Self-pay

## 2019-01-26 DIAGNOSIS — N3021 Other chronic cystitis with hematuria: Secondary | ICD-10-CM | POA: Diagnosis not present

## 2019-01-26 LAB — POCT URINALYSIS DIPSTICK
Bilirubin, UA: NEGATIVE
Blood, UA: NEGATIVE
Glucose, UA: NEGATIVE
Ketones, UA: NEGATIVE
Nitrite, UA: NEGATIVE
Protein, UA: NEGATIVE
Spec Grav, UA: 1.02 (ref 1.010–1.025)
Urobilinogen, UA: NEGATIVE E.U./dL — AB
pH, UA: 6 (ref 5.0–8.0)

## 2019-01-26 MED ORDER — DIMETHYL SULFOXIDE 50 % IS SOLN
50.0000 mL | Freq: Once | INTRAVESICAL | Status: AC
  Administered 2019-01-26: 50 mL via URETHRAL

## 2019-01-26 MED ORDER — HYDROCORTISONE SOD SUCCINATE 100 MG PF FOR IT USE
100.0000 mg | Freq: Once | INTRAMUSCULAR | Status: AC
  Administered 2019-01-26: 100 mg via INTRATHECAL

## 2019-01-26 MED ORDER — SODIUM BICARBONATE 8.4 % IV SOLN
50.0000 meq | Freq: Once | INTRAVENOUS | Status: AC
  Administered 2019-01-26: 50 meq

## 2019-01-26 NOTE — Telephone Encounter (Signed)
Patient called office after receiving DMSO treatment today. Complains of burning sensation. Spoke with Dr. Alyson Ingles who said after [facility] treatment not uncommon and pt can take AZO over counter for relief. Patient instructed and voiced understanding.

## 2019-01-26 NOTE — Progress Notes (Signed)
Bladder Instillation  Due to IC patient is present today for a Bladder Instillation of DMSO treatment. Patient was cleaned and prepped in a sterile fashion with betadine,  A 14FR catheter was inserted, urine return was noted 85ml, urine was yellow in color.  DMSO  was instilled into the bladder. The catheter was then removed. Patient tolerated well, no complications were noted Patient was instructed to hold treatment in bladder for 20 minutes before voiding. Pt voiced understanding.   Preformed by: Gibson Ramp, RN  Follow up/ Additional notes: f/u in 1 week for another treatment.

## 2019-02-02 ENCOUNTER — Other Ambulatory Visit: Payer: Self-pay

## 2019-02-02 ENCOUNTER — Ambulatory Visit (INDEPENDENT_AMBULATORY_CARE_PROVIDER_SITE_OTHER): Payer: Medicare Other

## 2019-02-02 DIAGNOSIS — R3989 Other symptoms and signs involving the genitourinary system: Secondary | ICD-10-CM

## 2019-02-02 DIAGNOSIS — N3021 Other chronic cystitis with hematuria: Secondary | ICD-10-CM

## 2019-02-02 LAB — POCT URINALYSIS DIPSTICK
Bilirubin, UA: NEGATIVE
Blood, UA: NEGATIVE
Glucose, UA: NEGATIVE
Ketones, UA: NEGATIVE
Nitrite, UA: NEGATIVE
Protein, UA: NEGATIVE
Spec Grav, UA: 1.015 (ref 1.010–1.025)
Urobilinogen, UA: NEGATIVE E.U./dL — AB
pH, UA: 6 (ref 5.0–8.0)

## 2019-02-02 MED ORDER — SODIUM BICARBONATE 8.4 % IV SOLN
50.0000 meq | Freq: Once | INTRAVENOUS | Status: AC
  Administered 2019-02-02: 50 meq

## 2019-02-02 MED ORDER — DIMETHYL SULFOXIDE 50 % IS SOLN
50.0000 mL | Freq: Once | INTRAVESICAL | Status: AC
  Administered 2019-02-02: 50 mL via URETHRAL

## 2019-02-02 MED ORDER — FLUCONAZOLE 150 MG PO TABS: 150.0000 mg | ORAL_TABLET | Freq: Every day | ORAL | 0 refills | Status: DC

## 2019-02-02 MED ORDER — HYDROCORTISONE SOD SUCCINATE 100 MG PF FOR IT USE
100.0000 mg | Freq: Once | INTRAMUSCULAR | Status: AC
  Administered 2019-02-02: 100 mg via INTRATHECAL

## 2019-02-02 NOTE — Progress Notes (Signed)
Bladder Rescue Solution Instillation  Due to IC patient is present today for a Rescue Solution Treatment.  Patient was cleaned and prepped in a sterile fashion with betadine   A 14 FR catheter was inserted, urine return was noted 26ml, urine was yellow in color.  Instilled a solution consisting of 3ml of Sodium Bicarb, 50 ml of RIMSO, 100mg  solucortef The catheter was then removed. Patient tolerated well, no complications were noted.   Performed by: Gibson Ramp. RN  Follow up/ Additional Notes: return 1 week for nurse visit for treatment.

## 2019-02-09 ENCOUNTER — Ambulatory Visit (INDEPENDENT_AMBULATORY_CARE_PROVIDER_SITE_OTHER): Payer: Medicare Other

## 2019-02-09 ENCOUNTER — Other Ambulatory Visit: Payer: Self-pay

## 2019-02-09 ENCOUNTER — Ambulatory Visit: Payer: Medicare Other

## 2019-02-09 VITALS — Temp 97.0°F

## 2019-02-09 DIAGNOSIS — N3021 Other chronic cystitis with hematuria: Secondary | ICD-10-CM

## 2019-02-09 MED ORDER — HYDROCORTISONE SOD SUCCINATE 100 MG PF FOR IT USE
100.0000 mg | Freq: Once | INTRAMUSCULAR | Status: AC
  Administered 2019-02-09: 100 mg via INTRATHECAL

## 2019-02-09 MED ORDER — DIMETHYL SULFOXIDE 50 % IS SOLN
50.0000 mL | Freq: Once | INTRAVESICAL | Status: AC
  Administered 2019-02-09: 12:00:00 50 mL via URETHRAL

## 2019-02-09 MED ORDER — SODIUM BICARBONATE 8.4 % IV SOLN
50.0000 meq | Freq: Once | INTRAVENOUS | Status: AC
  Administered 2019-02-09: 50 meq

## 2019-02-09 MED ORDER — LIDOCAINE HCL 1 % IJ SOLN
20.0000 mL | Freq: Once | INTRAMUSCULAR | Status: AC
  Administered 2019-02-09: 20 mL

## 2019-02-09 NOTE — Progress Notes (Signed)
Bladder Instillation  Due to IC patient is present today for a Bladder Instillation of DMSO tx. Patient was cleaned and prepped in a sterile fashion with betadine.  A 16FR catheter was inserted, urine return was noted 61ml, urine was yellow in color.  Cocktail  was instilled into the bladder. The catheter was then removed. Patient tolerated well, no complications were noted Patient held in bladder for 30 minutes prior to procedure starting.   Preformed by: Armed forces operational officer, Jaylene Arrowood LPN  Follow up/ Additional notes: 1 week

## 2019-02-16 ENCOUNTER — Ambulatory Visit (INDEPENDENT_AMBULATORY_CARE_PROVIDER_SITE_OTHER): Payer: Medicare Other

## 2019-02-16 ENCOUNTER — Other Ambulatory Visit: Payer: Self-pay

## 2019-02-16 ENCOUNTER — Telehealth: Payer: Self-pay

## 2019-02-16 DIAGNOSIS — N3021 Other chronic cystitis with hematuria: Secondary | ICD-10-CM

## 2019-02-16 LAB — POCT URINALYSIS DIPSTICK
Bilirubin, UA: NEGATIVE
Blood, UA: NEGATIVE
Glucose, UA: NEGATIVE
Ketones, UA: NEGATIVE
Nitrite, UA: NEGATIVE
Protein, UA: NEGATIVE
Spec Grav, UA: 1.015 (ref 1.010–1.025)
Urobilinogen, UA: NEGATIVE E.U./dL — AB
pH, UA: 6 (ref 5.0–8.0)

## 2019-02-16 MED ORDER — LIDOCAINE HCL 2 % IJ SOLN
10.0000 mL | Freq: Once | INTRAMUSCULAR | Status: AC
  Administered 2019-02-16: 200 mg

## 2019-02-16 MED ORDER — HYDROCORTISONE NA SUCCINATE PF 100 MG IJ SOLR
100.0000 mg | Freq: Once | INTRAMUSCULAR | Status: AC
  Administered 2019-02-16: 14:00:00 100 mg

## 2019-02-16 MED ORDER — DIMETHYL SULFOXIDE 50 % IS SOLN
50.0000 mL | Freq: Once | INTRAVESICAL | Status: AC
  Administered 2019-02-16: 50 mL via URETHRAL

## 2019-02-16 MED ORDER — SODIUM BICARBONATE 8.4 % IV SOLN
50.0000 meq | Freq: Once | INTRAVENOUS | Status: AC
  Administered 2019-02-16: 50 meq via INTRAVENOUS

## 2019-02-16 NOTE — Telephone Encounter (Signed)
Pt called office today after receiving her DMSO treatment. Complains of pressure and burning. Spoke with Dr. Alyson Ingles who recommends taking AZO OTC and limiting fluid intake for several hours. Informed pt and voiced understanding,.

## 2019-02-16 NOTE — Progress Notes (Signed)
Bladder Instillation  Due to IC patient is present today for a Bladder Instillation of dmso tx. Patient was cleaned and prepped in a sterile fashion with betadine and lidocaine 2% jelly was instilled into the urethra.  A 14FR catheter was inserted, urine return was noted 42ml, urine was yellow in color.  DMSO was instilled into the bladder. The catheter was then removed. Patient tolerated well, no complications were noted    Preformed by: H. Chandrea Zellman LPN  Follow up/ Additional notes: F/U in 1 month for DMSO tx

## 2019-02-23 ENCOUNTER — Ambulatory Visit: Payer: Medicare Other | Admitting: Urology

## 2019-02-23 ENCOUNTER — Encounter: Payer: Self-pay | Admitting: Urology

## 2019-02-23 ENCOUNTER — Ambulatory Visit (INDEPENDENT_AMBULATORY_CARE_PROVIDER_SITE_OTHER): Payer: Medicare Other | Admitting: Urology

## 2019-02-23 ENCOUNTER — Other Ambulatory Visit: Payer: Self-pay

## 2019-02-23 VITALS — BP 158/79 | HR 62 | Temp 96.4°F | Ht 65.0 in | Wt 200.0 lb

## 2019-02-23 DIAGNOSIS — N3010 Interstitial cystitis (chronic) without hematuria: Secondary | ICD-10-CM | POA: Diagnosis not present

## 2019-02-23 DIAGNOSIS — N3021 Other chronic cystitis with hematuria: Secondary | ICD-10-CM | POA: Diagnosis not present

## 2019-02-23 LAB — POCT URINALYSIS DIPSTICK
Bilirubin, UA: NEGATIVE
Blood, UA: NEGATIVE
Glucose, UA: NEGATIVE
Ketones, UA: NEGATIVE
Nitrite, UA: NEGATIVE
Protein, UA: NEGATIVE
Spec Grav, UA: 1.015
Urobilinogen, UA: 0.2 U/dL
pH, UA: 7

## 2019-02-23 NOTE — Progress Notes (Signed)
02/23/2019 9:27 AM   [name] Jun 12, 1950 [id number]  Referring provider: Curlene Labrum, MD [place],  [place] [address]  Suprapubic pain  HPI: [name] is a 69yo here for followup for IC and chronic cystitis. She is getting weekly-biweekly bladder installations with varying degree of success. She has intermittent suprapubic pain She has severe LUTS. No recent dysuria  Previous note: Kristi Tayte is a 69yo with a hx of SUI s/p MUS in 2012 here with with continued UTI symptoms after being treated for a UTI in 11/2018. She was treated with macrobid for 5 days for a positive urine culture. Since being treated she is having urgency, frequency and dysuria. She was diagnosed with IC 4-5 years. She has mild SUI and uses 1 pad per day. No hematuria on todays UA. UA today normal. No other associated symptoms. No exacerbating/alleviaitng events   PMH: Past Medical History:  Diagnosis Date  . Anxiety   . Arthritis    hands, feet  . Back pain    chronic  . Cataracts, bilateral    MD just watching right now  . Cystitides, interstitial, chronic   . Depression   . Fibromyalgia   . GERD (gastroesophageal reflux disease)   . Gout    feet  . High cholesterol   . Hypertension   . Shortness of breath    with exertion  . Sleep apnea    Uses CPAP nightly  . SVD (spontaneous vaginal delivery)    x 3  . Urinary disorder    bladder is in sling per pt  . Urinary tract infection   . Vertigo   . Wears glasses     Surgical History: Past Surgical History:  Procedure Laterality Date  . ABDOMINAL HYSTERECTOMY  1997   [facility] , [place] LAT LUMBAR FUSION Right 04/28/2018   Procedure: ANTERIOR LATERAL INNER BODY LUMBAR FUSION L3-4, L4-5 2 LEVELS WITH INSTRUMENTATION AND ALLOGRAFT;  Surgeon: Phylliss Bob, MD;  Location: [facility];  Service: Orthopedics;  Laterality: Right;  . BACK SURGERY  10/20/2017  . bladder tack    . COLONOSCOPY N/A 08/23/2014   Procedure:  COLONOSCOPY;  Surgeon: Rogene Houston, MD;  [facility];  Service: Endoscopy;  Laterality: N/A;  1200  . KNEE SURGERY Left 05/26/14   meniscus tear  . LAPAROSCOPIC SALPINGO OOPHERECTOMY  02/17/2012   Procedure: LAPAROSCOPIC SALPINGO OOPHORECTOMY;  Surgeon: Jonnie Kind, MD;  [facility]: [facility];  Service: Gynecology;  Laterality: Bilateral;  . LASIK Bilateral   . TOE SURGERY    . UPPER GI ENDOSCOPY      Home Medications:  Allergies as of 02/23/2019   No Known Allergies     Medication List       Accurate as of February 23, 2019  9:27 AM. If you have any questions, ask your nurse or doctor.        ALPRAZolam 0.5 MG tablet Commonly known as: XANAX Take 0.5 mg by mouth 2 (two) times daily.   amLODipine 10 MG tablet Commonly known as: NORVASC Take 5 mg by mouth daily.   CAMPHOR-MENTHOL EX Apply 1 application topically 3 (three) times daily as needed (for pain). [place] Gel   cyclobenzaprine 5 MG tablet Commonly known as: FLEXERIL Take 5-10 mg by mouth at bedtime as needed.   DULoxetine 60 MG capsule Commonly known as: CYMBALTA Take 1 capsule (60 mg total) by mouth 2 (two) times daily. What changed: when to  take this   fluconazole 150 MG tablet Commonly known as: DIFLUCAN Take 1 tablet (150 mg total) by mouth daily.   fluticasone 50 MCG/ACT nasal spray Commonly known as: FLONASE Place 1 spray into both nostrils daily as needed for allergies.   gabapentin 300 MG capsule Commonly known as: NEURONTIN at bedtime. Take 2 at bedtime   HYDROcodone-acetaminophen 10-325 MG tablet Commonly known as: NORCO Take 1 tablet by mouth every 6 (six) hours as needed. for pain   lisinopril-hydrochlorothiazide 20-12.5 MG tablet Commonly known as: ZESTORETIC Take 1 tablet by mouth 2 (two) times daily.   Melatonin 10 MG Tabs Take 10 mg by mouth at bedtime.   methocarbamol 500 MG tablet Commonly known as: ROBAXIN TAKE 1 TABLET BY MOUTH EVERY 6 HOURS AS NEEDED FOR DAYTIME  FOR SPASMS/MUSCLE FOR TENSION   metoprolol tartrate 100 MG tablet Commonly known as: LOPRESSOR Take 100 mg by mouth 2 (two) times daily.   MUSCLE & JOINT EX Apply 1 application topically 3 (three) times daily as needed (pain.). Muscle Joint Relief Rub   OPCON-A OP Place 1 drop into both eyes 3 (three) times daily as needed (dry/irritated eyes.).   pantoprazole 20 MG tablet Commonly known as: PROTONIX Take 1 tablet (20 mg total) by mouth daily.   PROBIOTIC DAILY PO Take 1 capsule by mouth daily.   simvastatin 20 MG tablet Commonly known as: ZOCOR Take 10 mg by mouth at bedtime.   VITAMIN B-12 PO Take 1 tablet by mouth daily.       Allergies: No Known Allergies  Family History: Family History  Problem Relation Age of Onset  . Atrial fibrillation Mother   . COPD Mother   . Hypertension Mother   . Hypertension Father   . Cancer Father        throat   . Drug abuse Brother   . Alcohol abuse Brother   . Early death Brother   . Fibromyalgia Daughter   . Paranoid behavior Son   . Alcohol abuse Son   . Hypertension Brother   . Sleep apnea Brother   . Alcohol abuse Brother   . Drug abuse Brother     Social History:  reports that she has never smoked. She has never used smokeless tobacco. She reports previous alcohol use. She reports that she does not use drugs.  ROS: All other review of systems were reviewed and are negative except what is noted above in HPI  Physical Exam: BP (!) 158/79   Pulse 62   Temp (!) 96.4 F (35.8 C)   Ht 5\' 5"  (1.651 m)   Wt 200 lb (90.7 kg)   LMP  (LMP Unknown)   BMI 33.28 kg/m   Constitutional:  Alert and oriented, No acute distress. HEENT: [place] AT, moist mucus membranes.  Trachea midline, no masses. Cardiovascular: No clubbing, cyanosis, or edema. Respiratory: Normal respiratory effort, no increased work of breathing. GI: Abdomen is soft, nontender, nondistended, no abdominal masses GU: No CVA tenderness Lymph: No cervical or  inguinal lymphadenopathy. Skin: No rashes, bruises or suspicious lesions. Neurologic: Grossly intact, no focal deficits, moving all 4 extremities. Psychiatric: Normal mood and affect.  Laboratory Data: Lab Results  Component Value Date   WBC 7.7 04/21/2018   HGB 12.8 04/21/2018   HCT 39.1 04/21/2018   MCV 90.9 04/21/2018   PLT 282 04/21/2018    Lab Results  Component Value Date   CREATININE 0.88 04/21/2018    No results found for: PSA  No  results found for: TESTOSTERONE  Lab Results  Component Value Date   HGBA1C 5.9 (H) 05/23/2013    Urinalysis    Component Value Date/Time   COLORURINE YELLOW 04/21/2018 1044   APPEARANCEUR CLEAR 04/21/2018 1044   LABSPEC 1.023 04/21/2018 1044   PHURINE 5.0 04/21/2018 1044   GLUCOSEU NEGATIVE 04/21/2018 1044   HGBUR NEGATIVE 04/21/2018 1044   BILIRUBINUR neg 02/23/2019 0912   KETONESUR NEGATIVE 04/21/2018 1044   PROTEINUR Negative 02/23/2019 0912   PROTEINUR NEGATIVE 04/21/2018 1044   UROBILINOGEN 0.2 02/23/2019 0912   UROBILINOGEN 0.2 02/12/2012 1510   NITRITE neg 02/23/2019 0912   NITRITE NEGATIVE 04/21/2018 1044   LEUKOCYTESUR Moderate (2+) (A) 02/23/2019 0912   LEUKOCYTESUR MODERATE (A) 04/21/2018 1044    Lab Results  Component Value Date   BACTERIA NONE SEEN 04/21/2018    Pertinent Imaging:  No results found for this or any previous visit. No results found for this or any previous visit. No results found for this or any previous visit. No results found for this or any previous visit. No results found for this or any previous visit. No results found for this or any previous visit. No results found for this or any previous visit. No results found for this or any previous visit.  Assessment & Plan:    1. Chronic cystitis with hematuria -observation - POCT urinalysis dipstick  2. IC: -We discussed medical therapy versus hydrodistention and the patient elects for hydrodistention   No follow-ups on  file.  Nicolette Bang, MD  [facility] [place]

## 2019-02-23 NOTE — Progress Notes (Signed)
Urological Symptom Review  Patient is experiencing the following symptoms: Hard to postpone urination Get up at night to urinate Leakage of urine Stream starts and stops Have to strain to urinate   Review of Systems  Gastrointestinal (upper)  : Nausea Indigestion/heartburn  Gastrointestinal (lower) : Constipation  Constitutional : Night Sweats Fatigue  Skin: Negative for skin symptoms  Eyes: Negative for eye symptoms  Ear/Nose/Throat : Sinus problems  Hematologic/Lymphatic: Easy bruising  Cardiovascular : Negative for cardiovascular symptoms  Respiratory : Cough Shortness of breath  Endocrine: Excessive thirst  Musculoskeletal: Joint pain  Neurological: Dizziness  Psychologic: Depression Anxiety

## 2019-02-23 NOTE — Patient Instructions (Signed)
Interstitial Cystitis  Interstitial cystitis is inflammation of the bladder. This may cause pain in the bladder area as well as a frequent and urgent need to urinate. The bladder is a hollow organ in the lower part of the abdomen. It stores urine after the urine is made in the kidneys. The severity of interstitial cystitis can vary from person to person. You may have flare-ups, and then your symptoms may go away for a while. For many people, it becomes a long-term (chronic) problem. What are the causes? The cause of this condition is not known. What increases the risk? The following factors may make you more likely to develop this condition:  You are female.  You have fibromyalgia.  You have irritable bowel syndrome (IBS).  You have endometriosis. This condition may be aggravated by:  Stress.  Smoking.  Spicy foods. What are the signs or symptoms? Symptoms of interstitial cystitis vary, and they can change over time. Symptoms may include:  Discomfort or pain in the bladder area, which is in the lower abdomen. Pain can range from mild to severe. The pain may change in intensity as the bladder fills with urine or as it empties.  Pain in the pelvic area, between the hip bones.  An urgent need to urinate.  Frequent urination.  Pain during urination.  Pain during sex.  Blood in the urine. For women, symptoms often get worse during menstruation. How is this diagnosed? This condition is diagnosed based on your symptoms, your medical history, and a physical exam. You may have tests to rule out other conditions, such as:  Urine tests.  Cystoscopy. For this test, a tool similar to a very thin telescope is used to look into your bladder.  Biopsy. This involves taking a sample of tissue from the bladder to be examined under a microscope. How is this treated? There is no cure for this condition, but treatment can help you control your symptoms. Work closely with your health care  provider to find the most effective treatments for you. Treatment options may include:  Medicines to relieve pain and reduce how often you feel the need to urinate.  Learning ways to control when you urinate (bladder training).  Lifestyle changes, such as changing your diet or taking steps to control stress.  Using a device that provides electrical stimulation to your nerves, which can relieve pain (neuromodulation therapy). The device is placed on your back, where it blocks the nerves that cause you to feel pain in your bladder area.  A procedure that stretches your bladder by filling it with air or fluid.  Surgery. This is rare. It is only done for extreme cases, if other treatments do not help. Follow these instructions at home: Bladder training   Use bladder training techniques as directed. Techniques may include: ? Urinating at scheduled times. ? Training yourself to delay urination. ? Doing exercises (Kegel exercises) to strengthen the muscles that control urine flow.  Keep a bladder diary. ? Write down the times that you urinate and any symptoms that you have. This can help you find out which foods, liquids, or activities make your symptoms worse. ? Use your bladder diary to schedule bathroom trips. If you are away from home, plan to be near a bathroom at each of your scheduled times.  Make sure that you urinate just before you leave the house and just before you go to bed. Eating and drinking  Make dietary changes as recommended by your health care provider. You   may need to avoid: ? Spicy foods. ? Foods that contain a lot of potassium.  Limit your intake of beverages that make you need to urinate. These include: ? Caffeinated beverages like soda, coffee, and tea. ? Alcohol. General instructions  Take over-the-counter and prescription medicines only as told by your health care provider.  Do not drink alcohol.  You can try a warm or cool compress over your bladder for  comfort.  Avoid wearing tight clothing.  Do not use any products that contain nicotine or tobacco, such as cigarettes and e-cigarettes. If you need help quitting, ask your health care provider.  Keep all follow-up visits as told by your health care provider. This is important. Contact a health care provider if you have:  Symptoms that do not get better with treatment.  Pain or discomfort that gets worse.  More frequent urges to urinate.  A fever. Get help right away if:  You have no control over when you urinate. Summary  Interstitial cystitis is inflammation of the bladder.  This condition may cause pain in the bladder area as well as a frequent and urgent need to urinate.  You may have flare-ups of the condition, and then it may go away for a while. For many people, it becomes a long-term (chronic) problem.  There is no cure for interstitial cystitis, but treatment methods are available to control your symptoms. This information is not intended to replace advice given to you by your health care provider. Make sure you discuss any questions you have with your health care provider. Document Revised: 12/12/2016 Document Reviewed: 11/24/2016 Elsevier Patient Education  2020 [employer]  

## 2019-02-28 ENCOUNTER — Telehealth: Payer: Self-pay | Admitting: Urology

## 2019-02-28 NOTE — Telephone Encounter (Signed)
Patient called and is asking to see if her procedure to have bladder stretched has been scheduled? I didn't see anything on my end and I told her I would check with you.

## 2019-03-01 NOTE — Telephone Encounter (Signed)
Pt called back. Pt knows of surgery time from hospital scheduler

## 2019-03-02 ENCOUNTER — Telehealth: Payer: Self-pay

## 2019-03-02 ENCOUNTER — Other Ambulatory Visit: Payer: Self-pay

## 2019-03-02 NOTE — Telephone Encounter (Signed)
Called pt. Initially scheduled for surgery for March 15th. Dr. Alyson Ingles had a conflict with OR schedule. Pt rescheduled to April 5th. Pre- op appt given per Maudie Mercury at [facility] for April 1st at 8:30 am and covid test is at 8am. Pt voiced understanding.

## 2019-03-09 ENCOUNTER — Other Ambulatory Visit: Payer: Self-pay

## 2019-03-09 DIAGNOSIS — N3010 Interstitial cystitis (chronic) without hematuria: Secondary | ICD-10-CM

## 2019-03-11 ENCOUNTER — Telehealth: Payer: Self-pay | Admitting: Urology

## 2019-03-11 NOTE — Telephone Encounter (Signed)
Returned pt call. Pt asked if a sooner time for OR was available. At this time no open time to move surgery up. Pt voiced understanding.

## 2019-03-11 NOTE — Telephone Encounter (Signed)
Patient requests a nurse return her call regarding her bladder stretching appointment

## 2019-03-16 ENCOUNTER — Ambulatory Visit: Payer: Medicare Other

## 2019-03-18 DIAGNOSIS — J019 Acute sinusitis, unspecified: Secondary | ICD-10-CM | POA: Insufficient documentation

## 2019-03-25 ENCOUNTER — Other Ambulatory Visit (HOSPITAL_COMMUNITY): Payer: Medicare Other

## 2019-03-26 DIAGNOSIS — J0101 Acute recurrent maxillary sinusitis: Secondary | ICD-10-CM | POA: Insufficient documentation

## 2019-04-13 ENCOUNTER — Telehealth: Payer: Self-pay | Admitting: Urology

## 2019-04-13 ENCOUNTER — Ambulatory Visit: Payer: Medicare Other | Admitting: Urology

## 2019-04-13 NOTE — Telephone Encounter (Signed)
Pt requests a nurse return her call regarding a issue she is having.

## 2019-04-13 NOTE — Telephone Encounter (Signed)
Pt called back pt has pre op appt tomorrow. Had questions about upcoming surgery. Informed pt pre-op would handle all questions on surgery time and arrival. Pt voiced understanding.

## 2019-04-14 ENCOUNTER — Other Ambulatory Visit: Payer: Self-pay

## 2019-04-14 ENCOUNTER — Encounter (HOSPITAL_COMMUNITY): Payer: Self-pay

## 2019-04-14 ENCOUNTER — Encounter (HOSPITAL_COMMUNITY)
Admission: RE | Admit: 2019-04-14 | Discharge: 2019-04-14 | Disposition: A | Discharge status: Home / Self Care | Payer: Medicare Other | Source: Ambulatory Visit | Attending: Urology | Admitting: Urology

## 2019-04-14 ENCOUNTER — Other Ambulatory Visit (HOSPITAL_COMMUNITY)
Admission: RE | Admit: 2019-04-14 | Discharge: 2019-04-14 | Disposition: A | Discharge status: Home / Self Care | Payer: Medicare Other | Source: Ambulatory Visit | Attending: Urology | Admitting: Urology

## 2019-04-14 DIAGNOSIS — Z20822 Contact with and (suspected) exposure to covid-19: Secondary | ICD-10-CM | POA: Insufficient documentation

## 2019-04-14 DIAGNOSIS — Z01812 Encounter for preprocedural laboratory examination: Secondary | ICD-10-CM | POA: Diagnosis not present

## 2019-04-14 NOTE — Patient Instructions (Signed)
[name]  04/14/2019     @PREFPERIOPPHARMACY @   Your procedure is scheduled on Monday, April 5.  Report to [facility] at 6:50 A.M.  Call this number if you have problems the morning of surgery:  [phone]   Remember:  Do not eat or drink after midnight.     Take these medicines the morning of surgery with A SIP OF WATER xanax, cymbalta, metoprolol, protonix    Do not wear jewelry, make-up or nail polish.  Do not wear lotions, powders, or perfumes, or deodorant.  Do not shave 48 hours prior to surgery.  Men may shave face and neck.  Do not bring valuables to the hospital.  [facility] is not responsible for any belongings or valuables.  Contacts, dentures or bridgework may not be worn into surgery.  Leave your suitcase in the car.  After surgery it may be brought to your room.  For patients admitted to the hospital, discharge time will be determined by your treatment team.  Patients discharged the day of surgery will not be allowed to drive home.   Name and phone number of your driver:   family Special instructions:  none  Please read over the following fact sheets that you were given. Anesthesia Post-op Instructions and Care and Recovery After Surgery       General Anesthesia, Adult, Care After This sheet gives you information about how to care for yourself after your procedure. Your health care provider may also give you more specific instructions. If you have problems or questions, contact your health care provider. What can I expect after the procedure? After the procedure, the following side effects are common:  Pain or discomfort at the IV site.  Nausea.  Vomiting.  Sore throat.  Trouble concentrating.  Feeling cold or chills.  Weak or tired.  Sleepiness and fatigue.  Soreness and body aches. These side effects can affect parts of the body that were not involved in surgery. Follow these instructions at home:  For at least 24 hours after  the procedure:  Have a responsible adult stay with you. It is important to have someone help care for you until you are awake and alert.  Rest as needed.  Do not: ? Participate in activities in which you could fall or become injured. ? Drive. ? Use heavy machinery. ? Drink alcohol. ? Take sleeping pills or medicines that cause drowsiness. ? Make important decisions or sign legal documents. ? Take care of children on your own. Eating and drinking  Follow any instructions from your health care provider about eating or drinking restrictions.  When you feel hungry, start by eating small amounts of foods that are soft and easy to digest (bland), such as toast. Gradually return to your regular diet.  Drink enough fluid to keep your urine pale yellow.  If you vomit, rehydrate by drinking water, juice, or clear broth. General instructions  If you have sleep apnea, surgery and certain medicines can increase your risk for breathing problems. Follow instructions from your health care provider about wearing your sleep device: ? Anytime you are sleeping, including during daytime naps. ? While taking prescription pain medicines, sleeping medicines, or medicines that make you drowsy.  Return to your normal activities as told by your health care provider. Ask your health care provider what activities are safe for you.  Take over-the-counter and prescription medicines only as told by your health care provider.  If you smoke, do not smoke without supervision.  Keep all follow-up visits as told by your health care provider. This is important. Contact a health care provider if:  You have nausea or vomiting that does not get better with medicine.  You cannot eat or drink without vomiting.  You have pain that does not get better with medicine.  You are unable to pass urine.  You develop a skin rash.  You have a fever.  You have redness around your IV site that gets worse. Get help right  away if:  You have difficulty breathing.  You have chest pain.  You have blood in your urine or stool, or you vomit blood. Summary  After the procedure, it is common to have a sore throat or nausea. It is also common to feel tired.  Have a responsible adult stay with you for the first 24 hours after general anesthesia. It is important to have someone help care for you until you are awake and alert.  When you feel hungry, start by eating small amounts of foods that are soft and easy to digest (bland), such as toast. Gradually return to your regular diet.  Drink enough fluid to keep your urine pale yellow.  Return to your normal activities as told by your health care provider. Ask your health care provider what activities are safe for you. This information is not intended to replace advice given to you by your health care provider. Make sure you discuss any questions you have with your health care provider. Document Revised: 01/02/2017 Document Reviewed: 08/15/2016 Elsevier Patient Education  [place]. PATIENT INSTRUCTIONS POST-ANESTHESIA  IMMEDIATELY FOLLOWING SURGERY:  Do not drive or operate machinery for the first twenty four hours after surgery.  Do not make any important decisions for twenty four hours after surgery or while taking narcotic pain medications or sedatives.  If you develop intractable nausea and vomiting or a severe headache please notify your doctor immediately.  FOLLOW-UP:  Please make an appointment with your surgeon as instructed. You do not need to follow up with anesthesia unless specifically instructed to do so.  WOUND CARE INSTRUCTIONS (if applicable):  Keep a dry clean dressing on the anesthesia/puncture wound site if there is drainage.  Once the wound has quit draining you may leave it open to air.  Generally you should leave the bandage intact for twenty four hours unless there is drainage.  If the epidural site drains for more than 36-48 hours  please call the anesthesia department.  QUESTIONS?:  Please feel free to call your physician or the hospital operator if you have any questions, and they will be happy to assist you.      Cystoscopy Cystoscopy is a procedure that is used to help diagnose and sometimes treat conditions that affect the lower urinary tract. The lower urinary tract includes the bladder and the urethra. The urethra is the tube that drains urine from the bladder. Cystoscopy is done using a thin, tube-shaped instrument with a light and camera at the end (cystoscope). The cystoscope may be hard or flexible, depending on the goal of the procedure. The cystoscope is inserted through the urethra, into the bladder. Cystoscopy may be recommended if you have:  Urinary tract infections that keep coming back.  Blood in the urine (hematuria).  An inability to control when you urinate (urinary incontinence) or an overactive bladder.  Unusual cells found in a urine sample.  A blockage in the urethra, such as a urinary stone.  Painful urination.  An abnormality in  the bladder found during an intravenous pyelogram (IVP) or CT scan. Cystoscopy may also be done to remove a sample of tissue to be examined under a microscope (biopsy). Tell a health care provider about:  Any allergies you have.  All medicines you are taking, including vitamins, herbs, eye drops, creams, and over-the-counter medicines.  Any problems you or family members have had with anesthetic medicines.  Any blood disorders you have.  Any surgeries you have had.  Any medical conditions you have.  Whether you are pregnant or may be pregnant. What are the risks? Generally, this is a safe procedure. However, problems may occur, including:  Infection.  Bleeding.  Allergic reactions to medicines.  Damage to other structures or organs. What happens before the procedure?  Ask your health care provider about: ? Changing or stopping your regular  medicines. This is especially important if you are taking diabetes medicines or blood thinners. ? Taking medicines such as aspirin and ibuprofen. These medicines can thin your blood. Do not take these medicines unless your health care provider tells you to take them. ? Taking over-the-counter medicines, vitamins, herbs, and supplements.  Follow instructions from your health care provider about eating or drinking restrictions.  Ask your health care provider what steps will be taken to help prevent infection. These may include: ? Washing skin with a germ-killing soap. ? Taking antibiotic medicine.  You may have an exam or testing, such as: ? X-rays of the bladder, urethra, or kidneys. ? Urine tests to check for signs of infection.  Plan to have someone take you home from the hospital or clinic. What happens during the procedure?   You will be given one or more of the following: ? A medicine to help you relax (sedative). ? A medicine to numb the area (local anesthetic).  The area around the opening of your urethra will be cleaned.  The cystoscope will be passed through your urethra into your bladder.  Germ-free (sterile) fluid will flow through the cystoscope to fill your bladder. The fluid will stretch your bladder so that your health care provider can clearly examine your bladder walls.  Your doctor will look at the urethra and bladder. Your doctor may take a biopsy or remove stones.  The cystoscope will be removed, and your bladder will be emptied. The procedure may vary among health care providers and hospitals. What can I expect after the procedure? After the procedure, it is common to have:  Some soreness or pain in your abdomen and urethra.  Urinary symptoms. These include: ? Mild pain or burning when you urinate. Pain should stop within a few minutes after you urinate. This may last for up to 1 week. ? A small amount of blood in your urine for several days. ? Feeling like  you need to urinate but producing only a small amount of urine. Follow these instructions at home: Medicines  Take over-the-counter and prescription medicines only as told by your health care provider.  If you were prescribed an antibiotic medicine, take it as told by your health care provider. Do not stop taking the antibiotic even if you start to feel better. General instructions  Return to your normal activities as told by your health care provider. Ask your health care provider what activities are safe for you.  Do not drive for 24 hours if you were given a sedative during your procedure.  Watch for any blood in your urine. If the amount of blood in your urine increases,  call your health care provider.  Follow instructions from your health care provider about eating or drinking restrictions.  If a tissue sample was removed for testing (biopsy) during your procedure, it is up to you to get your test results. Ask your health care provider, or the department that is doing the test, when your results will be ready.  Drink enough fluid to keep your urine pale yellow.  Keep all follow-up visits as told by your health care provider. This is important. Contact a health care provider if you:  Have pain that gets worse or does not get better with medicine, especially pain when you urinate.  Have trouble urinating.  Have more blood in your urine. Get help right away if you:  Have blood clots in your urine.  Have abdominal pain.  Have a fever or chills.  Are unable to urinate. Summary  Cystoscopy is a procedure that is used to help diagnose and sometimes treat conditions that affect the lower urinary tract.  Cystoscopy is done using a thin, tube-shaped instrument with a light and camera at the end.  After the procedure, it is common to have some soreness or pain in your abdomen and urethra.  Watch for any blood in your urine. If the amount of blood in your urine increases, call  your health care provider.  If you were prescribed an antibiotic medicine, take it as told by your health care provider. Do not stop taking the antibiotic even if you start to feel better. This information is not intended to replace advice given to you by your health care provider. Make sure you discuss any questions you have with your health care provider. Document Revised: 12/22/2017 Document Reviewed: 12/22/2017 Elsevier Patient Education  [place].

## 2019-04-15 LAB — SARS CORONAVIRUS 2 (TAT 6-24 HRS): SARS Coronavirus 2: NEGATIVE

## 2019-04-18 ENCOUNTER — Ambulatory Visit (HOSPITAL_COMMUNITY): Payer: Medicare Other | Admitting: Anesthesiology

## 2019-04-18 ENCOUNTER — Encounter (HOSPITAL_COMMUNITY)
Admission: RE | Disposition: A | Discharge status: Home / Self Care | Payer: Self-pay | Source: Home / Self Care | Attending: Urology

## 2019-04-18 ENCOUNTER — Ambulatory Visit (HOSPITAL_COMMUNITY)
Admission: RE | Admit: 2019-04-18 | Discharge: 2019-04-18 | Disposition: A | Discharge status: Home / Self Care | Payer: Medicare Other | Attending: Urology | Admitting: Urology

## 2019-04-18 DIAGNOSIS — K219 Gastro-esophageal reflux disease without esophagitis: Secondary | ICD-10-CM | POA: Insufficient documentation

## 2019-04-18 DIAGNOSIS — G4730 Sleep apnea, unspecified: Secondary | ICD-10-CM | POA: Insufficient documentation

## 2019-04-18 DIAGNOSIS — Z90710 Acquired absence of both cervix and uterus: Secondary | ICD-10-CM | POA: Diagnosis not present

## 2019-04-18 DIAGNOSIS — M1990 Unspecified osteoarthritis, unspecified site: Secondary | ICD-10-CM | POA: Insufficient documentation

## 2019-04-18 DIAGNOSIS — H269 Unspecified cataract: Secondary | ICD-10-CM | POA: Diagnosis not present

## 2019-04-18 DIAGNOSIS — E7800 Pure hypercholesterolemia, unspecified: Secondary | ICD-10-CM | POA: Insufficient documentation

## 2019-04-18 DIAGNOSIS — M109 Gout, unspecified: Secondary | ICD-10-CM | POA: Insufficient documentation

## 2019-04-18 DIAGNOSIS — Z90722 Acquired absence of ovaries, bilateral: Secondary | ICD-10-CM | POA: Insufficient documentation

## 2019-04-18 DIAGNOSIS — Z8249 Family history of ischemic heart disease and other diseases of the circulatory system: Secondary | ICD-10-CM | POA: Insufficient documentation

## 2019-04-18 DIAGNOSIS — N3010 Interstitial cystitis (chronic) without hematuria: Secondary | ICD-10-CM | POA: Diagnosis not present

## 2019-04-18 DIAGNOSIS — E669 Obesity, unspecified: Secondary | ICD-10-CM | POA: Diagnosis not present

## 2019-04-18 DIAGNOSIS — I10 Essential (primary) hypertension: Secondary | ICD-10-CM | POA: Diagnosis not present

## 2019-04-18 DIAGNOSIS — M797 Fibromyalgia: Secondary | ICD-10-CM | POA: Insufficient documentation

## 2019-04-18 DIAGNOSIS — M791 Myalgia, unspecified site: Secondary | ICD-10-CM | POA: Diagnosis not present

## 2019-04-18 HISTORY — PX: CYSTO WITH HYDRODISTENSION: SHX5453

## 2019-04-18 LAB — CBC
HCT: 40.6 % (ref 36.0–46.0)
Hemoglobin: 13.2 g/dL (ref 12.0–15.0)
MCH: 30.4 pg (ref 26.0–34.0)
MCHC: 32.5 g/dL (ref 30.0–36.0)
MCV: 93.5 fL (ref 80.0–100.0)
Platelets: 251 10*3/uL (ref 150–400)
RBC: 4.34 MIL/uL (ref 3.87–5.11)
RDW: 13 % (ref 11.5–15.5)
WBC: 6.5 10*3/uL (ref 4.0–10.5)
nRBC: 0 % (ref 0.0–0.2)

## 2019-04-18 LAB — BASIC METABOLIC PANEL
Anion gap: 7 (ref 5–15)
BUN: 17 mg/dL (ref 8–23)
CO2: 28 mmol/L (ref 22–32)
Calcium: 8.8 mg/dL — ABNORMAL LOW (ref 8.9–10.3)
Chloride: 101 mmol/L (ref 98–111)
Creatinine, Ser: 0.68 mg/dL (ref 0.44–1.00)
GFR calc Af Amer: >60 mL/min (ref >60)
GFR calc non Af Amer: >60 mL/min (ref >60)
Glucose, Bld: 105 mg/dL — ABNORMAL HIGH (ref 70–99)
Potassium: 3.9 mmol/L (ref 3.5–5.1)
Sodium: 136 mmol/L (ref 135–145)

## 2019-04-18 SURGERY — CYSTOSCOPY, WITH BLADDER HYDRODISTENSION
Anesthesia: General

## 2019-04-18 MED ORDER — BUPIVACAINE HCL (PF) 0.25 % IJ SOLN
10.0000 mL | Freq: Once | INTRAMUSCULAR | Status: DC
  Filled 2019-04-18: qty 30

## 2019-04-18 MED ORDER — HYDROCODONE-ACETAMINOPHEN 5-325 MG PO TABS: 1.0000 | ORAL_TABLET | ORAL | 0 refills | Status: DC | PRN

## 2019-04-18 MED ORDER — METOCLOPRAMIDE HCL 5 MG/ML IJ SOLN
INTRAMUSCULAR | Status: DC | PRN
  Administered 2019-04-18: 10 mg via INTRAVENOUS

## 2019-04-18 MED ORDER — MIDAZOLAM HCL 5 MG/5ML IJ SOLN
INTRAMUSCULAR | Status: DC | PRN
  Administered 2019-04-18: 2 mg via INTRAVENOUS

## 2019-04-18 MED ORDER — PROPOFOL 10 MG/ML IV BOLUS
INTRAVENOUS | Status: AC
  Filled 2019-04-18: qty 40

## 2019-04-18 MED ORDER — ROCURONIUM BROMIDE 10 MG/ML (PF) SYRINGE
PREFILLED_SYRINGE | INTRAVENOUS | Status: AC
  Filled 2019-04-18: qty 10

## 2019-04-18 MED ORDER — DEXAMETHASONE SODIUM PHOSPHATE 10 MG/ML IJ SOLN
INTRAMUSCULAR | Status: DC | PRN
  Administered 2019-04-18: 10 mg via INTRAVENOUS

## 2019-04-18 MED ORDER — HYDROMORPHONE HCL 1 MG/ML IJ SOLN: 0.2500 mg | INTRAMUSCULAR | Status: DC | PRN

## 2019-04-18 MED ORDER — LACTATED RINGERS IV SOLN: Freq: Once | INTRAVENOUS | Status: AC

## 2019-04-18 MED ORDER — ONDANSETRON HCL 4 MG/2ML IJ SOLN
INTRAMUSCULAR | Status: AC
  Filled 2019-04-18: qty 2

## 2019-04-18 MED ORDER — MIDAZOLAM HCL 2 MG/2ML IJ SOLN
INTRAMUSCULAR | Status: AC
  Filled 2019-04-18: qty 2

## 2019-04-18 MED ORDER — MEPERIDINE HCL 50 MG/ML IJ SOLN: 6.2500 mg | INTRAMUSCULAR | Status: DC | PRN

## 2019-04-18 MED ORDER — SUCCINYLCHOLINE CHLORIDE 20 MG/ML IJ SOLN
INTRAMUSCULAR | Status: DC | PRN
  Administered 2019-04-18: 160 mg via INTRAVENOUS

## 2019-04-18 MED ORDER — DEXAMETHASONE SODIUM PHOSPHATE 10 MG/ML IJ SOLN
INTRAMUSCULAR | Status: AC
  Filled 2019-04-18: qty 1

## 2019-04-18 MED ORDER — METOCLOPRAMIDE HCL 5 MG/ML IJ SOLN
INTRAMUSCULAR | Status: AC
  Filled 2019-04-18: qty 2

## 2019-04-18 MED ORDER — SUCCINYLCHOLINE CHLORIDE 200 MG/10ML IV SOSY
PREFILLED_SYRINGE | INTRAVENOUS | Status: AC
  Filled 2019-04-18: qty 20

## 2019-04-18 MED ORDER — ONDANSETRON HCL 4 MG/2ML IJ SOLN: 4.0000 mg | Freq: Once | INTRAMUSCULAR | Status: DC | PRN

## 2019-04-18 MED ORDER — CEFAZOLIN SODIUM-DEXTROSE 2-4 GM/100ML-% IV SOLN
2.0000 g | INTRAVENOUS | Status: AC
  Administered 2019-04-18: 08:00:00 2 g via INTRAVENOUS
  Filled 2019-04-18: qty 100

## 2019-04-18 MED ORDER — FENTANYL CITRATE (PF) 100 MCG/2ML IJ SOLN
INTRAMUSCULAR | Status: AC
  Filled 2019-04-18: qty 2

## 2019-04-18 MED ORDER — PROPOFOL 10 MG/ML IV BOLUS
INTRAVENOUS | Status: DC | PRN
  Administered 2019-04-18: 200 mg via INTRAVENOUS
  Administered 2019-04-18: 50 mg via INTRAVENOUS

## 2019-04-18 MED ORDER — PROPOFOL 10 MG/ML IV BOLUS
INTRAVENOUS | Status: AC
  Filled 2019-04-18: qty 20

## 2019-04-18 MED ORDER — PHENAZOPYRIDINE HCL 100 MG PO TABS
200.0000 mg | ORAL_TABLET | Freq: Once | ORAL | Status: DC
  Filled 2019-04-18: qty 2

## 2019-04-18 MED ORDER — PHENAZOPYRIDINE HCL 200 MG PO TABS
ORAL | Status: DC | PRN
  Administered 2019-04-18: 09:00:00 30 mL via INTRAVESICAL

## 2019-04-18 MED ORDER — LIDOCAINE 2% (20 MG/ML) 5 ML SYRINGE
INTRAMUSCULAR | Status: AC
  Filled 2019-04-18: qty 10

## 2019-04-18 MED ORDER — LIDOCAINE HCL (CARDIAC) PF 100 MG/5ML IV SOSY
PREFILLED_SYRINGE | INTRAVENOUS | Status: DC | PRN
  Administered 2019-04-18: 40 mg via INTRAVENOUS
  Administered 2019-04-18: 100 mg via INTRAVENOUS

## 2019-04-18 MED ORDER — STERILE WATER FOR IRRIGATION IR SOLN
Status: DC | PRN
  Administered 2019-04-18: 3000 mL via INTRAVESICAL

## 2019-04-18 MED ORDER — SEVOFLURANE IN SOLN
RESPIRATORY_TRACT | Status: AC
  Filled 2019-04-18: qty 250

## 2019-04-18 MED ORDER — STERILE WATER FOR IRRIGATION IR SOLN
Status: DC | PRN
  Administered 2019-04-18: 500 mL

## 2019-04-18 MED ORDER — PHENYLEPHRINE HCL (PRESSORS) 10 MG/ML IV SOLN
INTRAVENOUS | Status: AC
  Filled 2019-04-18: qty 1

## 2019-04-18 MED ORDER — ONDANSETRON HCL 4 MG/2ML IJ SOLN
INTRAMUSCULAR | Status: DC | PRN
  Administered 2019-04-18: 4 mg via INTRAVENOUS

## 2019-04-18 MED ORDER — EPHEDRINE 5 MG/ML INJ
INTRAVENOUS | Status: AC
  Filled 2019-04-18: qty 10

## 2019-04-18 MED ORDER — LACTATED RINGERS IV SOLN: INTRAVENOUS | Status: DC | PRN

## 2019-04-18 MED ORDER — FENTANYL CITRATE (PF) 100 MCG/2ML IJ SOLN
INTRAMUSCULAR | Status: DC | PRN
  Administered 2019-04-18 (×2): 50 ug via INTRAVENOUS

## 2019-04-18 MED ORDER — PHENYLEPHRINE 40 MCG/ML (10ML) SYRINGE FOR IV PUSH (FOR BLOOD PRESSURE SUPPORT)
PREFILLED_SYRINGE | INTRAVENOUS | Status: AC
  Filled 2019-04-18: qty 10

## 2019-04-18 SURGICAL SUPPLY — 22 items
BAG DRAIN URO TABLE W/ADPT NS (BAG) ×3 IMPLANT
BAG DRN 8 ADPR NS SKTRN CSTL (BAG) ×1
BAG HAMPER (MISCELLANEOUS) ×3 IMPLANT
CATH ROBINSON RED A/P 16FR (CATHETERS) ×2 IMPLANT
CLOTH BEACON ORANGE TIMEOUT ST (SAFETY) ×3 IMPLANT
DECANTER SPIKE VIAL GLASS SM (MISCELLANEOUS) ×2 IMPLANT
GLOVE BIO SURGEON STRL SZ8 (GLOVE) ×2 IMPLANT
GLOVE BIOGEL PI IND STRL 6.5 (GLOVE) IMPLANT
GLOVE BIOGEL PI IND STRL 7.0 (GLOVE) ×2 IMPLANT
GLOVE BIOGEL PI INDICATOR 6.5 (GLOVE) ×2
GLOVE BIOGEL PI INDICATOR 7.0 (GLOVE) ×4
GLOVE ECLIPSE 6.5 STRL STRAW (GLOVE) ×2 IMPLANT
GOWN STRL REUS W/TWL LRG LVL3 (GOWN DISPOSABLE) ×3 IMPLANT
GOWN STRL REUS W/TWL XL LVL3 (GOWN DISPOSABLE) ×3 IMPLANT
KIT TURNOVER CYSTO (KITS) ×3 IMPLANT
MANIFOLD NEPTUNE II (INSTRUMENTS) ×3 IMPLANT
PACK CYSTO (CUSTOM PROCEDURE TRAY) ×3 IMPLANT
PAD ARMBOARD 7.5X6 YLW CONV (MISCELLANEOUS) ×3 IMPLANT
SYR 20ML LL LF (SYRINGE) ×6 IMPLANT
SYR 50ML LL SCALE MARK (SYRINGE) ×2 IMPLANT
WATER STERILE IRR 3000ML UROMA (IV SOLUTION) ×3 IMPLANT
WATER STERILE IRR 500ML POUR (IV SOLUTION) ×2 IMPLANT

## 2019-04-18 NOTE — Anesthesia Preprocedure Evaluation (Signed)
Anesthesia Evaluation  Patient identified by MRN, date of birth, ID band Patient awake    Reviewed: Allergy & Precautions, NPO status , Patient's Chart, lab work & pertinent test results, reviewed documented beta blocker date and time   History of Anesthesia Complications Negative for: history of anesthetic complications  Airway Mallampati: II  TM Distance: >3 FB Neck ROM: Full    Dental  (+) Dental Advisory Given, Missing, Caps   Pulmonary shortness of breath and with exertion, sleep apnea (mouth gaurd) ,    breath sounds clear to auscultation       Cardiovascular Exercise Tolerance: Poor hypertension, Pt. on medications and Pt. on home beta blockers (-) angina Rhythm:Regular Rate:Normal  '18 stress Dobutamine: normal study   Neuro/Psych PSYCHIATRIC DISORDERS Anxiety Depression  Neuromuscular disease    GI/Hepatic GERD  Medicated and Poorly Controlled,(+)     substance abuse  ,   Endo/Other  Morbid obesity  Renal/GU negative Renal ROS     Musculoskeletal  (+) Arthritis  (back pain), Fibromyalgia -  Abdominal (+) + obese,   Peds  Hematology   Anesthesia Other Findings   Reproductive/Obstetrics                             Anesthesia Physical  Anesthesia Plan  ASA: III  Anesthesia Plan: General   Post-op Pain Management:    Induction: Intravenous  PONV Risk Score and Plan: 4 or greater and Ondansetron, Dexamethasone and Metaclopromide  Airway Management Planned: Oral ETT  Additional Equipment:   Intra-op Plan:   Post-operative Plan: Extubation in OR  Informed Consent: I have reviewed the patients History and Physical, chart, labs and discussed the procedure including the risks, benefits and alternatives for the proposed anesthesia with the patient or authorized representative who has indicated his/her understanding and acceptance.     Dental advisory given  Plan  Discussed with: CRNA and Surgeon  Anesthesia Plan Comments:         Anesthesia Quick Evaluation

## 2019-04-18 NOTE — Discharge Instructions (Signed)
Interstitial Cystitis  Interstitial cystitis is inflammation of the bladder. This may cause pain in the bladder area as well as a frequent and urgent need to urinate. The bladder is a hollow organ in the lower part of the abdomen. It stores urine after the urine is made in the kidneys. The severity of interstitial cystitis can vary from person to person. You may have flare-ups, and then your symptoms may go away for a while. For many people, it becomes a long-term (chronic) problem. What are the causes? The cause of this condition is not known. What increases the risk? The following factors may make you more likely to develop this condition:  You are female.  You have fibromyalgia.  You have irritable bowel syndrome (IBS).  You have endometriosis. This condition may be aggravated by:  Stress.  Smoking.  Spicy foods. What are the signs or symptoms? Symptoms of interstitial cystitis vary, and they can change over time. Symptoms may include:  Discomfort or pain in the bladder area, which is in the lower abdomen. Pain can range from mild to severe. The pain may change in intensity as the bladder fills with urine or as it empties.  Pain in the pelvic area, between the hip bones.  An urgent need to urinate.  Frequent urination.  Pain during urination.  Pain during sex.  Blood in the urine. For women, symptoms often get worse during menstruation. How is this diagnosed? This condition is diagnosed based on your symptoms, your medical history, and a physical exam. You may have tests to rule out other conditions, such as:  Urine tests.  Cystoscopy. For this test, a tool similar to a very thin telescope is used to look into your bladder.  Biopsy. This involves taking a sample of tissue from the bladder to be examined under a microscope. How is this treated? There is no cure for this condition, but treatment can help you control your symptoms. Work closely with your health care  provider to find the most effective treatments for you. Treatment options may include:  Medicines to relieve pain and reduce how often you feel the need to urinate.  Learning ways to control when you urinate (bladder training).  Lifestyle changes, such as changing your diet or taking steps to control stress.  Using a device that provides electrical stimulation to your nerves, which can relieve pain (neuromodulation therapy). The device is placed on your back, where it blocks the nerves that cause you to feel pain in your bladder area.  A procedure that stretches your bladder by filling it with air or fluid.  Surgery. This is rare. It is only done for extreme cases, if other treatments do not help. Follow these instructions at home: Bladder training   Use bladder training techniques as directed. Techniques may include: ? Urinating at scheduled times. ? Training yourself to delay urination. ? Doing exercises (Kegel exercises) to strengthen the muscles that control urine flow.  Keep a bladder diary. ? Write down the times that you urinate and any symptoms that you have. This can help you find out which foods, liquids, or activities make your symptoms worse. ? Use your bladder diary to schedule bathroom trips. If you are away from home, plan to be near a bathroom at each of your scheduled times.  Make sure that you urinate just before you leave the house and just before you go to bed. Eating and drinking  Make dietary changes as recommended by your health care provider. You  may need to avoid: ? Spicy foods. ? Foods that contain a lot of potassium.  Limit your intake of beverages that make you need to urinate. These include: ? Caffeinated beverages like soda, coffee, and tea. ? Alcohol. General instructions  Take over-the-counter and prescription medicines only as told by your health care provider.  Do not drink alcohol.  You can try a warm or cool compress over your bladder for  comfort.  Avoid wearing tight clothing.  Do not use any products that contain nicotine or tobacco, such as cigarettes and e-cigarettes. If you need help quitting, ask your health care provider.  Keep all follow-up visits as told by your health care provider. This is important. Contact a health care provider if you have:  Symptoms that do not get better with treatment.  Pain or discomfort that gets worse.  More frequent urges to urinate.  A fever. Get help right away if:  You have no control over when you urinate. Summary  Interstitial cystitis is inflammation of the bladder.  This condition may cause pain in the bladder area as well as a frequent and urgent need to urinate.  You may have flare-ups of the condition, and then it may go away for a while. For many people, it becomes a long-term (chronic) problem.  There is no cure for interstitial cystitis, but treatment methods are available to control your symptoms. This information is not intended to replace advice given to you by your health care provider. Make sure you discuss any questions you have with your health care provider. Document Revised: 12/12/2016 Document Reviewed: 11/24/2016 Elsevier Patient Education  2020 [place] After These instructions provide you with information about caring for yourself after your procedure. Your health care provider may also give you more specific instructions. Your treatment has been planned according to current medical practices, but problems sometimes occur. Call your health care provider if you have any problems or questions after your procedure. What can I expect after the procedure? After your procedure, you may:  Feel sleepy for several hours.  Feel clumsy and have poor balance for several hours.  Feel forgetful about what happened after the procedure.  Have poor judgment for several hours.  Feel nauseous or vomit.  Have a sore  throat if you had a breathing tube during the procedure. Follow these instructions at home: For at least 24 hours after the procedure:      Have a responsible adult stay with you. It is important to have someone help care for you until you are awake and alert.  Rest as needed.  Do not: ? Participate in activities in which you could fall or become injured. ? Drive. ? Use heavy machinery. ? Drink alcohol. ? Take sleeping pills or medicines that cause drowsiness. ? Make important decisions or sign legal documents. ? Take care of children on your own. Eating and drinking  Follow the diet that is recommended by your health care provider.  If you vomit, drink water, juice, or soup when you can drink without vomiting.  Make sure you have little or no nausea before eating solid foods. General instructions  Take over-the-counter and prescription medicines only as told by your health care provider.  If you have sleep apnea, surgery and certain medicines can increase your risk for breathing problems. Follow instructions from your health care provider about wearing your sleep device: ? Anytime you are sleeping, including during daytime naps. ? While taking  prescription pain medicines, sleeping medicines, or medicines that make you drowsy.  If you smoke, do not smoke without supervision.  Keep all follow-up visits as told by your health care provider. This is important. Contact a health care provider if:  You keep feeling nauseous or you keep vomiting.  You feel light-headed.  You develop a rash.  You have a fever. Get help right away if:  You have trouble breathing. Summary  For several hours after your procedure, you may feel sleepy and have poor judgment.  Have a responsible adult stay with you for at least 24 hours or until you are awake and alert. This information is not intended to replace advice given to you by your health care provider. Make sure you discuss any  questions you have with your health care provider. Document Revised: 03/30/2017 Document Reviewed: 04/22/2015 Elsevier Patient Education  [place].

## 2019-04-18 NOTE — H&P (Signed)
Urology Admission H&P  Chief Complaint: pelvic pain  History of Present Illness: [name] is a 69yo with a hx of pelvic pain and interstitial cystitis that has been refractory to medical therapy. He has urinary frequency, urgency, occasional dysuria. No hematuria. She has been doing bladder instillations which partial relieve her LUTS  Past Medical History:  Diagnosis Date  . Anxiety   . Arthritis    hands, feet  . Back pain    chronic  . Cataracts, bilateral    MD just watching right now  . Cystitides, interstitial, chronic   . Depression   . Fibromyalgia   . GERD (gastroesophageal reflux disease)   . Gout    feet  . High cholesterol   . Hypertension   . Shortness of breath    with exertion  . Sleep apnea    Uses appliance; used CPAP   . SVD (spontaneous vaginal delivery)    x 3  . Urinary disorder    bladder is in sling per pt  . Urinary tract infection   . Vertigo   . Wears glasses    Past Surgical History:  Procedure Laterality Date  . ABDOMINAL HYSTERECTOMY  1997   [place] , [place] LAT LUMBAR FUSION Right 04/28/2018   Procedure: ANTERIOR LATERAL INNER BODY LUMBAR FUSION L3-4, L4-5 2 LEVELS WITH INSTRUMENTATION AND ALLOGRAFT;  Surgeon: Phylliss Bob, MD;  Location: [facility];  Service: Orthopedics;  Laterality: Right;  . BACK SURGERY  10/20/2017  . bladder tack    . COLONOSCOPY N/A 08/23/2014   Procedure: COLONOSCOPY;  Surgeon: Rogene Houston, MD;  Location: [facility];  Service: Endoscopy;  Laterality: N/A;  1200  . KNEE SURGERY Left 05/26/14   meniscus tear  . LAPAROSCOPIC SALPINGO OOPHERECTOMY  02/17/2012   Procedure: LAPAROSCOPIC SALPINGO OOPHORECTOMY;  Surgeon: Jonnie Kind, MD;  Location: [facility];  Service: Gynecology;  Laterality: Bilateral;  . LASIK Bilateral   . TOE SURGERY    . UPPER GI ENDOSCOPY      Home Medications:  Current Facility-Administered Medications  Medication Dose Route Frequency Provider Last Rate Last Admin  .  ceFAZolin (ANCEF) IVPB 2g/100 mL premix  2 g Intravenous 30 min Pre-Op Shakevia Sarris, Candee Furbish, MD       Allergies: No Known Allergies  Family History  Problem Relation Age of Onset  . Atrial fibrillation Mother   . COPD Mother   . Hypertension Mother   . Hypertension Father   . Cancer Father        throat   . Drug abuse Brother   . Alcohol abuse Brother   . Early death Brother   . Fibromyalgia Daughter   . Paranoid behavior Son   . Alcohol abuse Son   . Hypertension Brother   . Sleep apnea Brother   . Alcohol abuse Brother   . Drug abuse Brother    Social History:  reports that she has never smoked. She has never used smokeless tobacco. She reports previous alcohol use. She reports that she does not use drugs.  Review of Systems  Genitourinary: Positive for difficulty urinating, dysuria, pelvic pain and urgency.  All other systems reviewed and are negative.   Physical Exam:  Vital signs in last 24 hours: Temp:  [97.9 F (36.6 C)] 97.9 F (36.6 C) (04/05 0740) Pulse Rate:  [54] 54 (04/05 0740) Resp:  [18] 18 (04/05 0740) BP: (147)/(64) 147/64 (04/05 0740) SpO2:  [94 %] 94 % (04/05  0740) Weight:  [90.7 kg] 90.7 kg (04/05 0740) Physical Exam  Constitutional: She is oriented to person, place, and time. She appears well-developed and well-nourished.  HENT:  Head: Normocephalic and atraumatic.  Eyes: Pupils are equal, round, and reactive to light. EOM are normal.  Neck: No thyromegaly present.  Cardiovascular: Normal rate and regular rhythm.  Respiratory: Effort normal. No respiratory distress.  GI: Soft. She exhibits no distension.  Musculoskeletal:        General: No edema. Normal range of motion.     Cervical back: Normal range of motion.  Neurological: She is alert and oriented to person, place, and time.  Skin: Skin is warm and dry.  Psychiatric: She has a normal mood and affect. Her behavior is normal. Judgment and thought content normal.    Laboratory Data:   Results for orders placed or performed during the hospital encounter of 04/18/19 (from the past 24 hour(s))  CBC     Status: None   Collection Time: 04/18/19  7:04 AM  Result Value Ref Range   WBC 6.5 4.0 - 10.5 K/uL   RBC 4.34 3.87 - 5.11 MIL/uL   Hemoglobin 13.2 12.0 - 15.0 g/dL   HCT 40.6 36.0 - 46.0 %   MCV 93.5 80.0 - 100.0 fL   MCH 30.4 26.0 - 34.0 pg   MCHC 32.5 30.0 - 36.0 g/dL   RDW 13.0 11.5 - 15.5 %   Platelets 251 150 - 400 K/uL   nRBC 0.0 0.0 - 0.2 %  Basic metabolic panel     Status: Abnormal   Collection Time: 04/18/19  7:04 AM  Result Value Ref Range   Sodium 136 135 - 145 mmol/L   Potassium 3.9 3.5 - 5.1 mmol/L   Chloride 101 98 - 111 mmol/L   CO2 28 22 - 32 mmol/L   Glucose, Bld 105 (H) 70 - 99 mg/dL   BUN 17 8 - 23 mg/dL   Creatinine, Ser 0.68 0.44 - 1.00 mg/dL   Calcium 8.8 (L) 8.9 - 10.3 mg/dL   GFR calc non Af Amer >60 >60 mL/min   GFR calc Af Amer >60 >60 mL/min   Anion gap 7 5 - 15   Recent Results (from the past 240 hour(s))  SARS CORONAVIRUS 2 (TAT 6-24 HRS) Nasopharyngeal Nasopharyngeal Swab     Status: None   Collection Time: 04/14/19  6:43 AM   Specimen: Nasopharyngeal Swab  Result Value Ref Range Status   SARS Coronavirus 2 NEGATIVE NEGATIVE Final    Comment: (NOTE) SARS-CoV-2 target nucleic acids are NOT DETECTED. The SARS-CoV-2 RNA is generally detectable in upper and lower respiratory specimens during the acute phase of infection. Negative results do not preclude SARS-CoV-2 infection, do not rule out co-infections with other pathogens, and should not be used as the sole basis for treatment or other patient management decisions. Negative results must be combined with clinical observations, patient history, and epidemiological information. The expected result is Negative. Fact Sheet for Patients: SugarRoll.be Fact Sheet for Healthcare Providers: https://www.woods-mathews.com/ This test is not yet  approved or cleared by the [place] FDA and  has been authorized for detection and/or diagnosis of SARS-CoV-2 by FDA under an Emergency Use Authorization (EUA). This EUA will remain  in effect (meaning this test can be used) for the duration of the COVID-19 declaration under Section 56 4(b)(1) of the Act, 21 U.S.C. section 360bbb-3(b)(1), unless the authorization is terminated or revoked sooner. Performed at [facility], [place]  [address], [place],  [address]    Creatinine: Recent Labs    04/18/19 [id number]  CREATININE 0.68   Baseline Creatinine: 0.7  Impression/Assessment:  68yo with pelvic pain and interstitial cystitis  Plan:  The risks/benefits/alterantives to hydrodistention was explained to the patient and she understands and wishes to proceed with surgery  Nicolette Bang 04/18/2019, 8:03 AM

## 2019-04-18 NOTE — Op Note (Signed)
.  Preoperative diagnosis: Interstitial cystitis, myalgia  Postoperative diagnosis: Same  Procedure: 1 cystoscopy 2. Hydrodistention to 100cm H2O 3. Bladder irrigation with bupivicaine and pyridium  Attending: Nicolette Bang  Anesthesia: General  Estimated blood loss: Minimal  Drains: none  Specimens: none  Antibiotics: Ancef  Findings:  No masses/lesions in the bladder. Bladder capacity 500cc. mild glomerulations.  Indications: Patient is a 69 year old female with a history of interstitial cystitis and myalgia.  After discussing treatment options, they decided proceed with hydrodistention,.   Procedure her in detail: The patient was brought to the operating room and a brief timeout was done to ensure correct patient, correct procedure, correct site.  General anesthesia was administered patient was placed in dorsal lithotomy position.  Their genitalia was then prepped and draped in usual sterile fashion.  A rigid 62 French cystoscope was passed in the urethra and the bladder.  Bladder was inspected and we noted no masses/lesions. Ureteral orifices were in the normal anatomic location. We then proceeded to hydrodistend the bladder to 100cm of water. Once we reached 100cm of water we then held the fluid in the bladder for 5 minutes. We then emptied the bladder and noted mild glomerulations and a bladder capacity of 500cc.  the bladder was then drained, and 30cc of bupivicaine with 200mg  pyridium was instilled into the bladder.  This then concluded the procedure which was well tolerated by patient.  Complications: None  Condition: Stable, extubated, transferred to PACU  Plan: Patient is to be discharged home. She is to followup in 2 weeks.

## 2019-04-18 NOTE — Anesthesia Procedure Notes (Signed)
Procedure Name: Intubation Date/Time: 04/18/2019 8:17 AM Performed by: [name], CRNA Pre-anesthesia Checklist: Patient identified, Emergency Drugs available, Suction available and Patient being monitored Patient Re-evaluated:Patient Re-evaluated prior to induction Oxygen Delivery Method: Circle system utilized Preoxygenation: Pre-oxygenation with 100% oxygen Induction Type: IV induction Laryngoscope Size: Mac and 3 Grade View: Grade I Tube type: Oral Tube size: 7.0 mm Number of attempts: 1 Placement Confirmation: ETT inserted through vocal cords under direct vision,  positive ETCO2 and breath sounds checked- equal and bilateral Secured at: 22 cm Tube secured with: Tape Dental Injury: Teeth and Oropharynx as per pre-operative assessment

## 2019-04-18 NOTE — Transfer of Care (Signed)
Immediate Anesthesia Transfer of Care Note  Patient: [name]  Procedure(s) Performed: CYSTOSCOPY/HYDRODISTENSION (N/A )  Patient Location: PACU  Anesthesia Type:General  Level of Consciousness: drowsy, patient cooperative and obtunded  Airway & Oxygen Therapy: Patient Spontanous Breathing and Patient connected to nasal cannula oxygen  Post-op Assessment: Report given to RN and Post -op Vital signs reviewed and stable  Post vital signs: Reviewed and stable  Last Vitals:  Vitals Value Taken Time  BP 159/73 04/18/19 0901  Temp    Pulse 65 04/18/19 0903  Resp 16 04/18/19 0903  SpO2 100 % 04/18/19 0903  Vitals shown include unvalidated device data.  Last Pain:  Vitals:   04/18/19 0740  TempSrc: Oral  PainSc: 0-No pain      Patients Stated Pain Goal: 6 ([id number])  Complications: No apparent anesthesia complications

## 2019-04-18 NOTE — Anesthesia Postprocedure Evaluation (Signed)
Anesthesia Post Note  Patient: [name]  Procedure(s) Performed: CYSTOSCOPY/HYDRODISTENSION (N/A )  Patient location during evaluation: PACU Anesthesia Type: General Level of consciousness: awake and alert and patient cooperative Pain management: satisfactory to patient Vital Signs Assessment: post-procedure vital signs reviewed and stable Respiratory status: spontaneous breathing and respiratory function stable Cardiovascular status: blood pressure returned to baseline and bradycardic Postop Assessment: no apparent nausea or vomiting Anesthetic complications: no     Last Vitals:  Vitals:   04/18/19 0901 04/18/19 0913  BP: (!) 159/73   Pulse: 74 60  Resp: 14 18  Temp:    SpO2: 100% 98%    Last Pain:  Vitals:   04/18/19 0900  TempSrc:   PainSc: 0-No pain                 [name]

## 2019-05-04 ENCOUNTER — Other Ambulatory Visit: Payer: Self-pay

## 2019-05-04 ENCOUNTER — Other Ambulatory Visit (HOSPITAL_COMMUNITY)
Admission: RE | Admit: 2019-05-04 | Discharge: 2019-05-04 | Disposition: A | Discharge status: Home / Self Care | Payer: Medicare Other | Source: Other Acute Inpatient Hospital | Attending: Urology | Admitting: Urology

## 2019-05-04 ENCOUNTER — Ambulatory Visit (INDEPENDENT_AMBULATORY_CARE_PROVIDER_SITE_OTHER): Payer: Medicare Other | Admitting: Urology

## 2019-05-04 ENCOUNTER — Encounter: Payer: Self-pay | Admitting: Urology

## 2019-05-04 VITALS — BP 152/75 | HR 58 | Temp 97.9°F | Ht 65.0 in | Wt 205.0 lb

## 2019-05-04 DIAGNOSIS — N3021 Other chronic cystitis with hematuria: Secondary | ICD-10-CM | POA: Insufficient documentation

## 2019-05-04 DIAGNOSIS — N3010 Interstitial cystitis (chronic) without hematuria: Secondary | ICD-10-CM

## 2019-05-04 LAB — POCT URINALYSIS DIPSTICK
Bilirubin, UA: NEGATIVE
Blood, UA: NEGATIVE
Glucose, UA: NEGATIVE
Ketones, UA: NEGATIVE
Nitrite, UA: NEGATIVE
Protein, UA: POSITIVE — AB
Spec Grav, UA: 1.02 (ref 1.010–1.025)
Urobilinogen, UA: 0.2 E.U./dL
pH, UA: 6 (ref 5.0–8.0)

## 2019-05-04 LAB — BLADDER SCAN AMB NON-IMAGING: Scan Result: 5.7

## 2019-05-04 NOTE — Progress Notes (Signed)
Urological Symptom Review  Patient is experiencing the following symptoms: Frequent urination Hard to postpone urination Get up at night to urinate   Review of Systems  Gastrointestinal (upper)  : Nausea Indigestion/heartburn  Gastrointestinal (lower) : Constipation  Constitutional : Night Sweats  fatigue  Skin: Negative for skin symptoms  Eyes:  Blurred vision  Ear/Nose/Throat : Sinus problems  Hematologic/Lymphatic: Negative for Hematologic/Lymphatic symptoms  Cardiovascular : Negative for cardiovascular symptoms  Respiratory : Cough Shortness of breath  Endocrine: Excessive thirst  Musculoskeletal: Joint pain  Neurological: Headaches Dizziness  Psychologic: Depression Anxiety

## 2019-05-04 NOTE — Patient Instructions (Signed)
Interstitial Cystitis  Interstitial cystitis is inflammation of the bladder. This may cause pain in the bladder area as well as a frequent and urgent need to urinate. The bladder is a hollow organ in the lower part of the abdomen. It stores urine after the urine is made in the kidneys. The severity of interstitial cystitis can vary from person to person. You may have flare-ups, and then your symptoms may go away for a while. For many people, it becomes a long-term (chronic) problem. What are the causes? The cause of this condition is not known. What increases the risk? The following factors may make you more likely to develop this condition:  You are female.  You have fibromyalgia.  You have irritable bowel syndrome (IBS).  You have endometriosis. This condition may be aggravated by:  Stress.  Smoking.  Spicy foods. What are the signs or symptoms? Symptoms of interstitial cystitis vary, and they can change over time. Symptoms may include:  Discomfort or pain in the bladder area, which is in the lower abdomen. Pain can range from mild to severe. The pain may change in intensity as the bladder fills with urine or as it empties.  Pain in the pelvic area, between the hip bones.  An urgent need to urinate.  Frequent urination.  Pain during urination.  Pain during sex.  Blood in the urine. For women, symptoms often get worse during menstruation. How is this diagnosed? This condition is diagnosed based on your symptoms, your medical history, and a physical exam. You may have tests to rule out other conditions, such as:  Urine tests.  Cystoscopy. For this test, a tool similar to a very thin telescope is used to look into your bladder.  Biopsy. This involves taking a sample of tissue from the bladder to be examined under a microscope. How is this treated? There is no cure for this condition, but treatment can help you control your symptoms. Work closely with your health care  provider to find the most effective treatments for you. Treatment options may include:  Medicines to relieve pain and reduce how often you feel the need to urinate.  Learning ways to control when you urinate (bladder training).  Lifestyle changes, such as changing your diet or taking steps to control stress.  Using a device that provides electrical stimulation to your nerves, which can relieve pain (neuromodulation therapy). The device is placed on your back, where it blocks the nerves that cause you to feel pain in your bladder area.  A procedure that stretches your bladder by filling it with air or fluid.  Surgery. This is rare. It is only done for extreme cases, if other treatments do not help. Follow these instructions at home: Bladder training   Use bladder training techniques as directed. Techniques may include: ? Urinating at scheduled times. ? Training yourself to delay urination. ? Doing exercises (Kegel exercises) to strengthen the muscles that control urine flow.  Keep a bladder diary. ? Write down the times that you urinate and any symptoms that you have. This can help you find out which foods, liquids, or activities make your symptoms worse. ? Use your bladder diary to schedule bathroom trips. If you are away from home, plan to be near a bathroom at each of your scheduled times.  Make sure that you urinate just before you leave the house and just before you go to bed. Eating and drinking  Make dietary changes as recommended by your health care provider. You   may need to avoid: ? Spicy foods. ? Foods that contain a lot of potassium.  Limit your intake of beverages that make you need to urinate. These include: ? Caffeinated beverages like soda, coffee, and tea. ? Alcohol. General instructions  Take over-the-counter and prescription medicines only as told by your health care provider.  Do not drink alcohol.  You can try a warm or cool compress over your bladder for  comfort.  Avoid wearing tight clothing.  Do not use any products that contain nicotine or tobacco, such as cigarettes and e-cigarettes. If you need help quitting, ask your health care provider.  Keep all follow-up visits as told by your health care provider. This is important. Contact a health care provider if you have:  Symptoms that do not get better with treatment.  Pain or discomfort that gets worse.  More frequent urges to urinate.  A fever. Get help right away if:  You have no control over when you urinate. Summary  Interstitial cystitis is inflammation of the bladder.  This condition may cause pain in the bladder area as well as a frequent and urgent need to urinate.  You may have flare-ups of the condition, and then it may go away for a while. For many people, it becomes a long-term (chronic) problem.  There is no cure for interstitial cystitis, but treatment methods are available to control your symptoms. This information is not intended to replace advice given to you by your health care provider. Make sure you discuss any questions you have with your health care provider. Document Revised: 12/12/2016 Document Reviewed: 11/24/2016 Elsevier Patient Education  2020 [employer]  

## 2019-05-04 NOTE — Progress Notes (Signed)
05/04/2019 1:25 PM   [name] October 18, 1950 [id number]  Referring provider: Curlene Labrum, MD [place],  [place] [address]  Interstitial cystitis  HPI: [name] is a 69yo here for followup for interstitial cystitis. She underwent hydrodistension on 04/18/2019. UA today shows leukocytes. She continues to have dysuria which is unimproved. She has urinary frequency, urgency and nocturia 5x. No hematuria.   PMH: Past Medical History:  Diagnosis Date  . Anxiety   . Arthritis    hands, feet  . Back pain    chronic  . Cataracts, bilateral    MD just watching right now  . Cystitides, interstitial, chronic   . Depression   . Fibromyalgia   . GERD (gastroesophageal reflux disease)   . Gout    feet  . High cholesterol   . Hypertension   . Shortness of breath    with exertion  . Sleep apnea    Uses appliance; used CPAP   . SVD (spontaneous vaginal delivery)    x 3  . Urinary disorder    bladder is in sling per pt  . Urinary tract infection   . Vertigo   . Wears glasses     Surgical History: Past Surgical History:  Procedure Laterality Date  . ABDOMINAL HYSTERECTOMY  1997   [place] , [place] LAT LUMBAR FUSION Right 04/28/2018   Procedure: ANTERIOR LATERAL INNER BODY LUMBAR FUSION L3-4, L4-5 2 LEVELS WITH INSTRUMENTATION AND ALLOGRAFT;  Surgeon: Phylliss Bob, MD;  Location: [facility];  Service: Orthopedics;  Laterality: Right;  . BACK SURGERY  10/20/2017  . bladder tack    . COLONOSCOPY N/A 08/23/2014   Procedure: COLONOSCOPY;  Surgeon: Rogene Houston, MD;  [facility];  Service: Endoscopy;  Laterality: N/A;  1200  . CYSTO WITH HYDRODISTENSION N/A 04/18/2019   Procedure: CYSTOSCOPY/HYDRODISTENSION;  Surgeon: Cleon Gustin, MD;  Location: [facility];  Service: Urology;  Laterality: N/A;  . KNEE SURGERY Left 05/26/14   meniscus tear  . LAPAROSCOPIC SALPINGO OOPHERECTOMY  02/17/2012   Procedure: LAPAROSCOPIC SALPINGO OOPHORECTOMY;   Surgeon: Jonnie Kind, MD;  Location: [facility];  Service: Gynecology;  Laterality: Bilateral;  . LASIK Bilateral   . TOE SURGERY    . UPPER GI ENDOSCOPY      Home Medications:  Allergies as of 05/04/2019   No Known Allergies     Medication List       Accurate as of May 04, 2019  1:25 PM. If you have any questions, ask your nurse or doctor.        ALPRAZolam 0.5 MG tablet Commonly known as: XANAX Take 0.5 mg by mouth 2 (two) times daily.   CAMPHOR-MENTHOL EX Apply 1 application topically 3 (three) times daily as needed (for pain).   DULoxetine 60 MG capsule Commonly known as: CYMBALTA Take 1 capsule (60 mg total) by mouth 2 (two) times daily.   fluticasone 50 MCG/ACT nasal spray Commonly known as: FLONASE Place 1 spray into both nostrils daily as needed for allergies.   HYDROcodone-acetaminophen 5-325 MG tablet Commonly known as: Norco Take 1 tablet by mouth every 4 (four) hours as needed for moderate pain.   lisinopril-hydrochlorothiazide 20-12.5 MG tablet Commonly known as: ZESTORETIC Take 1 tablet by mouth 2 (two) times daily.   Melatonin 10 MG Tabs Take 10 mg by mouth at bedtime as needed (sleep).   metoprolol tartrate 100 MG tablet Commonly known as: LOPRESSOR Take 100 mg by  mouth 2 (two) times daily.   MUSCLE & JOINT EX Apply 1 application topically 3 (three) times daily as needed (pain).   OPCON-A OP Place 1 drop into both eyes 3 (three) times daily as needed (dry/irritated eyes.).   OVER THE COUNTER MEDICATION Take 2 tablets by mouth daily. GOLI Gummies   PROBIOTIC DAILY PO Take 1 capsule by mouth daily.   simvastatin 20 MG tablet Commonly known as: ZOCOR Take 10 mg by mouth at bedtime.   VITAMIN B-12 PO Take 1 tablet by mouth daily.   VITAMIN D PO Take 1 tablet by mouth daily.       Allergies: No Known Allergies  Family History: Family History  Problem Relation Age of Onset  . Atrial fibrillation Mother   . COPD Mother   .  Hypertension Mother   . Hypertension Father   . Cancer Father        throat   . Drug abuse Brother   . Alcohol abuse Brother   . Early death Brother   . Fibromyalgia Daughter   . Paranoid behavior Son   . Alcohol abuse Son   . Hypertension Brother   . Sleep apnea Brother   . Alcohol abuse Brother   . Drug abuse Brother     Social History:  reports that she has never smoked. She has never used smokeless tobacco. She reports previous alcohol use. She reports that she does not use drugs.  ROS: All other review of systems were reviewed and are negative except what is noted above in HPI  Physical Exam: BP (!) 152/75   Pulse (!) 58   Temp 97.9 F (36.6 C)   Ht 5\' 5"  (1.651 m)   Wt 205 lb (93 kg)   LMP  (LMP Unknown)   BMI 34.11 kg/m   Constitutional:  Alert and oriented, No acute distress. HEENT: [place] AT, moist mucus membranes.  Trachea midline, no masses. Cardiovascular: No clubbing, cyanosis, or edema. Respiratory: Normal respiratory effort, no increased work of breathing. GI: Abdomen is soft, nontender, nondistended, no abdominal masses GU: No CVA tenderness.  Lymph: No cervical or inguinal lymphadenopathy. Skin: No rashes, bruises or suspicious lesions. Neurologic: Grossly intact, no focal deficits, moving all 4 extremities. Psychiatric: Normal mood and affect.  Laboratory Data: Lab Results  Component Value Date   WBC 6.5 04/18/2019   HGB 13.2 04/18/2019   HCT 40.6 04/18/2019   MCV 93.5 04/18/2019   PLT 251 04/18/2019    Lab Results  Component Value Date   CREATININE 0.68 04/18/2019    No results found for: PSA  No results found for: TESTOSTERONE  Lab Results  Component Value Date   HGBA1C 5.9 (H) 05/23/2013    Urinalysis    Component Value Date/Time   COLORURINE YELLOW 04/21/2018 1044   APPEARANCEUR CLEAR 04/21/2018 1044   LABSPEC 1.023 04/21/2018 1044   PHURINE 5.0 04/21/2018 1044   GLUCOSEU NEGATIVE 04/21/2018 1044   HGBUR NEGATIVE 04/21/2018  1044   BILIRUBINUR neg 05/04/2019 1316   KETONESUR NEGATIVE 04/21/2018 1044   PROTEINUR Positive (A) 05/04/2019 1316   PROTEINUR NEGATIVE 04/21/2018 1044   UROBILINOGEN 0.2 05/04/2019 1316   UROBILINOGEN 0.2 02/12/2012 1510   NITRITE neg 05/04/2019 1316   NITRITE NEGATIVE 04/21/2018 1044   LEUKOCYTESUR Moderate (2+) (A) 05/04/2019 1316   LEUKOCYTESUR MODERATE (A) 04/21/2018 1044    Lab Results  Component Value Date   BACTERIA NONE SEEN 04/21/2018    Pertinent Imaging:  No results found for  this or any previous visit. No results found for this or any previous visit. No results found for this or any previous visit. No results found for this or any previous visit. No results found for this or any previous visit. No results found for this or any previous visit. No results found for this or any previous visit. No results found for this or any previous visit.  Assessment & Plan:    1. Chronic cystitis with hematuria -Urine for culture, will call with results - POCT urinalysis dipstick - BLADDER SCAN AMB NON-IMAGING  2. IC (interstitial cystitis) -Patient has not improved since her hydrodistension. If she fails to improve I will have her seen by pelvic floor PT.    No follow-ups on file.  Nicolette Bang, MD  [facility] [place]

## 2019-05-06 LAB — URINE CULTURE: Culture: 80000 — AB

## 2019-05-10 DIAGNOSIS — N3000 Acute cystitis without hematuria: Secondary | ICD-10-CM | POA: Diagnosis not present

## 2019-05-10 DIAGNOSIS — R300 Dysuria: Secondary | ICD-10-CM | POA: Diagnosis not present

## 2019-05-11 ENCOUNTER — Other Ambulatory Visit: Payer: Self-pay

## 2019-05-11 ENCOUNTER — Emergency Department (HOSPITAL_COMMUNITY): Payer: Medicare Other

## 2019-05-11 ENCOUNTER — Emergency Department (HOSPITAL_COMMUNITY)
Admission: EM | Admit: 2019-05-11 | Discharge: 2019-05-11 | Disposition: A | Discharge status: Home / Self Care | Payer: Medicare Other | Attending: Emergency Medicine | Admitting: Emergency Medicine

## 2019-05-11 ENCOUNTER — Encounter (HOSPITAL_COMMUNITY): Payer: Self-pay | Admitting: Emergency Medicine

## 2019-05-11 DIAGNOSIS — H6690 Otitis media, unspecified, unspecified ear: Secondary | ICD-10-CM

## 2019-05-11 DIAGNOSIS — K8080 Other cholelithiasis without obstruction: Secondary | ICD-10-CM | POA: Diagnosis not present

## 2019-05-11 DIAGNOSIS — M797 Fibromyalgia: Secondary | ICD-10-CM | POA: Diagnosis not present

## 2019-05-11 DIAGNOSIS — R935 Abnormal findings on diagnostic imaging of other abdominal regions, including retroperitoneum: Secondary | ICD-10-CM | POA: Diagnosis not present

## 2019-05-11 DIAGNOSIS — N839 Noninflammatory disorder of ovary, fallopian tube and broad ligament, unspecified: Secondary | ICD-10-CM

## 2019-05-11 DIAGNOSIS — N289 Disorder of kidney and ureter, unspecified: Secondary | ICD-10-CM

## 2019-05-11 DIAGNOSIS — H6692 Otitis media, unspecified, left ear: Secondary | ICD-10-CM | POA: Insufficient documentation

## 2019-05-11 DIAGNOSIS — K8020 Calculus of gallbladder without cholecystitis without obstruction: Secondary | ICD-10-CM | POA: Insufficient documentation

## 2019-05-11 DIAGNOSIS — I10 Essential (primary) hypertension: Secondary | ICD-10-CM | POA: Diagnosis not present

## 2019-05-11 DIAGNOSIS — K5900 Constipation, unspecified: Secondary | ICD-10-CM | POA: Diagnosis not present

## 2019-05-11 DIAGNOSIS — K219 Gastro-esophageal reflux disease without esophagitis: Secondary | ICD-10-CM | POA: Insufficient documentation

## 2019-05-11 DIAGNOSIS — I7090 Unspecified atherosclerosis: Secondary | ICD-10-CM | POA: Diagnosis not present

## 2019-05-11 DIAGNOSIS — R9341 Abnormal radiologic findings on diagnostic imaging of renal pelvis, ureter, or bladder: Secondary | ICD-10-CM | POA: Insufficient documentation

## 2019-05-11 DIAGNOSIS — Z79899 Other long term (current) drug therapy: Secondary | ICD-10-CM | POA: Diagnosis not present

## 2019-05-11 DIAGNOSIS — I700 Atherosclerosis of aorta: Secondary | ICD-10-CM | POA: Diagnosis not present

## 2019-05-11 DIAGNOSIS — R1030 Lower abdominal pain, unspecified: Secondary | ICD-10-CM | POA: Diagnosis present

## 2019-05-11 LAB — URINALYSIS, ROUTINE W REFLEX MICROSCOPIC
Bacteria, UA: NONE SEEN
Bilirubin Urine: NEGATIVE
Glucose, UA: NEGATIVE mg/dL
Hgb urine dipstick: NEGATIVE
Ketones, ur: NEGATIVE mg/dL
Nitrite: NEGATIVE
Protein, ur: NEGATIVE mg/dL
Specific Gravity, Urine: 1.014 (ref 1.005–1.030)
pH: 8 (ref 5.0–8.0)

## 2019-05-11 LAB — COMPREHENSIVE METABOLIC PANEL
ALT: 21 U/L (ref 0–44)
AST: 17 U/L (ref 15–41)
Albumin: 4.4 g/dL (ref 3.5–5.0)
Alkaline Phosphatase: 57 U/L (ref 38–126)
Anion gap: 11 (ref 5–15)
BUN: 18 mg/dL (ref 8–23)
CO2: 30 mmol/L (ref 22–32)
Calcium: 9.4 mg/dL (ref 8.9–10.3)
Chloride: 96 mmol/L — ABNORMAL LOW (ref 98–111)
Creatinine, Ser: 0.73 mg/dL (ref 0.44–1.00)
GFR calc Af Amer: >60 mL/min (ref >60)
GFR calc non Af Amer: >60 mL/min (ref >60)
Glucose, Bld: 99 mg/dL (ref 70–99)
Potassium: 4.2 mmol/L (ref 3.5–5.1)
Sodium: 137 mmol/L (ref 135–145)
Total Bilirubin: 0.7 mg/dL (ref 0.3–1.2)
Total Protein: 6.8 g/dL (ref 6.5–8.1)

## 2019-05-11 LAB — LIPASE, BLOOD: Lipase: 19 U/L (ref 11–51)

## 2019-05-11 LAB — CBC
HCT: 41.5 % (ref 36.0–46.0)
Hemoglobin: 13.7 g/dL (ref 12.0–15.0)
MCH: 30.2 pg (ref 26.0–34.0)
MCHC: 33 g/dL (ref 30.0–36.0)
MCV: 91.4 fL (ref 80.0–100.0)
Platelets: 237 10*3/uL (ref 150–400)
RBC: 4.54 MIL/uL (ref 3.87–5.11)
RDW: 13 % (ref 11.5–15.5)
WBC: 7.6 10*3/uL (ref 4.0–10.5)
nRBC: 0 % (ref 0.0–0.2)

## 2019-05-11 MED ORDER — SODIUM CHLORIDE 0.9 % IV BOLUS
1000.0000 mL | Freq: Once | INTRAVENOUS | Status: AC
  Administered 2019-05-11: 1000 mL via INTRAVENOUS

## 2019-05-11 MED ORDER — POLYETHYLENE GLYCOL 3350 17 G PO PACK
17.0000 g | PACK | Freq: Every day | ORAL | 0 refills | Status: DC
Start: 2019-05-11 — End: 2022-03-31

## 2019-05-11 MED ORDER — IOHEXOL 300 MG/ML  SOLN
100.0000 mL | Freq: Once | INTRAMUSCULAR | Status: AC | PRN
  Administered 2019-05-11: 100 mL via INTRAVENOUS

## 2019-05-11 MED ORDER — ONDANSETRON HCL 4 MG/2ML IJ SOLN
4.0000 mg | Freq: Once | INTRAMUSCULAR | Status: AC
  Administered 2019-05-11: 4 mg via INTRAVENOUS
  Filled 2019-05-11: qty 2

## 2019-05-11 MED ORDER — AMOXICILLIN-POT CLAVULANATE 875-125 MG PO TABS
1.0000 | ORAL_TABLET | Freq: Two times a day (BID) | ORAL | 0 refills | Status: DC
Start: 2019-05-11 — End: 2019-05-25

## 2019-05-11 NOTE — ED Triage Notes (Signed)
Stomach is swollen on the LT side, nausea and feels like she has knots in that area. Dx with UTI at pcp yesterday started on abx

## 2019-05-11 NOTE — ED Provider Notes (Addendum)
[facility] EMERGENCY DEPARTMENT Provider Note   CSN: [id number] Arrival date & time: 05/11/19  1115     History Chief Complaint  Patient presents with  . Abdominal Pain    [name] is a 69 y.o. female.  HPI   69 year old female with a history of anxiety/depression, arthritis, chronic back pain, interstitial cystitis, fibromyalgia, GERD, hyperlipidemia, hypertension, shortness of breath on exertion, sleep apnea, vertigo, who presents the emergency department today for evaluation of lower abdominal discomfort.  States that it feels like she is ovulating.  Symptoms have been present for the last several weeks following a cystoscopy.  She also feels like she has lower abdominal swelling.  She has associated nausea which she states is chronic and has been present since prior to the procedure.  She reports no vomiting.  She did have some diarrhea earlier in the week however she is now constipated.  She took a stool softener yesterday and had a small amount of output.  States she was seen by her urologist and started on antibiotics for suspected uti.  Denies fevers. She does have h/o fecal impaction.  She also adds the her left ear has been hurting .   Past Medical History:  Diagnosis Date  . Anxiety   . Arthritis    hands, feet  . Back pain    chronic  . Cataracts, bilateral    MD just watching right now  . Cystitides, interstitial, chronic   . Depression   . Fibromyalgia   . GERD (gastroesophageal reflux disease)   . Gout    feet  . High cholesterol   . Hypertension   . Shortness of breath    with exertion  . Sleep apnea    Uses appliance; used CPAP   . SVD (spontaneous vaginal delivery)    x 3  . Urinary disorder    bladder is in sling per pt  . Urinary tract infection   . Vertigo   . Wears glasses     Patient Active Problem List   Diagnosis Date Noted  . IC (interstitial cystitis) 01/05/2019  . Bladder pain 01/05/2019  . Radiculopathy 04/28/2018  .  Vasomotor instability 02/26/2015  . Dizziness and giddiness 08/14/2014  . Gait difficulty 08/14/2014  . Depression 03/01/2014    Past Surgical History:  Procedure Laterality Date  . ABDOMINAL HYSTERECTOMY  1997   [place] , [place] LAT LUMBAR FUSION Right 04/28/2018   Procedure: ANTERIOR LATERAL INNER BODY LUMBAR FUSION L3-4, L4-5 2 LEVELS WITH INSTRUMENTATION AND ALLOGRAFT;  Surgeon: Phylliss Bob, MD;  Location: [facility];  Service: Orthopedics;  Laterality: Right;  . BACK SURGERY  10/20/2017  . bladder tack    . COLONOSCOPY N/A 08/23/2014   Procedure: COLONOSCOPY;  Surgeon: Rogene Houston, MD;  [facility];  Service: Endoscopy;  Laterality: N/A;  1200  . CYSTO WITH HYDRODISTENSION N/A 04/18/2019   Procedure: CYSTOSCOPY/HYDRODISTENSION;  Surgeon: Cleon Gustin, MD;  Location[facility];  Service: Urology;  Laterality: N/A;  . KNEE SURGERY Left 05/26/14   meniscus tear  . LAPAROSCOPIC SALPINGO OOPHERECTOMY  02/17/2012   Procedure: LAPAROSCOPIC SALPINGO OOPHORECTOMY;  Surgeon: Jonnie Kind, MD;  Location: [facility];  Service: Gynecology;  Laterality: Bilateral;  . LASIK Bilateral   . TOE SURGERY    . UPPER GI ENDOSCOPY       OB History    Gravida  4   Para  3   Term  3  Preterm      AB  1   Living  3     SAB  1   TAB      Ectopic      Multiple      Live Births  3           Family History  Problem Relation Age of Onset  . Atrial fibrillation Mother   . COPD Mother   . Hypertension Mother   . Hypertension Father   . Cancer Father        throat   . Drug abuse Brother   . Alcohol abuse Brother   . Early death Brother   . Fibromyalgia Daughter   . Paranoid behavior Son   . Alcohol abuse Son   . Hypertension Brother   . Sleep apnea Brother   . Alcohol abuse Brother   . Drug abuse Brother     Social History   Tobacco Use  . Smoking status: Never Smoker  . Smokeless tobacco: Never Used  Substance Use Topics  . Alcohol  use: Not Currently    Alcohol/week: 0.0 standard drinks    Comment: occassional, but patient states "currently does not drink"  . Drug use: No    Home Medications Prior to Admission medications   Medication Sig Start Date End Date Taking? Authorizing Provider  ALPRAZolam Duanne Moron) 0.5 MG tablet Take 0.5 mg by mouth 2 (two) times daily.  08/13/17  Yes [provider]  CAMPHOR-MENTHOL EX Apply 1 application topically 3 (three) times daily as needed (for pain).    Yes [provider]  Cyanocobalamin (VITAMIN B-12 PO) Take 1 tablet by mouth daily.   Yes [provider]  DULoxetine (CYMBALTA) 60 MG capsule Take 1 capsule (60 mg total) by mouth 2 (two) times daily. 08/01/16  Yes Cloria Spring, MD  fluticasone [facility]) 50 MCG/ACT nasal spray Place 1 spray into both nostrils daily as needed for allergies.    Yes [provider]  HYDROcodone-acetaminophen (NORCO) 5-325 MG tablet Take 1 tablet by mouth every 4 (four) hours as needed for moderate pain. 04/18/19  Yes McKenzie, Candee Furbish, MD  lisinopril-hydrochlorothiazide (PRINZIDE,ZESTORETIC) 20-12.5 MG per tablet Take 1 tablet by mouth 2 (two) times daily.   Yes [provider]  Melatonin 10 MG TABS Take 10 mg by mouth at bedtime as needed (sleep).    Yes [provider]  Menthol, Topical Analgesic, (MUSCLE & JOINT EX) Apply 1 application topically 3 (three) times daily as needed (pain).    Yes [provider]  metoprolol (LOPRESSOR) 100 MG tablet Take 100 mg by mouth 2 (two) times daily.    Yes [provider]  Naphazoline-Pheniramine (OPCON-A OP) Place 1 drop into both eyes 3 (three) times daily as needed (dry/irritated eyes.).   Yes [provider]  nitrofurantoin, macrocrystal-monohydrate, (MACROBID) 100 MG capsule Take 100 mg by mouth 2 (two) times daily. 05/10/19  Yes [provider]  OVER THE COUNTER MEDICATION Take 2 tablets by mouth daily. GOLI Gummies   Yes  [provider]  Probiotic Product (PROBIOTIC DAILY PO) Take 1 capsule by mouth daily.    Yes [provider]  simvastatin (ZOCOR) 20 MG tablet Take 10 mg by mouth at bedtime.    Yes [provider]  VITAMIN D PO Take 1 tablet by mouth daily.   Yes [provider]  amoxicillin-clavulanate (AUGMENTIN) 875-125 MG tablet Take 1 tablet by mouth every 12 (twelve) hours. 05/11/19  Nivedita Mirabella S, PA-C  polyethylene glycol (MIRALAX) 17 g packet Take 17 g by mouth daily. Dissolve one cap full in solution (water, gatorade, etc.) and administer once cap-full daily. You may titrate up daily by 1 cap-full until the patient is having pudding consistency of stools. After the patient is able to start passing softer stools they they can continue to take 1/2 cap-full daily for 2 weeks. 05/11/19   Danecia Underdown S, PA-C    Allergies    Patient has no known allergies.  Review of Systems   Review of Systems  Constitutional: Negative for chills and fever.  HENT: Positive for ear pain. Negative for sore throat.   Eyes: Negative for visual disturbance.  Respiratory: Positive for shortness of breath (chronic). Negative for cough.   Cardiovascular: Negative for chest pain.  Gastrointestinal: Positive for abdominal pain, constipation, diarrhea and nausea. Negative for vomiting.  Genitourinary: Negative for dysuria.  Musculoskeletal: Negative for myalgias.  Skin: Negative for rash.  Neurological: Negative for headaches.  All other systems reviewed and are negative.   Physical Exam Updated Vital Signs BP (!) 155/85 (BP Location: Right Arm)   Pulse 60   Temp (!) 97.5 F (36.4 C) (Oral)   Resp 18   Ht 5\' 5"  (1.651 m)   Wt 93 kg   LMP  (LMP Unknown)   SpO2 97%   BMI 34.11 kg/m   Physical Exam Vitals and nursing note reviewed.  Constitutional:      General: She is not in acute distress.    Appearance: She is well-developed.  HENT:     Head: Normocephalic and  atraumatic.     Ears:     Comments: Left TM is erythematous Eyes:     Conjunctiva/sclera: Conjunctivae normal.  Cardiovascular:     Rate and Rhythm: Normal rate and regular rhythm.     Heart sounds: Normal heart sounds. No murmur.  Pulmonary:     Effort: Pulmonary effort is normal. No respiratory distress.     Breath sounds: Normal breath sounds. No wheezing, rhonchi or rales.  Abdominal:     General: Bowel sounds are normal.     Palpations: Abdomen is soft.     Tenderness: There is abdominal tenderness (very mild) in the right lower quadrant, suprapubic area, left upper quadrant and left lower quadrant. There is no guarding or rebound.  Genitourinary:    Comments: dre completed with chaperone present. Soft light brown stool in the rectal vault. No evidence for impaction. Musculoskeletal:     Cervical back: Neck supple.  Skin:    General: Skin is warm and dry.  Neurological:     Mental Status: She is alert.     ED Results / Procedures / Treatments   Labs (all labs ordered are listed, but only abnormal results are displayed) Labs Reviewed  COMPREHENSIVE METABOLIC PANEL - Abnormal; Notable for the following components:      Result Value   Chloride 96 (*)    All other components within normal limits  URINALYSIS, ROUTINE W REFLEX MICROSCOPIC - Abnormal; Notable for the following components:   Color, Urine AMBER (*)    Leukocytes,Ua TRACE (*)    All other components within normal limits  URINE CULTURE  LIPASE, BLOOD  CBC    EKG None  Radiology CT ABDOMEN PELVIS W CONTRAST  Result Date: 05/11/2019 CLINICAL DATA:  Lower abdominal pain. Constipation. Recent urologic procedure. Urinary tract infection at PCP yesterday, started on antibiotics. Hysterectomy. EXAM: CT ABDOMEN AND PELVIS  WITH CONTRAST TECHNIQUE: Multidetector CT imaging of the abdomen and pelvis was performed using the standard protocol following bolus administration of intravenous contrast. CONTRAST:  128mL  OMNIPAQUE IOHEXOL 300 MG/ML  SOLN COMPARISON:  01/11/2012 FINDINGS: Lower chest: Clear lung bases. Mild cardiomegaly, without pericardial or pleural effusion. Hepatobiliary: Too small to characterize high right hepatic lobe lesion is similar in 2013 and can be presumed benign. Stone in the gallbladder neck of 1.4 cm. No acute cholecystitis or biliary duct dilatation. Pancreas: Normal, without mass or ductal dilatation. Spleen: Normal in size, without focal abnormality. Adrenals/Urinary Tract: Normal right adrenal gland. Minimal left adrenal nodularity is unchanged. At least 1 upper pole right renal tiny, 3-4 mm fat density lesion(s) consistent with angiomyolipoma. Normal left kidney, without hydronephrosis. No hydronephrosis. Stomach/Bowel: Normal stomach, without wall thickening. Scattered colonic diverticula. Colonic stool burden suggests constipation. Normal terminal ileum and appendix. Normal small bowel. Vascular/Lymphatic: Aortic and branch vessel atherosclerosis. No abdominopelvic adenopathy. Reproductive: Hysterectomy. An anterior left pelvic fluid density structure measures 2.7 x 2.4 cm, including on 75/2. This is positioned in the same region as the left ovary on the 2013 exam. Possible mild complexity within. Example coronal image 43. No residual right ovarian/adnexal lesion. Other: No significant free fluid. Bilateral tiny fat containing inguinal hernias. Musculoskeletal: L3-5 trans pedicle screw fixation. IMPRESSION: 1. Possible constipation. 2. No other explanation for patient's symptoms. 3. Cholelithiasis. 4. Aortic Atherosclerosis (ICD10-I70.0). 5. Left pelvic lesion may arise from the left ovary/adnexa. Per consensus criteria, given patient age and suggestion of possible complexity, further evaluation with pelvic ultrasound is recommended. This recommendation follows ACR consensus guidelines: White Paper of the ACR Incidental Findings Committee II on Adnexal Findings. J Am Coll Radiol  2013:10:675-681. 6. One definite and probable second tiny upper pole right renal angiomyolipoma. This appearance can be seen with tuberous sclerosis. Electronically Signed   By: Abigail Miyamoto M.D.   On: 05/11/2019 14:20    Procedures Procedures (including critical care time)  Medications Ordered in ED Medications  sodium chloride 0.9 % bolus 1,000 mL (0 mLs Intravenous Stopped 05/11/19 1528)  ondansetron (ZOFRAN) injection 4 mg (4 mg Intravenous Given 05/11/19 1302)  iohexol (OMNIPAQUE) 300 MG/ML solution 100 mL (100 mLs Intravenous Contrast Given 05/11/19 1345)    ED Course  I have reviewed the triage vital signs and the nursing notes.  Pertinent labs & imaging results that were available during my care of the patient were reviewed by me and considered in my medical decision making (see chart for details).    MDM Rules/Calculators/A&P                      69 y/o F c/o lower abd pain, abd distension, and nausea for 3 weeks.   VS reassuring. Low abd ttp on exam. Will get labs, ua, CT abd/pelvis  Reviewed/interpeted labs -  CBC w/o leukocytosis or anemia CMP w/o gross electrolyte derangement, normal kidney/liver fxn. Lipase wnl UA with some leukocytes, no nitrites. Is being tx with abx for uti. Can continue. Culture sent  CT abd/pelvis - with likely constipation. Cholelithiasis. Aortic Atherosclerosis. Left pelvic lesion may arise from the left ovary/adnexa. One definite and probable second tiny upper pole right renal angiomyolipoma. This appearance can be seen with tuberous sclerosis.   Pt was given IVF and nausea meds and on reassessment she states she feels well.   Suspect her sxs may be related to constipation. Will start her on miralax. Additionally, she appears to have otitis media  of the left ear. Will tx with augmentin. No complicating features identified such as mastoiditis etc. Advised to f/u with ob-gyn and pcp in regards to incidental findings. Advised on return precautions.  She voices understanding of the plan and reasons to return. All questions answered, pt stable for d/c.   Final Clinical Impression(s) / ED Diagnoses Final diagnoses:  Constipation, unspecified constipation type  Biliary calculus of other site without obstruction  Lesion of left ovary  Aortic atherosclerosis (HCC)  Renal lesion  Acute otitis media, unspecified otitis media type    Rx / DC Orders ED Discharge Orders         Ordered    polyethylene glycol (MIRALAX) 17 g packet  Daily     05/11/19 1502    amoxicillin-clavulanate (AUGMENTIN) 875-125 MG tablet  Every 12 hours     05/11/19 1554           Chaise Passarella S, PA-C 05/11/19 [place], Leydy Worthey S, PA-C 05/11/19 1554    Elnora Morrison, MD 05/11/19 1620

## 2019-05-11 NOTE — Discharge Instructions (Addendum)
Take miralax as directed.   You were given a prescription for antibiotics for a left ear infection. Please take the antibiotic prescription fully.    Follow up with your OB-GYN for further evaluation of the lesion noted to the left adnexal region.  Please follow up with your primary care provider within 5-7 days for re-evaluation of your symptoms and in regards to the incidental findings noted on your CT scan.  Please return to the ER sooner if you have any new or worsening symptoms, or if you have any of the following symptoms:  Abdominal pain that does not go away.  You have a fever.  You keep throwing up (vomiting).  The pain is felt only in portions of the abdomen. Pain in the right side could possibly be appendicitis. In an adult, pain in the left lower portion of the abdomen could be colitis or diverticulitis.  You pass bloody or black tarry stools.  There is bright red blood in the stool.  The constipation stays for more than 4 days.  There is belly (abdominal) or rectal pain.  You do not seem to be getting better.  You have any questions or concerns.

## 2019-05-11 NOTE — ED Notes (Signed)
Pt verbalized understanding d/c instructions and follow up, advised to pick prescriptions up at pharmacy. Pt ambulatory from dpt

## 2019-05-13 LAB — URINE CULTURE: Culture: <10000 — AB

## 2019-05-16 ENCOUNTER — Other Ambulatory Visit: Payer: Self-pay

## 2019-05-16 ENCOUNTER — Telehealth: Payer: Self-pay

## 2019-05-16 DIAGNOSIS — R3989 Other symptoms and signs involving the genitourinary system: Secondary | ICD-10-CM

## 2019-05-16 MED ORDER — SULFAMETHOXAZOLE-TRIMETHOPRIM 800-160 MG PO TABS: 1.0000 | ORAL_TABLET | Freq: Two times a day (BID) | ORAL | 0 refills | Status: DC

## 2019-05-16 NOTE — Telephone Encounter (Signed)
-----   Message from Cleon Gustin, MD sent at 05/13/2019 10:59 AM EDT ----- Bactrim DS BID for 7 days ----- Message ----- From: Dorisann Frames, RN Sent: 05/06/2019  11:55 AM EDT To: Cleon Gustin, MD  Urine culture

## 2019-05-16 NOTE — Telephone Encounter (Signed)
Pt notified. Pt was given antibiotic by PCP for ear infection and UTI symptoms.

## 2019-05-18 ENCOUNTER — Other Ambulatory Visit: Payer: Self-pay

## 2019-05-18 ENCOUNTER — Ambulatory Visit (INDEPENDENT_AMBULATORY_CARE_PROVIDER_SITE_OTHER): Payer: Medicare Other | Admitting: Urology

## 2019-05-18 ENCOUNTER — Encounter: Payer: Self-pay | Admitting: Urology

## 2019-05-18 VITALS — BP 146/74 | HR 54 | Temp 97.5°F | Ht 65.5 in | Wt 205.0 lb

## 2019-05-18 DIAGNOSIS — N3010 Interstitial cystitis (chronic) without hematuria: Secondary | ICD-10-CM | POA: Diagnosis not present

## 2019-05-18 DIAGNOSIS — N3021 Other chronic cystitis with hematuria: Secondary | ICD-10-CM

## 2019-05-18 DIAGNOSIS — R3989 Other symptoms and signs involving the genitourinary system: Secondary | ICD-10-CM

## 2019-05-18 NOTE — Patient Instructions (Signed)
Urinary Tract Infection, Adult A urinary tract infection (UTI) is an infection of any part of the urinary tract. The urinary tract includes:  The kidneys.  The ureters.  The bladder.  The urethra. These organs make, store, and get rid of pee (urine) in the body. What are the causes? This is caused by germs (bacteria) in your genital area. These germs grow and cause swelling (inflammation) of your urinary tract. What increases the risk? You are more likely to develop this condition if:  You have a small, thin tube (catheter) to drain pee.  You cannot control when you pee or poop (incontinence).  You are female, and: ? You use these methods to prevent pregnancy:  A medicine that kills sperm (spermicide).  A device that blocks sperm (diaphragm). ? You have low levels of a female hormone (estrogen). ? You are pregnant.  You have genes that add to your risk.  You are sexually active.  You take antibiotic medicines.  You have trouble peeing because of: ? A prostate that is bigger than normal, if you are female. ? A blockage in the part of your body that drains pee from the bladder (urethra). ? A kidney stone. ? A nerve condition that affects your bladder (neurogenic bladder). ? Not getting enough to drink. ? Not peeing often enough.  You have other conditions, such as: ? Diabetes. ? A weak disease-fighting system (immune system). ? Sickle cell disease. ? Gout. ? Injury of the spine. What are the signs or symptoms? Symptoms of this condition include:  Needing to pee right away (urgently).  Peeing often.  Peeing small amounts often.  Pain or burning when peeing.  Blood in the pee.  Pee that smells bad or not like normal.  Trouble peeing.  Pee that is cloudy.  Fluid coming from the vagina, if you are female.  Pain in the belly or lower back. Other symptoms include:  Throwing up (vomiting).  No urge to eat.  Feeling mixed up (confused).  Being tired  and grouchy (irritable).  A fever.  Watery poop (diarrhea). How is this treated? This condition may be treated with:  Antibiotic medicine.  Other medicines.  Drinking enough water. Follow these instructions at home:  Medicines  Take over-the-counter and prescription medicines only as told by your doctor.  If you were prescribed an antibiotic medicine, take it as told by your doctor. Do not stop taking it even if you start to feel better. General instructions  Make sure you: ? Pee until your bladder is empty. ? Do not hold pee for a long time. ? Empty your bladder after sex. ? Wipe from front to back after pooping if you are a female. Use each tissue one time when you wipe.  Drink enough fluid to keep your pee pale yellow.  Keep all follow-up visits as told by your doctor. This is important. Contact a doctor if:  You do not get better after 1-2 days.  Your symptoms go away and then come back. Get help right away if:  You have very bad back pain.  You have very bad pain in your lower belly.  You have a fever.  You are sick to your stomach (nauseous).  You are throwing up. Summary  A urinary tract infection (UTI) is an infection of any part of the urinary tract.  This condition is caused by germs in your genital area.  There are many risk factors for a UTI. These include having a small, thin   tube to drain pee and not being able to control when you pee or poop.  Treatment includes antibiotic medicines for germs.  Drink enough fluid to keep your pee pale yellow. This information is not intended to replace advice given to you by your health care provider. Make sure you discuss any questions you have with your health care provider. Document Revised: 12/17/2017 Document Reviewed: 07/09/2017 Elsevier Patient Education  2020 Elsevier Inc.  

## 2019-05-18 NOTE — Progress Notes (Signed)

## 2019-05-18 NOTE — Progress Notes (Signed)
05/18/2019 9:50 AM   [name] 22-Feb-1950 [id number]  Referring provider: Curlene Labrum, MD [place],  [place] [address]  Chronic cystitis  HPI: [name] is a 69yo here for followup for recurrent UTIs. She is currently on augmentin for an E coli UTI diagnosed on 05/04/2019. She has worsening abdominal distention and was evaluated in the ER and diagnosed with constipation and gallstones. She continues to have bladder pain when she wakes up in the morning.  CT for 05/11/2019 shows no GU abnormalities.  Urine culture from 4/28 showed insignificant growth   PMH: Past Medical History:  Diagnosis Date  . Anxiety   . Arthritis    hands, feet  . Back pain    chronic  . Cataracts, bilateral    MD just watching right now  . Cystitides, interstitial, chronic   . Depression   . Fibromyalgia   . GERD (gastroesophageal reflux disease)   . Gout    feet  . High cholesterol   . Hypertension   . Shortness of breath    with exertion  . Sleep apnea    Uses appliance; used CPAP   . SVD (spontaneous vaginal delivery)    x 3  . Urinary disorder    bladder is in sling per pt  . Urinary tract infection   . Vertigo   . Wears glasses     Surgical History: Past Surgical History:  Procedure Laterality Date  . ABDOMINAL HYSTERECTOMY  1997   [facility] , [place] LAT LUMBAR FUSION Right 04/28/2018   Procedure: ANTERIOR LATERAL INNER BODY LUMBAR FUSION L3-4, L4-5 2 LEVELS WITH INSTRUMENTATION AND ALLOGRAFT;  Surgeon: Phylliss Bob, MD;  Location: [facility];  Service: Orthopedics;  Laterality: Right;  . BACK SURGERY  10/20/2017  . bladder tack    . COLONOSCOPY N/A 08/23/2014   Procedure: COLONOSCOPY;  Surgeon: Rogene Houston, MD;  [facility];  Service: Endoscopy;  Laterality: N/A;  1200  . CYSTO WITH HYDRODISTENSION N/A 04/18/2019   Procedure: CYSTOSCOPY/HYDRODISTENSION;  Surgeon: Cleon Gustin, MD;  Location[facility];  Service: Urology;  Laterality:  N/A;  . KNEE SURGERY Left 05/26/14   meniscus tear  . LAPAROSCOPIC SALPINGO OOPHERECTOMY  02/17/2012   Procedure: LAPAROSCOPIC SALPINGO OOPHORECTOMY;  Surgeon: Jonnie Kind, MD;  Location: [facility];  Service: Gynecology;  Laterality: Bilateral;  . LASIK Bilateral   . TOE SURGERY    . UPPER GI ENDOSCOPY      Home Medications:  Allergies as of 05/18/2019   No Known Allergies     Medication List       Accurate as of May 18, 2019  9:50 AM. If you have any questions, ask your nurse or doctor.        ALPRAZolam 0.5 MG tablet Commonly known as: XANAX Take 0.5 mg by mouth 2 (two) times daily.   amoxicillin-clavulanate 875-125 MG tablet Commonly known as: AUGMENTIN Take 1 tablet by mouth every 12 (twelve) hours.   CAMPHOR-MENTHOL EX Apply 1 application topically 3 (three) times daily as needed (for pain).   DULoxetine 60 MG capsule Commonly known as: CYMBALTA Take 1 capsule (60 mg total) by mouth 2 (two) times daily.   fluticasone 50 MCG/ACT nasal spray Commonly known as: FLONASE Place 1 spray into both nostrils daily as needed for allergies.   HYDROcodone-acetaminophen 5-325 MG tablet Commonly known as: Norco Take 1 tablet by mouth every 4 (four) hours as needed for moderate  pain.   lisinopril-hydrochlorothiazide 20-12.5 MG tablet Commonly known as: ZESTORETIC Take 1 tablet by mouth 2 (two) times daily.   Melatonin 10 MG Tabs Take 10 mg by mouth at bedtime as needed (sleep).   metoprolol tartrate 100 MG tablet Commonly known as: LOPRESSOR Take 100 mg by mouth 2 (two) times daily.   MUSCLE & JOINT EX Apply 1 application topically 3 (three) times daily as needed (pain).   nitrofurantoin (macrocrystal-monohydrate) 100 MG capsule Commonly known as: MACROBID Take 100 mg by mouth 2 (two) times daily.   OPCON-A OP Place 1 drop into both eyes 3 (three) times daily as needed (dry/irritated eyes.).   OVER THE COUNTER MEDICATION Take 2 tablets by mouth daily. GOLI Gummies    polyethylene glycol 17 g packet Commonly known as: MiraLax Take 17 g by mouth daily. Dissolve one cap full in solution (water, gatorade, etc.) and administer once cap-full daily. You may titrate up daily by 1 cap-full until the patient is having pudding consistency of stools. After the patient is able to start passing softer stools they they can continue to take 1/2 cap-full daily for 2 weeks.   PROBIOTIC DAILY PO Take 1 capsule by mouth daily.   simvastatin 20 MG tablet Commonly known as: ZOCOR Take 10 mg by mouth at bedtime.   sulfamethoxazole-trimethoprim 800-160 MG tablet Commonly known as: BACTRIM DS Take 1 tablet by mouth every 12 (twelve) hours.   VITAMIN B-12 PO Take 1 tablet by mouth daily.   VITAMIN D PO Take 1 tablet by mouth daily.       Allergies: No Known Allergies  Family History: Family History  Problem Relation Age of Onset  . Atrial fibrillation Mother   . COPD Mother   . Hypertension Mother   . Hypertension Father   . Cancer Father        throat   . Drug abuse Brother   . Alcohol abuse Brother   . Early death Brother   . Fibromyalgia Daughter   . Paranoid behavior Son   . Alcohol abuse Son   . Hypertension Brother   . Sleep apnea Brother   . Alcohol abuse Brother   . Drug abuse Brother     Social History:  reports that she has never smoked. She has never used smokeless tobacco. She reports previous alcohol use. She reports that she does not use drugs.  ROS: All other review of systems were reviewed and are negative except what is noted above in HPI  Physical Exam: BP (!) 146/74   Pulse (!) 54   Temp (!) 97.5 F (36.4 C)   Ht 5' 5.5" (1.664 m)   Wt 205 lb (93 kg)   LMP  (LMP Unknown)   BMI 33.59 kg/m   Constitutional:  Alert and oriented, No acute distress. HEENT: [place] AT, moist mucus membranes.  Trachea midline, no masses. Cardiovascular: No clubbing, cyanosis, or edema. Respiratory: Normal respiratory effort, no increased work of  breathing. GI: Abdomen is soft, nontender, nondistended, no abdominal masses GU: No CVA tenderness.  Lymph: No cervical or inguinal lymphadenopathy. Skin: No rashes, bruises or suspicious lesions. Neurologic: Grossly intact, no focal deficits, moving all 4 extremities. Psychiatric: Normal mood and affect.  Laboratory Data: Lab Results  Component Value Date   WBC 7.6 05/11/2019   HGB 13.7 05/11/2019   HCT 41.5 05/11/2019   MCV 91.4 05/11/2019   PLT 237 05/11/2019    Lab Results  Component Value Date   CREATININE 0.73 05/11/2019  No results found for: PSA  No results found for: TESTOSTERONE  Lab Results  Component Value Date   HGBA1C 5.9 (H) 05/23/2013    Urinalysis    Component Value Date/Time   COLORURINE AMBER (A) 05/11/2019 1137   APPEARANCEUR CLEAR 05/11/2019 1137   LABSPEC 1.014 05/11/2019 1137   PHURINE 8.0 05/11/2019 1137   GLUCOSEU NEGATIVE 05/11/2019 1137   HGBUR NEGATIVE 05/11/2019 1137   BILIRUBINUR NEGATIVE 05/11/2019 1137   BILIRUBINUR neg 05/04/2019 1316   KETONESUR NEGATIVE 05/11/2019 1137   PROTEINUR NEGATIVE 05/11/2019 1137   UROBILINOGEN 0.2 05/04/2019 1316   UROBILINOGEN 0.2 02/12/2012 1510   NITRITE NEGATIVE 05/11/2019 1137   LEUKOCYTESUR TRACE (A) 05/11/2019 1137    Lab Results  Component Value Date   BACTERIA NONE SEEN 05/11/2019    Pertinent Imaging: 05/11/2019: Images reviewed and discussed with the patient No results found for this or any previous visit. No results found for this or any previous visit. No results found for this or any previous visit. No results found for this or any previous visit. No results found for this or any previous visit. No results found for this or any previous visit. No results found for this or any previous visit. No results found for this or any previous visit.  Assessment & Plan:    1. Chronic cystitis with hematuria -patient to finish augmentin. Patient to drop off urine after completing  augmentin. If it is negative we will start prophylactic antibiotics  2. IC (interstitial cystitis) -pelvic floor PT   No follow-ups on file.  Nicolette Bang, MD  [facility] [place]

## 2019-05-20 ENCOUNTER — Other Ambulatory Visit: Payer: Self-pay

## 2019-05-20 ENCOUNTER — Other Ambulatory Visit (HOSPITAL_COMMUNITY)
Admission: RE | Admit: 2019-05-20 | Discharge: 2019-05-20 | Disposition: A | Discharge status: Home / Self Care | Payer: Medicare Other | Source: Other Acute Inpatient Hospital | Attending: Urology | Admitting: Urology

## 2019-05-20 ENCOUNTER — Ambulatory Visit: Payer: Medicare Other

## 2019-05-20 DIAGNOSIS — N3021 Other chronic cystitis with hematuria: Secondary | ICD-10-CM

## 2019-05-20 DIAGNOSIS — R3989 Other symptoms and signs involving the genitourinary system: Secondary | ICD-10-CM | POA: Insufficient documentation

## 2019-05-20 NOTE — Progress Notes (Signed)
Pt. dropped of urine specimen and specimen sent to lab.

## 2019-05-22 LAB — URINE CULTURE

## 2019-05-25 ENCOUNTER — Encounter: Payer: Self-pay | Admitting: Obstetrics and Gynecology

## 2019-05-25 ENCOUNTER — Ambulatory Visit (INDEPENDENT_AMBULATORY_CARE_PROVIDER_SITE_OTHER): Payer: Medicare Other | Admitting: Obstetrics and Gynecology

## 2019-05-25 VITALS — BP 154/73 | HR 58 | Ht 65.5 in | Wt 204.6 lb

## 2019-05-25 DIAGNOSIS — Z87440 Personal history of urinary (tract) infections: Secondary | ICD-10-CM | POA: Diagnosis not present

## 2019-05-25 DIAGNOSIS — R109 Unspecified abdominal pain: Secondary | ICD-10-CM

## 2019-05-25 LAB — POCT URINALYSIS DIPSTICK
Blood, UA: NEGATIVE
Glucose, UA: NEGATIVE
Ketones, UA: NEGATIVE
Leukocytes, UA: NEGATIVE
Nitrite, UA: NEGATIVE
Protein, UA: NEGATIVE

## 2019-05-25 NOTE — Progress Notes (Signed)
PATIENT ID: [name], female     DOB: [birth year], 69 y.o.     MRN: [id number]   [name]     PATIENT NAME: [name]     MRN [id number]     DOB: 1950/04/01  CC & HPI:  [name] is a 69 y.o. female presenting today for abdominal discomfort.  She underwent laparoscopic salpingo oophorectomy on 02/17/2012. For a large benign  10 cm cystadenofibroma  She presented to the [facility] on 05/11/2019 for lower abdominal discomfort. She noted that she felt like she was ovulating and like she has lower abdominal swelling. The symptoms had been present for several weeks following a cystoscopy. Abdomen and Pelvis CT revealed an anterior left pelvic fluid density structure measures 2.7 x 2.4 cm, including on 75/2. This is positioned in the same region as the left ovary on the 2013 exam. Possible mild complexity within. A review of the op note from 02/17/2012 indicates that I took out both ovaries.  Path report list bilateral cystadenofibromas.  A review of the CT from late 2013 shows the large cystic pelvic mass, and a smaller cystic area approximately the size of the cystic area listed above.  I do not have a clear answer for the inconsistency in the 2 reports. ROS:  ROS CLINICAL DATA:  Lower abdominal pain. Constipation. Recent urologic procedure. Urinary tract infection at PCP yesterday, started on antibiotics. Hysterectomy.  EXAM: CT ABDOMEN AND PELVIS WITH CONTRAST  TECHNIQUE: Multidetector CT imaging of the abdomen and pelvis was performed using the standard protocol following bolus administration of intravenous contrast.  CONTRAST:  170mL OMNIPAQUE IOHEXOL 300 MG/ML  SOLN  COMPARISON:  01/11/2012  FINDINGS: Lower chest: Clear lung bases. Mild cardiomegaly, without pericardial or pleural effusion.  Hepatobiliary: Too small to characterize high right hepatic lobe lesion is similar in 2013 and can be presumed benign.  Stone in the  gallbladder neck of 1.4 cm. No acute cholecystitis or biliary duct dilatation.  Pancreas: Normal, without mass or ductal dilatation.  Spleen: Normal in size, without focal abnormality.  Adrenals/Urinary Tract: Normal right adrenal gland. Minimal left adrenal nodularity is unchanged. At least 1 upper pole right renal tiny, 3-4 mm fat density lesion(s) consistent with angiomyolipoma. Normal left kidney, without hydronephrosis. No hydronephrosis.  Stomach/Bowel: Normal stomach, without wall thickening. Scattered colonic diverticula. Colonic stool burden suggests constipation. Normal terminal ileum and appendix. Normal small bowel.  Vascular/Lymphatic: Aortic and branch vessel atherosclerosis. No abdominopelvic adenopathy.  Reproductive: Hysterectomy. An anterior left pelvic fluid density structure measures 2.7 x 2.4 cm, including on 75/2. This is positioned in the same region as the left ovary on the 2013 exam. Possible mild complexity within. Example coronal image 43. No residual right ovarian/adnexal lesion.  Other: No significant free fluid. Bilateral tiny fat containing inguinal hernias.  Musculoskeletal: L3-5 trans pedicle screw fixation.  IMPRESSION: 1. Possible constipation. 2. No other explanation for patient's symptoms. 3. Cholelithiasis. 4. Aortic Atherosclerosis (ICD10-I70.0). 5. Left pelvic lesion may arise from the left ovary/adnexa. Per consensus criteria, given patient age and suggestion of possible complexity, further evaluation with pelvic ultrasound is recommended. This recommendation follows ACR consensus guidelines: White Paper of the ACR Incidental Findings Committee II on Adnexal Findings. J Am Coll Radiol 2013:10:675-681. 6. One definite and probable second tiny upper pole right renal angiomyolipoma. This appearance can be seen with tuberous sclerosis.   Electronically Signed   By: Abigail Miyamoto M.D.   On: 05/11/2019 14:20   Pertinent  History Reviewed:  Reviewed: Significant for  Medical         Past Medical History:  Diagnosis Date  . Anxiety   . Arthritis    hands, feet  . Back pain    chronic  . Cataracts, bilateral    MD just watching right now  . Cystitides, interstitial, chronic   . Depression   . Fibromyalgia   . GERD (gastroesophageal reflux disease)   . Gout    feet  . High cholesterol   . Hypertension   . Shortness of breath    with exertion  . Sleep apnea    Uses appliance; used CPAP   . SVD (spontaneous vaginal delivery)    x 3  . Urinary disorder    bladder is in sling per pt  . Urinary tract infection   . Vertigo   . Wears glasses                               Surgical Hx:    Past Surgical History:  Procedure Laterality Date  . ABDOMINAL HYSTERECTOMY  1997   [facility] , [place] LAT LUMBAR FUSION Right 04/28/2018   Procedure: ANTERIOR LATERAL INNER BODY LUMBAR FUSION L3-4, L4-5 2 LEVELS WITH INSTRUMENTATION AND ALLOGRAFT;  Surgeon: Phylliss Bob, MD;  Location: [facility];  Service: Orthopedics;  Laterality: Right;  . BACK SURGERY  10/20/2017  . bladder tack    . COLONOSCOPY N/A 08/23/2014   Procedure: COLONOSCOPY;  Surgeon: Rogene Houston, MD;  [facility];  Service: Endoscopy;  Laterality: N/A;  1200  . CYSTO WITH HYDRODISTENSION N/A 04/18/2019   Procedure: CYSTOSCOPY/HYDRODISTENSION;  Surgeon: Cleon Gustin, MD;  [facility];  Service: Urology;  Laterality: N/A;  . KNEE SURGERY Left 05/26/14   meniscus tear  . LAPAROSCOPIC SALPINGO OOPHERECTOMY  02/17/2012   Procedure: LAPAROSCOPIC SALPINGO OOPHORECTOMY;  Surgeon: Jonnie Kind, MD;  Location[facility];  Service: Gynecology;  Laterality: Bilateral;  . LASIK Bilateral   . TOE SURGERY    . UPPER GI ENDOSCOPY     Medications: Reviewed & Updated - see associated section                       Current Outpatient Medications:  .  ALPRAZolam (XANAX) 0.5 MG tablet, Take 0.5 mg by mouth 2 (two) times daily. ,  Disp: , Rfl: 5 .  amoxicillin-clavulanate (AUGMENTIN) 875-125 MG tablet, Take 1 tablet by mouth every 12 (twelve) hours. (Patient not taking: Reported on 05/18/2019), Disp: 14 tablet, Rfl: 0 .  CAMPHOR-MENTHOL EX, Apply 1 application topically 3 (three) times daily as needed (for pain). , Disp: , Rfl:  .  Cyanocobalamin (VITAMIN B-12 PO), Take 1 tablet by mouth daily., Disp: , Rfl:  .  DULoxetine (CYMBALTA) 60 MG capsule, Take 1 capsule (60 mg total) by mouth 2 (two) times daily., Disp: 60 capsule, Rfl: 2 .  fluticasone (FLONASE) 50 MCG/ACT nasal spray, Place 1 spray into both nostrils daily as needed for allergies. , Disp: , Rfl:  .  HYDROcodone-acetaminophen (NORCO) 5-325 MG tablet, Take 1 tablet by mouth every 4 (four) hours as needed for moderate pain., Disp: 30 tablet, Rfl: 0 .  lisinopril-hydrochlorothiazide (PRINZIDE,ZESTORETIC) 20-12.5 MG per tablet, Take 1 tablet by mouth 2 (two) times daily., Disp: , Rfl:  .  Melatonin 10 MG TABS, Take 10 mg by mouth at bedtime as  needed (sleep). , Disp: , Rfl:  .  Menthol, Topical Analgesic, (MUSCLE & JOINT EX), Apply 1 application topically 3 (three) times daily as needed (pain). , Disp: , Rfl:  .  metoprolol (LOPRESSOR) 100 MG tablet, Take 100 mg by mouth 2 (two) times daily. , Disp: , Rfl:  .  Naphazoline-Pheniramine (OPCON-A OP), Place 1 drop into both eyes 3 (three) times daily as needed (dry/irritated eyes.)., Disp: , Rfl:  .  nitrofurantoin, macrocrystal-monohydrate, (MACROBID) 100 MG capsule, Take 100 mg by mouth 2 (two) times daily., Disp: , Rfl:  .  OVER THE COUNTER MEDICATION, Take 2 tablets by mouth daily. GOLI Gummies, Disp: , Rfl:  .  polyethylene glycol (MIRALAX) 17 g packet, Take 17 g by mouth daily. Dissolve one cap full in solution (water, gatorade, etc.) and administer once cap-full daily. You may titrate up daily by 1 cap-full until the patient is having pudding consistency of stools. After the patient is able to start passing softer stools  they they can continue to take 1/2 cap-full daily for 2 weeks., Disp: 14 each, Rfl: 0 .  Probiotic Product (PROBIOTIC DAILY PO), Take 1 capsule by mouth daily. , Disp: , Rfl:  .  simvastatin (ZOCOR) 20 MG tablet, Take 10 mg by mouth at bedtime. , Disp: , Rfl:  .  sulfamethoxazole-trimethoprim (BACTRIM DS) 800-160 MG tablet, Take 1 tablet by mouth every 12 (twelve) hours. (Patient not taking: Reported on 05/18/2019), Disp: 14 tablet, Rfl: 0 .  VITAMIN D PO, Take 1 tablet by mouth daily., Disp: , Rfl:    Social History: Reviewed -  reports that she has never smoked. She has never used smokeless tobacco.  Objective Findings:  Vitals: There were no vitals taken for this visit.  PHYSICAL EXAMINATION General appearance - alert, well appearing, and in no distress, oriented to person, place, and time, overweight and well hydrated Mental status - alert, oriented to person, place, and time, normal mood, behavior, speech, dress, motor activity, and thought processes, affect appropriate to mood Chest - not examined Heart - not examined Abdomen -small Lipoma noted 6-8 cm above the navel,  Breasts - not examined Skin - normal coloration and turgor, no rashes, no suspicious skin lesions noted  PELVIC External genitalia - normal  Vulva - normal for age Vagina - normal  Cervix - surgically absent} Uterus - surgically absent} Adnexa - no masses  [place] - n/a Rectal - rectal exam not indicated  Assessment & Plan:   A:  1.  Small cystic area in pelvis that might represent a remnant of the cystadenofibromas removed in 2014. 2. Will ask pt to allow [place] to do a TA u/s pelvis to gain additional information.  P:  1.  pt aware that pelvic u/s ordered   By signing my name below, I, [employer], attest that this documentation has been prepared under the direction and in the presence of Jonnie Kind, MD. Electronically Signed: [place]. Duarte. 8:57 AM.  I personally performed  the services described in this documentation, which was SCRIBED in my presence. The recorded information has been reviewed and considered accurate. It has been edited as necessary during review. Jonnie Kind, MD

## 2019-05-29 ENCOUNTER — Telehealth: Payer: Self-pay | Admitting: Obstetrics and Gynecology

## 2019-05-29 ENCOUNTER — Other Ambulatory Visit: Payer: Self-pay | Admitting: Obstetrics and Gynecology

## 2019-05-29 DIAGNOSIS — N83209 Unspecified ovarian cyst, unspecified side: Secondary | ICD-10-CM

## 2019-05-29 DIAGNOSIS — R1900 Intra-abdominal and pelvic swelling, mass and lump, unspecified site: Secondary | ICD-10-CM

## 2019-05-29 NOTE — Telephone Encounter (Signed)
Pt made aware of plans to order u/s to further delineate the pelvic cyst.

## 2019-06-07 ENCOUNTER — Other Ambulatory Visit: Payer: Self-pay | Admitting: Obstetrics and Gynecology

## 2019-06-07 DIAGNOSIS — R1900 Intra-abdominal and pelvic swelling, mass and lump, unspecified site: Secondary | ICD-10-CM

## 2019-06-08 ENCOUNTER — Other Ambulatory Visit: Payer: Self-pay

## 2019-06-08 ENCOUNTER — Ambulatory Visit (INDEPENDENT_AMBULATORY_CARE_PROVIDER_SITE_OTHER): Payer: Medicare Other

## 2019-06-08 DIAGNOSIS — R1900 Intra-abdominal and pelvic swelling, mass and lump, unspecified site: Secondary | ICD-10-CM | POA: Diagnosis not present

## 2019-06-08 NOTE — Progress Notes (Signed)
PELVIC US TA/TV:normal vaginal cuff,right adnexa WNL, left adnexal cyst with a thin septation 3.6 x 2 x 2.4 cm,cyst appears to be superior and left of bladder,unable to visualize on transvaginal images,no color flow,no free fluid,no pain during ultrasound  Chaperone Peggy

## 2019-06-17 ENCOUNTER — Ambulatory Visit (INDEPENDENT_AMBULATORY_CARE_PROVIDER_SITE_OTHER): Payer: Medicare Other | Admitting: Obstetrics and Gynecology

## 2019-06-17 ENCOUNTER — Encounter: Payer: Self-pay | Admitting: Obstetrics and Gynecology

## 2019-06-17 VITALS — BP 152/84 | HR 62 | Ht 65.5 in | Wt 203.6 lb

## 2019-06-17 DIAGNOSIS — Z780 Asymptomatic menopausal state: Secondary | ICD-10-CM

## 2019-06-17 DIAGNOSIS — N9489 Other specified conditions associated with female genital organs and menstrual cycle: Secondary | ICD-10-CM

## 2019-06-17 DIAGNOSIS — R102 Pelvic and perineal pain: Secondary | ICD-10-CM

## 2019-06-17 DIAGNOSIS — G8929 Other chronic pain: Secondary | ICD-10-CM

## 2019-06-17 NOTE — Progress Notes (Signed)
Patient ID: [name], female   DOB: [birth year], 69 y.o.   MRN: [id number]    [facility] Visit  @DATE @            Patient name: [name] MRN [id number]  Date of birth: 1950-07-05  CC & HPI:  [name] is a 69 y.o. female presenting today to discuss the results of her ultrasound on 06/08/2019. Her CT on 05/11/2019 revealed a cystic mass on the anterior pelvis, which prompted further examination via ultrasound. She reports that she has a heavy feeling and pain across the center of her pelvis. She takes Ibuprofen in the mornings, which helps. She has not been moving around much because of the pain and fatigue. She lost 30 lbs after back surgery, but has gained 10 back. The patient is not planning on receiving her COVID-19 vaccine. She mentions her faith in God as a part of her choice not to immunize. She has concerns that the vaccine contains dangerous chemicals. The patient denies fever, chills or any other symptoms or complaints at this time.    The ultrasound is reviewed. The patient has a simple cyst just above the bladder on the patient left. This is measured in the u/s report at approx 3 cm maximum diameter. It is in contact with the bladder dome. There are no identified communication with the bladder. It is without solid components , septations or other types of concern for malignancy. Review of the pathology report from the bilateral oophorectomy indicates both ovaries as having been removed and two cystadenomas identified.  The patient describes her pain as being diffuse over the entire lower abdomen, and is specifically not located as a focal pain,  And not in the area of the LLQ that would correspond to the location of the cyst. There is no noted ascites.  ROS:  ROS  + pelvic pain + fatigue - fever - chills All systems are negative except as noted in the HPI and PMH.   Pertinent History Reviewed:   Reviewed: Medical         Past Medical History:   Diagnosis Date   Anxiety    Arthritis    hands, feet   Back pain    chronic   Cataracts, bilateral    MD just watching right now   Cystitides, interstitial, chronic    Depression    Fibromyalgia    GERD (gastroesophageal reflux disease)    Gout    feet   High cholesterol    Hypertension    Shortness of breath    with exertion   Sleep apnea    Uses appliance; used CPAP    SVD (spontaneous vaginal delivery)    x 3   Urinary disorder    bladder is in sling per pt   Urinary tract infection    Vertigo    Wears glasses                               Surgical Hx:    Past Surgical History:  Procedure Laterality Date   ABDOMINAL HYSTERECTOMY  1997   [place] [facility]   ANTERIOR LAT LUMBAR FUSION Right 04/28/2018   Procedure: ANTERIOR LATERAL INNER BODY LUMBAR FUSION L3-4, L4-5 2 LEVELS WITH INSTRUMENTATION AND ALLOGRAFT;  Surgeon: Phylliss Bob, MD;  Location: [facility];  Service: Orthopedics;  Laterality: Right;   BACK SURGERY  10/20/2017   bladder tack  COLONOSCOPY N/A 08/23/2014   Procedure: COLONOSCOPY;  Surgeon: Rogene Houston, MD;  [facility];  Service: Endoscopy;  Laterality: N/A;  1200   CYSTO WITH HYDRODISTENSION N/A 04/18/2019   Procedure: CYSTOSCOPY/HYDRODISTENSION;  Surgeon: Cleon Gustin, MD;  [facility];  Service: Urology;  Laterality: N/A;   KNEE SURGERY Left 05/26/14   meniscus tear   LAPAROSCOPIC SALPINGO OOPHERECTOMY  02/17/2012   Procedure: LAPAROSCOPIC SALPINGO OOPHORECTOMY;  Surgeon: Jonnie Kind, MD;  [facility];  Service: Gynecology;  Laterality: Bilateral;   LASIK Bilateral    TOE SURGERY     UPPER GI ENDOSCOPY     Medications: Reviewed & Updated - see associated section                       Current Outpatient Medications:    ALPRAZolam (XANAX) 0.5 MG tablet, Take 0.5 mg by mouth 2 (two) times daily. , Disp: , Rfl: 5   CAMPHOR-MENTHOL EX, Apply 1 application topically 3 (three) times  daily as needed (for pain). , Disp: , Rfl:    Cyanocobalamin (VITAMIN B-12 PO), Take 1 tablet by mouth daily., Disp: , Rfl:    DULoxetine (CYMBALTA) 60 MG capsule, Take 1 capsule (60 mg total) by mouth 2 (two) times daily., Disp: 60 capsule, Rfl: 2   fluticasone (FLONASE) 50 MCG/ACT nasal spray, Place 1 spray into both nostrils daily as needed for allergies. , Disp: , Rfl:    lisinopril-hydrochlorothiazide (PRINZIDE,ZESTORETIC) 20-12.5 MG per tablet, Take 1 tablet by mouth 2 (two) times daily., Disp: , Rfl:    Menthol, Topical Analgesic, (MUSCLE & JOINT EX), Apply 1 application topically 3 (three) times daily as needed (pain). , Disp: , Rfl:    metoprolol (LOPRESSOR) 100 MG tablet, Take 100 mg by mouth 2 (two) times daily. , Disp: , Rfl:    Naphazoline-Pheniramine (OPCON-A OP), Place 1 drop into both eyes 3 (three) times daily as needed (dry/irritated eyes.)., Disp: , Rfl:    Omega-3 Fatty Acids (FISH OIL) 1000 MG CAPS, Take by mouth., Disp: , Rfl:    OVER THE COUNTER MEDICATION, Take 2 tablets by mouth daily. GOLI Gummies, Disp: , Rfl:    polyethylene glycol (MIRALAX) 17 g packet, Take 17 g by mouth daily. Dissolve one cap full in solution (water, gatorade, etc.) and administer once cap-full daily. You may titrate up daily by 1 cap-full until the patient is having pudding consistency of stools. After the patient is able to start passing softer stools they they can continue to take 1/2 cap-full daily for 2 weeks., Disp: 14 each, Rfl: 0   Probiotic Product (PROBIOTIC DAILY PO), Take 1 capsule by mouth daily. , Disp: , Rfl:    simvastatin (ZOCOR) 20 MG tablet, Take 10 mg by mouth at bedtime. , Disp: , Rfl:    VITAMIN D PO, Take 1 tablet by mouth daily., Disp: , Rfl:    Social History: Reviewed -  reports that she has never smoked. She has never used smokeless tobacco.  Objective Findings:  Vitals: Blood pressure (!) 152/84, pulse 62, height 5' 5.5" (1.664 m), weight 203 lb 9.6 oz (92.4  kg).  PHYSICAL EXAMINATION General appearance - alert, well appearing, and in no distress, oriented to person, place, and time and overweight Mental status - alert, oriented to person, place, and time, normal mood, behavior, speech, dress, motor activity, and thought processes, affect appropriate to mood  PELVIC DEFERRED  Assessment & Plan:   A:  1. Pelvic pain diffuse chronic 2. Reviewed [place] images with the patient 3. Benign appearing simple 3 cm cyst on left anterior pelvis, felt likely not the cause of her pain  P:  1. Pt will call if she decides that she would like the cyst removed 2. Offered Follow up in 6 months for a repeat US to confirm continued stability  By signing my name below, I, De Burrs, attest that this documentation has been prepared under the direction and in the presence of Jonnie Kind, MD. Electronically Signed: De Burrs, Medical Scribe. 06/17/19. 9:33 AM.  I personally performed the services described in this documentation, which was SCRIBED in my presence. The recorded information has been reviewed and considered accurate. It has been edited as necessary during review. Jonnie Kind, MD

## 2019-06-20 DIAGNOSIS — N958 Other specified menopausal and perimenopausal disorders: Secondary | ICD-10-CM | POA: Insufficient documentation

## 2019-06-23 ENCOUNTER — Ambulatory Visit: Payer: Medicare Other | Admitting: Physical Therapy

## 2019-06-29 ENCOUNTER — Ambulatory Visit: Payer: Medicare Other | Admitting: Urology

## 2019-06-30 ENCOUNTER — Ambulatory Visit: Payer: Medicare Other | Admitting: Physical Therapy

## 2019-07-02 DIAGNOSIS — N958 Other specified menopausal and perimenopausal disorders: Secondary | ICD-10-CM | POA: Insufficient documentation

## 2019-07-28 DIAGNOSIS — N83202 Unspecified ovarian cyst, left side: Secondary | ICD-10-CM | POA: Diagnosis not present

## 2019-08-10 ENCOUNTER — Emergency Department (HOSPITAL_COMMUNITY)
Admission: EM | Admit: 2019-08-10 | Discharge: 2019-08-10 | Discharge status: Against Medical Advice | Payer: Medicare Other

## 2019-08-10 ENCOUNTER — Other Ambulatory Visit: Payer: Self-pay

## 2019-08-10 DIAGNOSIS — S52502A Unspecified fracture of the lower end of left radius, initial encounter for closed fracture: Secondary | ICD-10-CM | POA: Diagnosis not present

## 2019-08-10 DIAGNOSIS — W1839XA Other fall on same level, initial encounter: Secondary | ICD-10-CM | POA: Diagnosis not present

## 2019-08-10 DIAGNOSIS — S52592D Other fractures of lower end of left radius, subsequent encounter for closed fracture with routine healing: Secondary | ICD-10-CM | POA: Diagnosis not present

## 2019-08-10 DIAGNOSIS — S52592A Other fractures of lower end of left radius, initial encounter for closed fracture: Secondary | ICD-10-CM | POA: Diagnosis not present

## 2019-08-10 DIAGNOSIS — S6292XA Unspecified fracture of left wrist and hand, initial encounter for closed fracture: Secondary | ICD-10-CM | POA: Diagnosis not present

## 2019-08-15 DIAGNOSIS — E785 Hyperlipidemia, unspecified: Secondary | ICD-10-CM | POA: Insufficient documentation

## 2019-08-15 DIAGNOSIS — S52502A Unspecified fracture of the lower end of left radius, initial encounter for closed fracture: Secondary | ICD-10-CM | POA: Diagnosis not present

## 2019-08-15 DIAGNOSIS — D4959 Neoplasm of unspecified behavior of other genitourinary organ: Secondary | ICD-10-CM | POA: Insufficient documentation

## 2019-08-15 DIAGNOSIS — S52602A Unspecified fracture of lower end of left ulna, initial encounter for closed fracture: Secondary | ICD-10-CM | POA: Diagnosis not present

## 2019-08-16 DIAGNOSIS — I10 Essential (primary) hypertension: Secondary | ICD-10-CM | POA: Diagnosis not present

## 2019-08-16 DIAGNOSIS — Z01818 Encounter for other preprocedural examination: Secondary | ICD-10-CM | POA: Diagnosis not present

## 2019-08-16 DIAGNOSIS — Z419 Encounter for procedure for purposes other than remedying health state, unspecified: Secondary | ICD-10-CM | POA: Diagnosis not present

## 2019-08-18 DIAGNOSIS — K219 Gastro-esophageal reflux disease without esophagitis: Secondary | ICD-10-CM | POA: Diagnosis not present

## 2019-08-18 DIAGNOSIS — F329 Major depressive disorder, single episode, unspecified: Secondary | ICD-10-CM | POA: Diagnosis not present

## 2019-08-18 DIAGNOSIS — S52562A Barton's fracture of left radius, initial encounter for closed fracture: Secondary | ICD-10-CM | POA: Diagnosis not present

## 2019-08-18 DIAGNOSIS — E785 Hyperlipidemia, unspecified: Secondary | ICD-10-CM | POA: Diagnosis not present

## 2019-08-18 DIAGNOSIS — S52502A Unspecified fracture of the lower end of left radius, initial encounter for closed fracture: Secondary | ICD-10-CM | POA: Diagnosis not present

## 2019-08-18 DIAGNOSIS — G4730 Sleep apnea, unspecified: Secondary | ICD-10-CM | POA: Diagnosis not present

## 2019-08-18 DIAGNOSIS — I10 Essential (primary) hypertension: Secondary | ICD-10-CM | POA: Diagnosis not present

## 2019-08-18 DIAGNOSIS — S52602A Unspecified fracture of lower end of left ulna, initial encounter for closed fracture: Secondary | ICD-10-CM | POA: Diagnosis not present

## 2019-08-18 DIAGNOSIS — S52502D Unspecified fracture of the lower end of left radius, subsequent encounter for closed fracture with routine healing: Secondary | ICD-10-CM | POA: Diagnosis not present

## 2019-08-18 DIAGNOSIS — M797 Fibromyalgia: Secondary | ICD-10-CM | POA: Diagnosis not present

## 2019-08-19 ENCOUNTER — Ambulatory Visit: Payer: Medicare Other | Admitting: Physical Therapy

## 2019-08-23 ENCOUNTER — Ambulatory Visit: Payer: Medicare Other | Admitting: Physical Therapy

## 2019-08-29 DIAGNOSIS — S52602D Unspecified fracture of lower end of left ulna, subsequent encounter for closed fracture with routine healing: Secondary | ICD-10-CM | POA: Diagnosis not present

## 2019-08-29 DIAGNOSIS — S52502D Unspecified fracture of the lower end of left radius, subsequent encounter for closed fracture with routine healing: Secondary | ICD-10-CM | POA: Diagnosis not present

## 2019-08-30 ENCOUNTER — Ambulatory Visit: Payer: Medicare Other | Admitting: Physical Therapy

## 2019-09-06 ENCOUNTER — Encounter: Payer: Medicare Other | Admitting: Physical Therapy

## 2019-09-13 ENCOUNTER — Encounter: Payer: Medicare Other | Admitting: Physical Therapy

## 2019-09-26 DIAGNOSIS — S52502D Unspecified fracture of the lower end of left radius, subsequent encounter for closed fracture with routine healing: Secondary | ICD-10-CM | POA: Diagnosis not present

## 2019-09-26 DIAGNOSIS — S52602D Unspecified fracture of lower end of left ulna, subsequent encounter for closed fracture with routine healing: Secondary | ICD-10-CM | POA: Diagnosis not present

## 2019-10-13 DIAGNOSIS — G629 Polyneuropathy, unspecified: Secondary | ICD-10-CM | POA: Diagnosis not present

## 2019-10-13 DIAGNOSIS — Z6833 Body mass index (BMI) 33.0-33.9, adult: Secondary | ICD-10-CM | POA: Diagnosis not present

## 2019-10-13 DIAGNOSIS — R5383 Other fatigue: Secondary | ICD-10-CM | POA: Diagnosis not present

## 2019-10-13 DIAGNOSIS — E559 Vitamin D deficiency, unspecified: Secondary | ICD-10-CM | POA: Diagnosis not present

## 2019-10-13 DIAGNOSIS — M797 Fibromyalgia: Secondary | ICD-10-CM | POA: Diagnosis not present

## 2019-10-13 DIAGNOSIS — F339 Major depressive disorder, recurrent, unspecified: Secondary | ICD-10-CM | POA: Diagnosis not present

## 2019-10-13 DIAGNOSIS — I10 Essential (primary) hypertension: Secondary | ICD-10-CM | POA: Diagnosis not present

## 2019-10-13 DIAGNOSIS — N3010 Interstitial cystitis (chronic) without hematuria: Secondary | ICD-10-CM | POA: Diagnosis not present

## 2019-10-31 DIAGNOSIS — S52602D Unspecified fracture of lower end of left ulna, subsequent encounter for closed fracture with routine healing: Secondary | ICD-10-CM | POA: Diagnosis not present

## 2019-10-31 DIAGNOSIS — S52502D Unspecified fracture of the lower end of left radius, subsequent encounter for closed fracture with routine healing: Secondary | ICD-10-CM | POA: Diagnosis not present

## 2019-11-03 DIAGNOSIS — M7040 Prepatellar bursitis, unspecified knee: Secondary | ICD-10-CM | POA: Diagnosis not present

## 2019-11-03 DIAGNOSIS — M47816 Spondylosis without myelopathy or radiculopathy, lumbar region: Secondary | ICD-10-CM | POA: Diagnosis not present

## 2019-11-03 DIAGNOSIS — M9903 Segmental and somatic dysfunction of lumbar region: Secondary | ICD-10-CM | POA: Diagnosis not present

## 2019-11-04 DIAGNOSIS — M47816 Spondylosis without myelopathy or radiculopathy, lumbar region: Secondary | ICD-10-CM | POA: Diagnosis not present

## 2019-11-04 DIAGNOSIS — M9903 Segmental and somatic dysfunction of lumbar region: Secondary | ICD-10-CM | POA: Diagnosis not present

## 2019-11-04 DIAGNOSIS — M7040 Prepatellar bursitis, unspecified knee: Secondary | ICD-10-CM | POA: Diagnosis not present

## 2019-11-07 DIAGNOSIS — M7040 Prepatellar bursitis, unspecified knee: Secondary | ICD-10-CM | POA: Diagnosis not present

## 2019-11-07 DIAGNOSIS — M47816 Spondylosis without myelopathy or radiculopathy, lumbar region: Secondary | ICD-10-CM | POA: Diagnosis not present

## 2019-11-07 DIAGNOSIS — M9903 Segmental and somatic dysfunction of lumbar region: Secondary | ICD-10-CM | POA: Diagnosis not present

## 2019-11-08 DIAGNOSIS — R9431 Abnormal electrocardiogram [ECG] [EKG]: Secondary | ICD-10-CM | POA: Diagnosis not present

## 2019-11-08 DIAGNOSIS — R079 Chest pain, unspecified: Secondary | ICD-10-CM | POA: Diagnosis not present

## 2019-11-08 DIAGNOSIS — R011 Cardiac murmur, unspecified: Secondary | ICD-10-CM | POA: Diagnosis not present

## 2019-11-08 DIAGNOSIS — Z8271 Family history of polycystic kidney: Secondary | ICD-10-CM | POA: Diagnosis not present

## 2019-11-08 DIAGNOSIS — R5383 Other fatigue: Secondary | ICD-10-CM | POA: Diagnosis not present

## 2019-11-08 DIAGNOSIS — R931 Abnormal findings on diagnostic imaging of heart and coronary circulation: Secondary | ICD-10-CM | POA: Diagnosis not present

## 2019-11-08 DIAGNOSIS — R0602 Shortness of breath: Secondary | ICD-10-CM | POA: Diagnosis not present

## 2019-11-08 DIAGNOSIS — I509 Heart failure, unspecified: Secondary | ICD-10-CM | POA: Diagnosis not present

## 2019-11-08 DIAGNOSIS — I110 Hypertensive heart disease with heart failure: Secondary | ICD-10-CM | POA: Diagnosis not present

## 2019-11-09 DIAGNOSIS — R9439 Abnormal result of other cardiovascular function study: Secondary | ICD-10-CM | POA: Insufficient documentation

## 2019-11-10 DIAGNOSIS — M7040 Prepatellar bursitis, unspecified knee: Secondary | ICD-10-CM | POA: Diagnosis not present

## 2019-11-10 DIAGNOSIS — M47816 Spondylosis without myelopathy or radiculopathy, lumbar region: Secondary | ICD-10-CM | POA: Diagnosis not present

## 2019-11-10 DIAGNOSIS — M9903 Segmental and somatic dysfunction of lumbar region: Secondary | ICD-10-CM | POA: Diagnosis not present

## 2019-11-11 DIAGNOSIS — N3010 Interstitial cystitis (chronic) without hematuria: Secondary | ICD-10-CM | POA: Diagnosis not present

## 2019-11-11 DIAGNOSIS — R5383 Other fatigue: Secondary | ICD-10-CM | POA: Diagnosis not present

## 2019-11-11 DIAGNOSIS — Z6834 Body mass index (BMI) 34.0-34.9, adult: Secondary | ICD-10-CM | POA: Diagnosis not present

## 2019-11-11 DIAGNOSIS — F339 Major depressive disorder, recurrent, unspecified: Secondary | ICD-10-CM | POA: Diagnosis not present

## 2019-11-11 DIAGNOSIS — E559 Vitamin D deficiency, unspecified: Secondary | ICD-10-CM | POA: Diagnosis not present

## 2019-11-11 DIAGNOSIS — I10 Essential (primary) hypertension: Secondary | ICD-10-CM | POA: Diagnosis not present

## 2019-11-11 DIAGNOSIS — Z0001 Encounter for general adult medical examination with abnormal findings: Secondary | ICD-10-CM | POA: Diagnosis not present

## 2019-11-11 DIAGNOSIS — R9439 Abnormal result of other cardiovascular function study: Secondary | ICD-10-CM | POA: Diagnosis not present

## 2019-11-12 DIAGNOSIS — E7849 Other hyperlipidemia: Secondary | ICD-10-CM | POA: Diagnosis not present

## 2019-11-12 DIAGNOSIS — M1712 Unilateral primary osteoarthritis, left knee: Secondary | ICD-10-CM | POA: Diagnosis not present

## 2019-11-12 DIAGNOSIS — M5116 Intervertebral disc disorders with radiculopathy, lumbar region: Secondary | ICD-10-CM | POA: Diagnosis not present

## 2019-11-12 DIAGNOSIS — I10 Essential (primary) hypertension: Secondary | ICD-10-CM | POA: Diagnosis not present

## 2019-11-17 ENCOUNTER — Other Ambulatory Visit: Payer: Self-pay

## 2019-11-17 ENCOUNTER — Encounter: Payer: Self-pay | Admitting: Cardiology

## 2019-11-17 ENCOUNTER — Ambulatory Visit (INDEPENDENT_AMBULATORY_CARE_PROVIDER_SITE_OTHER): Payer: Medicare Other | Admitting: Cardiology

## 2019-11-17 VITALS — BP 132/100 | HR 60 | Ht 65.5 in | Wt 205.6 lb

## 2019-11-17 DIAGNOSIS — R0602 Shortness of breath: Secondary | ICD-10-CM | POA: Diagnosis not present

## 2019-11-17 DIAGNOSIS — I10 Essential (primary) hypertension: Secondary | ICD-10-CM

## 2019-11-17 DIAGNOSIS — E7800 Pure hypercholesterolemia, unspecified: Secondary | ICD-10-CM | POA: Diagnosis not present

## 2019-11-17 DIAGNOSIS — R079 Chest pain, unspecified: Secondary | ICD-10-CM

## 2019-11-17 DIAGNOSIS — R072 Precordial pain: Secondary | ICD-10-CM | POA: Diagnosis not present

## 2019-11-17 DIAGNOSIS — R5383 Other fatigue: Secondary | ICD-10-CM | POA: Diagnosis not present

## 2019-11-17 NOTE — Progress Notes (Signed)
Cardiology Consult Note    Date:  11/17/2019   ID:  [name], DOB 04-14-1950, MRN [id number]  PCP:  Curlene Labrum, MD  Cardiologist:  Fransico Him, MD   Chief Complaint  Patient presents with  . New Patient (Initial Visit)    chest pain with abnormal stress test    History of Present Illness:  [name] is a 69 y.o. female who is being seen today for the evaluation of abnormal stress test done for chest pain at the request of Burdine, Virgina Evener, MD.  This is a 69yo female with a hx of anxiety, depression, fibromyalgia, HTN, HLD who recently complained of extreme fatigue for the past 8 months to her PCP and nuclear stress testing was ordered which was high risk with a large region of moderate reversibility in the anterior wall, anteroseptum and anterolateral walls concerning for anterior ischemia.  LVF was normal with mild AVSC, mild LAR, trivial MR and G1DD.  She is now referred for further cardiac evaluation.  She has never smoked and has no fm hx of premature CAD .  She tells me that her main symptom is extreme fatigue and can do very little and gets exhausted and has to lay down.  She has had some sharp stabbing pains from time to time but not really a major problem.  She has had some DOE when going up stairs.  She has a hx of asthma as a child.  She says that very little activity will make her SOB.  She will notice her heart speed up if she has had too much caffeine.  She denies any LE edema, PND, orthopnea or syncope.  She dose have problems with vertigo with head movements too fast.   Past Medical History:  Diagnosis Date  . Anxiety   . Arthritis    hands, feet  . Back pain    chronic  . Cataracts, bilateral    MD just watching right now  . Cystitides, interstitial, chronic   . Depression   . Fibromyalgia   . GERD (gastroesophageal reflux disease)   . Gout    feet  . High cholesterol   . Hypertension   . Shortness of breath    with exertion  . Sleep  apnea    Uses appliance; used CPAP   . SVD (spontaneous vaginal delivery)    x 3  . Urinary disorder    bladder is in sling per pt  . Urinary tract infection   . Vertigo   . Wears glasses     Past Surgical History:  Procedure Laterality Date  . ABDOMINAL HYSTERECTOMY  1997   [place] , [place] LAT LUMBAR FUSION Right 04/28/2018   Procedure: ANTERIOR LATERAL INNER BODY LUMBAR FUSION L3-4, L4-5 2 LEVELS WITH INSTRUMENTATION AND ALLOGRAFT;  Surgeon: Phylliss Bob, MD;  Location: [facility];  Service: Orthopedics;  Laterality: Right;  . BACK SURGERY  10/20/2017  . bladder tack    . COLONOSCOPY N/A 08/23/2014   Procedure: COLONOSCOPY;  Surgeon: Rogene Houston, MD;  [facility];  Service: Endoscopy;  Laterality: N/A;  1200  . CYSTO WITH HYDRODISTENSION N/A 04/18/2019   Procedure: CYSTOSCOPY/HYDRODISTENSION;  Surgeon: Cleon Gustin, MD;  [facility];  Service: Urology;  Laterality: N/A;  . KNEE SURGERY Left 05/26/14   meniscus tear  . LAPAROSCOPIC SALPINGO OOPHERECTOMY  02/17/2012   Procedure: LAPAROSCOPIC SALPINGO OOPHORECTOMY;  Surgeon: Jonnie Kind, MD;  [facility];  Service: Gynecology;  Laterality: Bilateral;  . LASIK Bilateral   . TOE SURGERY    . UPPER GI ENDOSCOPY      Current Medications: Current Meds  Medication Sig  . ALPRAZolam (XANAX) 0.5 MG tablet Take 0.5 mg by mouth 2 (two) times daily.   Marland Kitchen CAMPHOR-MENTHOL EX Apply 1 application topically 3 (three) times daily as needed (for pain).   . Cyanocobalamin (VITAMIN B-12 PO) Take 1 tablet by mouth daily.  . DULoxetine (CYMBALTA) 60 MG capsule Take 1 capsule (60 mg total) by mouth 2 (two) times daily.  . ergocalciferol (VITAMIN D2) 1.25 MG (50000 UT) capsule Vitamin D2 1,250 mcg (50,000 unit) capsule  . fluticasone (FLONASE) 50 MCG/ACT nasal spray Place 1 spray into both nostrils daily as needed for allergies.   Marland Kitchen lisinopril-hydrochlorothiazide (PRINZIDE,ZESTORETIC) 20-12.5 MG per tablet  Take 1 tablet by mouth 2 (two) times daily.  . meclizine (ANTIVERT) 25 MG tablet TAKE 1 TABLET BY MOUTH EVERY 6 HOURS AS NEEDED FOR DIZZINESS  . Menthol, Topical Analgesic, (MUSCLE & JOINT EX) Apply 1 application topically 3 (three) times daily as needed (pain).   . metoprolol (LOPRESSOR) 100 MG tablet Take 100 mg by mouth 2 (two) times daily.   . Naphazoline-Pheniramine (OPCON-A OP) Place 1 drop into both eyes 3 (three) times daily as needed (dry/irritated eyes.).  Marland Kitchen Omega-3 Fatty Acids (FISH OIL) 1000 MG CAPS Take by mouth.  Marland Kitchen OVER THE COUNTER MEDICATION Take 2 tablets by mouth daily. GOLI Gummies  . polyethylene glycol (MIRALAX) 17 g packet Take 17 g by mouth daily. Dissolve one cap full in solution (water, gatorade, etc.) and administer once cap-full daily. You may titrate up daily by 1 cap-full until the patient is having pudding consistency of stools. After the patient is able to start passing softer stools they they can continue to take 1/2 cap-full daily for 2 weeks.  . Probiotic Product (PROBIOTIC DAILY PO) Take 1 capsule by mouth daily.   . simvastatin (ZOCOR) 20 MG tablet Take 10 mg by mouth at bedtime.   Marland Kitchen VITAMIN D PO Take 1 tablet by mouth daily.    Allergies:   Patient has no known allergies.   Social History   Socioeconomic History  . Marital status: Married    Spouse name: Jeneen Rinks  . Number of children: Not on file  . Years of education: college  . Highest education level: Not on file  Occupational History    Employer: UNEMPLOYED  Tobacco Use  . Smoking status: Never Smoker  . Smokeless tobacco: Never Used  Vaping Use  . Vaping Use: Never used  Substance and Sexual Activity  . Alcohol use: Not Currently    Alcohol/week: 0.0 standard drinks    Comment: occassional, but patient states "currently does not drink"  . Drug use: No  . Sexual activity: Yes    Birth control/protection: Surgical, Post-menopausal    Comment: hysterectomy  Other Topics Concern  . Not on file   Social History Narrative   Patient lives at home with her family.   Caffeine Use: 1 cup daily   Social Determinants of Health   Financial Resource Strain:   . Difficulty of Paying Living Expenses: Not on file  Food Insecurity:   . Worried About Charity fundraiser in the Last Year: Not on file  . Ran Out of Food in the Last Year: Not on file  Transportation Needs:   . Lack of Transportation (Medical): Not on file  . Lack of  Transportation (Non-Medical): Not on file  Physical Activity:   . Days of Exercise per Week: Not on file  . Minutes of Exercise per Session: Not on file  Stress:   . Feeling of Stress : Not on file  Social Connections:   . Frequency of Communication with Friends and Family: Not on file  . Frequency of Social Gatherings with Friends and Family: Not on file  . Attends Religious Services: Not on file  . Active Member of Clubs or Organizations: Not on file  . Attends Archivist Meetings: Not on file  . Marital Status: Not on file     Family History:  The patient's family history includes Alcohol abuse in her brother, brother, and son; Atrial fibrillation in her mother; COPD in her mother; Cancer in her father; Drug abuse in her brother and brother; Early death in her brother; Fibromyalgia in her daughter; Hypertension in her brother, father, and mother; Paranoid behavior in her son; Sleep apnea in her brother.   ROS:   Please see the history of present illness.    ROS All other systems reviewed and are negative.  No flowsheet data found.  PHYSICAL EXAM:   VS:  BP (!) 132/100   Pulse 60   Ht 5' 5.5" (1.664 m)   Wt 205 lb 9.6 oz (93.3 kg)   LMP  (LMP Unknown)   SpO2 98%   BMI 33.69 kg/m    GEN: Well nourished, well developed, in no acute distress  HEENT: normal  Neck: no JVD, carotid bruits, or masses Cardiac: RRR; no murmurs, rubs, or gallops,no edema.  Intact distal pulses bilaterally.  Respiratory:  clear to auscultation bilaterally,  normal work of breathing GI: soft, nontender, nondistended, + BS MS: no deformity or atrophy  Skin: warm and dry, no rash Neuro:  Alert and Oriented x 3, Strength and sensation are intact Psych: euthymic mood, full affect  Wt Readings from Last 3 Encounters:  11/17/19 205 lb 9.6 oz (93.3 kg)  06/17/19 203 lb 9.6 oz (92.4 kg)  05/25/19 204 lb 9.6 oz (92.8 kg)      Studies/Labs Reviewed:   EKG:  EKG is ordered today.  The ekg ordered today demonstrates NSR with nonspecific ST abnormality  Recent Labs: 05/11/2019: ALT 21; BUN 18; Creatinine, Ser 0.73; Hemoglobin 13.7; Platelets 237; Potassium 4.2; Sodium 137   Lipid Panel    Component Value Date/Time   CHOL (H) 03/06/2010 0444    214        ATP III CLASSIFICATION:  <200     mg/dL   Desirable  200-239  mg/dL   Borderline High  >=240    mg/dL   High          TRIG 167 (H) 03/06/2010 0444   HDL 44 03/06/2010 0444   CHOLHDL 4.9 03/06/2010 0444   VLDL 33 03/06/2010 0444   LDLCALC (H) 03/06/2010 0444    137        Total Cholesterol/HDL:CHD Risk Coronary Heart Disease Risk Table                     Men   Women  1/2 Average Risk   3.4   3.3  Average Risk       5.0   4.4  2 X Average Risk   9.6   7.1  3 X Average Risk  23.4   11.0        Use the calculated Patient Ratio above and  the CHD Risk Table to determine the patient's CHD Risk.        ATP III CLASSIFICATION (LDL):  <100     mg/dL   Optimal  100-129  mg/dL   Near or Above                    Optimal  130-159  mg/dL   Borderline  160-189  mg/dL   High  >190     mg/dL   Very High      CHA2DS2-VASc Score = Additional studies/ records that were reviewed today include:  Nuclear stress test and outpt OV notes from PCP    ASSESSMENT:    1. Chest pain of uncertain etiology   2. Primary hypertension   3. Pure hypercholesterolemia      PLAN:  In order of problems listed above:  1. Chest pain -recent nuclear stress test ordered by PCP was high risk with a  large region of moderate reversibility in the anterior wall, anteroseptum and anterolateral walls concerning for anterior ischemia.  LVF was normal -her CRFs include postmenopausal state, HTN and HLD -EKG is nonischemic by PCP  2.  HTN -BP borderline controlled on exam -continue Lisinopril HCT 40-25mg  daily and Lopressor 100mg  BID -will add Amlodipine 2.5mg  daily -check 48 hour BP monitor  3.  HLD -LDL was 73 in 2020 -continue simvastatin 10mg  daily -repeat FLP and ALT  4.  Extreme fatigue -she has a hx of OSA and had been on CPAP but stopped using it and now using an oral device but takes it out at night and then her husband can tell that she has apneic events. -may need repeat sleep study is cardiac workup is normal  Medication Adjustments/Labs and Tests Ordered: Current medicines are reviewed at length with the patient today.  Concerns regarding medicines are outlined above.  Medication changes, Labs and Tests ordered today are listed in the Patient Instructions below.  There are no Patient Instructions on file for this visit.   Signed, Fransico Him, MD  11/17/2019 1:57 PM    [facility] [address], [place], [place]  [address] Phone: [phone]; Fax: [phone]

## 2019-11-17 NOTE — Patient Instructions (Signed)
Medication Instructions:  Your physician recommends that you continue on your current medications as directed. Please refer to the Current Medication list given to you today.  *If you need a refill on your cardiac medications before your next appointment, please call your pharmacy*   Lab Work: BMET today  If you have labs (blood work) drawn today and your tests are completely normal, you will receive your results only by: . MyChart Message (if you have MyChart) OR . A paper copy in the mail If you have any lab test that is abnormal or we need to change your treatment, we will call you to review the results.   Testing/Procedures: Your physician recommends that you have a Coronary CT performed.   Follow-Up: At [facility], you and your health needs are our priority.  As part of our continuing mission to provide you with exceptional heart care, we have created designated Provider Care Teams.  These Care Teams include your primary Cardiologist (physician) and Advanced Practice Providers (APPs -  Physician Assistants and Nurse Practitioners) who all work together to provide you with the care you need, when you need it.  We recommend signing up for the patient portal called "MyChart".  Sign up information is provided on this After Visit Summary.  MyChart is used to connect with patients for Virtual Visits (Telemedicine).  Patients are able to view lab/test results, encounter notes, upcoming appointments, etc.  Non-urgent messages can be sent to your provider as well.   To learn more about what you can do with MyChart, go to [email or web address].    Your next appointment:   As needed  The format for your next appointment:   In Person  Provider:   You may see Traci Turner, MD or one of the following Advanced Practice Providers on your designated Care Team:    Dayna Dunn, PA-C  Michele Lenze, PA-C    Other Instructions  Your cardiac CT will be scheduled at one of the below  locations:   [facility] [address] , [place] [address] [phone]  OR  [facility] [address] [place], [place] [address] [phone]  If scheduled at [facility], please arrive at the North Tower main entrance of [facility] 30 minutes prior to test start time. Proceed to the  Radiology Department (first floor) to check-in and test prep.  If scheduled at [facility], please arrive 15 mins early for check-in and test prep.  Please follow these instructions carefully (unless otherwise directed):  On the Night Before the Test: . Be sure to Drink plenty of water. . Do not consume any caffeinated/decaffeinated beverages or chocolate 12 hours prior to your test. . Do not take any antihistamines 12 hours prior to your test.  On the Day of the Test: . Drink plenty of water. Do not drink any water within one hour of the test. . Do not eat any food 4 hours prior to the test. . You may take your regular medications prior to the test.  . Take metoprolol (Lopressor) two hours prior to test. . HOLD Furosemide/Hydrochlorothiazide morning of the test. . FEMALES- please wear underwire-free bra if available       After the Test: . Drink plenty of water. . After receiving IV contrast, you may experience a mild flushed feeling. This is normal. . On occasion, you may experience a mild rash up to 24 hours after the test. This   is not dangerous. If this occurs, you can take Benadryl 25 mg and increase your fluid intake. . If you experience trouble breathing, this can be serious. If it is severe call 911 IMMEDIATELY. If it is mild, please call our office. . If you take any of these medications: Glipizide/Metformin, Avandament, Glucavance, please do not take 48 hours after completing test unless otherwise instructed.   Once we have confirmed authorization from your  insurance company, we will call you to set up a date and time for your test. Based on how quickly your insurance processes prior authorizations requests, please allow up to 4 weeks to be contacted for scheduling your Cardiac CT appointment. Be advised that routine Cardiac CT appointments could be scheduled as many as 8 weeks after your provider has ordered it.  For non-scheduling related questions, please contact the cardiac imaging nurse navigator should you have any questions/concerns: Sara Wallace, Cardiac Imaging Nurse Navigator Merle Tai, Interim Cardiac Imaging Nurse Navigator [facility] Direct Office Dial: [phone]   For scheduling needs, including cancellations and rescheduling, please call Toni at [phone], option 3.     

## 2019-11-18 DIAGNOSIS — R42 Dizziness and giddiness: Secondary | ICD-10-CM | POA: Diagnosis not present

## 2019-11-18 DIAGNOSIS — G4730 Sleep apnea, unspecified: Secondary | ICD-10-CM | POA: Diagnosis not present

## 2019-11-18 DIAGNOSIS — E785 Hyperlipidemia, unspecified: Secondary | ICD-10-CM | POA: Diagnosis not present

## 2019-11-18 DIAGNOSIS — R002 Palpitations: Secondary | ICD-10-CM | POA: Diagnosis not present

## 2019-11-18 DIAGNOSIS — J449 Chronic obstructive pulmonary disease, unspecified: Secondary | ICD-10-CM | POA: Diagnosis not present

## 2019-11-18 DIAGNOSIS — I10 Essential (primary) hypertension: Secondary | ICD-10-CM | POA: Diagnosis not present

## 2019-11-18 DIAGNOSIS — R0602 Shortness of breath: Secondary | ICD-10-CM | POA: Diagnosis not present

## 2019-11-18 DIAGNOSIS — Z20822 Contact with and (suspected) exposure to covid-19: Secondary | ICD-10-CM | POA: Diagnosis not present

## 2019-11-18 DIAGNOSIS — M797 Fibromyalgia: Secondary | ICD-10-CM | POA: Diagnosis not present

## 2019-11-18 DIAGNOSIS — E876 Hypokalemia: Secondary | ICD-10-CM | POA: Diagnosis not present

## 2019-11-18 LAB — BASIC METABOLIC PANEL
BUN/Creatinine Ratio: 21 (ref 12–28)
BUN: 16 mg/dL (ref 8–27)
CO2: 29 mmol/L (ref 20–29)
Calcium: 9.6 mg/dL (ref 8.7–10.3)
Chloride: 100 mmol/L (ref 96–106)
Creatinine, Ser: 0.76 mg/dL (ref 0.57–1.00)
GFR calc Af Amer: 91 mL/min/{1.73_m2} (ref >59)
GFR calc non Af Amer: 79 mL/min/{1.73_m2} (ref >59)
Glucose: 96 mg/dL (ref 65–99)
Potassium: 4 mmol/L (ref 3.5–5.2)
Sodium: 141 mmol/L (ref 134–144)

## 2019-11-30 ENCOUNTER — Telehealth (HOSPITAL_COMMUNITY): Payer: Self-pay | Admitting: Emergency Medicine

## 2019-11-30 NOTE — Telephone Encounter (Signed)
Attempted to call patient regarding upcoming cardiac CT appointment. °Left message on voicemail with name and callback number °Kilah Drahos RN Navigator Cardiac Imaging °[facility] °[phone] Office °[phone] Cell ° °

## 2019-11-30 NOTE — Telephone Encounter (Signed)
Reaching out to patient to offer assistance regarding upcoming cardiac imaging study; pt verbalizes understanding of appt date/time, parking situation and where to check in, pre-test NPO status and medications ordered, and verified current allergies; name and call back number provided for further questions should they arise Marchia Bond RN Navigator Cardiac Imaging [facility] [phone] office [phone] cell  Pt to take usual medications/doses/times but avoiding [id number]

## 2019-12-02 ENCOUNTER — Other Ambulatory Visit: Payer: Self-pay

## 2019-12-02 ENCOUNTER — Ambulatory Visit (HOSPITAL_COMMUNITY)
Admission: RE | Admit: 2019-12-02 | Discharge: 2019-12-02 | Disposition: A | Discharge status: Home / Self Care | Payer: Medicare Other | Source: Ambulatory Visit | Attending: Cardiology | Admitting: Cardiology

## 2019-12-02 DIAGNOSIS — R072 Precordial pain: Secondary | ICD-10-CM | POA: Insufficient documentation

## 2019-12-02 MED ORDER — NITROGLYCERIN 0.4 MG SL SUBL
0.8000 mg | SUBLINGUAL_TABLET | Freq: Once | SUBLINGUAL | Status: AC
  Administered 2019-12-02: 0.8 mg via SUBLINGUAL

## 2019-12-02 MED ORDER — IOHEXOL 350 MG/ML SOLN
80.0000 mL | Freq: Once | INTRAVENOUS | Status: AC | PRN
  Administered 2019-12-02: 80 mL via INTRAVENOUS

## 2019-12-02 MED ORDER — NITROGLYCERIN 0.4 MG SL SUBL
SUBLINGUAL_TABLET | SUBLINGUAL | Status: AC
  Filled 2019-12-02: qty 2

## 2019-12-05 ENCOUNTER — Telehealth: Payer: Self-pay | Admitting: Cardiology

## 2019-12-05 MED ORDER — AMLODIPINE BESYLATE 5 MG PO TABS
5.0000 mg | ORAL_TABLET | Freq: Every day | ORAL | 3 refills | Status: DC
Start: 2019-12-05 — End: 2019-12-12

## 2019-12-05 NOTE — Telephone Encounter (Signed)
Spoke with the patient and she will start on amlodipine 5 mg daily. Rx has been sent in.

## 2019-12-05 NOTE — Telephone Encounter (Signed)
Kristi Margarita, MD  Antonieta Iba, RN Mild atherosclerosis of the aorta with no CAD. Suspect that abnormality on nuclear stress test was breast tissue artifact. Please find out if she is still having CP    The patient has been notified of the result and verbalized understanding.  All questions (if any) were answered. Patient states that she has not really had any further chest pain. She does state that her BP has been elevated. Recent readings of 176/72 and 139/100.  She states that she thought Dr. Radford Pax was going to start her on a new BP medication but it didn't happen.  It looks like per OV note 11/4 the patient was to start on amlodipine 2.5 mg daily but this was not sent in?? Will confirm with Dr. Radford Pax whether the patient should be started on amlodipine.

## 2019-12-05 NOTE — Telephone Encounter (Signed)
New message    Patient calling to find out her test results for Ct

## 2019-12-05 NOTE — Telephone Encounter (Signed)
Since BP is that high lets start her on amlodipine 5mg  daily and check BP daily for a week and call with results

## 2019-12-05 NOTE — Telephone Encounter (Signed)
New message     Patient called again wants to know her results so that she can plan her holiday

## 2019-12-12 ENCOUNTER — Telehealth: Payer: Self-pay | Admitting: Cardiology

## 2019-12-12 MED ORDER — AMLODIPINE BESYLATE 10 MG PO TABS
10.0000 mg | ORAL_TABLET | Freq: Every day | ORAL | 3 refills | Status: DC
Start: 2019-12-12 — End: 2019-12-23

## 2019-12-12 NOTE — Telephone Encounter (Signed)
Patient notified. Prescription sent to [facility]

## 2019-12-12 NOTE — Telephone Encounter (Signed)
Patient reports that her blood pressure has been running high. She also reports having headaches and fatigue.  She is taking amlodipine 5mg  every morning, metoprolol 100 mg twice daily, and lisinopril-hctz 20-12.5mg  2 tablets every morning.  Blood pressure readings below were all taken around lunch time.

## 2019-12-12 NOTE — Telephone Encounter (Signed)
  Pt c/o BP issue: STAT if pt c/o blurred vision, one-sided weakness or slurred speech  1. What are your last 5 BP readings?   11/23  178/87 11/25  188/101 hr 63 11/25  171/96  hr 61 11/26  180/98  Hr 64 11/27  158/95  Hr 78 11/28  168/91  Hr 65  2. Are you having any other symptoms (ex. Dizziness, headache, blurred vision, passed out)? headache  3. What is your BP issue? BP running high, patient is taking her medication as prescribed except she has started taking both of the amlodipines in the AM instead of one in the am and one in the pm, she did this about a month ago.

## 2019-12-12 NOTE — Telephone Encounter (Signed)
Increase Amlodipine to 10mg  daily and continue to check BP daily for a week at lunch and call with results

## 2019-12-13 DIAGNOSIS — M1712 Unilateral primary osteoarthritis, left knee: Secondary | ICD-10-CM | POA: Diagnosis not present

## 2019-12-13 DIAGNOSIS — M5116 Intervertebral disc disorders with radiculopathy, lumbar region: Secondary | ICD-10-CM | POA: Diagnosis not present

## 2019-12-13 DIAGNOSIS — I10 Essential (primary) hypertension: Secondary | ICD-10-CM | POA: Diagnosis not present

## 2019-12-13 DIAGNOSIS — E7849 Other hyperlipidemia: Secondary | ICD-10-CM | POA: Diagnosis not present

## 2019-12-14 DIAGNOSIS — I10 Essential (primary) hypertension: Secondary | ICD-10-CM | POA: Diagnosis not present

## 2019-12-14 DIAGNOSIS — M1712 Unilateral primary osteoarthritis, left knee: Secondary | ICD-10-CM | POA: Diagnosis not present

## 2019-12-14 DIAGNOSIS — E7849 Other hyperlipidemia: Secondary | ICD-10-CM | POA: Diagnosis not present

## 2019-12-15 ENCOUNTER — Telehealth: Payer: Self-pay | Admitting: Cardiology

## 2019-12-15 ENCOUNTER — Telehealth: Payer: Self-pay | Admitting: Internal Medicine

## 2019-12-15 NOTE — Telephone Encounter (Signed)
Pt called with elevated BP 194/102, her ears were bothering her as well - I tried to call back but no answer. Her BP has been elevated earlier this week and amlodipine was increased. Phone only rang no answer --when I called back she had talked to on call MD

## 2019-12-15 NOTE — Telephone Encounter (Signed)
Patient called regarding elevated blood pressure of 194/102. On review of her chart, she has been followed closely for hypertension over the past ~2 weeks with escalation of her medical therapy. She states that she recently took her night time metoprolol 100mg . Her other medications include amlodipine 10mg  and lisinopril 40mg . Advised her to take an additional 50mg  of metoprolol this evening and continue 150mg  metoprolol BID. Patient knows to call back or come to the ER if her blood pressure worsens or she develops symptoms.  Alric Quan, MD Cardiology [facility]

## 2019-12-16 ENCOUNTER — Telehealth: Payer: Self-pay

## 2019-12-16 NOTE — Telephone Encounter (Signed)
Spoke with the patient and advised her to take her BP twice per day over the weekend and call us on Monday with her readings. She states that this morning her BP was 130/86.

## 2019-12-16 NOTE — Telephone Encounter (Signed)
-----   Message from Sueanne Margarita, MD sent at 12/16/2019 11:55 AM EST ----- Please have patient check BP BID over the weekend and call on Monday with results ----- Message ----- From: Princella Pellegrini, MD Sent: 12/15/2019   8:38 PM EST To: Sueanne Margarita, MD

## 2019-12-21 ENCOUNTER — Telehealth: Payer: Self-pay | Admitting: Cardiology

## 2019-12-21 DIAGNOSIS — I10 Essential (primary) hypertension: Secondary | ICD-10-CM

## 2019-12-21 NOTE — Telephone Encounter (Signed)
Pt c/o BP issue: STAT if pt c/o blurred vision, one-sided weakness or slurred speech  1. What are your last 5 BP readings? 153/98 HR 77  2. Are you having any other symptoms (ex. Dizziness, headache, blurred vision, passed out)? Headache not usually, dizzy has vertigo  3. What is your BP issue? Patient states she is having a terrible headache. She states she also gets dizzy, but has vertigo.

## 2019-12-21 NOTE — Telephone Encounter (Signed)
Spoke with the patient who reports elevated blood pressures over the weekend. She states that she also has been having a headache today along with ear pressure. She reports fatigue and shortness of breath. Denies chest pain. She is taking amlodipine 10 mg daily and metoprolol tartrate 1.5 tablets (150 mg) twice daily.  Recent BP readings: 12/3: 130/86, 165/91 12/4: 145/86, 160/94 12/5: 147/83, 153/82 12/6: 172/95, 161/94 12/7: 154/93, 169/96 12/8: 153/98, 152/91

## 2019-12-21 NOTE — Telephone Encounter (Signed)
She needs to see her PCP regarding her ear pain and fatigue.  Pain may raise her BP.  Would stop lisinopril HCT and start chlorthalidone 25mg  daily and Benicar 40mg  daily.  BP check twice daily for 1 week and followup with Pharm D in HTN clinic in 1 week.   BMET in 1 week.  If BP remains elevated will stop lopressor and change to Carvedilol.

## 2019-12-22 ENCOUNTER — Telehealth: Payer: Self-pay | Admitting: Cardiology

## 2019-12-22 MED ORDER — OLMESARTAN MEDOXOMIL 40 MG PO TABS
40.0000 mg | ORAL_TABLET | Freq: Every day | ORAL | 3 refills | Status: DC
Start: 2019-12-22 — End: 2019-12-22

## 2019-12-22 MED ORDER — CHLORTHALIDONE 25 MG PO TABS
25.0000 mg | ORAL_TABLET | Freq: Every day | ORAL | 3 refills | Status: DC
Start: 2019-12-22 — End: 2019-12-22

## 2019-12-22 MED ORDER — OLMESARTAN MEDOXOMIL 40 MG PO TABS: 40.0000 mg | ORAL_TABLET | Freq: Every day | ORAL | 3 refills | Status: DC

## 2019-12-22 MED ORDER — CHLORTHALIDONE 25 MG PO TABS: 25.0000 mg | ORAL_TABLET | Freq: Every day | ORAL | 3 refills | Status: DC

## 2019-12-22 NOTE — Telephone Encounter (Signed)
Pt's medications were sent to pt's pharmacy as requested. Confirmation received.  

## 2019-12-22 NOTE — Telephone Encounter (Signed)
Rx has been sent in. Patient has been notified via MyChart message.

## 2019-12-22 NOTE — Telephone Encounter (Signed)
*  STAT* If patient is at the pharmacy, call can be transferred to refill team.   1. Which medications need to be refilled? (please list name of each medication and dose if known)  chlorthalidone (HYGROTON) 25 MG tablet olmesartan (BENICAR) 40 MG tablet 2. Which pharmacy/location (including street and city if local pharmacy) is medication to be sent to? [facility]   3. Do they need a 30 day or 90 day supply? 90 day supply

## 2019-12-23 ENCOUNTER — Other Ambulatory Visit: Payer: Self-pay

## 2019-12-23 MED ORDER — METOPROLOL TARTRATE 100 MG PO TABS: 100.0000 mg | ORAL_TABLET | Freq: Two times a day (BID) | ORAL | 3 refills | Status: DC

## 2019-12-23 MED ORDER — AMLODIPINE BESYLATE 10 MG PO TABS: 10.0000 mg | ORAL_TABLET | Freq: Every day | ORAL | 3 refills | Status: DC

## 2019-12-23 NOTE — Telephone Encounter (Signed)
Pt's medications sent to pt's pharmacy as requested. Confirmation received.

## 2019-12-28 ENCOUNTER — Telehealth: Payer: Self-pay

## 2019-12-28 ENCOUNTER — Other Ambulatory Visit (HOSPITAL_BASED_OUTPATIENT_CLINIC_OR_DEPARTMENT_OTHER): Payer: Self-pay

## 2019-12-28 DIAGNOSIS — R5383 Other fatigue: Secondary | ICD-10-CM

## 2019-12-28 DIAGNOSIS — R0681 Apnea, not elsewhere classified: Secondary | ICD-10-CM

## 2019-12-28 DIAGNOSIS — R0683 Snoring: Secondary | ICD-10-CM

## 2019-12-28 NOTE — Telephone Encounter (Signed)
We will call [facility] tomorrow after apt to clarify this and any other medication changes.

## 2019-12-28 NOTE — Telephone Encounter (Signed)
Spoke with Elmyra Ricks from [facility] who helps the patient organize her medications. The patient reports to her that we had increased her metoprolol to 1.5 tablets (150 mg total) twice daily, however the prescription that she recently received was for 1 tablet (100 mg) twice daily. She would like to know what the patient should be taking.  Patient is prescribed metoprolol tartrate 100 mg twice daily, however she had been having increased BP and informed me on 12/08 that she had been taking metoprolol 1.5 tablets (150 mg) twice daily.  She has an appointment with PharmD tomorrow for HTN.

## 2019-12-29 ENCOUNTER — Ambulatory Visit (INDEPENDENT_AMBULATORY_CARE_PROVIDER_SITE_OTHER): Payer: Medicare Other | Admitting: Pharmacist

## 2019-12-29 ENCOUNTER — Other Ambulatory Visit: Payer: Self-pay

## 2019-12-29 ENCOUNTER — Other Ambulatory Visit: Payer: Medicare Other

## 2019-12-29 DIAGNOSIS — I10 Essential (primary) hypertension: Secondary | ICD-10-CM | POA: Insufficient documentation

## 2019-12-29 MED ORDER — CARVEDILOL 25 MG PO TABS
25.0000 mg | ORAL_TABLET | Freq: Two times a day (BID) | ORAL | 3 refills | Status: DC
Start: 2019-12-29 — End: 2020-09-20

## 2019-12-29 NOTE — Patient Instructions (Addendum)
It was nice to meet you today!  Your blood pressure goal is < 130/68mmHg  STOP taking metoprolol. START taking carvedilol 25mg  - 1 tablet twice a day  Move your amlodipine dosing to night time  Continue taking your other medications  Limit your daily sodium intake ot < 2,000mg  daily, try to cut back on your chips  Bring your blood pressure cuff to your next visit along with your home readings

## 2019-12-29 NOTE — Telephone Encounter (Signed)
[facility] calling back in to clarify what medication the patient should be taking. Informed her that patient has appointment this afternoon with PharmD. [facility] asks for a call back with updates after that appointment.   Thank you!

## 2019-12-29 NOTE — Telephone Encounter (Addendum)
[facility] is the name of the office where pt sees her PCP. She is filling her meds at [facility]. Changed metoprolol to carvedilol today. New rx has been sent to [facility], I have faxed today's note to her PCP at [facility] as well.

## 2019-12-29 NOTE — Progress Notes (Signed)
Patient ID: [name]                 DOB: 1950/07/14                      MRN: [id number]     HPI: [name] is a 69 y.o. female referred by Dr. Radford Pax to HTN clinic. PMH is significant for HTN, HLD, anxiety, depression, fibromyalgia, vertigo, OSA, and extreme fatigue. Nuclear stress test was high risk with large region of moderate reversibility in the anterior wall, anteroseptum and anterolateral walls concerning for anterior ischemia. BP was elevated at 132/100 when pt was seen by Dr Radford Pax on 11/4. Amlodipine 2.5mg  was added. Pt also scheduled for coronary CT which showed mild atherosclerosis of the aorta with no CAD, suspect that abnormality on nuclear stress test was breast tissue artifact. Amlodipine rx was not sent in, pt called with elevated readings and she was instead started on 5mg  dose. BP remained elevated 1 week later and pt was experiencing headaches as well, so her amlodipine was increased to 10mg . A few days later, pt called with BP elevation up to 194/102. Her lisinopril-HCTZ was stopped and she was changed to more effective regimen of chlorthalidone and olmesartan.   Pt presents today in good spirits. Reports tolerating her medications well. Does still feel very fatigued at baseline and becomes SOB easily. This has not become better or worse since her visit with Dr Radford Pax. Still has some vertigo, has been wearing scopolamine patches to help. Has noted slight swelling in her ankles. Still having ear aches in her left ear, when her BP is higher it feels more like a headache. Pt has been taking metoprolol 150mg  BID - cannot find where we told pt to increase this dose, we still have 100mg  dosing listed. She does not have prior allergies to BP meds. [facility] who has been calling about her beta blocker is the name of her PCP office. Uses extra strength Tylenol if she needs to take anything for pain.  Current HTN meds:  Amlodipine 10mg  daily (8am) Chlorthalidone 25mg  daily  (8am) Metoprolol tartrate 100mg  BID Olmesartan 40mg  daily (am)  BP goal: <130/47mmHg  Family History: Alcohol abuse in her brother, brother, and son; Atrial fibrillation in her mother; COPD in her mother; Cancer in her father; Drug abuse in her brother and brother; Fibromyalgia in her daughter; Hypertension in her brother, father, and mother; Paranoid behavior in her son; Sleep apnea in her brother.   Social History: Denies tobacco, alcohol, and illicit drug use.  Diet: Protein shake or eggs in the AM. Lunch - snacks on potato chips. Dinner - cooks at home - meat, vegetable and starch. Likes greek food and grilled chicken salads. Rarely drinks soda, drinks mostly water. 1-2 cups of coffee. Has a sweet tooth too, likes to bake.   Exercise: At home exercises and weights. Does feel fatigued easily.  Home BP readings: Renting a cuff from [facility] through the end of the year, checking in the morning and lunch. 194/102 was highest reading, her ears were ringing too.  Wt Readings from Last 3 Encounters:  11/17/19 205 lb 9.6 oz (93.3 kg)  06/17/19 203 lb 9.6 oz (92.4 kg)  05/25/19 204 lb 9.6 oz (92.8 kg)   BP Readings from Last 3 Encounters:  12/02/19 (!) 157/72  11/17/19 (!) 132/100  06/17/19 (!) 152/84   Pulse Readings from Last 3 Encounters:  12/02/19 (!) 57  11/17/19 60  06/17/19 62  Renal function: CrCl cannot be calculated (Patient's most recent lab result is older than the maximum 21 days allowed.).  Past Medical History:  Diagnosis Date  . Anxiety   . Arthritis    hands, feet  . Back pain    chronic  . Cataracts, bilateral    MD just watching right now  . Cystitides, interstitial, chronic   . Depression   . Fibromyalgia   . GERD (gastroesophageal reflux disease)   . Gout    feet  . High cholesterol   . Hypertension   . Shortness of breath    with exertion  . Sleep apnea    Uses appliance; used CPAP   . SVD (spontaneous vaginal delivery)    x 3  . Urinary  disorder    bladder is in sling per pt  . Urinary tract infection   . Vertigo   . Wears glasses     Current Outpatient Medications on File Prior to Visit  Medication Sig Dispense Refill  . ALPRAZolam (XANAX) 0.5 MG tablet Take 0.5 mg by mouth 2 (two) times daily.   5  . amLODipine (NORVASC) 10 MG tablet Take 1 tablet (10 mg total) by mouth daily. 90 tablet 3  . CAMPHOR-MENTHOL EX Apply 1 application topically 3 (three) times daily as needed (for pain).     . chlorthalidone (HYGROTON) 25 MG tablet Take 1 tablet (25 mg total) by mouth daily. 90 tablet 3  . Cyanocobalamin (VITAMIN B-12 PO) Take 1 tablet by mouth daily.    . DULoxetine (CYMBALTA) 60 MG capsule Take 1 capsule (60 mg total) by mouth 2 (two) times daily. 60 capsule 2  . ergocalciferol (VITAMIN D2) 1.25 MG (50000 UT) capsule Vitamin D2 1,250 mcg (50,000 unit) capsule    . fluticasone (FLONASE) 50 MCG/ACT nasal spray Place 1 spray into both nostrils daily as needed for allergies.     Marland Kitchen meclizine (ANTIVERT) 25 MG tablet TAKE 1 TABLET BY MOUTH EVERY 6 HOURS AS NEEDED FOR DIZZINESS    . Menthol, Topical Analgesic, (MUSCLE & JOINT EX) Apply 1 application topically 3 (three) times daily as needed (pain).     . metoprolol tartrate (LOPRESSOR) 100 MG tablet Take 1 tablet (100 mg total) by mouth 2 (two) times daily. 180 tablet 3  . Naphazoline-Pheniramine (OPCON-A OP) Place 1 drop into both eyes 3 (three) times daily as needed (dry/irritated eyes.).    Marland Kitchen olmesartan (BENICAR) 40 MG tablet Take 1 tablet (40 mg total) by mouth daily. 90 tablet 3  . Omega-3 Fatty Acids (FISH OIL) 1000 MG CAPS Take by mouth.    Marland Kitchen OVER THE COUNTER MEDICATION Take 2 tablets by mouth daily. GOLI Gummies    . polyethylene glycol (MIRALAX) 17 g packet Take 17 g by mouth daily. Dissolve one cap full in solution (water, gatorade, etc.) and administer once cap-full daily. You may titrate up daily by 1 cap-full until the patient is having pudding consistency of stools.  After the patient is able to start passing softer stools they they can continue to take 1/2 cap-full daily for 2 weeks. 14 each 0  . Probiotic Product (PROBIOTIC DAILY PO) Take 1 capsule by mouth daily.     . simvastatin (ZOCOR) 20 MG tablet Take 10 mg by mouth at bedtime.     Marland Kitchen VITAMIN D PO Take 1 tablet by mouth daily.     No current facility-administered medications on file prior to visit.    No Known Allergies  Assessment/Plan:  1. Hypertension - BP close to goal <130/49mmHg, although home readings have been a bit elevated still. Will stop metoprolol 150mg  BID and start carvedilol 25mg  BID for more effective BP lowering. Continue chlorthalidone 25mg  daily, amlodipine 10mg  daily, and olmesartan 40mg  daily. She will move amlodipine to PM dosing to help spread out her BP meds. Checking BMET today with recent med changes. Will follow up with pt in 1 month in clinic. She will record BP readings at home and bring these along with her home cuff to clinic. Encouraged pt to decrease sodium intake to < 2,000mg  daily.   Zamiyah Resendes E. Geovanna Simko, PharmD, BCACP, [facility] [address], [place], [place] [address] Phone: [phone]; Fax: [phone] 12/29/2019 4:24 PM

## 2019-12-30 LAB — BASIC METABOLIC PANEL
BUN/Creatinine Ratio: 30 — ABNORMAL HIGH (ref 12–28)
BUN: 22 mg/dL (ref 8–27)
CO2: 27 mmol/L (ref 20–29)
Calcium: 10 mg/dL (ref 8.7–10.3)
Chloride: 95 mmol/L — ABNORMAL LOW (ref 96–106)
Creatinine, Ser: 0.73 mg/dL (ref 0.57–1.00)
GFR calc Af Amer: 96 mL/min/{1.73_m2} (ref >59)
GFR calc non Af Amer: 83 mL/min/{1.73_m2} (ref >59)
Glucose: 88 mg/dL (ref 65–99)
Potassium: 4 mmol/L (ref 3.5–5.2)
Sodium: 138 mmol/L (ref 134–144)

## 2020-01-02 ENCOUNTER — Other Ambulatory Visit: Payer: Self-pay

## 2020-01-02 ENCOUNTER — Ambulatory Visit: Payer: Medicare Other | Attending: Family Medicine | Admitting: Neurology

## 2020-01-02 DIAGNOSIS — G4733 Obstructive sleep apnea (adult) (pediatric): Secondary | ICD-10-CM | POA: Diagnosis not present

## 2020-01-02 DIAGNOSIS — R0681 Apnea, not elsewhere classified: Secondary | ICD-10-CM

## 2020-01-02 DIAGNOSIS — G4761 Periodic limb movement disorder: Secondary | ICD-10-CM | POA: Insufficient documentation

## 2020-01-02 DIAGNOSIS — R5383 Other fatigue: Secondary | ICD-10-CM

## 2020-01-02 DIAGNOSIS — R0683 Snoring: Secondary | ICD-10-CM

## 2020-01-05 NOTE — Procedures (Signed)
[place] A. Merlene Laughter, MD     [email or web address]             NOCTURNAL POLYSOMNOGRAPHY   LOCATION: [facility]   Patient Name: Margine, Ufford Date: 01/02/2020 Gender: Female D.O.B: Mar 25, 1950 Age (years): 18 Referring Provider: Judd Lien MD Height (inches): 66 Interpreting Physician: Phillips Odor MD, ABSM Weight (lbs): 200 RPSGT: Rosebud Poles BMI: 33 MRN: [id number] Neck Size: 15.50 CLINICAL INFORMATION Sleep Study Type: NPSG     Indication for sleep study: N/A     Epworth Sleepiness Score: 8     SLEEP STUDY TECHNIQUE As per the AASM Manual for the Scoring of Sleep and Associated Events v2.3 (April 2016) with a hypopnea requiring 4% desaturations.  The channels recorded and monitored were frontal, central and occipital EEG, electrooculogram (EOG), submentalis EMG (chin), nasal and oral airflow, thoracic and abdominal wall motion, anterior tibialis EMG, snore microphone, electrocardiogram, and pulse oximetry.  MEDICATIONS Medications self-administered by patient taken the night of the study : N/A  Current Outpatient Medications:  .  ALPRAZolam (XANAX) 0.5 MG tablet, Take 0.5 mg by mouth 2 (two) times daily. , Disp: , Rfl: 5 .  amLODipine (NORVASC) 10 MG tablet, Take 1 tablet (10 mg total) by mouth daily., Disp: 90 tablet, Rfl: 3 .  CAMPHOR-MENTHOL EX, Apply 1 application topically 3 (three) times daily as needed (for pain). , Disp: , Rfl:  .  carvedilol (COREG) 25 MG tablet, Take 1 tablet (25 mg total) by mouth 2 (two) times daily., Disp: 180 tablet, Rfl: 3 .  chlorthalidone (HYGROTON) 25 MG tablet, Take 1 tablet (25 mg total) by mouth daily., Disp: 90 tablet, Rfl: 3 .  Cyanocobalamin (VITAMIN B-12 PO), Take 1 tablet by mouth daily., Disp: , Rfl:  .  DULoxetine (CYMBALTA) 60 MG capsule, Take 1 capsule (60 mg total) by mouth 2 (two) times daily., Disp: 60 capsule, Rfl: 2 .  ergocalciferol (VITAMIN D2) 1.25 MG (50000 UT) capsule,  Vitamin D2 1,250 mcg (50,000 unit) capsule, Disp: , Rfl:  .  fluticasone (FLONASE) 50 MCG/ACT nasal spray, Place 1 spray into both nostrils daily as needed for allergies. , Disp: , Rfl:  .  meclizine (ANTIVERT) 25 MG tablet, TAKE 1 TABLET BY MOUTH EVERY 6 HOURS AS NEEDED FOR DIZZINESS, Disp: , Rfl:  .  Menthol, Topical Analgesic, (MUSCLE & JOINT EX), Apply 1 application topically 3 (three) times daily as needed (pain). , Disp: , Rfl:  .  Naphazoline-Pheniramine (OPCON-A OP), Place 1 drop into both eyes 3 (three) times daily as needed (dry/irritated eyes.)., Disp: , Rfl:  .  olmesartan (BENICAR) 40 MG tablet, Take 1 tablet (40 mg total) by mouth daily., Disp: 90 tablet, Rfl: 3 .  Omega-3 Fatty Acids (FISH OIL) 1000 MG CAPS, Take by mouth., Disp: , Rfl:  .  OVER THE COUNTER MEDICATION, Take 2 tablets by mouth daily. GOLI Gummies, Disp: , Rfl:  .  polyethylene glycol (MIRALAX) 17 g packet, Take 17 g by mouth daily. Dissolve one cap full in solution (water, gatorade, etc.) and administer once cap-full daily. You may titrate up daily by 1 cap-full until the patient is having pudding consistency of stools. After the patient is able to start passing softer stools they they can continue to take 1/2 cap-full daily for 2 weeks., Disp: 14 each, Rfl: 0 .  Probiotic Product (PROBIOTIC DAILY PO), Take 1 capsule by mouth daily. , Disp: , Rfl:  .  simvastatin (ZOCOR) 20 MG tablet, Take 10 mg by  mouth at bedtime. , Disp: , Rfl:  .  VITAMIN D PO, Take 1 tablet by mouth daily., Disp: , Rfl:      SLEEP ARCHITECTURE The study was initiated at 10:19:40 PM and ended at 5:00:47 AM.  Sleep onset time was 14.2 minutes and the sleep efficiency was 82.1%. The total sleep time was 329.4 minutes.  Stage REM latency was 367.0 minutes.  The patient spent 8.20% of the night in stage N1 sleep, 79.05% in stage N2 sleep, 9.41% in stage N3 and 3.3% in REM.  Alpha intrusion was absent.  Supine sleep was 0.00%.  RESPIRATORY  PARAMETERS The overall apnea/hypopnea index (AHI) was 15.5 per hour. There were 0 total apneas, including 0 obstructive, 0 central and 0 mixed apneas. There were 85 hypopneas and 0 RERAs.  The AHI during Stage REM sleep was 5.5 per hour.  AHI while supine was N/A per hour.  The mean oxygen saturation was 90.76%. The minimum SpO2 during sleep was 82.00%.  moderate snoring was noted during this study.  CARDIAC DATA The 2 lead EKG demonstrated sinus rhythm. The mean heart rate was 63.90 beats per minute. Other EKG findings include: PVCs. LEG MOVEMENT DATA The total PLMS were 631 with a resulting PLMS index of 114.95. Associated arousal with leg movement index was 6.2.  IMPRESSIONS  1. Moderate obstructive sleep apnea syndrome is documented with this study. Auto PAP 8-14 is recommended. 2. Severe periodic limb movement is noted.    Delano Metz, MD Diplomate, American Board of Sleep Medicine.  ELECTRONICALLY SIGNED ON:  01/05/2020, 1:11 PM [place] PH: [phone]   FX: [phone] [place]

## 2020-01-13 DIAGNOSIS — I10 Essential (primary) hypertension: Secondary | ICD-10-CM | POA: Diagnosis not present

## 2020-01-13 DIAGNOSIS — M1712 Unilateral primary osteoarthritis, left knee: Secondary | ICD-10-CM | POA: Diagnosis not present

## 2020-01-13 DIAGNOSIS — E7849 Other hyperlipidemia: Secondary | ICD-10-CM | POA: Diagnosis not present

## 2020-01-25 ENCOUNTER — Telehealth: Payer: Self-pay | Admitting: Cardiology

## 2020-01-25 NOTE — Telephone Encounter (Signed)
Patient had to cancel her appt with PharmD on 02/02/20 due to their being a conflict with her daughter's schedule.  I offered to reschedule for 02/06/20 beings that was the next available day. She also had to cancel her 01/26/20 because she is not feeling well. She stated she did not want until 02/06/20 and wasn't sure if she could be worked in or do a virtual. Then patient then became upset and stated she was upset at herself and wanted to cancel altogether. Not sure if one of the pharmacist's wanted to reach out to her to see if she wanted to do virtual next week or even tomorrow. Please advise.

## 2020-01-25 NOTE — Telephone Encounter (Signed)
Called pt back. Provided her with next 3 dates available: 1/13, 1/20, and 1/24. Pt is sick so she cannot come tomorrow. States she couldn't make a decision between the other 2 dates. She will call clinic back later to schedule.

## 2020-01-26 ENCOUNTER — Ambulatory Visit: Payer: Medicare Other

## 2020-01-26 DIAGNOSIS — Z20828 Contact with and (suspected) exposure to other viral communicable diseases: Secondary | ICD-10-CM | POA: Diagnosis not present

## 2020-01-26 DIAGNOSIS — R0602 Shortness of breath: Secondary | ICD-10-CM | POA: Diagnosis not present

## 2020-02-02 ENCOUNTER — Ambulatory Visit: Payer: Medicare Other

## 2020-02-11 DIAGNOSIS — M1712 Unilateral primary osteoarthritis, left knee: Secondary | ICD-10-CM | POA: Diagnosis not present

## 2020-02-11 DIAGNOSIS — M5116 Intervertebral disc disorders with radiculopathy, lumbar region: Secondary | ICD-10-CM | POA: Diagnosis not present

## 2020-02-11 DIAGNOSIS — I10 Essential (primary) hypertension: Secondary | ICD-10-CM | POA: Diagnosis not present

## 2020-02-11 DIAGNOSIS — E7849 Other hyperlipidemia: Secondary | ICD-10-CM | POA: Diagnosis not present

## 2020-02-14 DIAGNOSIS — I10 Essential (primary) hypertension: Secondary | ICD-10-CM | POA: Diagnosis not present

## 2020-02-14 DIAGNOSIS — G4733 Obstructive sleep apnea (adult) (pediatric): Secondary | ICD-10-CM | POA: Diagnosis not present

## 2020-02-14 DIAGNOSIS — E7849 Other hyperlipidemia: Secondary | ICD-10-CM | POA: Diagnosis not present

## 2020-02-14 DIAGNOSIS — E782 Mixed hyperlipidemia: Secondary | ICD-10-CM | POA: Diagnosis not present

## 2020-02-14 DIAGNOSIS — N3010 Interstitial cystitis (chronic) without hematuria: Secondary | ICD-10-CM | POA: Diagnosis not present

## 2020-02-14 DIAGNOSIS — R5383 Other fatigue: Secondary | ICD-10-CM | POA: Diagnosis not present

## 2020-02-14 DIAGNOSIS — F339 Major depressive disorder, recurrent, unspecified: Secondary | ICD-10-CM | POA: Diagnosis not present

## 2020-02-14 DIAGNOSIS — E559 Vitamin D deficiency, unspecified: Secondary | ICD-10-CM | POA: Diagnosis not present

## 2020-02-14 DIAGNOSIS — G629 Polyneuropathy, unspecified: Secondary | ICD-10-CM | POA: Diagnosis not present

## 2020-02-14 DIAGNOSIS — Z6834 Body mass index (BMI) 34.0-34.9, adult: Secondary | ICD-10-CM | POA: Diagnosis not present

## 2020-02-14 DIAGNOSIS — M797 Fibromyalgia: Secondary | ICD-10-CM | POA: Diagnosis not present

## 2020-02-14 DIAGNOSIS — U071 COVID-19: Secondary | ICD-10-CM | POA: Diagnosis not present

## 2020-03-12 DIAGNOSIS — I10 Essential (primary) hypertension: Secondary | ICD-10-CM | POA: Diagnosis not present

## 2020-03-12 DIAGNOSIS — E7849 Other hyperlipidemia: Secondary | ICD-10-CM | POA: Diagnosis not present

## 2020-03-12 DIAGNOSIS — M5116 Intervertebral disc disorders with radiculopathy, lumbar region: Secondary | ICD-10-CM | POA: Diagnosis not present

## 2020-03-12 DIAGNOSIS — M1712 Unilateral primary osteoarthritis, left knee: Secondary | ICD-10-CM | POA: Diagnosis not present

## 2020-04-09 ENCOUNTER — Telehealth: Payer: Self-pay | Admitting: Diagnostic Neuroimaging

## 2020-04-09 NOTE — Telephone Encounter (Signed)
Noted. If you like, I can reach out to patient and ask her about following up w/ Dr. Merlene Laughter on this instead.

## 2020-04-09 NOTE — Telephone Encounter (Signed)
I reviewed the consult. Doesn't look like anything new, lots of chronic pain, Fibromyalgia, chronic low back pain followed by orthopaedics, nothing in notes specified anything new just progression of symptoms she has so I will decline to see since Dr. Leta Baptist saw her for similar. I asked Ivar Drape to show the consult to Dr. Leta Baptist to review and see if she needs to follow up with ortho or with Dr. Leta Baptist thanks.

## 2020-04-09 NOTE — Telephone Encounter (Signed)
I would like to see the consult, if her symptoms are similar to what Dr. Leta Baptist saw her for then I would ask she see him in follow up, if they are completely different I can consider. Also, Patient is already established with Dr. Merlene Laughter, just saw him for a sleep study, unclear if she should stay with him for this too (question for Dr. Leta Baptist)

## 2020-04-09 NOTE — Telephone Encounter (Signed)
Patient has been referred back to [place] for neuropathy. She's seen Dr. Leta Baptist in the past (once in 2020), but is now requesting to see Dr. Jaynee Eagles. Would you both be ok with this?

## 2020-04-11 DIAGNOSIS — I10 Essential (primary) hypertension: Secondary | ICD-10-CM | POA: Diagnosis not present

## 2020-04-11 DIAGNOSIS — M5116 Intervertebral disc disorders with radiculopathy, lumbar region: Secondary | ICD-10-CM | POA: Diagnosis not present

## 2020-04-11 DIAGNOSIS — E7849 Other hyperlipidemia: Secondary | ICD-10-CM | POA: Diagnosis not present

## 2020-04-11 DIAGNOSIS — M1712 Unilateral primary osteoarthritis, left knee: Secondary | ICD-10-CM | POA: Diagnosis not present

## 2020-04-12 IMAGING — MR MR LUMBAR SPINE W/O CM
4 of 5 series · 15 of 48 positions shown · non-contrast
Comparison: 11/25/2017

CLINICAL DATA: Low back pain and left lower extremity
radiculopathy. Lumbar surgery in [DATE].

EXAM:
MRI LUMBAR SPINE WITHOUT CONTRAST
TECHNIQUE: Multiplanar, multisequence MR imaging of the lumbar spine was
performed. No intravenous contrast was administered.

[Series 3: T2 · sagittal · 4.0mm · 0.68mm/px · 6 of 15 slices shown (1 of 2)]
[im 1/15]
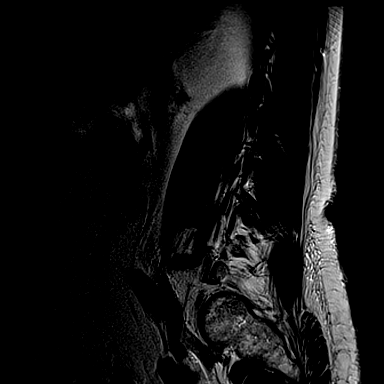
[im 3/15]
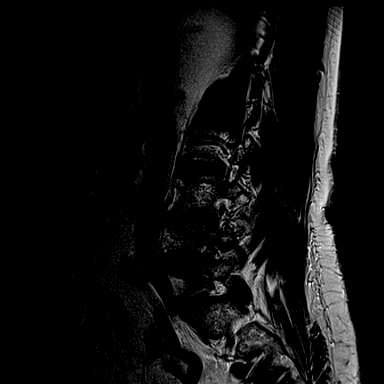
[im 6/15]
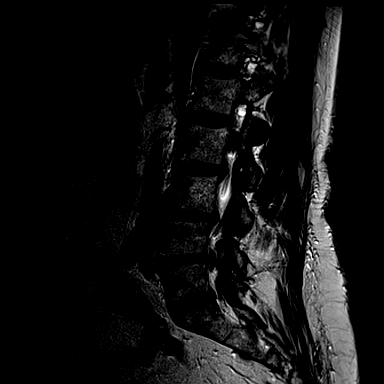
[im 9/15]
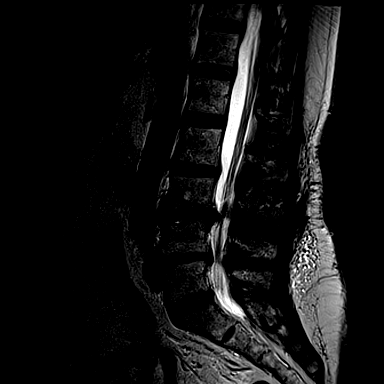
[im 12/15]
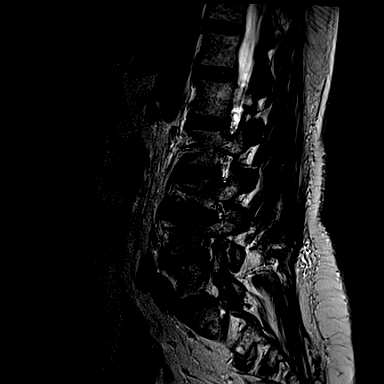
[im 15/15]
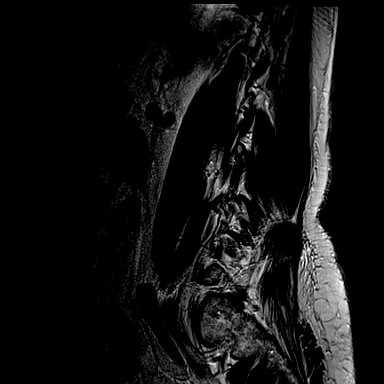

[Series 4: T1 · sagittal · 4.0mm · 0.34mm/px · 3 of 15 slices shown (1 of 2)]
[im 3/15]
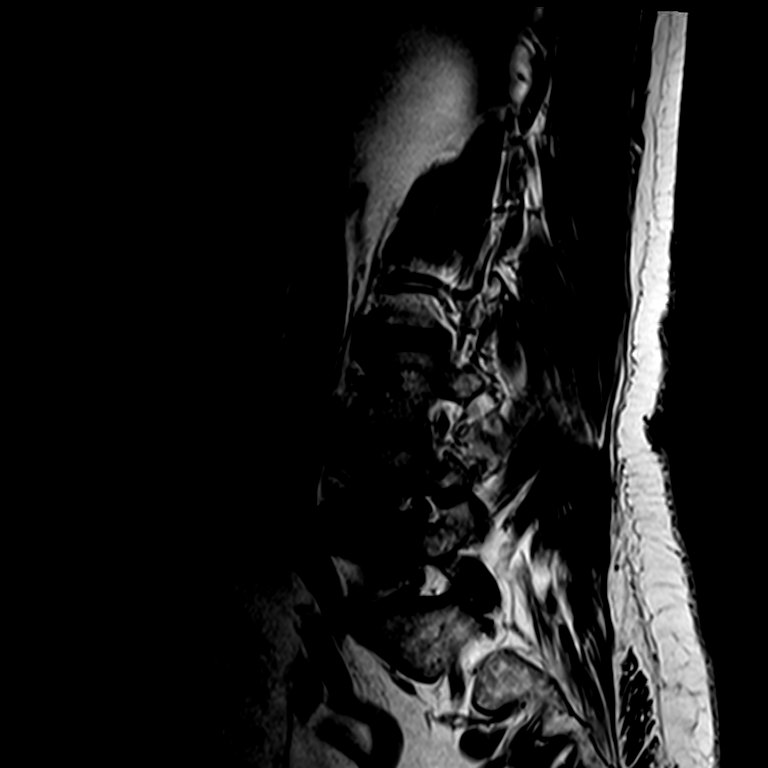
[im 9/15]
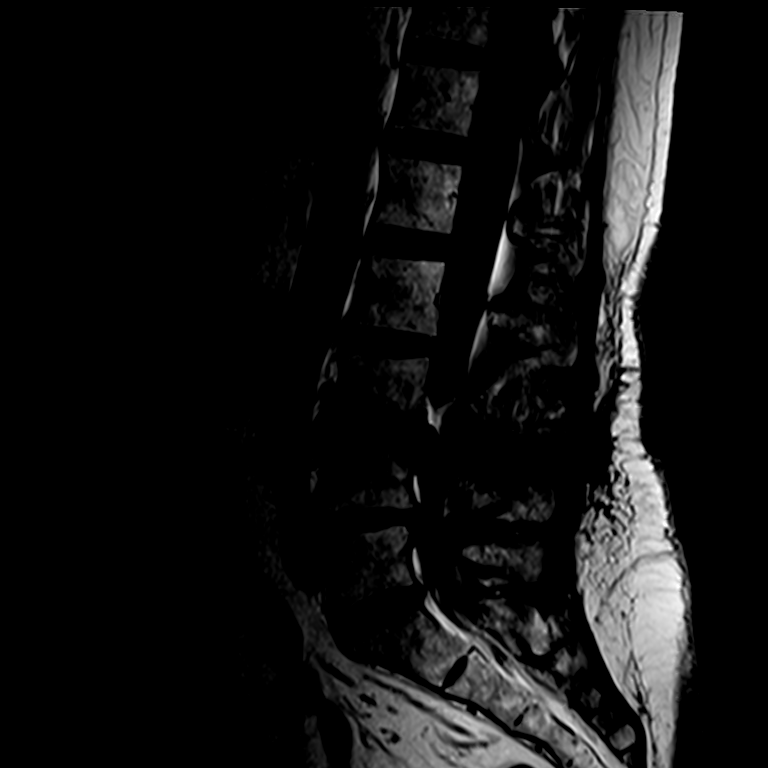
[im 15/15]
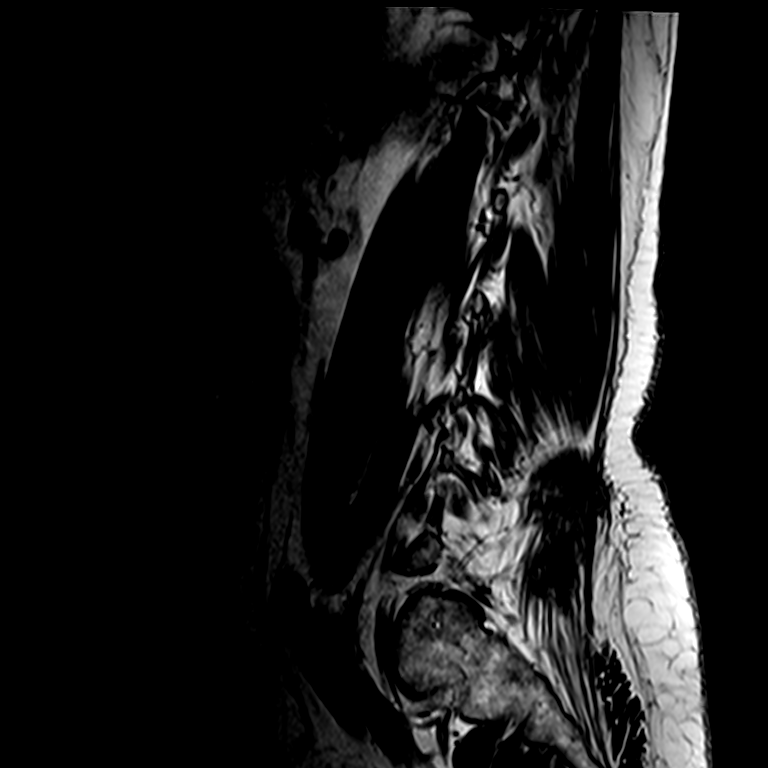

[Series 6: T2 · axial · 4.0mm · 0.28mm/px · z∈[-50,+86]mm · 3 of 35 slices shown (2 of 2)]
[im 5/35]
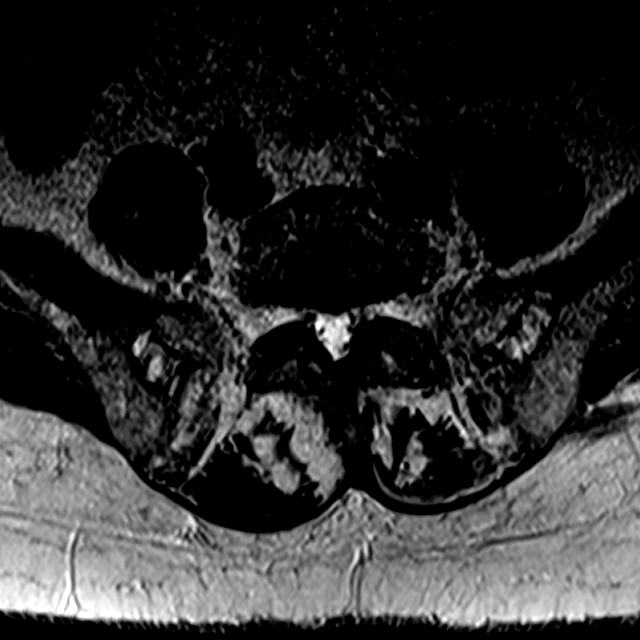
[im 18/35]
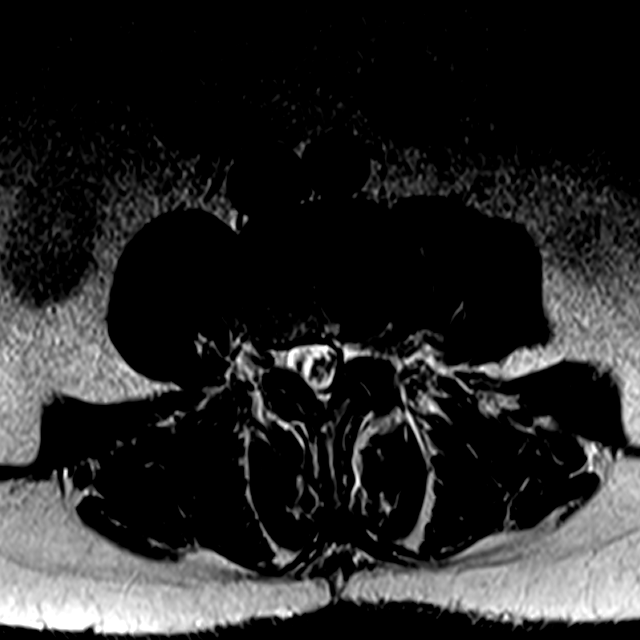
[im 30/35]
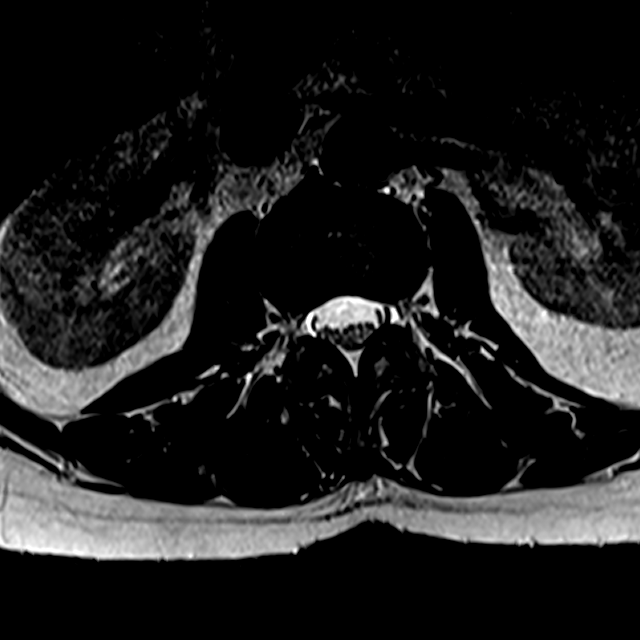

[Series 7: T1 · axial · 4.0mm · 0.28mm/px · z∈[-50,+86]mm · 3 of 35 slices shown (2 of 2)]
[im 5/35]
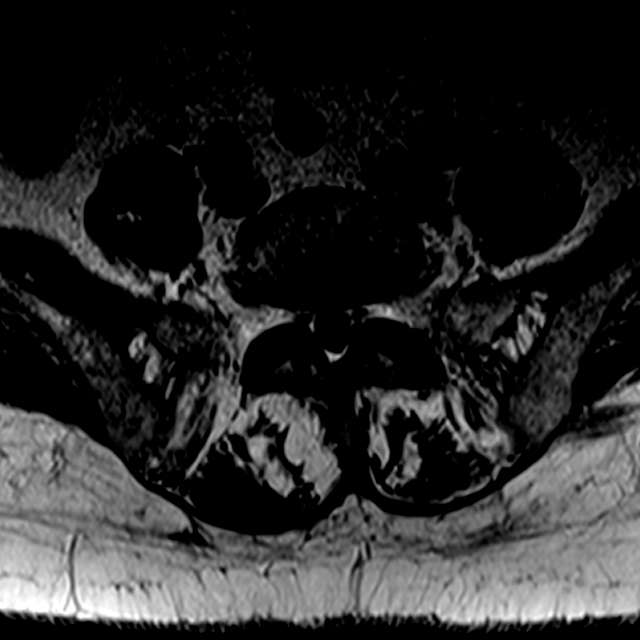
[im 18/35]
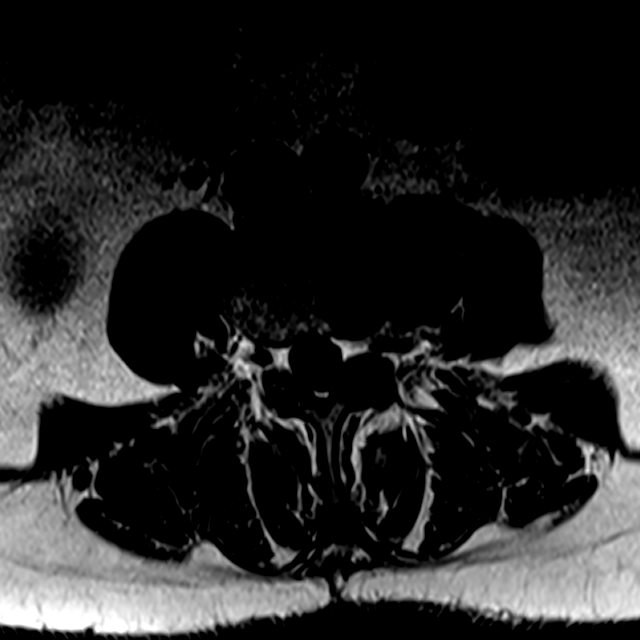
[im 30/35]
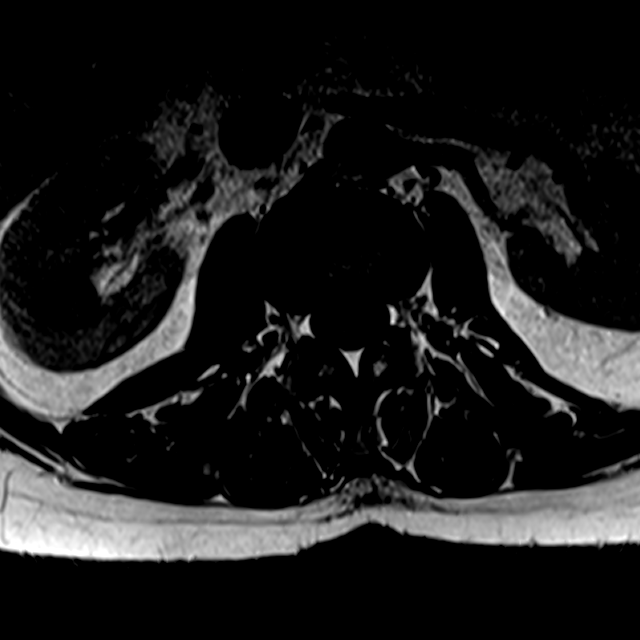

[15 of 48 positions shown; findings below may reference images not displayed]

FINDINGS: Segmentation:  Standard.

Alignment: Unchanged trace retrolisthesis of L3 on L4 and L4 on L5.
Mild lumbar dextroscoliosis.

Vertebrae: No fracture or suspicious osseous lesion. L3 vertebral
body hemangioma. Degenerative endplate changes at L3-4 greater than
L4-5 associated with severe disc space narrowing. Mild disc space
narrowing at L2-3.

Conus medullaris and cauda equina: Conus extends to the upper L1
level. Conus and cauda equina appear normal.

Paraspinal and other soft tissues: Unremarkable.

Disc levels:

Disc desiccation from L2-3 to L5-S1.

T12-L1 and L1-2: Negative.

L2-3: Mild disc bulging, right foraminal and extraforaminal disc
protrusion, and mild facet and ligamentum flavum hypertrophy result
in minimal right neural foraminal narrowing without spinal stenosis,
unchanged.

L3-4: Prior left laminectomy with interval evolution of
postoperative changes and decreased distortion of the left lateral
recess. Disc bulging greater to the left and mild facet and
ligamentum flavum hypertrophy result in mild residual left lateral
recess stenosis and moderate left neural foraminal stenosis
potentially affecting the left L3 and L4 nerve roots. No significant
spinal stenosis.

L4-5: Circumferential disc bulging greater to the right and moderate
facet and ligamentum flavum hypertrophy result in moderate spinal
stenosis, mild-to-moderate bilateral lateral recess stenosis, and
moderate right and mild left neural foraminal stenosis, unchanged.
The right L4 and bilateral L5 nerve roots may be affected.

L5-S1: Minimal disc bulging and mild facet hypertrophy without
stenosis, unchanged.
IMPRESSION: 1. Interval evolution of postsurgical changes at L3-4. Mild residual
left lateral recess and moderate left neural foraminal stenosis.
2. Unchanged disc and facet degeneration elsewhere, most notable at
L4-5 where there is moderate spinal and right greater than left
neural foraminal stenosis.

## 2020-04-17 DIAGNOSIS — N83202 Unspecified ovarian cyst, left side: Secondary | ICD-10-CM | POA: Diagnosis not present

## 2020-05-02 DIAGNOSIS — M79672 Pain in left foot: Secondary | ICD-10-CM | POA: Diagnosis not present

## 2020-05-02 DIAGNOSIS — M79671 Pain in right foot: Secondary | ICD-10-CM | POA: Diagnosis not present

## 2020-05-02 DIAGNOSIS — R200 Anesthesia of skin: Secondary | ICD-10-CM | POA: Diagnosis not present

## 2020-05-12 DIAGNOSIS — M1712 Unilateral primary osteoarthritis, left knee: Secondary | ICD-10-CM | POA: Diagnosis not present

## 2020-05-12 DIAGNOSIS — I10 Essential (primary) hypertension: Secondary | ICD-10-CM | POA: Diagnosis not present

## 2020-05-12 DIAGNOSIS — E7849 Other hyperlipidemia: Secondary | ICD-10-CM | POA: Diagnosis not present

## 2020-05-12 DIAGNOSIS — M5116 Intervertebral disc disorders with radiculopathy, lumbar region: Secondary | ICD-10-CM | POA: Diagnosis not present

## 2020-05-15 DIAGNOSIS — R1904 Left lower quadrant abdominal swelling, mass and lump: Secondary | ICD-10-CM | POA: Diagnosis not present

## 2020-05-22 DIAGNOSIS — R059 Cough, unspecified: Secondary | ICD-10-CM | POA: Diagnosis not present

## 2020-05-22 DIAGNOSIS — M797 Fibromyalgia: Secondary | ICD-10-CM | POA: Diagnosis not present

## 2020-05-22 DIAGNOSIS — R5383 Other fatigue: Secondary | ICD-10-CM | POA: Diagnosis not present

## 2020-05-28 DIAGNOSIS — M47816 Spondylosis without myelopathy or radiculopathy, lumbar region: Secondary | ICD-10-CM | POA: Diagnosis not present

## 2020-05-28 DIAGNOSIS — M7040 Prepatellar bursitis, unspecified knee: Secondary | ICD-10-CM | POA: Diagnosis not present

## 2020-05-28 DIAGNOSIS — M9903 Segmental and somatic dysfunction of lumbar region: Secondary | ICD-10-CM | POA: Diagnosis not present

## 2020-06-13 DIAGNOSIS — M9903 Segmental and somatic dysfunction of lumbar region: Secondary | ICD-10-CM | POA: Diagnosis not present

## 2020-06-13 DIAGNOSIS — M47816 Spondylosis without myelopathy or radiculopathy, lumbar region: Secondary | ICD-10-CM | POA: Diagnosis not present

## 2020-06-13 DIAGNOSIS — S233XXA Sprain of ligaments of thoracic spine, initial encounter: Secondary | ICD-10-CM | POA: Diagnosis not present

## 2020-06-13 DIAGNOSIS — M7040 Prepatellar bursitis, unspecified knee: Secondary | ICD-10-CM | POA: Diagnosis not present

## 2020-06-13 DIAGNOSIS — M9901 Segmental and somatic dysfunction of cervical region: Secondary | ICD-10-CM | POA: Diagnosis not present

## 2020-06-13 DIAGNOSIS — M9902 Segmental and somatic dysfunction of thoracic region: Secondary | ICD-10-CM | POA: Diagnosis not present

## 2020-06-19 DIAGNOSIS — S233XXA Sprain of ligaments of thoracic spine, initial encounter: Secondary | ICD-10-CM | POA: Diagnosis not present

## 2020-06-19 DIAGNOSIS — M47816 Spondylosis without myelopathy or radiculopathy, lumbar region: Secondary | ICD-10-CM | POA: Diagnosis not present

## 2020-06-19 DIAGNOSIS — M9902 Segmental and somatic dysfunction of thoracic region: Secondary | ICD-10-CM | POA: Diagnosis not present

## 2020-06-19 DIAGNOSIS — M9901 Segmental and somatic dysfunction of cervical region: Secondary | ICD-10-CM | POA: Diagnosis not present

## 2020-06-19 DIAGNOSIS — M9903 Segmental and somatic dysfunction of lumbar region: Secondary | ICD-10-CM | POA: Diagnosis not present

## 2020-06-26 DIAGNOSIS — H16223 Keratoconjunctivitis sicca, not specified as Sjogren's, bilateral: Secondary | ICD-10-CM | POA: Diagnosis not present

## 2020-07-12 DIAGNOSIS — M1712 Unilateral primary osteoarthritis, left knee: Secondary | ICD-10-CM | POA: Diagnosis not present

## 2020-07-12 DIAGNOSIS — E7849 Other hyperlipidemia: Secondary | ICD-10-CM | POA: Diagnosis not present

## 2020-07-12 DIAGNOSIS — M5116 Intervertebral disc disorders with radiculopathy, lumbar region: Secondary | ICD-10-CM | POA: Diagnosis not present

## 2020-07-12 DIAGNOSIS — I10 Essential (primary) hypertension: Secondary | ICD-10-CM | POA: Diagnosis not present

## 2020-07-20 DIAGNOSIS — R0681 Apnea, not elsewhere classified: Secondary | ICD-10-CM | POA: Diagnosis not present

## 2020-07-20 DIAGNOSIS — F32A Depression, unspecified: Secondary | ICD-10-CM | POA: Diagnosis not present

## 2020-07-20 DIAGNOSIS — R0683 Snoring: Secondary | ICD-10-CM | POA: Diagnosis not present

## 2020-07-20 DIAGNOSIS — R5383 Other fatigue: Secondary | ICD-10-CM | POA: Diagnosis not present

## 2020-07-20 DIAGNOSIS — G4733 Obstructive sleep apnea (adult) (pediatric): Secondary | ICD-10-CM | POA: Diagnosis not present

## 2020-08-06 DIAGNOSIS — F32A Depression, unspecified: Secondary | ICD-10-CM | POA: Diagnosis not present

## 2020-08-06 DIAGNOSIS — G4733 Obstructive sleep apnea (adult) (pediatric): Secondary | ICD-10-CM | POA: Diagnosis not present

## 2020-08-06 DIAGNOSIS — R0681 Apnea, not elsewhere classified: Secondary | ICD-10-CM | POA: Diagnosis not present

## 2020-08-06 DIAGNOSIS — R5383 Other fatigue: Secondary | ICD-10-CM | POA: Diagnosis not present

## 2020-08-06 DIAGNOSIS — R0683 Snoring: Secondary | ICD-10-CM | POA: Diagnosis not present

## 2020-08-16 DIAGNOSIS — I10 Essential (primary) hypertension: Secondary | ICD-10-CM | POA: Diagnosis not present

## 2020-08-16 DIAGNOSIS — R7303 Prediabetes: Secondary | ICD-10-CM | POA: Diagnosis not present

## 2020-08-16 DIAGNOSIS — G629 Polyneuropathy, unspecified: Secondary | ICD-10-CM | POA: Diagnosis not present

## 2020-08-16 DIAGNOSIS — M5116 Intervertebral disc disorders with radiculopathy, lumbar region: Secondary | ICD-10-CM | POA: Diagnosis not present

## 2020-08-16 DIAGNOSIS — G4733 Obstructive sleep apnea (adult) (pediatric): Secondary | ICD-10-CM | POA: Diagnosis not present

## 2020-08-20 DIAGNOSIS — F32A Depression, unspecified: Secondary | ICD-10-CM | POA: Diagnosis not present

## 2020-08-20 DIAGNOSIS — R0681 Apnea, not elsewhere classified: Secondary | ICD-10-CM | POA: Diagnosis not present

## 2020-08-20 DIAGNOSIS — R5383 Other fatigue: Secondary | ICD-10-CM | POA: Diagnosis not present

## 2020-08-20 DIAGNOSIS — G4733 Obstructive sleep apnea (adult) (pediatric): Secondary | ICD-10-CM | POA: Diagnosis not present

## 2020-08-20 DIAGNOSIS — R0683 Snoring: Secondary | ICD-10-CM | POA: Diagnosis not present

## 2020-09-08 ENCOUNTER — Other Ambulatory Visit: Payer: Self-pay | Admitting: Cardiology

## 2020-09-20 ENCOUNTER — Other Ambulatory Visit: Payer: Self-pay | Admitting: Cardiology

## 2020-09-20 DIAGNOSIS — R0681 Apnea, not elsewhere classified: Secondary | ICD-10-CM | POA: Diagnosis not present

## 2020-09-20 DIAGNOSIS — R5383 Other fatigue: Secondary | ICD-10-CM | POA: Diagnosis not present

## 2020-09-20 DIAGNOSIS — R0683 Snoring: Secondary | ICD-10-CM | POA: Diagnosis not present

## 2020-09-20 DIAGNOSIS — G4733 Obstructive sleep apnea (adult) (pediatric): Secondary | ICD-10-CM | POA: Diagnosis not present

## 2020-09-20 DIAGNOSIS — F32A Depression, unspecified: Secondary | ICD-10-CM | POA: Diagnosis not present

## 2020-10-01 DIAGNOSIS — M25562 Pain in left knee: Secondary | ICD-10-CM | POA: Diagnosis not present

## 2020-10-01 DIAGNOSIS — M545 Low back pain, unspecified: Secondary | ICD-10-CM | POA: Diagnosis not present

## 2020-10-01 DIAGNOSIS — M25561 Pain in right knee: Secondary | ICD-10-CM | POA: Diagnosis not present

## 2020-10-01 DIAGNOSIS — M1712 Unilateral primary osteoarthritis, left knee: Secondary | ICD-10-CM | POA: Diagnosis not present

## 2020-10-12 DIAGNOSIS — M5116 Intervertebral disc disorders with radiculopathy, lumbar region: Secondary | ICD-10-CM | POA: Diagnosis not present

## 2020-10-12 DIAGNOSIS — E7849 Other hyperlipidemia: Secondary | ICD-10-CM | POA: Diagnosis not present

## 2020-10-12 DIAGNOSIS — M1712 Unilateral primary osteoarthritis, left knee: Secondary | ICD-10-CM | POA: Diagnosis not present

## 2020-10-12 DIAGNOSIS — I10 Essential (primary) hypertension: Secondary | ICD-10-CM | POA: Diagnosis not present

## 2020-10-15 ENCOUNTER — Other Ambulatory Visit (HOSPITAL_COMMUNITY): Payer: Self-pay | Admitting: Family Medicine

## 2020-10-15 DIAGNOSIS — G4733 Obstructive sleep apnea (adult) (pediatric): Secondary | ICD-10-CM | POA: Diagnosis not present

## 2020-10-15 DIAGNOSIS — Z1231 Encounter for screening mammogram for malignant neoplasm of breast: Secondary | ICD-10-CM

## 2020-10-15 DIAGNOSIS — I10 Essential (primary) hypertension: Secondary | ICD-10-CM | POA: Diagnosis not present

## 2020-10-15 DIAGNOSIS — M5116 Intervertebral disc disorders with radiculopathy, lumbar region: Secondary | ICD-10-CM | POA: Diagnosis not present

## 2020-10-15 DIAGNOSIS — M797 Fibromyalgia: Secondary | ICD-10-CM | POA: Diagnosis not present

## 2020-10-15 DIAGNOSIS — E7849 Other hyperlipidemia: Secondary | ICD-10-CM | POA: Diagnosis not present

## 2020-10-15 DIAGNOSIS — G629 Polyneuropathy, unspecified: Secondary | ICD-10-CM | POA: Diagnosis not present

## 2020-10-20 DIAGNOSIS — R5383 Other fatigue: Secondary | ICD-10-CM | POA: Diagnosis not present

## 2020-10-20 DIAGNOSIS — R0683 Snoring: Secondary | ICD-10-CM | POA: Diagnosis not present

## 2020-10-20 DIAGNOSIS — F32A Depression, unspecified: Secondary | ICD-10-CM | POA: Diagnosis not present

## 2020-10-20 DIAGNOSIS — R0681 Apnea, not elsewhere classified: Secondary | ICD-10-CM | POA: Diagnosis not present

## 2020-10-20 DIAGNOSIS — G4733 Obstructive sleep apnea (adult) (pediatric): Secondary | ICD-10-CM | POA: Diagnosis not present

## 2020-10-22 ENCOUNTER — Ambulatory Visit (HOSPITAL_COMMUNITY)
Admission: RE | Admit: 2020-10-22 | Discharge: 2020-10-22 | Disposition: A | Discharge status: Home / Self Care | Payer: Medicare Other | Source: Ambulatory Visit | Attending: Family Medicine | Admitting: Family Medicine

## 2020-10-22 ENCOUNTER — Other Ambulatory Visit: Payer: Self-pay

## 2020-10-22 DIAGNOSIS — Z1231 Encounter for screening mammogram for malignant neoplasm of breast: Secondary | ICD-10-CM | POA: Diagnosis not present

## 2020-10-30 ENCOUNTER — Other Ambulatory Visit (HOSPITAL_COMMUNITY): Payer: Self-pay | Admitting: Family Medicine

## 2020-10-30 DIAGNOSIS — R928 Other abnormal and inconclusive findings on diagnostic imaging of breast: Secondary | ICD-10-CM

## 2020-11-05 DIAGNOSIS — G4733 Obstructive sleep apnea (adult) (pediatric): Secondary | ICD-10-CM | POA: Diagnosis not present

## 2020-11-05 DIAGNOSIS — I10 Essential (primary) hypertension: Secondary | ICD-10-CM | POA: Diagnosis not present

## 2020-11-07 ENCOUNTER — Other Ambulatory Visit: Payer: Self-pay

## 2020-11-07 ENCOUNTER — Ambulatory Visit (HOSPITAL_COMMUNITY)
Admission: RE | Admit: 2020-11-07 | Discharge: 2020-11-07 | Disposition: A | Discharge status: Home / Self Care | Payer: Medicare Other | Source: Ambulatory Visit | Attending: Family Medicine | Admitting: Family Medicine

## 2020-11-07 DIAGNOSIS — R928 Other abnormal and inconclusive findings on diagnostic imaging of breast: Secondary | ICD-10-CM | POA: Diagnosis not present

## 2020-11-07 DIAGNOSIS — N6011 Diffuse cystic mastopathy of right breast: Secondary | ICD-10-CM | POA: Diagnosis not present

## 2020-11-12 DIAGNOSIS — M1712 Unilateral primary osteoarthritis, left knee: Secondary | ICD-10-CM | POA: Diagnosis not present

## 2020-11-20 DIAGNOSIS — G4733 Obstructive sleep apnea (adult) (pediatric): Secondary | ICD-10-CM | POA: Diagnosis not present

## 2020-11-20 DIAGNOSIS — F32A Depression, unspecified: Secondary | ICD-10-CM | POA: Diagnosis not present

## 2020-11-20 DIAGNOSIS — R0683 Snoring: Secondary | ICD-10-CM | POA: Diagnosis not present

## 2020-11-20 DIAGNOSIS — R5383 Other fatigue: Secondary | ICD-10-CM | POA: Diagnosis not present

## 2020-11-20 DIAGNOSIS — R0681 Apnea, not elsewhere classified: Secondary | ICD-10-CM | POA: Diagnosis not present

## 2020-12-20 DIAGNOSIS — R5383 Other fatigue: Secondary | ICD-10-CM | POA: Diagnosis not present

## 2020-12-20 DIAGNOSIS — G4733 Obstructive sleep apnea (adult) (pediatric): Secondary | ICD-10-CM | POA: Diagnosis not present

## 2020-12-20 DIAGNOSIS — F32A Depression, unspecified: Secondary | ICD-10-CM | POA: Diagnosis not present

## 2020-12-20 DIAGNOSIS — R0683 Snoring: Secondary | ICD-10-CM | POA: Diagnosis not present

## 2020-12-20 DIAGNOSIS — R0681 Apnea, not elsewhere classified: Secondary | ICD-10-CM | POA: Diagnosis not present

## 2021-01-11 DIAGNOSIS — M1712 Unilateral primary osteoarthritis, left knee: Secondary | ICD-10-CM | POA: Diagnosis not present

## 2021-01-11 DIAGNOSIS — E7849 Other hyperlipidemia: Secondary | ICD-10-CM | POA: Diagnosis not present

## 2021-01-11 DIAGNOSIS — I10 Essential (primary) hypertension: Secondary | ICD-10-CM | POA: Diagnosis not present

## 2021-01-11 DIAGNOSIS — M5116 Intervertebral disc disorders with radiculopathy, lumbar region: Secondary | ICD-10-CM | POA: Diagnosis not present

## 2021-01-15 DIAGNOSIS — E538 Deficiency of other specified B group vitamins: Secondary | ICD-10-CM | POA: Diagnosis not present

## 2021-01-15 DIAGNOSIS — R739 Hyperglycemia, unspecified: Secondary | ICD-10-CM | POA: Diagnosis not present

## 2021-01-15 DIAGNOSIS — E559 Vitamin D deficiency, unspecified: Secondary | ICD-10-CM | POA: Diagnosis not present

## 2021-01-15 DIAGNOSIS — Z1329 Encounter for screening for other suspected endocrine disorder: Secondary | ICD-10-CM | POA: Diagnosis not present

## 2021-01-15 DIAGNOSIS — E782 Mixed hyperlipidemia: Secondary | ICD-10-CM | POA: Diagnosis not present

## 2021-01-15 DIAGNOSIS — E7849 Other hyperlipidemia: Secondary | ICD-10-CM | POA: Diagnosis not present

## 2021-01-15 DIAGNOSIS — I10 Essential (primary) hypertension: Secondary | ICD-10-CM | POA: Diagnosis not present

## 2021-01-20 DIAGNOSIS — G4733 Obstructive sleep apnea (adult) (pediatric): Secondary | ICD-10-CM | POA: Diagnosis not present

## 2021-01-20 DIAGNOSIS — R0681 Apnea, not elsewhere classified: Secondary | ICD-10-CM | POA: Diagnosis not present

## 2021-01-20 DIAGNOSIS — R0683 Snoring: Secondary | ICD-10-CM | POA: Diagnosis not present

## 2021-01-20 DIAGNOSIS — R5383 Other fatigue: Secondary | ICD-10-CM | POA: Diagnosis not present

## 2021-01-20 DIAGNOSIS — F32A Depression, unspecified: Secondary | ICD-10-CM | POA: Diagnosis not present

## 2021-01-21 DIAGNOSIS — G629 Polyneuropathy, unspecified: Secondary | ICD-10-CM | POA: Diagnosis not present

## 2021-01-21 DIAGNOSIS — I10 Essential (primary) hypertension: Secondary | ICD-10-CM | POA: Diagnosis not present

## 2021-01-21 DIAGNOSIS — E559 Vitamin D deficiency, unspecified: Secondary | ICD-10-CM | POA: Diagnosis not present

## 2021-01-21 DIAGNOSIS — E7849 Other hyperlipidemia: Secondary | ICD-10-CM | POA: Diagnosis not present

## 2021-01-21 DIAGNOSIS — M797 Fibromyalgia: Secondary | ICD-10-CM | POA: Diagnosis not present

## 2021-01-21 DIAGNOSIS — R944 Abnormal results of kidney function studies: Secondary | ICD-10-CM | POA: Diagnosis not present

## 2021-02-05 DIAGNOSIS — G4733 Obstructive sleep apnea (adult) (pediatric): Secondary | ICD-10-CM | POA: Diagnosis not present

## 2021-02-05 DIAGNOSIS — I10 Essential (primary) hypertension: Secondary | ICD-10-CM | POA: Diagnosis not present

## 2021-02-20 DIAGNOSIS — R5383 Other fatigue: Secondary | ICD-10-CM | POA: Diagnosis not present

## 2021-02-20 DIAGNOSIS — G4733 Obstructive sleep apnea (adult) (pediatric): Secondary | ICD-10-CM | POA: Diagnosis not present

## 2021-02-20 DIAGNOSIS — F32A Depression, unspecified: Secondary | ICD-10-CM | POA: Diagnosis not present

## 2021-02-20 DIAGNOSIS — R0681 Apnea, not elsewhere classified: Secondary | ICD-10-CM | POA: Diagnosis not present

## 2021-02-20 DIAGNOSIS — R0683 Snoring: Secondary | ICD-10-CM | POA: Diagnosis not present

## 2021-03-20 DIAGNOSIS — F32A Depression, unspecified: Secondary | ICD-10-CM | POA: Diagnosis not present

## 2021-03-20 DIAGNOSIS — R0683 Snoring: Secondary | ICD-10-CM | POA: Diagnosis not present

## 2021-03-20 DIAGNOSIS — R5383 Other fatigue: Secondary | ICD-10-CM | POA: Diagnosis not present

## 2021-03-20 DIAGNOSIS — G4733 Obstructive sleep apnea (adult) (pediatric): Secondary | ICD-10-CM | POA: Diagnosis not present

## 2021-03-20 DIAGNOSIS — R0681 Apnea, not elsewhere classified: Secondary | ICD-10-CM | POA: Diagnosis not present

## 2021-04-05 DIAGNOSIS — E7849 Other hyperlipidemia: Secondary | ICD-10-CM | POA: Diagnosis not present

## 2021-04-05 DIAGNOSIS — R203 Hyperesthesia: Secondary | ICD-10-CM | POA: Diagnosis not present

## 2021-04-05 DIAGNOSIS — R5383 Other fatigue: Secondary | ICD-10-CM | POA: Diagnosis not present

## 2021-04-05 DIAGNOSIS — G629 Polyneuropathy, unspecified: Secondary | ICD-10-CM | POA: Diagnosis not present

## 2021-04-05 DIAGNOSIS — R739 Hyperglycemia, unspecified: Secondary | ICD-10-CM | POA: Diagnosis not present

## 2021-04-05 DIAGNOSIS — E559 Vitamin D deficiency, unspecified: Secondary | ICD-10-CM | POA: Diagnosis not present

## 2021-04-05 DIAGNOSIS — I10 Essential (primary) hypertension: Secondary | ICD-10-CM | POA: Diagnosis not present

## 2021-04-10 DIAGNOSIS — I10 Essential (primary) hypertension: Secondary | ICD-10-CM | POA: Diagnosis not present

## 2021-04-10 DIAGNOSIS — E559 Vitamin D deficiency, unspecified: Secondary | ICD-10-CM | POA: Diagnosis not present

## 2021-04-10 DIAGNOSIS — R5383 Other fatigue: Secondary | ICD-10-CM | POA: Diagnosis not present

## 2021-04-10 DIAGNOSIS — N183 Chronic kidney disease, stage 3 unspecified: Secondary | ICD-10-CM | POA: Diagnosis not present

## 2021-04-10 DIAGNOSIS — Z0001 Encounter for general adult medical examination with abnormal findings: Secondary | ICD-10-CM | POA: Diagnosis not present

## 2021-04-10 DIAGNOSIS — Z1389 Encounter for screening for other disorder: Secondary | ICD-10-CM | POA: Diagnosis not present

## 2021-04-10 DIAGNOSIS — M797 Fibromyalgia: Secondary | ICD-10-CM | POA: Diagnosis not present

## 2021-04-12 DIAGNOSIS — I10 Essential (primary) hypertension: Secondary | ICD-10-CM | POA: Diagnosis not present

## 2021-04-12 DIAGNOSIS — E7849 Other hyperlipidemia: Secondary | ICD-10-CM | POA: Diagnosis not present

## 2021-04-20 DIAGNOSIS — R0681 Apnea, not elsewhere classified: Secondary | ICD-10-CM | POA: Diagnosis not present

## 2021-04-20 DIAGNOSIS — R0683 Snoring: Secondary | ICD-10-CM | POA: Diagnosis not present

## 2021-04-20 DIAGNOSIS — R5383 Other fatigue: Secondary | ICD-10-CM | POA: Diagnosis not present

## 2021-04-20 DIAGNOSIS — G4733 Obstructive sleep apnea (adult) (pediatric): Secondary | ICD-10-CM | POA: Diagnosis not present

## 2021-04-20 DIAGNOSIS — F32A Depression, unspecified: Secondary | ICD-10-CM | POA: Diagnosis not present

## 2021-05-06 DIAGNOSIS — M1712 Unilateral primary osteoarthritis, left knee: Secondary | ICD-10-CM | POA: Diagnosis not present

## 2021-05-14 DIAGNOSIS — M797 Fibromyalgia: Secondary | ICD-10-CM | POA: Diagnosis not present

## 2021-05-14 DIAGNOSIS — E7849 Other hyperlipidemia: Secondary | ICD-10-CM | POA: Diagnosis not present

## 2021-05-14 DIAGNOSIS — I10 Essential (primary) hypertension: Secondary | ICD-10-CM | POA: Diagnosis not present

## 2021-05-14 DIAGNOSIS — Z01810 Encounter for preprocedural cardiovascular examination: Secondary | ICD-10-CM | POA: Diagnosis not present

## 2021-05-14 DIAGNOSIS — N183 Chronic kidney disease, stage 3 unspecified: Secondary | ICD-10-CM | POA: Diagnosis not present

## 2021-05-14 DIAGNOSIS — M1712 Unilateral primary osteoarthritis, left knee: Secondary | ICD-10-CM | POA: Diagnosis not present

## 2021-05-14 DIAGNOSIS — G629 Polyneuropathy, unspecified: Secondary | ICD-10-CM | POA: Diagnosis not present

## 2021-05-21 DIAGNOSIS — G4733 Obstructive sleep apnea (adult) (pediatric): Secondary | ICD-10-CM | POA: Diagnosis not present

## 2021-05-21 DIAGNOSIS — I10 Essential (primary) hypertension: Secondary | ICD-10-CM | POA: Diagnosis not present

## 2021-05-21 NOTE — Progress Notes (Signed)
?Cardiology Office Note:   ? ?Date:  05/23/2021  ? ?ID:  [name], DOB 08-14-1950, MRN [id number] ? ?PCP:  Curlene Labrum, MD ?  ?[facility] Providers ?Cardiologist:  Fransico Him, MD    ? ?Referring MD: Curlene Labrum, MD  ? ?Pre op clearance prior to knee replacement ? ?History of Present Illness:   ? ?[name] is a 71 y.o. female with a hx of anxiety, depression, fibromyalgia, HTN, HLD, OSA on CPAP and fatigue who presents to clinic for follow up and pre operative clearance.  ? ?She was seen by her PCP for evaluation of fatigue and chest pain. Nuclear stress testing was ordered which was high risk with a large region of moderate reversibility in the anterior wall, anteroseptum and anterolateral walls concerning for anterior ischemia.  LVF was normal with mild AVSC, mild LAR, trivial MR and G1DD.  Reports availble for review in CareEverywhere and done 10/2019. ? ?She was referred to Dr. Radford Pax for evaluation in 11/2019. Cardiac CT was ordered which showed a calcium score of zero with no evidence of CAD. ? ?Today the patient presents to clinic for follow up. Right now she is in a lot of pain all over her body from fibromyalgia. Heating pads help this. She stays active around the house. She has been drinking electrolyte water and working on diet and lost about 20 lbs. [facility]. Has some mild LE edema, more on left which is bad knee. Has leg and back pain that she hopes is related to the knee. She has had several back surgeries by Dr. Joseph Art. No chest pain. She does get shortness of breath when she gets in a hurry. No CP or SOB. No orthopnea or PND. Has chronic dizziness but syncope.  Has a long hx of vertigo. No blood in stool or urine. No palpitations.  ?  ? ? ?Past Medical History:  ?Diagnosis Date  ? Anxiety   ? Arthritis   ? hands, feet  ? Back pain   ? chronic  ? Cataracts, bilateral   ? MD just watching right now  ? Cystitides, interstitial, chronic    ? Depression   ? Fibromyalgia   ? GERD (gastroesophageal reflux disease)   ? Gout   ? feet  ? High cholesterol   ? Hypertension   ? Shortness of breath   ? with exertion  ? Sleep apnea   ? Uses appliance; used CPAP   ? SVD (spontaneous vaginal delivery)   ? x 3  ? Urinary disorder   ? bladder is in sling per pt  ? Urinary tract infection   ? Vertigo   ? Wears glasses   ? ? ?Past Surgical History:  ?Procedure Laterality Date  ? ABDOMINAL HYSTERECTOMY  1997  ? [place] [facility]  ? ANTERIOR LAT LUMBAR FUSION Right 04/28/2018  ? Procedure: ANTERIOR LATERAL INNER BODY LUMBAR FUSION L3-4, L4-5 2 LEVELS WITH INSTRUMENTATION AND ALLOGRAFT;  Surgeon: Phylliss Bob, MD;  Location: [place];  Service: Orthopedics;  Laterality: Right;  ? BACK SURGERY  10/20/2017  ? bladder tack    ? COLONOSCOPY N/A 08/23/2014  ? Procedure: COLONOSCOPY;  Surgeon: Rogene Houston, MD;  [facility];  Service: Endoscopy;  Laterality: N/A;  1200  ? CYSTO WITH HYDRODISTENSION N/A 04/18/2019  ? Procedure: CYSTOSCOPY/HYDRODISTENSION;  Surgeon: Cleon Gustin, MD;  Location: [facility];  Service: Urology;  Laterality: N/A;  ? KNEE SURGERY  Left 05/26/14  ? meniscus tear  ? LAPAROSCOPIC SALPINGO OOPHERECTOMY  02/17/2012  ? Procedure: LAPAROSCOPIC SALPINGO OOPHORECTOMY;  Surgeon: Jonnie Kind, MD;  Location: [facility];  Service: Gynecology;  Laterality: Bilateral;  ? LASIK Bilateral   ? TOE SURGERY    ? UPPER GI ENDOSCOPY    ? ? ?Current Medications: ?Current Meds  ?Medication Sig  ? ALPRAZolam (XANAX) 0.5 MG tablet Take 0.5 mg by mouth 2 (two) times daily.   ? amLODipine (NORVASC) 5 MG tablet Take 5 mg by mouth daily.  ? CAMPHOR-MENTHOL EX Apply 1 application topically 3 (three) times daily as needed (for pain).   ? carvedilol (COREG) 25 MG tablet TAKE ONE TABLET BY MOUTH TWICE DAILY  ? chlorthalidone (HYGROTON) 25 MG tablet TAKE ONE TABLET BY MOUTH EVERY MORNING  ? Cyanocobalamin (VITAMIN B-12 PO) Take 1 tablet by mouth daily.  ? DULoxetine  (CYMBALTA) 60 MG capsule Take 1 capsule (60 mg total) by mouth 2 (two) times daily.  ? fluticasone (FLONASE) 50 MCG/ACT nasal spray Place 1 spray into both nostrils daily as needed for allergies.   ? meclizine (ANTIVERT) 25 MG tablet TAKE 1 TABLET BY MOUTH EVERY 6 HOURS AS NEEDED FOR DIZZINESS  ? Menthol, Topical Analgesic, (MUSCLE & JOINT EX) Apply 1 application topically 3 (three) times daily as needed (pain).   ? Naphazoline-Pheniramine (OPCON-A OP) Place 1 drop into both eyes 3 (three) times daily as needed (dry/irritated eyes.).  ? olmesartan (BENICAR) 40 MG tablet Take 1 tablet (40 mg total) by mouth daily.  ? Omega-3 Fatty Acids (FISH OIL) 1000 MG CAPS Take by mouth.  ? OVER THE COUNTER MEDICATION Take 2 tablets by mouth daily. GOLI Gummies  ? Probiotic Product (PROBIOTIC DAILY PO) Take 1 capsule by mouth daily.   ? simvastatin (ZOCOR) 20 MG tablet Take 10 mg by mouth at bedtime.   ? VITAMIN D PO Take 1 tablet by mouth daily.  ?  ? ?Allergies:   Patient has no known allergies.  ? ?Social History  ? ?Socioeconomic History  ? Marital status: Married  ?  Spouse name: [name]  ? Number of children: Not on file  ? Years of education: college  ? Highest education level: Not on file  ?Occupational History  ?  Employer: UNEMPLOYED  ?Tobacco Use  ? Smoking status: Never  ? Smokeless tobacco: Never  ?Vaping Use  ? Vaping Use: Never used  ?Substance and Sexual Activity  ? Alcohol use: Not Currently  ?  Alcohol/week: 0.0 standard drinks  ?  Comment: occassional, but patient states "currently does not drink"  ? Drug use: No  ? Sexual activity: Yes  ?  Birth control/protection: Surgical, Post-menopausal  ?  Comment: hysterectomy  ?Other Topics Concern  ? Not on file  ?Social History Narrative  ? Patient lives at home with her family.  ? Caffeine Use: 1 cup daily  ? ?Social Determinants of Health  ? ?Financial Resource Strain: Not on file  ?Food Insecurity: Not on file  ?Transportation Needs: Not on file  ?Physical Activity:  Not on file  ?Stress: Not on file  ?Social Connections: Not on file  ?  ? ?Family History: ?The patient's family history includes Alcohol abuse in her brother, brother, and son; Atrial fibrillation in her mother; COPD in her mother; Cancer in her father; Drug abuse in her brother and brother; Early death in her brother; Fibromyalgia in her daughter; Hypertension in her brother, father, and mother; Paranoid behavior in her son;  Sleep apnea in her brother. ? ?ROS:   ?Please see the history of present illness.    ? All other systems reviewed and are negative. ? ?EKGs/Labs/Other Studies Reviewed:   ? ?The following studies were reviewed today: ? ?Nuclear stress test 10/2019 ?Impression ? ?1. Large region of moderate reversibility in the anterior wall  ?concerning for reversible ischemia..  ? ?2. Normal left ventricular wall motion.  ? ?3. Left ventricular ejection fraction 62%  ? ?4. Non invasive risk stratification*: High  ? ?*2012 Appropriate Use Criteria for Coronary  ? ?_____________________ ? ?Echo 11/08/19 ?Summary  ?  1. The left ventricle is normal in size with mildly increased wall  ?thickness.  ?  2. The left ventricular systolic function is normal, LVEF is visually  ?estimated at 65-70%.  ?  3. There is grade I diastolic dysfunction (impaired relaxation).  ?  4. The left atrium is mildly dilated in size.  ?  5. The right ventricle is normal in size, with normal systolic function.  ?  6. No significant valvular dysfunction.  ? ? ?______________________ ? ?Cardiac CT 11/2019 ?IMPRESSION: ?1. Coronary calcium score of 0. This was 0 percentile for age and ?sex matched control. ?  ?2.  Normal coronary origin with right dominance. ?  ?3.  No evidence of CAD.  CAD RADS 0. ?  ?4.  Consider no atherosclerotic causes of chest pain. ?  ?Traci Turner ?  ?  ? ?EKG:  EKG is ordered today.  The ekg ordered today demonstrates sinus brady with 1st Deg AV block, HR 59 ? ?Recent Labs: ?No results found for requested labs within  last 8760 hours.  ?Recent Lipid Panel ?   ?Component Value Date/Time  ? CHOL (H) 03/06/2010 0444  ?  214        ?ATP III CLASSIFICATION: ? <200     mg/dL   Desirable ? 200-239  mg/dL   Borderline High

## 2021-05-23 ENCOUNTER — Ambulatory Visit: Payer: Medicare Other | Admitting: Physician Assistant

## 2021-05-23 ENCOUNTER — Encounter: Payer: Self-pay | Admitting: Physician Assistant

## 2021-05-23 VITALS — BP 124/70 | HR 59 | Ht 65.6 in | Wt 195.8 lb

## 2021-05-23 DIAGNOSIS — I10 Essential (primary) hypertension: Secondary | ICD-10-CM

## 2021-05-23 DIAGNOSIS — E7800 Pure hypercholesterolemia, unspecified: Secondary | ICD-10-CM

## 2021-05-23 DIAGNOSIS — Z789 Other specified health status: Secondary | ICD-10-CM | POA: Diagnosis not present

## 2021-05-23 DIAGNOSIS — E669 Obesity, unspecified: Secondary | ICD-10-CM

## 2021-05-23 NOTE — Patient Instructions (Signed)

## 2021-05-27 DIAGNOSIS — J0100 Acute maxillary sinusitis, unspecified: Secondary | ICD-10-CM | POA: Diagnosis not present

## 2021-06-06 ENCOUNTER — Telehealth: Payer: Self-pay | Admitting: *Deleted

## 2021-06-06 NOTE — Telephone Encounter (Signed)
   Pre-operative Risk Assessment    Patient Name: [name]  DOB: 1950-11-22 MRN: [id number]      Request for Surgical Clearance    Procedure:   LEFT TOTAL KNEE REPLACEMENT  Date of Surgery:  Clearance TBD                                  Surgeon:  DR. Marchia Bond Surgeon's Group or Practice Name:  [facility] Phone number:  [phone] EXT [address] Fax number:  [phone]   Type of Clearance Requested:   - Medical ; NO MEDICATION REQUESTED TO BE HELD   Type of Anesthesia:   CHOICE   Additional requests/questions:    [name]   06/06/2021, 5:58 PM

## 2021-06-07 NOTE — Telephone Encounter (Signed)
   Primary Cardiologist: Fransico Him, MD  Chart reviewed as part of pre-operative protocol coverage. Given past medical history and time since last visit, based on ACC/AHA guidelines, [name] would be at acceptable risk for the planned procedure without further cardiovascular testing.   Her RCRI is a class II risk, 0.9% risk of major cardiac event.  I will route this recommendation to the requesting party via Epic fax function and remove from pre-op pool.  Please call with questions.  Jossie Ng. Renan Danese NP-C    06/07/2021, 8:50 AM [address] Office [phone] Fax [phone]

## 2021-06-24 DIAGNOSIS — M1712 Unilateral primary osteoarthritis, left knee: Secondary | ICD-10-CM | POA: Diagnosis not present

## 2021-06-25 NOTE — H&P (Addendum)
KNEE ARTHROPLASTY ADMISSION H&P  Patient ID: [name] MRN: [id number] DOB/AGE: 73-15-1952 71 y.o.  Chief Complaint: left knee pain.  Planned Procedure Date: 07/09/21 Medical Clearance by Dr. Pleas Koch Cardiac Clearance by Coletta Memos, NP   HPI: [name] is a 71 y.o. female who presents for evaluation of djd left knee. The patient has a history of pain and functional disability in the left knee due to arthritis and has failed non-surgical conservative treatments for greater than 12 weeks to include NSAID's and/or analgesics, corticosteriod injections, viscosupplementation injections, and activity modification.  Onset of symptoms was gradual, starting 4 years ago with gradually worsening course since that time. The patient noted prior procedures on the knee to include  arthroscopy and menisectomy on the left knee.  Patient currently rates pain at 4 out of 10 with activity. Patient has worsening of pain with activity and weight bearing and pain that interferes with activities of daily living.  Patient has evidence of joint space narrowing by imaging studies.  There is no active infection.  Past Medical History:  Diagnosis Date   Anxiety    Arthritis    hands, feet   Back pain    chronic   Cataracts, bilateral    MD just watching right now   Cystitides, interstitial, chronic    Depression    Fibromyalgia    GERD (gastroesophageal reflux disease)    Gout    feet   High cholesterol    Hypertension    Shortness of breath    with exertion   Sleep apnea    Uses appliance; used CPAP    SVD (spontaneous vaginal delivery)    x 3   Urinary disorder    bladder is in sling per pt   Urinary tract infection    Vertigo    Wears glasses    Past Surgical History:  Procedure Laterality Date   ABDOMINAL HYSTERECTOMY  1997   [place] [facility]   ANTERIOR LAT LUMBAR FUSION Right 04/28/2018   Procedure: ANTERIOR LATERAL INNER BODY LUMBAR FUSION L3-4, L4-5 2 LEVELS WITH  INSTRUMENTATION AND ALLOGRAFT;  Surgeon: Phylliss Bob, MD;  Location: [facility];  Service: Orthopedics;  Laterality: Right;   BACK SURGERY  10/20/2017   bladder tack     COLONOSCOPY N/A 08/23/2014   Procedure: COLONOSCOPY;  Surgeon: Rogene Houston, MD;  [facility];  Service: Endoscopy;  Laterality: N/A;  1200   CYSTO WITH HYDRODISTENSION N/A 04/18/2019   Procedure: CYSTOSCOPY/HYDRODISTENSION;  Surgeon: Cleon Gustin, MD;  [facility];  Service: Urology;  Laterality: N/A;   KNEE SURGERY Left 05/26/14   meniscus tear   LAPAROSCOPIC SALPINGO OOPHERECTOMY  02/17/2012   Procedure: LAPAROSCOPIC SALPINGO OOPHORECTOMY;  Surgeon: Jonnie Kind, MD;  Location[facility];  Service: Gynecology;  Laterality: Bilateral;   LASIK Bilateral    TOE SURGERY     UPPER GI ENDOSCOPY     No Known Allergies Prior to Admission medications   Medication Sig Start Date End Date Taking? Authorizing Provider  ALPRAZolam Duanne Moron) 0.5 MG tablet Take 0.5 mg by mouth 2 (two) times daily.  08/13/17   [provider]  amLODipine (NORVASC) 5 MG tablet Take 5 mg by mouth daily. 04/09/21   [provider]  CAMPHOR-MENTHOL EX Apply 1 application topically 3 (three) times daily as needed (for pain).     [provider]  carvedilol (COREG) 25 MG tablet TAKE ONE TABLET BY MOUTH TWICE DAILY 09/20/20   Radford Pax,  Eber Hong, MD  chlorthalidone (HYGROTON) 25 MG tablet TAKE ONE TABLET BY MOUTH EVERY MORNING 09/20/20   Sueanne Margarita, MD  Cyanocobalamin (VITAMIN B-12 PO) Take 1 tablet by mouth daily.    [provider]  DULoxetine (CYMBALTA) 60 MG capsule Take 1 capsule (60 mg total) by mouth 2 (two) times daily. 08/01/16   Cloria Spring, MD  fluticasone (FLONASE) 50 MCG/ACT nasal spray Place 1 spray into both nostrils daily as needed for allergies.     [provider]  meclizine (ANTIVERT) 25 MG tablet TAKE 1 TABLET BY MOUTH EVERY 6 HOURS AS NEEDED FOR DIZZINESS 08/15/19   [provider]  Menthol, Topical Analgesic, (MUSCLE & JOINT EX) Apply 1 application topically 3 (three) times daily as needed (pain).     [provider]  Naphazoline-Pheniramine (OPCON-A OP) Place 1 drop into both eyes 3 (three) times daily as needed (dry/irritated eyes.).    [provider]  olmesartan (BENICAR) 40 MG tablet Take 1 tablet (40 mg total) by mouth daily. 09/10/20   Sueanne Margarita, MD  Omega-3 Fatty Acids (FISH OIL) 1000 MG CAPS Take by mouth.    [provider]  OVER THE COUNTER MEDICATION Take 2 tablets by mouth daily. [place]    [provider]  polyethylene glycol (MIRALAX) 17 g packet Take 17 g by mouth daily. Dissolve one cap full in solution (water, gatorade, etc.) and administer once cap-full daily. You may titrate up daily by 1 cap-full until the patient is having pudding consistency of stools. After the patient is able to start passing softer stools they they can continue to take 1/2 cap-full daily for 2 weeks. Patient not taking: Reported on 05/23/2021 05/11/19   Couture, Cortni S, PA-C  Probiotic Product (PROBIOTIC DAILY PO) Take 1 capsule by mouth daily.     [provider]  simvastatin (ZOCOR) 20 MG tablet Take 10 mg by mouth at bedtime.     [provider]  VITAMIN D PO Take 1 tablet by mouth daily.    [provider]   Social History   Socioeconomic History   Marital status: Married    Spouse name: Jeneen Rinks   Number of children: Not on file   Years of education: college   Highest education level: Not on file  Occupational History    Employer: UNEMPLOYED  Tobacco Use   Smoking status: Never   Smokeless tobacco: Never  Vaping Use   Vaping Use: Never used  Substance and Sexual Activity   Alcohol use: Not Currently    Alcohol/week: 0.0 standard drinks of alcohol    Comment: occassional, but patient states "currently does not drink"   Drug use: No   Sexual activity: Yes    Birth  control/protection: Surgical, Post-menopausal    Comment: hysterectomy  Other Topics Concern   Not on file  Social History Narrative   Patient lives at home with her family.   Caffeine Use: 1 cup daily   Social Determinants of Health   Financial Resource Strain: Not on file  Food Insecurity: Not on file  Transportation Needs: Not on file  Physical Activity: Not on file  Stress: Not on file  Social Connections: Not on file   Family History  Problem Relation Age of Onset   Atrial fibrillation Mother    COPD Mother    Hypertension Mother    Hypertension Father    Cancer Father        throat  Drug abuse Brother    Alcohol abuse Brother    Early death Brother    Fibromyalgia Daughter    Paranoid behavior Son    Alcohol abuse Son    Hypertension Brother    Sleep apnea Brother    Alcohol abuse Brother    Drug abuse Brother     ROS: Currently denies lightheadedness, dizziness, Fever, chills, CP, SOB.   No personal history of DVT, PE, MI, or CVA. No loose teeth or dentures All other systems have been reviewed and were otherwise currently negative with the exception of those mentioned in the HPI and as above.  Objective: Vitals: Ht: 5' 4.5" Wt: 193 lbs Temp: 98.9 BP: 142/83 Pulse: 70 O2 97% on room air.   Physical Exam: General: Alert, NAD.  Antalgic Gait  HEENT: EOMI, Good Neck Extension  Pulm: No increased work of breathing.  Clear B/L A/P w/o crackle or wheeze.  CV: RRR, No m/g/r appreciated  GI: soft, NT, ND Neuro: Neuro without gross focal deficit.  Sensation intact distally Skin: No lesions in the area of chief complaint MSK/Surgical Site: left knee w/o redness or effusion.  no JLT. ROM 0-120.  5/5 strength in extension and flexion.  +EHL/FHL.  NVI.  Stable varus and valgus stress.    Imaging Review Plain radiographs demonstrate severe degenerative joint disease of the left knee.   The overall alignment is varus. The bone quality appears to be adequate for age  and reported activity level.  Preoperative templating of the joint replacement has been completed, documented, and submitted to the Operating Room personnel in order to optimize intra-operative equipment management.  Assessment: djd left knee   Plan: Plan for Procedure(s): TOTAL KNEE ARTHROPLASTY  The patient history, physical exam, clinical judgement of the provider and imaging are consistent with end stage degenerative joint disease and total joint arthroplasty is deemed medically necessary. The treatment options including medical management, injection therapy, and arthroplasty were discussed at length. The risks and benefits of Procedure(s): TOTAL KNEE ARTHROPLASTY were presented and reviewed.  The risks of nonoperative treatment, versus surgical intervention including but not limited to continued pain, aseptic loosening, stiffness, dislocation/subluxation, infection, bleeding, nerve injury, blood clots, cardiopulmonary complications, morbidity, mortality, among others were discussed. The patient verbalizes understanding and wishes to proceed with the plan.  Patient is being admitted for inpatient treatment for surgery, pain control, PT, prophylactic antibiotics, VTE prophylaxis, progressive ambulation, ADL's and discharge planning.   Dental prophylaxis discussed and recommended for 2 years postoperatively.  The patient does meet the criteria for TXA which will be used perioperatively.   ASA 325 mg will be used postoperatively for DVT prophylaxis in addition to SCDs, and early ambulation. Plan for oxycodone for pain.  Baclofen for spasm. Ondansetron for nausea. The patient is planning to be discharged home with OPPT in care of her husband    Ventura Bruns, Hershal Coria 06/25/2021 8:46 AM

## 2021-06-27 NOTE — Progress Notes (Signed)
Anesthesia Review:  PCP: Remo Lipps burdine LOV 04/10/21  Cardiologist : Angelena Form LOV 05/23/21  Cardiology clearance in teleophone encounter 06/06/21.  Chest x-ray : EKG : 05/23/21  2021- CT cors  01/23/20- Sleep Study  Echo : Stress test: Cardiac Cath :  Activity level:  Sleep Study/ CPAP : Fasting Blood Sugar :      / Checks Blood Sugar -- times a day:   Blood Thinner/ Instructions /Last Dose: ASA / Instructions/ Last Dose :

## 2021-06-27 NOTE — Progress Notes (Signed)
DUE TO COVID-19 ONLY  2  VISITOR IS ALLOWED TO COME WITH YOU AND STAY IN THE WAITING ROOM ONLY DURING PRE OP AND PROCEDURE DAY OF SURGERY.  4  VISITOR  MAY VISIT WITH YOU AFTER SURGERY IN YOUR PRIVATE ROOM DURING VISITING HOURS ONLY! YOU MAY HAVE ONE PERSON SPEND THE NITE WITH YOU IN YOUR ROOM AFTER SURGERY.      Your procedure is scheduled on:        07/09/21   Report to [facility] Main  Entrance   Report to admitting at       0515         AM DO NOT [place], PICTURE ID OR WALLET DAY OF SURGERY.      Call this number if you have problems the morning of surgery [phone]    REMEMBER: NO  SOLID FOODS , CANDY, GUM OR MINTS AFTER [place] .       Marland Kitchen CLEAR LIQUIDS UNTIL        0430am         DAY OF SURGERY.      PLEASE FINISH ENSURE DRINK PER SURGEON ORDER  WHICH NEEDS TO BE COMPLETED AT     0430am     MORNING OF SURGERY.       CLEAR LIQUID DIET   Foods Allowed      WATER BLACK COFFEE ( SUGAR OK, NO MILK, CREAM OR CREAMER) REGULAR AND DECAF  TEA ( SUGAR OK NO MILK, CREAM, OR CREAMER) REGULAR AND DECAF  PLAIN JELLO ( NO RED)  FRUIT ICES ( NO RED, NO FRUIT PULP)  POPSICLES ( NO RED)  JUICE- APPLE, WHITE GRAPE AND WHITE CRANBERRY  SPORT DRINK LIKE GATORADE ( NO RED)  CLEAR BROTH ( VEGETABLE , CHICKEN OR BEEF)                                                                     BRUSH YOUR TEETH MORNING OF SURGERY AND RINSE YOUR MOUTH OUT, NO CHEWING GUM CANDY OR MINTS.     Take these medicines the morning of surgery with A SIP OF WATER:  amlodiopne, coreg, cymbalta    DO NOT TAKE ANY DIABETIC MEDICATIONS DAY OF YOUR SURGERY                               You may not have any metal on your body including hair pins and              piercings  Do not wear jewelry, make-up, lotions, powders or perfumes, deodorant             Do not wear nail polish on your fingernails.              IF YOU ARE A FEMALE AND WANT TO SHAVE UNDER ARMS OR LEGS  PRIOR TO SURGERY YOU MUST DO SO AT LEAST 48 HOURS PRIOR TO SURGERY.              Men may shave face and neck.   Do not bring valuables to the hospital. [facility] IS NOT  RESPONSIBLE   FOR VALUABLES.  Contacts, dentures or bridgework may not be worn into surgery.  Leave suitcase in the car. After surgery it may be brought to your room.     Patients discharged the day of surgery will not be allowed to drive home. IF YOU ARE HAVING SURGERY AND GOING HOME THE SAME DAY, YOU MUST HAVE AN ADULT TO DRIVE YOU HOME AND BE WITH YOU FOR 24 HOURS. YOU MAY GO HOME BY TAXI OR UBER OR ORTHERWISE, BUT AN ADULT MUST ACCOMPANY YOU HOME AND STAY WITH YOU FOR 24 HOURS.                Please read over the following fact sheets you were given: _____________________________________________________________________  [facility] - Preparing for Surgery Before surgery, you can play an important role.  Because skin is not sterile, your skin needs to be as free of germs as possible.  You can reduce the number of germs on your skin by washing with CHG (chlorahexidine gluconate) soap before surgery.  CHG is an antiseptic cleaner which kills germs and bonds with the skin to continue killing germs even after washing. Please DO NOT use if you have an allergy to CHG or antibacterial soaps.  If your skin becomes reddened/irritated stop using the CHG and inform your nurse when you arrive at Short Stay. Do not shave (including legs and underarms) for at least 48 hours prior to the first CHG shower.  You may shave your face/neck. Please follow these instructions carefully:  1.  Shower with CHG Soap the night before surgery and the  morning of Surgery.  2.  If you choose to wash your hair, wash your hair first as usual with your  normal  shampoo.  3.  After you shampoo, rinse your hair and body thoroughly to remove the  shampoo.                           4.  Use CHG as you would any other liquid soap.  You can apply chg  directly  to the skin and wash                       Gently with a scrungie or clean washcloth.  5.  Apply the CHG Soap to your body ONLY FROM THE NECK DOWN.   Do not use on face/ open                           Wound or open sores. Avoid contact with eyes, ears mouth and genitals (private parts).                       Wash face,  Genitals (private parts) with your normal soap.             6.  Wash thoroughly, paying special attention to the area where your surgery  will be performed.  7.  Thoroughly rinse your body with warm water from the neck down.  8.  DO NOT shower/wash with your normal soap after using and rinsing off  the CHG Soap.                9.  Pat yourself dry with a clean towel.            10.  Wear clean pajamas.  11.  Place clean sheets on your bed the night of your first shower and do not  sleep with pets. Day of Surgery : Do not apply any lotions/deodorants the morning of surgery.  Please wear clean clothes to the hospital/surgery center.  FAILURE TO FOLLOW THESE INSTRUCTIONS MAY RESULT IN THE CANCELLATION OF YOUR SURGERY PATIENT SIGNATURE_________________________________  NURSE SIGNATURE__________________________________  ________________________________________________________________________

## 2021-07-02 ENCOUNTER — Other Ambulatory Visit: Payer: Self-pay

## 2021-07-02 ENCOUNTER — Encounter (HOSPITAL_COMMUNITY)
Admission: RE | Admit: 2021-07-02 | Discharge: 2021-07-02 | Disposition: A | Discharge status: Home / Self Care | Payer: Medicare Other | Source: Ambulatory Visit | Attending: Orthopedic Surgery | Admitting: Orthopedic Surgery

## 2021-07-02 ENCOUNTER — Encounter (HOSPITAL_COMMUNITY): Payer: Self-pay

## 2021-07-02 DIAGNOSIS — Z01812 Encounter for preprocedural laboratory examination: Secondary | ICD-10-CM | POA: Diagnosis not present

## 2021-07-02 DIAGNOSIS — Z01818 Encounter for other preprocedural examination: Secondary | ICD-10-CM

## 2021-07-02 HISTORY — DX: Candidiasis, unspecified: B37.9

## 2021-07-02 HISTORY — DX: Interstitial cystitis (chronic) without hematuria: N30.10

## 2021-07-02 HISTORY — DX: Polyneuropathy, unspecified: G62.9

## 2021-07-02 HISTORY — DX: Other complications of anesthesia, initial encounter: T88.59XA

## 2021-07-02 HISTORY — DX: Pneumonia, unspecified organism: J18.9

## 2021-07-02 HISTORY — DX: Other visual disturbances: H53.8

## 2021-07-02 LAB — BASIC METABOLIC PANEL
Anion gap: 9 (ref 5–15)
BUN: 15 mg/dL (ref 8–23)
CO2: 28 mmol/L (ref 22–32)
Calcium: 9.6 mg/dL (ref 8.9–10.3)
Chloride: 96 mmol/L — ABNORMAL LOW (ref 98–111)
Creatinine, Ser: 1.13 mg/dL — ABNORMAL HIGH (ref 0.44–1.00)
GFR, Estimated: 52 mL/min — ABNORMAL LOW (ref >60)
Glucose, Bld: 95 mg/dL (ref 70–99)
Potassium: 3.5 mmol/L (ref 3.5–5.1)
Sodium: 133 mmol/L — ABNORMAL LOW (ref 135–145)

## 2021-07-02 LAB — CBC
HCT: 36.6 % (ref 36.0–46.0)
Hemoglobin: 12.4 g/dL (ref 12.0–15.0)
MCH: 30 pg (ref 26.0–34.0)
MCHC: 33.9 g/dL (ref 30.0–36.0)
MCV: 88.4 fL (ref 80.0–100.0)
Platelets: 306 10*3/uL (ref 150–400)
RBC: 4.14 MIL/uL (ref 3.87–5.11)
RDW: 13.7 % (ref 11.5–15.5)
WBC: 6.8 10*3/uL (ref 4.0–10.5)
nRBC: 0 % (ref 0.0–0.2)

## 2021-07-02 LAB — SURGICAL PCR SCREEN
MRSA, PCR: NEGATIVE
Staphylococcus aureus: NEGATIVE

## 2021-07-02 NOTE — Discharge Instructions (Signed)

## 2021-07-02 NOTE — H&P (Cosign Needed Addendum)
KNEE ARTHROPLASTY ADMISSION H&P  Patient ID: [name] MRN: [id number] DOB/AGE: [birth year] 71 y.o.  Chief Complaint: left knee pain.  Planned Procedure Date: 07/09/21 Medical Clearance by Judd Lien Cardiac Clearance by Coletta Memos, NP  HPI: [name] is a 71 y.o. female who presents for evaluation of djd left knee. The patient has a history of pain and functional disability in the left knee due to arthritis and has failed non-surgical conservative treatments for greater than 12 weeks to include NSAID's and/or analgesics, corticosteriod injections, viscosupplementation injections, and activity modification.  Onset of symptoms was gradual, starting 4 years ago with gradually worsening course since that time. The patient noted history of 2 previous arthroscopies of the left knee.  Patient currently rates pain at 4 out of 10 with activity. Patient has night pain, worsening of pain with activity and weight bearing, and pain that interferes with activities of daily living.  Patient has evidence of joint space narrowing by imaging studies.  There is no active infection.  Past Medical History:  Diagnosis Date   Anxiety    Arthritis    hands, feet   Back pain    chronic   Blurred vision    hx of   Cataracts, bilateral    MD just watching right now   Complication of anesthesia    pt states at preop after back surgery she had to sit up Duarte nite ? related to sleep apnea pt not  sure   Cystitides, interstitial, chronic    Depression    Fibromyalgia    GERD (gastroesophageal reflux disease)    Gout    feet   High cholesterol    Hypertension    Interstitial cystitis    hx of   Neuropathy    bilateral feet   Pneumonia    Shortness of breath    with exertion   Sleep apnea    Uses appliance; used CPAP    SVD (spontaneous vaginal delivery)    x 3   Urinary disorder    bladder is in sling per pt   Urinary tract infection    Vertigo    Vertigo    hx of   Wears  glasses    Yeast infection    hx of multiple yeast infections per pt   Past Surgical History:  Procedure Laterality Date   ABDOMINAL HYSTERECTOMY  1997   [place] [facility]   ANTERIOR LAT LUMBAR FUSION Right 04/28/2018   Procedure: ANTERIOR LATERAL INNER BODY LUMBAR FUSION L3-4, L4-5 2 LEVELS WITH INSTRUMENTATION AND ALLOGRAFT;  Surgeon: Phylliss Bob, MD;  Location: [facility];  Service: Orthopedics;  Laterality: Right;   BACK SURGERY  10/20/2017   bladder tack     COLONOSCOPY N/A 08/23/2014   Procedure: COLONOSCOPY;  Surgeon: Rogene Houston, MD;  [facility];  Service: Endoscopy;  Laterality: N/A;  1200   CYSTO WITH HYDRODISTENSION N/A 04/18/2019   Procedure: CYSTOSCOPY/HYDRODISTENSION;  Surgeon: Cleon Gustin, MD;  Location: [facility];  Service: Urology;  Laterality: N/A;   KNEE SURGERY Left 05/26/14   meniscus tear   LAPAROSCOPIC SALPINGO OOPHERECTOMY  02/17/2012   Procedure: LAPAROSCOPIC SALPINGO OOPHORECTOMY;  Surgeon: Jonnie Kind, MD;  Location: [facility];  Service: Gynecology;  Laterality: Bilateral;   LASIK Bilateral    TOE SURGERY     UPPER GI ENDOSCOPY     No Known Allergies Prior to Admission medications   Medication Sig Start Date End Date Taking? Authorizing Provider  acetaminophen (TYLENOL) 500 MG tablet Take 500 mg by mouth every 6 (six) hours as needed for moderate pain.   Yes [provider]  ALPRAZolam (XANAX) 0.5 MG tablet Take 0.25-0.5 mg by mouth 2 (two) times daily. 08/13/17  Yes [provider]  amLODipine (NORVASC) 5 MG tablet Take 5 mg by mouth daily. 04/09/21  Yes [provider]  CAMPHOR-MENTHOL EX Apply 1 application topically 3 (three) times daily as needed (for pain).    Yes [provider]  carvedilol (COREG) 25 MG tablet TAKE ONE TABLET BY MOUTH TWICE DAILY 09/20/20  Yes Turner, Traci R, MD  chlorthalidone (HYGROTON) 25 MG tablet TAKE ONE TABLET BY MOUTH EVERY MORNING 09/20/20  Yes Turner, Eber Hong, MD  DULoxetine  (CYMBALTA) 60 MG capsule Take 1 capsule (60 mg total) by mouth 2 (two) times daily. 08/01/16  Yes Cloria Spring, MD  fluticasone [facility]) 50 MCG/ACT nasal spray Place 1 spray into both nostrils daily as needed for allergies.    Yes [provider]  meclizine (ANTIVERT) 25 MG tablet TAKE 1 TABLET BY MOUTH EVERY 6 HOURS AS NEEDED FOR DIZZINESS 08/15/19  Yes [provider]  Naphazoline-Pheniramine (OPCON-A OP) Place 1 drop into both eyes 3 (three) times daily as needed (dry/irritated eyes.).   Yes [provider]  NON FORMULARY Pt uses a cpap nightly   Yes [provider]  olmesartan (BENICAR) 40 MG tablet Take 1 tablet (40 mg total) by mouth daily. 09/10/20  Yes Turner, Eber Hong, MD  OVER THE COUNTER MEDICATION Take 2 tablets by mouth daily. GOLI Gummies   Yes [provider]  OVER THE COUNTER MEDICATION Take 1 Package by mouth in the morning and at bedtime. Ketones Electrolytes   Yes [provider]  Probiotic Product (PROBIOTIC DAILY PO) Take 1 capsule by mouth daily.    Yes [provider]  simvastatin (ZOCOR) 20 MG tablet Take 20 mg by mouth at bedtime.   Yes [provider]  VITAMIN D PO Take 1 tablet by mouth daily.   Yes [provider]  polyethylene glycol (MIRALAX) 17 g packet Take 17 g by mouth daily. Dissolve one cap full in solution (water, gatorade, etc.) and administer once cap-full daily. You may titrate up daily by 1 cap-full until the patient is having pudding consistency of stools. After the patient is able to start passing softer stools they they can continue to take 1/2 cap-full daily for 2 weeks. Patient not taking: Reported on 06/28/2021 05/11/19   Rodney Booze, PA-C   Social History   Socioeconomic History   Marital status: Married    Spouse name: Jeneen Rinks   Number of children: Not on file   Years of education: college   Highest education level: Not on file  Occupational History    Employer:  UNEMPLOYED  Tobacco Use   Smoking status: Never   Smokeless tobacco: Never  Vaping Use   Vaping Use: Never used  Substance and Sexual Activity   Alcohol use: Yes    Comment: rare   Drug use: No   Sexual activity: Yes    Birth control/protection: Surgical, Post-menopausal    Comment: hysterectomy  Other Topics Concern   Not on file  Social History Narrative   Patient lives at home with her family.   Caffeine Use: 1 cup daily   Social Determinants of Health   Financial Resource Strain: Not on file  Food Insecurity: Not on file  Transportation Needs: Not on  file  Physical Activity: Not on file  Stress: Not on file  Social Connections: Not on file   Family History  Problem Relation Age of Onset   Atrial fibrillation Mother    COPD Mother    Hypertension Mother    Hypertension Father    Cancer Father        throat    Drug abuse Brother    Alcohol abuse Brother    Early death Brother    Fibromyalgia Daughter    Paranoid behavior Son    Alcohol abuse Son    Hypertension Brother    Sleep apnea Brother    Alcohol abuse Brother    Drug abuse Brother     ROS: Currently denies lightheadedness, dizziness, Fever, chills, CP, SOB.   No personal history of DVT, PE, MI, or CVA. No loose teeth or dentures Duarte other systems have been reviewed and were otherwise currently negative with the exception of those mentioned in the HPI and as above.  Objective: Vitals: Ht: 5" 4.5' Wt: 193 lbs Temp: 98.9 BP: 142/83 Pulse: 70 O2 97% on room air.   Physical Exam: General: Alert, NAD.  Antalgic Gait  HEENT: EOMI, Good Neck Extension  Pulm: No increased work of breathing.  Clear B/L A/P w/o crackle or wheeze.  CV: RRR, No m/g/r appreciated  GI: soft, NT, ND Neuro: Neuro without gross focal deficit.  Sensation intact distally Skin: No lesions in the area of chief complaint MSK/Surgical Site: left knee w/o redness or effusion. She has 0-120 degrees of range of motion of the left knee.  Stable to varus and valgus stress. No medial or lateral joint line tenderness. 5/5 strength in extension and flexion.  +EHL/FHL.  NVI.  Stable varus and valgus stress.    Imaging Review Plain radiographs demonstrate severe degenerative joint disease of the left knee.   The overall alignment ismild valgus. The bone quality appears to be adequate for age and reported activity level.  Preoperative templating of the joint replacement has been completed, documented, and submitted to the Operating Room personnel in order to optimize intra-operative equipment management.  Assessment: djd left knee Active Problems:   Osteoarthritis of left knee   Plan: Plan for Procedure(s): TOTAL KNEE ARTHROPLASTY  The patient history, physical exam, clinical judgement of the provider and imaging are consistent with end stage degenerative joint disease and total joint arthroplasty is deemed medically necessary. The treatment options including medical management, injection therapy, and arthroplasty were discussed at length. The risks and benefits of Procedure(s): TOTAL KNEE ARTHROPLASTY were presented and reviewed.  The risks of nonoperative treatment, versus surgical intervention including but not limited to continued pain, aseptic loosening, stiffness, dislocation/subluxation, infection, bleeding, nerve injury, blood clots, cardiopulmonary complications, morbidity, mortality, among others were discussed. The patient verbalizes understanding and wishes to proceed with the plan.  Patient is being admitted for inpatient treatment for surgery, pain control, PT, prophylactic antibiotics, VTE prophylaxis, progressive ambulation, ADL's and discharge planning.   Dental prophylaxis discussed and recommended for 2 years postoperatively.  The patient does meet the criteria for TXA which will be used perioperatively.   ASA 325 mg  will be used postoperatively for DVT prophylaxis in addition to SCDs, and early  ambulation. The patient is planning to be discharged home with OPPT in care of her husband   Ventura Bruns, Hershal Coria 07/02/2021 1:44 PM

## 2021-07-08 ENCOUNTER — Other Ambulatory Visit: Payer: Self-pay | Admitting: Cardiology

## 2021-07-09 ENCOUNTER — Other Ambulatory Visit: Payer: Self-pay

## 2021-07-09 ENCOUNTER — Ambulatory Visit (HOSPITAL_COMMUNITY)
Admission: RE | Admit: 2021-07-09 | Discharge: 2021-07-09 | Disposition: A | Discharge status: Home / Self Care | Payer: Medicare Other | Source: Ambulatory Visit | Attending: Orthopedic Surgery | Admitting: Orthopedic Surgery

## 2021-07-09 ENCOUNTER — Encounter (HOSPITAL_COMMUNITY): Payer: Self-pay | Admitting: Orthopedic Surgery

## 2021-07-09 ENCOUNTER — Ambulatory Visit (HOSPITAL_COMMUNITY): Payer: Medicare Other

## 2021-07-09 ENCOUNTER — Encounter (HOSPITAL_COMMUNITY)
Admission: RE | Disposition: A | Discharge status: Home / Self Care | Payer: Self-pay | Source: Ambulatory Visit | Attending: Orthopedic Surgery

## 2021-07-09 ENCOUNTER — Ambulatory Visit (HOSPITAL_COMMUNITY): Payer: Medicare Other | Admitting: Physician Assistant

## 2021-07-09 ENCOUNTER — Ambulatory Visit (HOSPITAL_BASED_OUTPATIENT_CLINIC_OR_DEPARTMENT_OTHER): Payer: Medicare Other | Admitting: Anesthesiology

## 2021-07-09 DIAGNOSIS — K219 Gastro-esophageal reflux disease without esophagitis: Secondary | ICD-10-CM | POA: Diagnosis not present

## 2021-07-09 DIAGNOSIS — F32A Depression, unspecified: Secondary | ICD-10-CM | POA: Insufficient documentation

## 2021-07-09 DIAGNOSIS — M170 Bilateral primary osteoarthritis of knee: Secondary | ICD-10-CM | POA: Diagnosis not present

## 2021-07-09 DIAGNOSIS — Z79899 Other long term (current) drug therapy: Secondary | ICD-10-CM | POA: Insufficient documentation

## 2021-07-09 DIAGNOSIS — G4730 Sleep apnea, unspecified: Secondary | ICD-10-CM | POA: Diagnosis not present

## 2021-07-09 DIAGNOSIS — M1712 Unilateral primary osteoarthritis, left knee: Secondary | ICD-10-CM

## 2021-07-09 DIAGNOSIS — Z96652 Presence of left artificial knee joint: Secondary | ICD-10-CM | POA: Diagnosis not present

## 2021-07-09 DIAGNOSIS — I10 Essential (primary) hypertension: Secondary | ICD-10-CM | POA: Diagnosis not present

## 2021-07-09 DIAGNOSIS — G8918 Other acute postprocedural pain: Secondary | ICD-10-CM | POA: Diagnosis not present

## 2021-07-09 DIAGNOSIS — M797 Fibromyalgia: Secondary | ICD-10-CM | POA: Insufficient documentation

## 2021-07-09 DIAGNOSIS — Z01818 Encounter for other preprocedural examination: Secondary | ICD-10-CM

## 2021-07-09 DIAGNOSIS — F419 Anxiety disorder, unspecified: Secondary | ICD-10-CM | POA: Insufficient documentation

## 2021-07-09 HISTORY — PX: TOTAL KNEE ARTHROPLASTY: SHX125

## 2021-07-09 SURGERY — ARTHROPLASTY, KNEE, TOTAL
Anesthesia: General | Site: Knee | Laterality: Left

## 2021-07-09 MED ORDER — ROPIVACAINE HCL 5 MG/ML IJ SOLN
INTRAMUSCULAR | Status: DC | PRN
  Administered 2021-07-09: 30 mL via PERINEURAL

## 2021-07-09 MED ORDER — PROPOFOL 500 MG/50ML IV EMUL
INTRAVENOUS | Status: DC | PRN
  Administered 2021-07-09: 100 ug/kg/min via INTRAVENOUS

## 2021-07-09 MED ORDER — CEFAZOLIN SODIUM-DEXTROSE 2-4 GM/100ML-% IV SOLN
2.0000 g | INTRAVENOUS | Status: AC
  Administered 2021-07-09: 2 g via INTRAVENOUS
  Filled 2021-07-09: qty 100

## 2021-07-09 MED ORDER — ONDANSETRON HCL 4 MG/2ML IJ SOLN
INTRAMUSCULAR | Status: DC | PRN
  Administered 2021-07-09: 4 mg via INTRAVENOUS

## 2021-07-09 MED ORDER — POVIDONE-IODINE 10 % EX SWAB
2.0000 | Freq: Once | CUTANEOUS | Status: AC
  Administered 2021-07-09: 2 via TOPICAL

## 2021-07-09 MED ORDER — MIDAZOLAM HCL 2 MG/2ML IJ SOLN
INTRAMUSCULAR | Status: AC
  Filled 2021-07-09: qty 2

## 2021-07-09 MED ORDER — LACTATED RINGERS IV BOLUS
250.0000 mL | Freq: Once | INTRAVENOUS | Status: AC
  Administered 2021-07-09: 250 mL via INTRAVENOUS

## 2021-07-09 MED ORDER — ACETAMINOPHEN 500 MG PO TABS
1000.0000 mg | ORAL_TABLET | Freq: Once | ORAL | Status: DC
  Filled 2021-07-09: qty 2

## 2021-07-09 MED ORDER — HYDROMORPHONE HCL 1 MG/ML IJ SOLN
INTRAMUSCULAR | Status: DC | PRN
  Administered 2021-07-09 (×2): 1 mg via INTRAVENOUS

## 2021-07-09 MED ORDER — LACTATED RINGERS IV SOLN
INTRAVENOUS | Status: DC
Start: 2021-07-09 — End: 2021-07-09

## 2021-07-09 MED ORDER — PROPOFOL 1000 MG/100ML IV EMUL
INTRAVENOUS | Status: AC
  Filled 2021-07-09: qty 100

## 2021-07-09 MED ORDER — SODIUM CHLORIDE 0.9 % IR SOLN
Status: DC | PRN
  Administered 2021-07-09 (×2): 1000 mL

## 2021-07-09 MED ORDER — ACETAMINOPHEN 500 MG PO TABS
1000.0000 mg | ORAL_TABLET | Freq: Once | ORAL | Status: AC
  Administered 2021-07-09: 1000 mg via ORAL
  Filled 2021-07-09: qty 2

## 2021-07-09 MED ORDER — OXYCODONE HCL 5 MG PO TABS
ORAL_TABLET | ORAL | Status: AC
  Administered 2021-07-09: 5 mg via ORAL
  Filled 2021-07-09: qty 1

## 2021-07-09 MED ORDER — PHENYLEPHRINE HCL (PRESSORS) 10 MG/ML IV SOLN
INTRAVENOUS | Status: AC
  Filled 2021-07-09: qty 1

## 2021-07-09 MED ORDER — BUPIVACAINE HCL 0.25 % IJ SOLN
INTRAMUSCULAR | Status: DC | PRN
  Administered 2021-07-09: 30 mL

## 2021-07-09 MED ORDER — HYDROMORPHONE HCL 2 MG/ML IJ SOLN
INTRAMUSCULAR | Status: AC
  Filled 2021-07-09: qty 1

## 2021-07-09 MED ORDER — DEXAMETHASONE SODIUM PHOSPHATE 10 MG/ML IJ SOLN
INTRAMUSCULAR | Status: DC | PRN
  Administered 2021-07-09: 10 mg via INTRAVENOUS

## 2021-07-09 MED ORDER — ACETAMINOPHEN 500 MG PO TABS
1000.0000 mg | ORAL_TABLET | Freq: Four times a day (QID) | ORAL | Status: DC
  Administered 2021-07-09: 1000 mg via ORAL

## 2021-07-09 MED ORDER — BUPIVACAINE-EPINEPHRINE 0.25% -1:200000 IJ SOLN
INTRAMUSCULAR | Status: AC
  Filled 2021-07-09: qty 1

## 2021-07-09 MED ORDER — ACETAMINOPHEN 325 MG PO TABS: 325.0000 mg | ORAL_TABLET | Freq: Four times a day (QID) | ORAL | Status: DC | PRN

## 2021-07-09 MED ORDER — ONDANSETRON HCL 4 MG PO TABS
4.0000 mg | ORAL_TABLET | Freq: Four times a day (QID) | ORAL | Status: DC | PRN
  Filled 2021-07-09: qty 1

## 2021-07-09 MED ORDER — OXYCODONE HCL 5 MG/5ML PO SOLN: 5.0000 mg | Freq: Once | ORAL | Status: AC | PRN

## 2021-07-09 MED ORDER — KETOROLAC TROMETHAMINE 30 MG/ML IJ SOLN
INTRAMUSCULAR | Status: DC | PRN
  Administered 2021-07-09: 30 mg via INTRAMUSCULAR

## 2021-07-09 MED ORDER — KETOROLAC TROMETHAMINE 30 MG/ML IJ SOLN
INTRAMUSCULAR | Status: AC
  Filled 2021-07-09: qty 1

## 2021-07-09 MED ORDER — TRANEXAMIC ACID-NACL 1000-0.7 MG/100ML-% IV SOLN
1000.0000 mg | INTRAVENOUS | Status: AC
  Administered 2021-07-09: 1000 mg via INTRAVENOUS
  Filled 2021-07-09: qty 100

## 2021-07-09 MED ORDER — OXYCODONE HCL 5 MG PO TABS: 5.0000 mg | ORAL_TABLET | Freq: Once | ORAL | Status: AC | PRN

## 2021-07-09 MED ORDER — ONDANSETRON HCL 4 MG/2ML IJ SOLN: 4.0000 mg | Freq: Four times a day (QID) | INTRAMUSCULAR | Status: DC | PRN

## 2021-07-09 MED ORDER — PROPOFOL 10 MG/ML IV BOLUS
INTRAVENOUS | Status: DC | PRN
  Administered 2021-07-09: 30 mg via INTRAVENOUS
  Administered 2021-07-09: 20 mg via INTRAVENOUS
  Administered 2021-07-09: 50 mg via INTRAVENOUS
  Administered 2021-07-09: 70 mg via INTRAVENOUS

## 2021-07-09 MED ORDER — LIDOCAINE HCL (PF) 2 % IJ SOLN
INTRAMUSCULAR | Status: AC
  Filled 2021-07-09: qty 5

## 2021-07-09 MED ORDER — HYDROMORPHONE HCL 1 MG/ML IJ SOLN
INTRAMUSCULAR | Status: AC
  Administered 2021-07-09: 1 mg via INTRAVENOUS
  Filled 2021-07-09: qty 1

## 2021-07-09 MED ORDER — ORAL CARE MOUTH RINSE: 15.0000 mL | Freq: Once | OROMUCOSAL | Status: AC

## 2021-07-09 MED ORDER — ACETAMINOPHEN 500 MG PO TABS
ORAL_TABLET | ORAL | Status: AC
  Filled 2021-07-09: qty 2

## 2021-07-09 MED ORDER — PROPOFOL 500 MG/50ML IV EMUL
INTRAVENOUS | Status: AC
  Filled 2021-07-09: qty 50

## 2021-07-09 MED ORDER — SENNA-DOCUSATE SODIUM 8.6-50 MG PO TABS: 2.0000 | ORAL_TABLET | Freq: Every day | ORAL | 1 refills | Status: AC

## 2021-07-09 MED ORDER — AMISULPRIDE (ANTIEMETIC) 5 MG/2ML IV SOLN: 10.0000 mg | Freq: Once | INTRAVENOUS | Status: DC | PRN

## 2021-07-09 MED ORDER — PHENYLEPHRINE HCL-NACL 20-0.9 MG/250ML-% IV SOLN
INTRAVENOUS | Status: DC | PRN
  Administered 2021-07-09: 25 ug/min via INTRAVENOUS

## 2021-07-09 MED ORDER — FENTANYL CITRATE (PF) 100 MCG/2ML IJ SOLN
INTRAMUSCULAR | Status: AC
  Filled 2021-07-09: qty 2

## 2021-07-09 MED ORDER — POVIDONE-IODINE 10 % EX SWAB: 2.0000 "application " | Freq: Once | CUTANEOUS | Status: DC

## 2021-07-09 MED ORDER — KETAMINE HCL 10 MG/ML IJ SOLN
INTRAMUSCULAR | Status: AC
  Filled 2021-07-09: qty 1

## 2021-07-09 MED ORDER — OXYCODONE HCL 5 MG PO TABS: 10.0000 mg | ORAL_TABLET | ORAL | Status: DC | PRN

## 2021-07-09 MED ORDER — DEXAMETHASONE SODIUM PHOSPHATE 10 MG/ML IJ SOLN
INTRAMUSCULAR | Status: AC
  Filled 2021-07-09: qty 1

## 2021-07-09 MED ORDER — FENTANYL CITRATE PF 50 MCG/ML IJ SOSY: 25.0000 ug | PREFILLED_SYRINGE | INTRAMUSCULAR | Status: DC | PRN

## 2021-07-09 MED ORDER — KETAMINE HCL 10 MG/ML IJ SOLN
INTRAMUSCULAR | Status: DC | PRN
  Administered 2021-07-09: 20 mg via INTRAVENOUS
  Administered 2021-07-09: 30 mg via INTRAVENOUS

## 2021-07-09 MED ORDER — FENTANYL CITRATE (PF) 100 MCG/2ML IJ SOLN
INTRAMUSCULAR | Status: DC | PRN
  Administered 2021-07-09 (×3): 25 ug via INTRAVENOUS
  Administered 2021-07-09: 100 ug via INTRAVENOUS
  Administered 2021-07-09: 25 ug via INTRAVENOUS

## 2021-07-09 MED ORDER — CHLORHEXIDINE GLUCONATE 0.12 % MT SOLN
15.0000 mL | Freq: Once | OROMUCOSAL | Status: AC
  Administered 2021-07-09: 15 mL via OROMUCOSAL

## 2021-07-09 MED ORDER — MIDAZOLAM HCL 5 MG/5ML IJ SOLN
INTRAMUSCULAR | Status: DC | PRN
  Administered 2021-07-09 (×2): 1 mg via INTRAVENOUS

## 2021-07-09 MED ORDER — HYDROMORPHONE HCL 1 MG/ML IJ SOLN: 0.5000 mg | INTRAMUSCULAR | Status: DC | PRN

## 2021-07-09 MED ORDER — BACLOFEN 10 MG PO TABS: 10.0000 mg | ORAL_TABLET | Freq: Three times a day (TID) | ORAL | 0 refills | Status: DC

## 2021-07-09 MED ORDER — ONDANSETRON HCL 4 MG/2ML IJ SOLN
INTRAMUSCULAR | Status: AC
  Filled 2021-07-09: qty 2

## 2021-07-09 MED ORDER — OXYCODONE HCL 5 MG PO TABS: 5.0000 mg | ORAL_TABLET | ORAL | Status: DC | PRN

## 2021-07-09 MED ORDER — POVIDONE-IODINE 7.5 % EX SOLN
Freq: Once | CUTANEOUS | Status: DC
Start: 2021-07-09 — End: 2021-07-09

## 2021-07-09 MED ORDER — METOCLOPRAMIDE HCL 5 MG/ML IJ SOLN: 5.0000 mg | Freq: Three times a day (TID) | INTRAMUSCULAR | Status: DC | PRN

## 2021-07-09 MED ORDER — WATER FOR IRRIGATION, STERILE IR SOLN
Status: DC | PRN
  Administered 2021-07-09: 2000 mL

## 2021-07-09 MED ORDER — OXYCODONE HCL 10 MG PO TABS: 5.0000 mg | ORAL_TABLET | ORAL | 0 refills | Status: DC | PRN

## 2021-07-09 MED ORDER — ONDANSETRON HCL 4 MG PO TABS: 4.0000 mg | ORAL_TABLET | Freq: Three times a day (TID) | ORAL | 0 refills | Status: DC | PRN

## 2021-07-09 MED ORDER — ONDANSETRON HCL 4 MG/2ML IJ SOLN: 4.0000 mg | Freq: Once | INTRAMUSCULAR | Status: DC | PRN

## 2021-07-09 MED ORDER — ASPIRIN 325 MG PO TBEC: 325.0000 mg | DELAYED_RELEASE_TABLET | Freq: Two times a day (BID) | ORAL | 0 refills | Status: DC

## 2021-07-09 MED ORDER — LACTATED RINGERS IV BOLUS
500.0000 mL | Freq: Once | INTRAVENOUS | Status: AC
  Administered 2021-07-09: 500 mL via INTRAVENOUS

## 2021-07-09 MED ORDER — METOCLOPRAMIDE HCL 5 MG PO TABS
5.0000 mg | ORAL_TABLET | Freq: Three times a day (TID) | ORAL | Status: DC | PRN
  Filled 2021-07-09: qty 2

## 2021-07-09 SURGICAL SUPPLY — 61 items
ATTUNE PSFEM LTSZ5 NARCEM KNEE (Femur) ×1 IMPLANT
BAG COUNTER SPONGE SURGICOUNT (BAG) ×1 IMPLANT
BAG SPEC THK2 15X12 ZIP CLS (MISCELLANEOUS) ×1
BAG SPNG CNTER NS LX DISP (BAG) ×1
BAG ZIPLOCK 12X15 (MISCELLANEOUS) ×1 IMPLANT
BASEPLATE TIB CMT FB PCKT SZ4 (Stem) ×1 IMPLANT
BLADE SAG 18X100X1.27 (BLADE) ×2 IMPLANT
BLADE SAW SGTL 11.0X1.19X90.0M (BLADE) ×2 IMPLANT
BLADE SAW SGTL 13X75X1.27 (BLADE) ×2 IMPLANT
BLADE SURG 15 STRL LF DISP TIS (BLADE) ×1 IMPLANT
BLADE SURG 15 STRL SS (BLADE) ×2
BNDG CMPR MED 10X6 ELC LF (GAUZE/BANDAGES/DRESSINGS) ×1
BNDG ELASTIC 6X10 VLCR STRL LF (GAUZE/BANDAGES/DRESSINGS) ×2 IMPLANT
BNDG ELASTIC 6X5.8 VLCR STR LF (GAUZE/BANDAGES/DRESSINGS) ×1 IMPLANT
BOWL SMART MIX CTS (DISPOSABLE) ×2 IMPLANT
BSPLAT TIB 4 CMNT FX BRNG STRL (Stem) ×1 IMPLANT
CEMENT HV SMART SET (Cement) ×4 IMPLANT
CLSR STERI-STRIP ANTIMIC 1/2X4 (GAUZE/BANDAGES/DRESSINGS) ×3 IMPLANT
COVER SURGICAL LIGHT HANDLE (MISCELLANEOUS) ×2 IMPLANT
CUFF TOURN SGL QUICK 34 (TOURNIQUET CUFF) ×2
CUFF TRNQT CYL 34X4.125X (TOURNIQUET CUFF) ×1 IMPLANT
DRAPE SHEET LG 3/4 BI-LAMINATE (DRAPES) ×2 IMPLANT
DRAPE U-SHAPE 47X51 STRL (DRAPES) ×2 IMPLANT
DRSG MEPILEX BORDER 4X12 (GAUZE/BANDAGES/DRESSINGS) ×2 IMPLANT
DRSG MEPILEX BORDER 4X8 (GAUZE/BANDAGES/DRESSINGS) ×1 IMPLANT
DRSG PAD ABDOMINAL 8X10 ST (GAUZE/BANDAGES/DRESSINGS) ×4 IMPLANT
DURAPREP 26ML APPLICATOR (WOUND CARE) ×4 IMPLANT
ELECT REM PT RETURN 15FT ADLT (MISCELLANEOUS) ×2 IMPLANT
GAUZE PAD ABD 7.5X8 STRL (GAUZE/BANDAGES/DRESSINGS) ×1 IMPLANT
GLOVE BIO SURGEON STRL SZ 6.5 (GLOVE) ×3 IMPLANT
GLOVE BIO SURGEON STRL SZ7.5 (GLOVE) ×3 IMPLANT
GLOVE BIOGEL PI IND STRL 7.0 (GLOVE) ×1 IMPLANT
GLOVE BIOGEL PI IND STRL 8 (GLOVE) ×1 IMPLANT
GLOVE BIOGEL PI INDICATOR 7.0 (GLOVE) ×4
GLOVE BIOGEL PI INDICATOR 8 (GLOVE) ×1
GOWN STRL REIN XL XLG (GOWN DISPOSABLE) ×4 IMPLANT
HANDPIECE INTERPULSE COAX TIP (DISPOSABLE) ×2
HOLDER FOLEY CATH W/STRAP (MISCELLANEOUS) ×1 IMPLANT
HOOD PEEL AWAY FLYTE STAYCOOL (MISCELLANEOUS) ×3 IMPLANT
IMMOBILIZER KNEE 20 (SOFTGOODS) ×2
IMMOBILIZER KNEE 20 THIGH 36 (SOFTGOODS) ×1 IMPLANT
INSERT TIB ATTUNE FB SZ5X7 (Insert) ×1 IMPLANT
KIT TURNOVER KIT A (KITS) ×1 IMPLANT
MANIFOLD NEPTUNE II (INSTRUMENTS) ×2 IMPLANT
NS IRRIG 1000ML POUR BTL (IV SOLUTION) ×2 IMPLANT
PACK TOTAL KNEE CUSTOM (KITS) ×2 IMPLANT
PATELLA MEDIAL ATTUN 35MM KNEE (Knees) ×1 IMPLANT
PROTECTOR NERVE ULNAR (MISCELLANEOUS) ×2 IMPLANT
SET HNDPC FAN SPRY TIP SCT (DISPOSABLE) ×1 IMPLANT
SET PAD KNEE POSITIONER (MISCELLANEOUS) ×2 IMPLANT
SPIKE FLUID TRANSFER (MISCELLANEOUS) IMPLANT
STRIP CLOSURE SKIN 1/2X4 (GAUZE/BANDAGES/DRESSINGS) ×2 IMPLANT
SUT MNCRL AB 4-0 PS2 18 (SUTURE) ×1 IMPLANT
SUT VIC AB 1 CT1 36 (SUTURE) ×5 IMPLANT
SUT VIC AB 2-0 CT1 27 (SUTURE) ×4
SUT VIC AB 2-0 CT1 TAPERPNT 27 (SUTURE) ×1 IMPLANT
SUT VIC AB 3-0 SH 8-18 (SUTURE) ×2 IMPLANT
TRAY FOLEY MTR SLVR 16FR STAT (SET/KITS/TRAYS/PACK) ×2 IMPLANT
TUBE SUCTION HIGH CAP CLEAR NV (SUCTIONS) ×2 IMPLANT
WATER STERILE IRR 1000ML POUR (IV SOLUTION) ×4 IMPLANT
WRAP KNEE MAXI GEL POST OP (GAUZE/BANDAGES/DRESSINGS) ×2 IMPLANT

## 2021-07-09 NOTE — Evaluation (Signed)
Physical Therapy Evaluation Patient Details Name: [name] MRN: [id number] DOB: Jun 12, 1950 Today's Date: 07/09/2021  History of Present Illness  Pt is a 71yo female presenting s/p L-TKA on 07/09/21 PMH: Anxiety & depression, fibromyalgia, chronic back pain, GERD, gout, HTN, peripheral neuropathy, OSA on CPAP, vertigo, lumbar fusion L3-4 L4-5.  Clinical Impression  [name] is a 71 y.o. female POD 0 s/p L-TKA. Patient reports independence with mobility at baseline. Patient is now limited by functional impairments (see PT problem list below) and requires min guard for transfers and gait with RW. Patient was able to ambulate 80 feet with RW and min guard and cues for safe walker management. Patient educated on safe sequencing for stair mobility and verbalized safe guarding position for people assisting with mobility. Patient instructed in exercises to facilitate ROM and circulation. Patient will benefit from continued skilled PT interventions to address impairments and progress towards PLOF. Patient has met mobility goals at adequate level for discharge home; will continue to follow if pt continues acute stay to progress towards Mod I goals.        Recommendations for follow up therapy are one component of a multi-disciplinary discharge planning process, led by the attending physician.  Recommendations may be updated based on patient status, additional functional criteria and insurance authorization.  Follow Up Recommendations Follow physician's recommendations for discharge plan and follow up therapies      Assistance Recommended at Discharge Intermittent Supervision/Assistance  Patient can return home with the following  A little help with walking and/or transfers;A little help with bathing/dressing/bathroom;Assistance with cooking/housework;Assist for transportation;Help with stairs or ramp for entrance    Equipment Recommendations None recommended by PT (Pt has recommended DME)   Recommendations for Other Services       Functional Status Assessment Patient has had a recent decline in their functional status and demonstrates the ability to make significant improvements in function in a reasonable and predictable amount of time.     Precautions / Restrictions Precautions Precautions: Fall Required Braces or Orthoses: Knee Immobilizer - Left Knee Immobilizer - Left: On when out of bed or walking Restrictions Weight Bearing Restrictions: No Other Position/Activity Restrictions: wbat      Mobility  Bed Mobility Overal bed mobility: Needs Assistance Bed Mobility: Supine to Sit     Supine to sit: Supervision     General bed mobility comments: for safety only    Transfers Overall transfer level: Needs assistance Equipment used: Rolling walker (2 wheels) Transfers: Sit to/from Stand Sit to Stand: Min guard           General transfer comment: for safety only    Ambulation/Gait Ambulation/Gait assistance: Min guard Gait Distance (Feet): 80 Feet Assistive device: Rolling walker (2 wheels) Gait Pattern/deviations: Step-to pattern Gait velocity: decreased     General Gait Details: Pt ambulated 63ft with RW and min guard assist, no physical assist required or overt LOB noted.  Stairs Stairs: Yes Stairs assistance: Min guard Stair Management: One rail Right, Step to pattern, Forwards, With cane Number of Stairs: 2 General stair comments: Pt educated on stair ascent/descent with 1rail + SPC, verbalized understanding. Demonstrated safe technique with min guard, no physical assist required or overt LOB noted, VCs for sequencing, teach back method applied for reinforcement.  Wheelchair Mobility    Modified Rankin (Stroke Patients Only)       Balance Overall balance assessment: Needs assistance Sitting-balance support: Feet supported, No upper extremity supported Sitting balance-Leahy Scale: Good     Standing  balance support: Reliant on  assistive device for balance, During functional activity, Bilateral upper extremity supported Standing balance-Leahy Scale: Poor                               Pertinent Vitals/Pain Pain Assessment Pain Assessment: 0-10 Pain Score: 5  Pain Location: back, L knee Pain Descriptors / Indicators: Operative site guarding, Discomfort Pain Intervention(s): Limited activity within patient's tolerance, Monitored during session, Repositioned    Home Living Family/patient expects to be discharged to:: Private residence Living Arrangements: Spouse/significant other Available Help at Discharge: Family;Available 24 hours/day Type of Home: House Home Access: Stairs to enter Entrance Stairs-Rails: Right Entrance Stairs-Number of Steps: 4 Alternate Level Stairs-Number of Steps: 6 Home Layout: Multi-level (split level) Home Equipment: Shower seat;Toilet riser;Cane - single point;Rolling Walker (2 wheels)      Prior Function Prior Level of Function : Independent/Modified Independent             Mobility Comments: ind ADLs Comments: ind     Hand Dominance        Extremity/Trunk Assessment   Upper Extremity Assessment Upper Extremity Assessment: Overall WFL for tasks assessed    Lower Extremity Assessment Lower Extremity Assessment: LLE deficits/detail;RLE deficits/detail RLE Deficits / Details: MMT ank DF/PF 5/5 RLE Sensation: history of peripheral neuropathy (Pt reports same sensation side to side) LLE Deficits / Details: MMT ank DF/PF 5/5, no extensor lag noted LLE Sensation: history of peripheral neuropathy (same sensation side to side)    Cervical / Trunk Assessment Cervical / Trunk Assessment: Back Surgery  Communication   Communication: No difficulties  Cognition Arousal/Alertness: Awake/alert Behavior During Therapy: WFL for tasks assessed/performed Overall Cognitive Status: Within Functional Limits for tasks assessed                                           General Comments      Exercises Total Joint Exercises Ankle Circles/Pumps: AROM, Both, 10 reps, Seated Quad Sets: AROM, 5 reps, Seated, Left Short Arc Quad: AROM, 5 reps, Seated, Left (multimodal cuing) Heel Slides: AAROM, Left, 5 reps Hip ABduction/ADduction: AROM, Left, 5 reps Straight Leg Raises: Left, AAROM, 5 reps   Assessment/Plan    PT Assessment Patient needs continued PT services  PT Problem List Decreased strength;Decreased range of motion;Decreased activity tolerance;Decreased balance;Decreased mobility;Decreased coordination;Decreased knowledge of use of DME;Pain       PT Treatment Interventions DME instruction;Gait training;Stair training;Functional mobility training;Therapeutic activities;Therapeutic exercise;Balance training;Neuromuscular re-education;Patient/family education    PT Goals (Current goals can be found in the Care Plan section)  Acute Rehab PT Goals Patient Stated Goal: Walk without pain PT Goal Formulation: With patient Time For Goal Achievement: 07/16/21 Potential to Achieve Goals: Good    Frequency 7X/week     Co-evaluation               AM-PAC PT "6 Clicks" Mobility  Outcome Measure Help needed turning from your back to your side while in a flat bed without using bedrails?: None Help needed moving from lying on your back to sitting on the side of a flat bed without using bedrails?: A Little Help needed moving to and from a bed to a chair (including a wheelchair)?: A Little Help needed standing up from a chair using your arms (e.g., wheelchair or bedside chair)?: A Little Help needed  to walk in hospital room?: A Little Help needed climbing 3-5 steps with a railing? : A Little 6 Click Score: 19    End of Session Equipment Utilized During Treatment: Gait belt Activity Tolerance: Patient tolerated treatment well;No increased pain Patient left: in chair;with call bell/phone within reach Nurse Communication: Mobility  status PT Visit Diagnosis: Pain;Difficulty in walking, not elsewhere classified (R26.2) Pain - Right/Left: Left Pain - part of body: Knee (and back)    Time: 1207-1258 PT Time Calculation (min) (ACUTE ONLY): 51 min   Charges:   PT Evaluation $PT Eval Low Complexity: 1 Low PT Treatments $Gait Training: 8-22 mins $Therapeutic Exercise: 8-22 mins        Jamesetta Geralds, PT, DPT [facility] Department Office: [phone] Pager: [phone]  Jamesetta Geralds 07/09/2021, 1:12 PM

## 2021-07-09 NOTE — Op Note (Signed)
DATE OF SURGERY:  07/09/2021 TIME: 9:48 AM  PATIENT NAME:  [name]   AGE: 71 y.o.    PRE-OPERATIVE DIAGNOSIS: Left knee primary localized osteoarthritis  POST-OPERATIVE DIAGNOSIS:  Same  PROCEDURE: Left total Knee Arthroplasty  SURGEON:  Eulas Post, MD   ASSISTANT: Alfonse Alpers PA-C, present and scrubbed throughout the case, critical for assistance with exposure, retraction, instrumentation, and closure.   OPERATIVE IMPLANTS: Depuy Attune size 5 narrow posterior Stabilized Femur, with a size 4 fixed Bearing Tibia, 7 polyethylene insert with a 35 medialized oval dome polyethylene patella.  PREOPERATIVE INDICATIONS:  [name] is a 71 y.o. year old female with end stage bone on bone degenerative arthritis of the knee who failed conservative treatment, including injections, antiinflammatories, activity modification, and assistive devices, and had significant impairment of their activities of daily living, and elected for Total Knee Arthroplasty.   The risks, benefits, and alternatives were discussed at length including but not limited to the risks of infection, bleeding, nerve injury, stiffness, blood clots, the need for revision surgery, cardiopulmonary complications, among others, and they were willing to proceed.  OPERATIVE FINDINGS AND UNIQUE ASPECTS OF THE CASE: She had eburnation of the medial compartment, with osteophyte formation throughout, the lateral femur was preserved although the patellofemoral joint had severe degenerative changes as well.  Her knee felt fairly hyper lax before the procedure, with at least 20 to 30 degrees of varus valgus play.  It was challenging to get her leg into full flexion because of her body habitus, and I had to adjust the legholder multiple times.  ESTIMATED BLOOD LOSS: 150 mL  OPERATIVE DESCRIPTION:  The patient was brought to the operative room and placed in a supine position.  Anesthesia was administered.  She attempted  to have a spinal, but was converted to general.  IV antibiotics were given.  The lower extremity was prepped and draped in the usual sterile fashion.  Time out was performed.  The leg was elevated and exsanguinated and the tourniquet was inflated.  Anterior quadriceps tendon splitting approach was performed.  The patella was everted and osteophytes were removed.  The anterior horn of the medial and lateral meniscus was removed.   The patella was then measured, and cut with the saw.  The thickness before the cut was 23.5 and after the cut was 14.5.  A metal shield was used to protect the patella throughout the case.    The distal femur was opened with the drill and the intramedullary distal femoral cutting jig was utilized, set at 5 degrees resecting 9 mm off the distal femur.  Care was taken to protect the collateral ligaments.  Then the extramedullary tibial cutting jig was utilized making the appropriate cut using the anterior tibial crest as a reference building in appropriate posterior slope.  Care was taken during the cut to protect the medial and collateral ligaments.  The proximal tibia was removed along with the posterior horns of the menisci.  The PCL was sacrificed.    The extensor gap was measured and found to have adequate resection, measuring to a size between 7 and 8.    The distal femoral sizing jig was applied, taking care to avoid notching.  This was set at 3 degrees of external rotation.  Initially I was concerned that I had overly externally rotated the guidepins, so I reset the guidepins and recheck the jig.  The anterior lateral flange was much more prominent than the medial side.  I did  not notch.  Then the 4-in-1 cutting jig was applied and the anterior and posterior femur was cut, along with the chamfer cuts.  All posterior osteophytes were removed.  The flexion gap was then measured and was symmetric with the extension gap.  I completed the distal femoral preparation using the  appropriate jig to prepare the box.  The proximal tibia sized and prepared accordingly with the reamer and the punch, and then all components were trialed with the poly insert.  The knee was found to have excellent balance and full motion.    The above named components were then cemented into place and all excess cement was removed.  The real polyethylene implant was placed.  After the cement had cured I released the tourniquet and confirmed excellent hemostasis with no major posterior vessel injury.    The knee was easily taken through a range of motion and the patella tracked well and the knee irrigated copiously and the parapatellar and subcutaneous tissue closed with vicryl, and monocryl with steri strips for the skin.  The wounds were injected with marcaine, and dressed with sterile gauze and the patient was awakened and returned to the PACU in stable and satisfactory condition.  There were no complications.  Total tourniquet time was approximately 92 minutes.

## 2021-07-10 ENCOUNTER — Encounter (HOSPITAL_COMMUNITY): Payer: Self-pay | Admitting: Orthopedic Surgery

## 2021-07-12 DIAGNOSIS — Z96652 Presence of left artificial knee joint: Secondary | ICD-10-CM | POA: Diagnosis not present

## 2021-07-12 DIAGNOSIS — M6281 Muscle weakness (generalized): Secondary | ICD-10-CM | POA: Diagnosis not present

## 2021-07-12 DIAGNOSIS — R262 Difficulty in walking, not elsewhere classified: Secondary | ICD-10-CM | POA: Diagnosis not present

## 2021-07-12 DIAGNOSIS — M25562 Pain in left knee: Secondary | ICD-10-CM | POA: Diagnosis not present

## 2021-07-13 ENCOUNTER — Emergency Department (HOSPITAL_COMMUNITY)
Admission: EM | Admit: 2021-07-13 | Discharge: 2021-07-13 | Discharge status: Against Medical Advice | Payer: Medicare Other | Source: Home / Self Care

## 2021-07-13 ENCOUNTER — Emergency Department (HOSPITAL_COMMUNITY)
Admission: EM | Admit: 2021-07-13 | Discharge: 2021-07-13 | Disposition: A | Discharge status: Home / Self Care | Payer: Medicare Other | Source: Home / Self Care

## 2021-07-13 ENCOUNTER — Other Ambulatory Visit: Payer: Self-pay

## 2021-07-13 DIAGNOSIS — M1712 Unilateral primary osteoarthritis, left knee: Secondary | ICD-10-CM | POA: Diagnosis not present

## 2021-07-15 DIAGNOSIS — M6281 Muscle weakness (generalized): Secondary | ICD-10-CM | POA: Diagnosis not present

## 2021-07-15 DIAGNOSIS — R262 Difficulty in walking, not elsewhere classified: Secondary | ICD-10-CM | POA: Diagnosis not present

## 2021-07-15 DIAGNOSIS — M25562 Pain in left knee: Secondary | ICD-10-CM | POA: Diagnosis not present

## 2021-07-15 DIAGNOSIS — Z96652 Presence of left artificial knee joint: Secondary | ICD-10-CM | POA: Diagnosis not present

## 2021-07-15 DIAGNOSIS — M1712 Unilateral primary osteoarthritis, left knee: Secondary | ICD-10-CM | POA: Diagnosis not present

## 2021-07-18 DIAGNOSIS — M25562 Pain in left knee: Secondary | ICD-10-CM | POA: Diagnosis not present

## 2021-07-18 DIAGNOSIS — Z96652 Presence of left artificial knee joint: Secondary | ICD-10-CM | POA: Diagnosis not present

## 2021-07-18 DIAGNOSIS — M6281 Muscle weakness (generalized): Secondary | ICD-10-CM | POA: Diagnosis not present

## 2021-07-18 DIAGNOSIS — R262 Difficulty in walking, not elsewhere classified: Secondary | ICD-10-CM | POA: Diagnosis not present

## 2021-07-19 DIAGNOSIS — M6281 Muscle weakness (generalized): Secondary | ICD-10-CM | POA: Diagnosis not present

## 2021-07-19 DIAGNOSIS — Z96652 Presence of left artificial knee joint: Secondary | ICD-10-CM | POA: Diagnosis not present

## 2021-07-19 DIAGNOSIS — R262 Difficulty in walking, not elsewhere classified: Secondary | ICD-10-CM | POA: Diagnosis not present

## 2021-07-19 DIAGNOSIS — M25562 Pain in left knee: Secondary | ICD-10-CM | POA: Diagnosis not present

## 2021-07-22 DIAGNOSIS — M1712 Unilateral primary osteoarthritis, left knee: Secondary | ICD-10-CM | POA: Diagnosis not present

## 2021-07-26 DIAGNOSIS — R300 Dysuria: Secondary | ICD-10-CM | POA: Diagnosis not present

## 2021-07-29 DIAGNOSIS — R262 Difficulty in walking, not elsewhere classified: Secondary | ICD-10-CM | POA: Diagnosis not present

## 2021-07-29 DIAGNOSIS — Z96652 Presence of left artificial knee joint: Secondary | ICD-10-CM | POA: Diagnosis not present

## 2021-07-29 DIAGNOSIS — M6281 Muscle weakness (generalized): Secondary | ICD-10-CM | POA: Diagnosis not present

## 2021-07-29 DIAGNOSIS — M25562 Pain in left knee: Secondary | ICD-10-CM | POA: Diagnosis not present

## 2021-07-31 DIAGNOSIS — M6281 Muscle weakness (generalized): Secondary | ICD-10-CM | POA: Diagnosis not present

## 2021-07-31 DIAGNOSIS — M25562 Pain in left knee: Secondary | ICD-10-CM | POA: Diagnosis not present

## 2021-07-31 DIAGNOSIS — R262 Difficulty in walking, not elsewhere classified: Secondary | ICD-10-CM | POA: Diagnosis not present

## 2021-07-31 DIAGNOSIS — Z96652 Presence of left artificial knee joint: Secondary | ICD-10-CM | POA: Diagnosis not present

## 2021-08-05 DIAGNOSIS — M6281 Muscle weakness (generalized): Secondary | ICD-10-CM | POA: Diagnosis not present

## 2021-08-05 DIAGNOSIS — R262 Difficulty in walking, not elsewhere classified: Secondary | ICD-10-CM | POA: Diagnosis not present

## 2021-08-05 DIAGNOSIS — M25562 Pain in left knee: Secondary | ICD-10-CM | POA: Diagnosis not present

## 2021-08-05 DIAGNOSIS — Z96652 Presence of left artificial knee joint: Secondary | ICD-10-CM | POA: Diagnosis not present

## 2021-08-05 DIAGNOSIS — R300 Dysuria: Secondary | ICD-10-CM | POA: Diagnosis not present

## 2021-08-06 DIAGNOSIS — M810 Age-related osteoporosis without current pathological fracture: Secondary | ICD-10-CM | POA: Diagnosis not present

## 2021-08-06 DIAGNOSIS — M8589 Other specified disorders of bone density and structure, multiple sites: Secondary | ICD-10-CM | POA: Diagnosis not present

## 2021-08-07 DIAGNOSIS — M6281 Muscle weakness (generalized): Secondary | ICD-10-CM | POA: Diagnosis not present

## 2021-08-07 DIAGNOSIS — M25562 Pain in left knee: Secondary | ICD-10-CM | POA: Diagnosis not present

## 2021-08-07 DIAGNOSIS — R262 Difficulty in walking, not elsewhere classified: Secondary | ICD-10-CM | POA: Diagnosis not present

## 2021-08-07 DIAGNOSIS — Z96652 Presence of left artificial knee joint: Secondary | ICD-10-CM | POA: Diagnosis not present

## 2021-08-12 DIAGNOSIS — Z96652 Presence of left artificial knee joint: Secondary | ICD-10-CM | POA: Diagnosis not present

## 2021-08-12 DIAGNOSIS — R262 Difficulty in walking, not elsewhere classified: Secondary | ICD-10-CM | POA: Diagnosis not present

## 2021-08-12 DIAGNOSIS — M6281 Muscle weakness (generalized): Secondary | ICD-10-CM | POA: Diagnosis not present

## 2021-08-12 DIAGNOSIS — M25562 Pain in left knee: Secondary | ICD-10-CM | POA: Diagnosis not present

## 2021-08-14 DIAGNOSIS — R262 Difficulty in walking, not elsewhere classified: Secondary | ICD-10-CM | POA: Diagnosis not present

## 2021-08-14 DIAGNOSIS — Z96652 Presence of left artificial knee joint: Secondary | ICD-10-CM | POA: Diagnosis not present

## 2021-08-14 DIAGNOSIS — M25562 Pain in left knee: Secondary | ICD-10-CM | POA: Diagnosis not present

## 2021-08-14 DIAGNOSIS — M6281 Muscle weakness (generalized): Secondary | ICD-10-CM | POA: Diagnosis not present

## 2021-08-19 DIAGNOSIS — M6281 Muscle weakness (generalized): Secondary | ICD-10-CM | POA: Diagnosis not present

## 2021-08-19 DIAGNOSIS — R262 Difficulty in walking, not elsewhere classified: Secondary | ICD-10-CM | POA: Diagnosis not present

## 2021-08-19 DIAGNOSIS — M1712 Unilateral primary osteoarthritis, left knee: Secondary | ICD-10-CM | POA: Diagnosis not present

## 2021-08-19 DIAGNOSIS — M25562 Pain in left knee: Secondary | ICD-10-CM | POA: Diagnosis not present

## 2021-08-19 DIAGNOSIS — Z96652 Presence of left artificial knee joint: Secondary | ICD-10-CM | POA: Diagnosis not present

## 2021-08-21 DIAGNOSIS — R262 Difficulty in walking, not elsewhere classified: Secondary | ICD-10-CM | POA: Diagnosis not present

## 2021-08-21 DIAGNOSIS — M25562 Pain in left knee: Secondary | ICD-10-CM | POA: Diagnosis not present

## 2021-08-21 DIAGNOSIS — M6281 Muscle weakness (generalized): Secondary | ICD-10-CM | POA: Diagnosis not present

## 2021-08-21 DIAGNOSIS — Z96652 Presence of left artificial knee joint: Secondary | ICD-10-CM | POA: Diagnosis not present

## 2021-08-26 DIAGNOSIS — I10 Essential (primary) hypertension: Secondary | ICD-10-CM | POA: Diagnosis not present

## 2021-08-26 DIAGNOSIS — G4733 Obstructive sleep apnea (adult) (pediatric): Secondary | ICD-10-CM | POA: Diagnosis not present

## 2021-09-11 ENCOUNTER — Ambulatory Visit: Payer: Medicare Other | Admitting: Cardiology

## 2021-10-04 DIAGNOSIS — D529 Folate deficiency anemia, unspecified: Secondary | ICD-10-CM | POA: Diagnosis not present

## 2021-10-04 DIAGNOSIS — R739 Hyperglycemia, unspecified: Secondary | ICD-10-CM | POA: Diagnosis not present

## 2021-10-04 DIAGNOSIS — E7849 Other hyperlipidemia: Secondary | ICD-10-CM | POA: Diagnosis not present

## 2021-10-04 DIAGNOSIS — D649 Anemia, unspecified: Secondary | ICD-10-CM | POA: Diagnosis not present

## 2021-10-04 DIAGNOSIS — D519 Vitamin B12 deficiency anemia, unspecified: Secondary | ICD-10-CM | POA: Diagnosis not present

## 2021-10-04 DIAGNOSIS — Z1159 Encounter for screening for other viral diseases: Secondary | ICD-10-CM | POA: Diagnosis not present

## 2021-10-04 DIAGNOSIS — E782 Mixed hyperlipidemia: Secondary | ICD-10-CM | POA: Diagnosis not present

## 2021-10-04 DIAGNOSIS — N183 Chronic kidney disease, stage 3 unspecified: Secondary | ICD-10-CM | POA: Diagnosis not present

## 2021-10-09 DIAGNOSIS — M8580 Other specified disorders of bone density and structure, unspecified site: Secondary | ICD-10-CM | POA: Diagnosis not present

## 2021-10-09 DIAGNOSIS — E7849 Other hyperlipidemia: Secondary | ICD-10-CM | POA: Diagnosis not present

## 2021-10-09 DIAGNOSIS — N183 Chronic kidney disease, stage 3 unspecified: Secondary | ICD-10-CM | POA: Diagnosis not present

## 2021-10-09 DIAGNOSIS — G4733 Obstructive sleep apnea (adult) (pediatric): Secondary | ICD-10-CM | POA: Diagnosis not present

## 2021-10-09 DIAGNOSIS — I10 Essential (primary) hypertension: Secondary | ICD-10-CM | POA: Diagnosis not present

## 2021-10-09 DIAGNOSIS — G629 Polyneuropathy, unspecified: Secondary | ICD-10-CM | POA: Diagnosis not present

## 2021-10-09 DIAGNOSIS — M797 Fibromyalgia: Secondary | ICD-10-CM | POA: Diagnosis not present

## 2021-12-09 DIAGNOSIS — M797 Fibromyalgia: Secondary | ICD-10-CM | POA: Diagnosis not present

## 2021-12-09 DIAGNOSIS — N1831 Chronic kidney disease, stage 3a: Secondary | ICD-10-CM | POA: Diagnosis not present

## 2021-12-09 DIAGNOSIS — G629 Polyneuropathy, unspecified: Secondary | ICD-10-CM | POA: Diagnosis not present

## 2021-12-09 DIAGNOSIS — E7849 Other hyperlipidemia: Secondary | ICD-10-CM | POA: Diagnosis not present

## 2021-12-09 DIAGNOSIS — R030 Elevated blood-pressure reading, without diagnosis of hypertension: Secondary | ICD-10-CM | POA: Diagnosis not present

## 2022-01-14 DIAGNOSIS — M797 Fibromyalgia: Secondary | ICD-10-CM | POA: Diagnosis not present

## 2022-01-14 DIAGNOSIS — E559 Vitamin D deficiency, unspecified: Secondary | ICD-10-CM | POA: Diagnosis not present

## 2022-01-14 DIAGNOSIS — G629 Polyneuropathy, unspecified: Secondary | ICD-10-CM | POA: Diagnosis not present

## 2022-01-14 DIAGNOSIS — R059 Cough, unspecified: Secondary | ICD-10-CM | POA: Diagnosis not present

## 2022-01-14 DIAGNOSIS — R7303 Prediabetes: Secondary | ICD-10-CM | POA: Diagnosis not present

## 2022-01-14 DIAGNOSIS — R5383 Other fatigue: Secondary | ICD-10-CM | POA: Diagnosis not present

## 2022-01-14 DIAGNOSIS — I10 Essential (primary) hypertension: Secondary | ICD-10-CM | POA: Diagnosis not present

## 2022-01-14 DIAGNOSIS — E538 Deficiency of other specified B group vitamins: Secondary | ICD-10-CM | POA: Diagnosis not present

## 2022-01-14 DIAGNOSIS — R0600 Dyspnea, unspecified: Secondary | ICD-10-CM | POA: Diagnosis not present

## 2022-01-28 DIAGNOSIS — I10 Essential (primary) hypertension: Secondary | ICD-10-CM | POA: Diagnosis not present

## 2022-01-28 DIAGNOSIS — G4733 Obstructive sleep apnea (adult) (pediatric): Secondary | ICD-10-CM | POA: Diagnosis not present

## 2022-03-17 DIAGNOSIS — N1831 Chronic kidney disease, stage 3a: Secondary | ICD-10-CM | POA: Diagnosis not present

## 2022-03-17 DIAGNOSIS — G629 Polyneuropathy, unspecified: Secondary | ICD-10-CM | POA: Diagnosis not present

## 2022-03-17 DIAGNOSIS — M797 Fibromyalgia: Secondary | ICD-10-CM | POA: Diagnosis not present

## 2022-03-17 DIAGNOSIS — R079 Chest pain, unspecified: Secondary | ICD-10-CM | POA: Diagnosis not present

## 2022-03-17 DIAGNOSIS — R0600 Dyspnea, unspecified: Secondary | ICD-10-CM | POA: Diagnosis not present

## 2022-03-17 DIAGNOSIS — G4733 Obstructive sleep apnea (adult) (pediatric): Secondary | ICD-10-CM | POA: Diagnosis not present

## 2022-03-17 DIAGNOSIS — R030 Elevated blood-pressure reading, without diagnosis of hypertension: Secondary | ICD-10-CM | POA: Diagnosis not present

## 2022-03-17 DIAGNOSIS — E7849 Other hyperlipidemia: Secondary | ICD-10-CM | POA: Diagnosis not present

## 2022-03-17 DIAGNOSIS — J019 Acute sinusitis, unspecified: Secondary | ICD-10-CM | POA: Diagnosis not present

## 2022-03-19 NOTE — Progress Notes (Deleted)
Office Visit    Patient Name: [name] Date of Encounter: 03/19/2022  Primary Care Provider:  Curlene Labrum, MD Primary Cardiologist:  Fransico Him, MD Primary Electrophysiologist: None  Chief Complaint    [name] is a 72 y.o. female with PMH of HTN, HLD, fibromyalgia, anxiety, depression who presents today for complaint of atypical chest pain.  Past Medical History    Past Medical History:  Diagnosis Date   Anxiety    Arthritis    hands, feet   Back pain    chronic   Blurred vision    hx of   Cataracts, bilateral    MD just watching right now   Complication of anesthesia    pt states at preop after back surgery she had to sit up all nite ? related to sleep apnea pt not  sure   Cystitides, interstitial, chronic    Depression    Fibromyalgia    GERD (gastroesophageal reflux disease)    Gout    feet   High cholesterol    Hypertension    Interstitial cystitis    hx of   Neuropathy    bilateral feet   Pneumonia    Shortness of breath    with exertion   Sleep apnea    Uses appliance; used CPAP    SVD (spontaneous vaginal delivery)    x 3   Urinary disorder    bladder is in sling per pt   Urinary tract infection    Vertigo    Vertigo    hx of   Wears glasses    Yeast infection    hx of multiple yeast infections per pt   Past Surgical History:  Procedure Laterality Date   ABDOMINAL HYSTERECTOMY  1997   [place] [facility]   ANTERIOR LAT LUMBAR FUSION Right 04/28/2018   Procedure: ANTERIOR LATERAL INNER BODY LUMBAR FUSION L3-4, L4-5 2 LEVELS WITH INSTRUMENTATION AND ALLOGRAFT;  Surgeon: Phylliss Bob, MD;  Location: [facility];  Service: Orthopedics;  Laterality: Right;   BACK SURGERY  10/20/2017   bladder tack     COLONOSCOPY N/A 08/23/2014   Procedure: COLONOSCOPY;  Surgeon: Rogene Houston, MD;  Location: [facility];  Service: Endoscopy;  Laterality: N/A;  1200   CYSTO WITH HYDRODISTENSION N/A 04/18/2019   Procedure:  CYSTOSCOPY/HYDRODISTENSION;  Surgeon: Cleon Gustin, MD;  Location: [facility];  Service: Urology;  Laterality: N/A;   KNEE SURGERY Left 05/26/14   meniscus tear   LAPAROSCOPIC SALPINGO OOPHERECTOMY  02/17/2012   Procedure: LAPAROSCOPIC SALPINGO OOPHORECTOMY;  Surgeon: Jonnie Kind, MD;  Location: [facility];  Service: Gynecology;  Laterality: Bilateral;   LASIK Bilateral    TOE SURGERY     TOTAL KNEE ARTHROPLASTY Left 07/09/2021   Procedure: TOTAL KNEE ARTHROPLASTY;  Surgeon: Marchia Bond, MD;  Location: [facility];  Service: Orthopedics;  Laterality: Left;   UPPER GI ENDOSCOPY      Allergies  No Known Allergies  History of Present Illness    [name]  is a 72 year old female with the above mention past medical history who presents today for complaint of atypical chest pain.  Most recent was seen initially in 2012 when she presented to the ED with complaint of exertional chest pain for 1 month.  She completed a Lexiscan Myoview that was normal.  She was first seen by Dr. Radford Pax on 11/17/2019 by request of her PCP for evaluation of abnormal stress test.  She reported  extreme fatigue for 8 months and had a nuclear stress test ordered by her primary care doctor that showed a large region of moderate reversibility in the anterior wall concerning for anterior ischemia.  Cardiac CTA was ordered and showed no evidence of CAD with a score of 0.  2D echo was completed showing EF of 65 to 70% with grade 1 DD and normal RV systolic function and no significant valvular dysfunction.  She was last seen for surgical clearance 05/23/2021 by Nell Range, PA.  During visit patient had pain all over her body from fibromyalgia.   Since last being seen in the office patient reports***.  Patient denies chest pain, palpitations, dyspnea, PND, orthopnea, nausea, vomiting, dizziness, syncope, edema, weight gain, or early satiety.     ***Notes: -/Last stress test was 2012 -Currently on Norvasc 5 mg Home  Medications    Current Outpatient Medications  Medication Sig Dispense Refill   acetaminophen (TYLENOL) 500 MG tablet Take 500 mg by mouth every 6 (six) hours as needed for moderate pain.     ALPRAZolam (XANAX) 0.5 MG tablet Take 0.25-0.5 mg by mouth 2 (two) times daily.  5   amLODipine (NORVASC) 5 MG tablet Take 5 mg by mouth daily.     aspirin EC 325 MG tablet Take 1 tablet (325 mg total) by mouth 2 (two) times daily. 60 tablet 0   baclofen (LIORESAL) 10 MG tablet Take 1 tablet (10 mg total) by mouth 3 (three) times daily. As needed for muscle spasm 50 tablet 0   CAMPHOR-MENTHOL EX Apply 1 application topically 3 (three) times daily as needed (for pain).      carvedilol (COREG) 25 MG tablet TAKE ONE TABLET BY MOUTH TWICE DAILY 180 tablet 3   chlorthalidone (HYGROTON) 25 MG tablet TAKE ONE TABLET BY MOUTH EVERY MORNING 90 tablet 3   DULoxetine (CYMBALTA) 60 MG capsule Take 1 capsule (60 mg total) by mouth 2 (two) times daily. 60 capsule 2   fluticasone (FLONASE) 50 MCG/ACT nasal spray Place 1 spray into both nostrils daily as needed for allergies.      meclizine (ANTIVERT) 25 MG tablet TAKE 1 TABLET BY MOUTH EVERY 6 HOURS AS NEEDED FOR DIZZINESS     Naphazoline-Pheniramine (OPCON-A OP) Place 1 drop into both eyes 3 (three) times daily as needed (dry/irritated eyes.).     NON FORMULARY Pt uses a cpap nightly     olmesartan (BENICAR) 40 MG tablet TAKE ONE TABLET BY MOUTH ONCE DAILY 90 tablet 3   ondansetron (ZOFRAN) 4 MG tablet Take 1 tablet (4 mg total) by mouth every 8 (eight) hours as needed for nausea or vomiting. 10 tablet 0   OVER THE COUNTER MEDICATION Take 2 tablets by mouth daily. GOLI Gummies     OVER THE COUNTER MEDICATION Take 1 Package by mouth in the morning and at bedtime. Ketones Electrolytes     Oxycodone HCl 10 MG TABS Take 0.5 tablets (5 mg total) by mouth every 4 (four) hours as needed (severe pain). 20 tablet 0   polyethylene glycol (MIRALAX) 17 g packet Take 17 g by mouth  daily. Dissolve one cap full in solution (water, gatorade, etc.) and administer once cap-full daily. You may titrate up daily by 1 cap-full until the patient is having pudding consistency of stools. After the patient is able to start passing softer stools they they can continue to take 1/2 cap-full daily for 2 weeks. (Patient not taking: Reported on 06/28/2021) 14 each 0  Probiotic Product (PROBIOTIC DAILY PO) Take 1 capsule by mouth daily.      sennosides-docusate sodium (SENOKOT-S) 8.6-50 MG tablet Take 2 tablets by mouth daily. 30 tablet 1   simvastatin (ZOCOR) 20 MG tablet Take 20 mg by mouth at bedtime.     VITAMIN D PO Take 1 tablet by mouth daily.     No current facility-administered medications for this visit.     Review of Systems  Please see the history of present illness.    (+)*** (+)***  All other systems reviewed and are otherwise negative except as noted above.  Physical Exam    Wt Readings from Last 3 Encounters:  07/02/21 188 lb (85.3 kg)  05/23/21 195 lb 12.8 oz (88.8 kg)  11/17/19 205 lb 9.6 oz (93.3 kg)   [id number] were no vitals filed for this visit.,There is no height or weight on file to calculate BMI.  Constitutional:      Appearance: Healthy appearance. Not in distress.  Neck:     Vascular: JVD normal.  Pulmonary:     Effort: Pulmonary effort is normal.     Breath sounds: No wheezing. No rales. Diminished in the bases Cardiovascular:     Normal rate. Regular rhythm. Normal S1. Normal S2.      Murmurs: There is no murmur.  Edema:    Peripheral edema absent.  Abdominal:     Palpations: Abdomen is soft non tender. There is no hepatomegaly.  Skin:    General: Skin is warm and dry.  Neurological:     General: No focal deficit present.     Mental Status: Alert and oriented to person, place and time.     Cranial Nerves: Cranial nerves are intact.  EKG/LABS/ Recent Cardiac Studies    ECG personally reviewed by me today - ***  Risk  Assessment/Calculations:   {Does this patient have ATRIAL FIBRILLATION?:[id number]}        Lab Results  Component Value Date   WBC 6.8 07/02/2021   HGB 12.4 07/02/2021   HCT 36.6 07/02/2021   MCV 88.4 07/02/2021   PLT 306 07/02/2021   Lab Results  Component Value Date   CREATININE 1.13 (H) 07/02/2021   BUN 15 07/02/2021   NA 133 (L) 07/02/2021   K 3.5 07/02/2021   CL 96 (L) 07/02/2021   CO2 28 07/02/2021   Lab Results  Component Value Date   ALT 21 05/11/2019   AST 17 05/11/2019   ALKPHOS 57 05/11/2019   BILITOT 0.7 05/11/2019   Lab Results  Component Value Date   CHOL (H) 03/06/2010    214        ATP III CLASSIFICATION:  <200     mg/dL   Desirable  200-239  mg/dL   Borderline High  >=240    mg/dL   High          HDL 44 03/06/2010   LDLCALC (H) 03/06/2010    137        Total Cholesterol/HDL:CHD Risk Coronary Heart Disease Risk Table                     Men   Women  1/2 Average Risk   3.4   3.3  Average Risk       5.0   4.4  2 X Average Risk   9.6   7.1  3 X Average Risk  23.4   11.0        Use the calculated Patient  Ratio above and the CHD Risk Table to determine the patient's CHD Risk.        ATP III CLASSIFICATION (LDL):  <100     mg/dL   Optimal  100-129  mg/dL   Near or Above                    Optimal  130-159  mg/dL   Borderline  160-189  mg/dL   High  >190     mg/dL   Very High   TRIG 167 (H) 03/06/2010   CHOLHDL 4.9 03/06/2010    Lab Results  Component Value Date   HGBA1C 5.9 (H) 05/23/2013    Cardiac Studies & Procedures          CT SCANS  CT CORONARY MORPH W/CTA COR W/SCORE 12/05/2019  Addendum 12/05/2019 10:18 AM ADDENDUM REPORT: 12/05/2019 10:16  EXAM: Cardiac/Coronary  CT  TECHNIQUE: The patient was scanned on a [employer].  FINDINGS: A 120 kV prospective scan was triggered in the descending thoracic aorta at 111 HU's. Axial non-contrast 3 mm slices were carried out through the heart. The data set was  analyzed on a dedicated work station and scored using the [employer]. Gantry rotation speed was 250 msecs and collimation was .6 mm. No beta blockade and 0.8 mg of sl NTG was given. The 3D data set was reconstructed in 5% intervals of the 67-82 % of the R-R cycle. Diastolic phases were analyzed on a dedicated work station using MPR, MIP and VRT modes. The patient received 80 cc of contrast.  Aorta: Normal size. Mild ascending aortic calcifications. No dissection.  Aortic Valve:  Trileaflet.  Minimal calcifications.  Coronary Arteries:  Normal coronary origin.  Right dominance.  RCA is a large dominant artery that gives rise to PDA and PLVB. There is no plaque.  Left main is a large artery that gives rise to LAD and LCX arteries. There is no plaque.  LAD is a large vessel that gives rise to a moderate sized D1. There is no plaque.  LCX is a non-dominant artery that gives rise to one large OM1 branch. There is no plaque.  Other findings:  Normal pulmonary vein drainage into the left atrium.  Normal let atrial appendage without a thrombus.  Normal size of the pulmonary artery.  Possible small PFO.  IMPRESSION: 1. Coronary calcium score of 0. This was 0 percentile for age and sex matched control.  2.  Normal coronary origin with right dominance.  3.  No evidence of CAD.  CAD RADS 0.  4.  Consider no atherosclerotic causes of chest pain.  Fransico Him   Electronically Signed By: Fransico Him On: 12/05/2019 10:16  Narrative EXAM: OVER-READ INTERPRETATION  CT CHEST  The following report is an over-read performed by radiologist Dr. Vinnie Langton of [facility], [place] on 12/02/2019. This over-read does not include interpretation of cardiac or coronary anatomy or pathology. The coronary calcium score/coronary CTA interpretation by the cardiologist is attached.  COMPARISON:  None.  FINDINGS: Aortic atherosclerosis. Within the visualized portions of  the thorax there are no suspicious appearing pulmonary nodules or masses, there is no acute consolidative airspace disease, no pleural effusions, no pneumothorax and no lymphadenopathy. Visualized portions of the upper abdomen are unremarkable. There are no aggressive appearing lytic or blastic lesions noted in the visualized portions of the skeleton.  IMPRESSION: 1.  Aortic Atherosclerosis (ICD10-I70.0).  Electronically Signed: By: Vinnie Langton M.D. On: 12/02/2019 08:28  Assessment & Plan    1.  Atypical chest pain: -Today patient reports***  2.  Nonobstructive CAD: -Patient had last ischemic evaluation completed in 2021 by cardiac CT following a false positive stress test.  Calcium score was 0. -Today she reports***  3.  Essential hypertension: -Patient's blood pressure today was***  4.  Hyperlipidemia: -Patient's last LDL cholesterol was***      Disposition: Follow-up with Fransico Him, MD or APP in *** months {Are you ordering a CV Procedure (e.g. stress test, cath, DCCV, TEE, etc)?   Press F2        :[id number]   Medication Adjustments/Labs and Tests Ordered: Current medicines are reviewed at length with the patient today.  Concerns regarding medicines are outlined above.   Signed, Mable Fill, Marissa Nestle, NP 03/19/2022, 7:02 PM [facility]  Note:  This document was prepared using Dragon voice recognition software and may include unintentional dictation errors.

## 2022-03-21 ENCOUNTER — Ambulatory Visit: Payer: Medicare Other | Admitting: Nurse Practitioner

## 2022-03-31 ENCOUNTER — Encounter (HOSPITAL_COMMUNITY): Payer: Self-pay

## 2022-03-31 ENCOUNTER — Other Ambulatory Visit: Payer: Self-pay

## 2022-03-31 ENCOUNTER — Emergency Department (HOSPITAL_COMMUNITY)
Admission: EM | Admit: 2022-03-31 | Discharge: 2022-03-31 | Disposition: A | Discharge status: Home / Self Care | Payer: Medicare Other | Attending: Emergency Medicine | Admitting: Emergency Medicine

## 2022-03-31 ENCOUNTER — Emergency Department (HOSPITAL_COMMUNITY): Payer: Medicare Other

## 2022-03-31 DIAGNOSIS — R1011 Right upper quadrant pain: Secondary | ICD-10-CM

## 2022-03-31 DIAGNOSIS — K8020 Calculus of gallbladder without cholecystitis without obstruction: Secondary | ICD-10-CM | POA: Diagnosis not present

## 2022-03-31 LAB — URINALYSIS, ROUTINE W REFLEX MICROSCOPIC
Bilirubin Urine: NEGATIVE
Glucose, UA: NEGATIVE mg/dL
Hgb urine dipstick: NEGATIVE
Ketones, ur: NEGATIVE mg/dL
Nitrite: NEGATIVE
Protein, ur: NEGATIVE mg/dL
Specific Gravity, Urine: 1.005 (ref 1.005–1.030)
pH: 7 (ref 5.0–8.0)

## 2022-03-31 LAB — COMPREHENSIVE METABOLIC PANEL
ALT: 18 U/L (ref 0–44)
AST: 15 U/L (ref 15–41)
Albumin: 4.1 g/dL (ref 3.5–5.0)
Alkaline Phosphatase: 56 U/L (ref 38–126)
Anion gap: 9 (ref 5–15)
BUN: 18 mg/dL (ref 8–23)
CO2: 28 mmol/L (ref 22–32)
Calcium: 9.1 mg/dL (ref 8.9–10.3)
Chloride: 93 mmol/L — ABNORMAL LOW (ref 98–111)
Creatinine, Ser: 0.79 mg/dL (ref 0.44–1.00)
GFR, Estimated: >60 mL/min (ref >60)
Glucose, Bld: 95 mg/dL (ref 70–99)
Potassium: 3.9 mmol/L (ref 3.5–5.1)
Sodium: 130 mmol/L — ABNORMAL LOW (ref 135–145)
Total Bilirubin: 0.7 mg/dL (ref 0.3–1.2)
Total Protein: 7.3 g/dL (ref 6.5–8.1)

## 2022-03-31 LAB — CBC
HCT: 38.1 % (ref 36.0–46.0)
Hemoglobin: 12.7 g/dL (ref 12.0–15.0)
MCH: 29.9 pg (ref 26.0–34.0)
MCHC: 33.3 g/dL (ref 30.0–36.0)
MCV: 89.6 fL (ref 80.0–100.0)
Platelets: 286 10*3/uL (ref 150–400)
RBC: 4.25 MIL/uL (ref 3.87–5.11)
RDW: 12.9 % (ref 11.5–15.5)
WBC: 9.9 10*3/uL (ref 4.0–10.5)
nRBC: 0 % (ref 0.0–0.2)

## 2022-03-31 LAB — TROPONIN I (HIGH SENSITIVITY): Troponin I (High Sensitivity): 3 ng/L (ref <18)

## 2022-03-31 LAB — LIPASE, BLOOD: Lipase: 80 U/L — ABNORMAL HIGH (ref 11–51)

## 2022-03-31 NOTE — ED Provider Notes (Signed)
[facility] Provider Note   CSN: [id number] Arrival date & time: 03/31/22  1433     History  Chief Complaint  Patient presents with   Abdominal Pain    [name] is a 72 y.o. female.   Abdominal Pain  This patient is a 72 year old female, she has a prior history of some type of gallbladder disease though she states it was years ago, they told her she needed it out but she did not want to at that time.  She was shopping at a store today when she developed pain under the left rib cage which seem to radiate up into the left chest and over to the right side.  When it was in the right side she went to her family doctor's office at [facility] in [place], they recommended that she come to the hospital for evaluation of her gallbladder, she states that her symptoms have all but gone away at this point.  She is not nauseated.    Home Medications Prior to Admission medications   Medication Sig Start Date End Date Taking? Authorizing Provider  acetaminophen (TYLENOL) 500 MG tablet Take 500 mg by mouth every 6 (six) hours as needed for moderate pain.   Yes [provider]  ALPRAZolam (XANAX) 0.5 MG tablet Take 0.25-0.5 mg by mouth 2 (two) times daily. 08/13/17  Yes [provider]  amLODipine (NORVASC) 5 MG tablet Take 5 mg by mouth daily. 04/09/21  Yes [provider]  CAMPHOR-MENTHOL EX Apply 1 application topically 3 (three) times daily as needed (for pain).    Yes [provider]  carvedilol (COREG) 25 MG tablet TAKE ONE TABLET BY MOUTH TWICE DAILY 07/08/21  Yes Eileen Stanford, PA-C  chlorthalidone (HYGROTON) 25 MG tablet TAKE ONE TABLET BY MOUTH EVERY MORNING 07/08/21  Yes Eileen Stanford, PA-C  DULoxetine (CYMBALTA) 60 MG capsule Take 1 capsule (60 mg total) by mouth 2 (two) times daily. 08/01/16  Yes Cloria Spring, MD  fluticasone [facility]) 50 MCG/ACT nasal spray Place 1 spray into both nostrils daily  as needed for allergies.    Yes [provider]  montelukast (SINGULAIR) 10 MG tablet Take 10 mg by mouth daily. 03/03/22  Yes [provider]  Naphazoline-Pheniramine (OPCON-A OP) Place 1 drop into both eyes 3 (three) times daily as needed (dry/irritated eyes.).   Yes [provider]  NON FORMULARY Pt uses a cpap nightly   Yes [provider]  olmesartan (BENICAR) 40 MG tablet TAKE ONE TABLET BY MOUTH ONCE DAILY 07/08/21  Yes Eileen Stanford, PA-C  Probiotic Product (PROBIOTIC DAILY PO) Take 1 capsule by mouth daily.    Yes [provider]  sennosides-docusate sodium (SENOKOT-S) 8.6-50 MG tablet Take 2 tablets by mouth daily. 07/09/21  Yes McBane, Maylene Roes, PA-C  simvastatin (ZOCOR) 20 MG tablet Take 20 mg by mouth at bedtime.   Yes [provider]  VITAMIN D PO Take 1 tablet by mouth daily.   Yes [provider]      Allergies    Patient has no known allergies.    Review of Systems   Review of Systems  Gastrointestinal:  Positive for abdominal pain.  All other systems reviewed and are negative.   Physical Exam Updated Vital Signs BP (!) 143/66   Pulse 60   Temp (!) 97.5 F (36.4 C)   Resp 16   Ht 1.651 m (5\' 5" )   Wt 88 kg  LMP  (LMP Unknown)   SpO2 100%   BMI 32.28 kg/m  Physical Exam Vitals and nursing note reviewed.  Constitutional:      General: She is not in acute distress.    Appearance: She is well-developed.  HENT:     Head: Normocephalic and atraumatic.     Mouth/Throat:     Pharynx: No oropharyngeal exudate.  Eyes:     General: No scleral icterus.       Right eye: No discharge.        Left eye: No discharge.     Conjunctiva/sclera: Conjunctivae normal.     Pupils: Pupils are equal, round, and reactive to light.  Neck:     Thyroid: No thyromegaly.     Vascular: No JVD.  Cardiovascular:     Rate and Rhythm: Normal rate and regular rhythm.     Heart sounds: Normal heart sounds. No murmur  heard.    No friction rub. No gallop.  Pulmonary:     Effort: Pulmonary effort is normal. No respiratory distress.     Breath sounds: Normal breath sounds. No wheezing or rales.  Abdominal:     General: Bowel sounds are normal. There is no distension.     Palpations: Abdomen is soft. There is no mass.     Tenderness: There is no abdominal tenderness.     Comments: Nontender abdomen, no Murphy sign  Musculoskeletal:        General: No tenderness. Normal range of motion.     Cervical back: Normal range of motion and neck supple.     Right lower leg: No edema.     Left lower leg: No edema.  Lymphadenopathy:     Cervical: No cervical adenopathy.  Skin:    General: Skin is warm and dry.     Findings: No erythema or rash.  Neurological:     Mental Status: She is alert.     Coordination: Coordination normal.  Psychiatric:        Behavior: Behavior normal.     ED Results / Procedures / Treatments   Labs (all labs ordered are listed, but only abnormal results are displayed) Labs Reviewed  LIPASE, BLOOD - Abnormal; Notable for the following components:      Result Value   Lipase 80 (*)    All other components within normal limits  COMPREHENSIVE METABOLIC PANEL - Abnormal; Notable for the following components:   Sodium 130 (*)    Chloride 93 (*)    All other components within normal limits  URINALYSIS, ROUTINE W REFLEX MICROSCOPIC - Abnormal; Notable for the following components:   Color, Urine STRAW (*)    Leukocytes,Ua TRACE (*)    Bacteria, UA RARE (*)    All other components within normal limits  CBC  TROPONIN I (HIGH SENSITIVITY)    EKG EKG Interpretation  Date/Time:  Monday March 31 2022 16:56:51 EDT Ventricular Rate:  62 PR Interval:  221 QRS Duration: 112 QT Interval:  414 QTC Calculation: 421 R Axis:   47 Text Interpretation: Sinus rhythm Prolonged PR interval Borderline intraventricular conduction delay Minimal ST depression, inferior leads Confirmed by  Noemi Chapel [phone]) on 03/31/2022 5:01:36 PM  Radiology US Abdomen Limited RUQ (LIVER/GB)  Result Date: 03/31/2022 CLINICAL DATA:  Right upper quadrant abdominal pain. EXAM: ULTRASOUND ABDOMEN LIMITED RIGHT UPPER QUADRANT COMPARISON:  CT abdomen and pelvis 05/11/2019 FINDINGS: Gallbladder: There is a 1.9 cm echogenic shadowing gallstone, similar to prior CT. No gallbladder wall thickening or  pericholecystic fluid. Negative sonographic Murphy's sign. Common bile duct: Diameter: 8 mm, at the upper limits of normal for patient age. This appears unchanged from 05/11/2019 CT, within limitations of differences in modality. No intrahepatic or extrahepatic biliary ductal dilatation is seen. Liver: Smooth liver contours. Slightly increased hepatic echogenicity as can be seen with mild fatty infiltration. No focal liver lesion is seen. Portal vein is patent on color Doppler imaging with normal direction of blood flow towards the liver. Other: None. IMPRESSION: 1. Cholelithiasis without evidence of acute cholecystitis. 2. Common bile duct at the upper limits of normal for patient age, unchanged from 05/11/2019 CT. 3. Slightly increased hepatic echogenicity as can be seen with mild fatty infiltration. Electronically Signed   By: Yvonne Kendall M.D.   On: 03/31/2022 16:35    Procedures Procedures    Medications Ordered in ED Medications - No data to display  ED Course/ Medical Decision Making/ A&P                             Medical Decision Making Amount and/or Complexity of Data Reviewed Labs: ordered. Radiology: ordered. ECG/medicine tests: ordered.   This patient presents to the ED for concern of abdominal discomfort but also had some chest discomfort, this involves an extensive number of treatment options, and is a complaint that carries with it a high risk of complications and morbidity.  The differential diagnosis includes cardiac disease, gallbladder disease, less likely to be pulmonary, less  likely to be pulmonary embolism given oxygen of 100% and a pulse of 60 on room air   Co morbidities that complicate the patient evaluation  Overweight, prior history of gallbladder disease   Additional history obtained:  Additional history obtained from the medical record External records from outside source obtained and reviewed including multiple office visits, no recent admissions to the hospital, admitted for knee joint replacement last year   Lab Tests:  I Ordered, and personally interpreted labs.  The pertinent results include: Urinalysis without findings, troponin normal, lipase minimally elevated at 80 but still within a normal range, labs with minimal hyponatremia, no leukocytosis, no anemia and normal liver function.  Alkaline phosphatase is normal, bili is normal.   Imaging Studies ordered:  I ordered imaging studies including ultrasound of the gallbladder I independently visualized and interpreted imaging which showed cholelithiasis without signs of cholecystitis I agree with the radiologist interpretation   Cardiac Monitoring: / EKG:  The patient was maintained on a cardiac monitor.  I personally viewed and interpreted the cardiac monitored which showed an underlying rhythm of: Normal sinus rhythm  Problem List / ED Course / Critical interventions / Medication management  Patient is remained asymptomatic, reviewed her labs with her as well as the ultrasound, referred to general surgery as outpatient, she is agreeable I have reviewed the patients home medicines and have made adjustments as needed   Social Determinants of Health:  Well-appearing at the time of discharge   Test / Admission - Considered:  Considered admission but patient improved, no signs of cholecystitis         Final Clinical Impression(s) / ED Diagnoses Final diagnoses:  Right upper quadrant abdominal pain  Symptomatic cholelithiasis    Rx / DC Orders ED Discharge Orders      None         Noemi Chapel, MD 03/31/22 1850

## 2022-03-31 NOTE — Discharge Instructions (Addendum)
Please follow-up with the general surgeon, Dr. Arnoldo Morale or one of his partners in the office, please make a phone call to follow-up within the next couple of days.  At the minimum you should be seen within the week.  ER for severe worsening pain or vomiting.

## 2022-03-31 NOTE — ED Triage Notes (Signed)
Pt reports RUQ pain with a hx of needing gall bladder surgery.

## 2022-04-08 DIAGNOSIS — R739 Hyperglycemia, unspecified: Secondary | ICD-10-CM | POA: Diagnosis not present

## 2022-04-08 DIAGNOSIS — Z1329 Encounter for screening for other suspected endocrine disorder: Secondary | ICD-10-CM | POA: Diagnosis not present

## 2022-04-08 DIAGNOSIS — N1831 Chronic kidney disease, stage 3a: Secondary | ICD-10-CM | POA: Diagnosis not present

## 2022-04-08 DIAGNOSIS — E7849 Other hyperlipidemia: Secondary | ICD-10-CM | POA: Diagnosis not present

## 2022-04-08 DIAGNOSIS — Z0001 Encounter for general adult medical examination with abnormal findings: Secondary | ICD-10-CM | POA: Diagnosis not present

## 2022-04-08 DIAGNOSIS — E039 Hypothyroidism, unspecified: Secondary | ICD-10-CM | POA: Diagnosis not present

## 2022-04-08 DIAGNOSIS — E559 Vitamin D deficiency, unspecified: Secondary | ICD-10-CM | POA: Diagnosis not present

## 2022-04-14 DIAGNOSIS — M797 Fibromyalgia: Secondary | ICD-10-CM | POA: Diagnosis not present

## 2022-04-14 DIAGNOSIS — E7849 Other hyperlipidemia: Secondary | ICD-10-CM | POA: Diagnosis not present

## 2022-04-14 DIAGNOSIS — G4733 Obstructive sleep apnea (adult) (pediatric): Secondary | ICD-10-CM | POA: Diagnosis not present

## 2022-04-14 DIAGNOSIS — Z0001 Encounter for general adult medical examination with abnormal findings: Secondary | ICD-10-CM | POA: Diagnosis not present

## 2022-04-14 DIAGNOSIS — E871 Hypo-osmolality and hyponatremia: Secondary | ICD-10-CM | POA: Diagnosis not present

## 2022-04-14 DIAGNOSIS — N3010 Interstitial cystitis (chronic) without hematuria: Secondary | ICD-10-CM | POA: Diagnosis not present

## 2022-04-14 DIAGNOSIS — G629 Polyneuropathy, unspecified: Secondary | ICD-10-CM | POA: Diagnosis not present

## 2022-04-14 DIAGNOSIS — K819 Cholecystitis, unspecified: Secondary | ICD-10-CM | POA: Diagnosis not present

## 2022-04-14 DIAGNOSIS — N1831 Chronic kidney disease, stage 3a: Secondary | ICD-10-CM | POA: Diagnosis not present

## 2022-04-14 DIAGNOSIS — R030 Elevated blood-pressure reading, without diagnosis of hypertension: Secondary | ICD-10-CM | POA: Diagnosis not present

## 2022-04-16 ENCOUNTER — Encounter: Payer: Self-pay | Admitting: Nurse Practitioner

## 2022-04-17 ENCOUNTER — Ambulatory Visit: Payer: Medicare Other | Admitting: Nurse Practitioner

## 2022-04-17 NOTE — Progress Notes (Deleted)
Office Visit    Patient Name: [name] Date of Encounter: 04/17/2022  PCP:  Curlene Labrum, MD   [place]  Cardiologist:  Fransico Him, MD *** Advanced Practice Provider:  No care team member to display Electrophysiologist:  None  {Press F2 to show EP APP, CHF, sleep or structural heart MD               :[id number]  { Click here to update then REFRESH NOTE - MD (PCP) or APP (Team Member)  Change PCP Type for MD, Specialty for APP is either Cardiology or Clinical Cardiac Electrophysiology  :[id number]  Chief Complaint    [name] is a 72 y.o. female with a hx of hypertension, hyperlipidemia, fibromyalgia, OSA on CPAP, fatigue and depression, who presents today for chest pain evaluation.    Past Medical History    Past Medical History:  Diagnosis Date   Anxiety    Arthritis    hands, feet   Back pain    chronic   Blurred vision    hx of   Cataracts, bilateral    MD just watching right now   Complication of anesthesia    pt states at preop after back surgery she had to sit up all nite ? related to sleep apnea pt not  sure   Cystitides, interstitial, chronic    Depression    Fibromyalgia    GERD (gastroesophageal reflux disease)    Gout    feet   High cholesterol    Hypertension    Interstitial cystitis    hx of   Neuropathy    bilateral feet   Pneumonia    Shortness of breath    with exertion   Sleep apnea    Uses appliance; used CPAP    SVD (spontaneous vaginal delivery)    x 3   Urinary disorder    bladder is in sling per pt   Urinary tract infection    Vertigo    Vertigo    hx of   Wears glasses    Yeast infection    hx of multiple yeast infections per pt   Past Surgical History:  Procedure Laterality Date   ABDOMINAL HYSTERECTOMY  1997   [place] [facility]   ANTERIOR LAT LUMBAR FUSION Right 04/28/2018   Procedure: ANTERIOR LATERAL INNER BODY LUMBAR FUSION L3-4, L4-5 2 LEVELS WITH INSTRUMENTATION AND  ALLOGRAFT;  Surgeon: Phylliss Bob, MD;  Location: [facility];  Service: Orthopedics;  Laterality: Right;   BACK SURGERY  10/20/2017   bladder tack     COLONOSCOPY N/A 08/23/2014   Procedure: COLONOSCOPY;  Surgeon: Rogene Houston, MD;  Location: [facility];  Service: Endoscopy;  Laterality: N/A;  1200   CYSTO WITH HYDRODISTENSION N/A 04/18/2019   Procedure: CYSTOSCOPY/HYDRODISTENSION;  Surgeon: Cleon Gustin, MD;  Location: [facility];  Service: Urology;  Laterality: N/A;   KNEE SURGERY Left 05/26/14   meniscus tear   LAPAROSCOPIC SALPINGO OOPHERECTOMY  02/17/2012   Procedure: LAPAROSCOPIC SALPINGO OOPHORECTOMY;  Surgeon: Jonnie Kind, MD;  Location: [facility];  Service: Gynecology;  Laterality: Bilateral;   LASIK Bilateral    TOE SURGERY     TOTAL KNEE ARTHROPLASTY Left 07/09/2021   Procedure: TOTAL KNEE ARTHROPLASTY;  Surgeon: Marchia Bond, MD;  Location: [facility];  Service: Orthopedics;  Laterality: Left;   UPPER GI ENDOSCOPY      Allergies  No Known Allergies  History of Present Illness  [name] is a 72 y.o. female with a PMH as mentioned above.  Previous NST was considered high risk, large region of moderate reversibility in anterior wall, anteroseptum and anterolateral walls concerning for anterior ischemia.  Echo revealed normal EF with mild aortic valve sclerosis, mild LAR, grade 1 DD, trivial   EKGs/Labs/Other Studies Reviewed:   The following studies were reviewed today: ***  EKG:  EKG is *** ordered today.  The ekg ordered today demonstrates ***  Recent Labs: 03/31/2022: ALT 18; BUN 18; Creatinine, Ser 0.79; Hemoglobin 12.7; Platelets 286; Potassium 3.9; Sodium 130  Recent Lipid Panel    Component Value Date/Time   CHOL (H) 03/06/2010 0444    214        ATP III CLASSIFICATION:  <200     mg/dL   Desirable  200-239  mg/dL   Borderline High  >=240    mg/dL   High          TRIG 167 (H) 03/06/2010 0444   HDL 44 03/06/2010 0444   CHOLHDL 4.9 03/06/2010 0444    VLDL 33 03/06/2010 0444   LDLCALC (H) 03/06/2010 0444    137        Total Cholesterol/HDL:CHD Risk Coronary Heart Disease Risk Table                     Men   Women  1/2 Average Risk   3.4   3.3  Average Risk       5.0   4.4  2 X Average Risk   9.6   7.1  3 X Average Risk  23.4   11.0        Use the calculated Patient Ratio above and the CHD Risk Table to determine the patient's CHD Risk.        ATP III CLASSIFICATION (LDL):  <100     mg/dL   Optimal  100-129  mg/dL   Near or Above                    Optimal  130-159  mg/dL   Borderline  160-189  mg/dL   High  >190     mg/dL   Very High    Risk Assessment/Calculations:  {Does this patient have ATRIAL FIBRILLATION?:[id number]}  Home Medications   No outpatient medications have been marked as taking for the 04/17/22 encounter (Appointment) with Finis Bud, NP.     Review of Systems   ***   All other systems reviewed and are otherwise negative except as noted above.  Physical Exam    VS:  LMP  (LMP Unknown)  , BMI There is no height or weight on file to calculate BMI.  Wt Readings from Last 3 Encounters:  03/31/22 194 lb (88 kg)  07/02/21 188 lb (85.3 kg)  05/23/21 195 lb 12.8 oz (88.8 kg)     GEN: Well nourished, well developed, in no acute distress. HEENT: normal. Neck: Supple, no JVD, carotid bruits, or masses. Cardiac: ***RRR, no murmurs, rubs, or gallops. No clubbing, cyanosis, edema.  ***Radials/PT 2+ and equal bilaterally.  Respiratory:  ***Respirations regular and unlabored, clear to auscultation bilaterally. GI: Soft, nontender, nondistended. MS: No deformity or atrophy. Skin: Warm and dry, no rash. Neuro:  Strength and sensation are intact. Psych: Normal affect.  Assessment & Plan    ***  {Are you ordering a CV Procedure (e.g. stress test, cath, DCCV, TEE, etc)?   Press F2        :  [id number]      Disposition: Follow up {follow up:[id number]} with Fransico Him, MD or APP.  Signed, Finis Bud, NP 04/17/2022, 2:31 PM [facility]

## 2022-04-21 DIAGNOSIS — Z1231 Encounter for screening mammogram for malignant neoplasm of breast: Secondary | ICD-10-CM | POA: Diagnosis not present

## 2022-04-22 ENCOUNTER — Ambulatory Visit: Payer: Medicare Other | Attending: Nurse Practitioner | Admitting: Student

## 2022-04-22 ENCOUNTER — Encounter: Payer: Self-pay | Admitting: Student

## 2022-04-22 VITALS — BP 118/66 | HR 72 | Ht 65.5 in | Wt 203.0 lb

## 2022-04-22 DIAGNOSIS — I10 Essential (primary) hypertension: Secondary | ICD-10-CM

## 2022-04-22 DIAGNOSIS — Z87898 Personal history of other specified conditions: Secondary | ICD-10-CM | POA: Diagnosis not present

## 2022-04-22 DIAGNOSIS — E785 Hyperlipidemia, unspecified: Secondary | ICD-10-CM

## 2022-04-22 DIAGNOSIS — R0609 Other forms of dyspnea: Secondary | ICD-10-CM | POA: Diagnosis not present

## 2022-04-22 NOTE — Patient Instructions (Signed)
Medication Instructions:  Your physician recommends that you continue on your current medications as directed. Please refer to the Current Medication list given to you today.   Labwork: None today  Testing/Procedures: Your physician has requested that you have an echocardiogram in the [facility] office. Echocardiography is a painless test that uses sound waves to create images of your heart. It provides your doctor with information about the size and shape of your heart and how well your heart's chambers and valves are working. This procedure takes approximately one hour. There are no restrictions for this procedure. Please do NOT wear cologne, perfume, aftershave, or lotions (deodorant is allowed). Please arrive 15 minutes prior to your appointment time.   Follow-Up: As needed  Any Other Special Instructions Will Be Listed Below (If Applicable).  If you need a refill on your cardiac medications before your next appointment, please call your pharmacy.

## 2022-04-22 NOTE — Progress Notes (Signed)
Cardiology Office Note    Date:  04/22/2022  ID:  [name], DOB February 26, 1950, MRN [id number] Cardiologist: Armanda Magic, MD    History of Present Illness:    [name] is a 72 y.o. female with past medical history of chest pain (prior abnormal NST with Coronary CT in 11/2019 showing calcium score of 0), HTN, HLD, OSA, anxiety and depression who presents to the office today for evaluation of dyspnea.  She was last examined by Carlean Jews, PA-C in 05/2021 for preoperative cardiac clearance prior to knee surgery. She reported having diffuse body aches in the setting of fibromyalgia but denied specific anginal symptoms. She did not require further cardiac testing and was cleared to proceed with surgery.  She presented to [facility] ED on 03/31/2022 for evaluation of pain underneath her left rib cage that radiated into her chest. Troponin values were normal and LFT's were normal as well. Lipase was elevated at 80. RUQ US showed cholelithiasis without evidence of acute cholecystitis. She was discharged home and informed to follow-up with General Surgery.  In talking with the patient today, she reports having dyspnea on exertion ever since her COVID-19 diagnosis in 11/2021. She tries to walk for a mile a day for exercise and has noticed this with activity. Denies any associated chest pain or palpitations. No specific orthopnea, PND or pitting edema. Reports occasional RUQ pain but nothing as severe as the episode that brought her to the ED. She is trying to make dietary changes as she wishes to avoid surgery.   Studies Reviewed:   EKG: EKG is not ordered today. EKG from 03/31/2022 is reviewed and shows NSR, HR 62 with 1st degree AV Block. Slight ST depression along inferior leads    Echocardiogram: 10/2019 Summary   1. The left ventricle is normal in size with mildly increased wall  thickness.   2. The left ventricular systolic function is normal, LVEF is visually  estimated at  65-70%.    3. There is grade I diastolic dysfunction (impaired relaxation).    4. The left atrium is mildly dilated in size.    5. The right ventricle is normal in size, with normal systolic function.    6. No significant valvular dysfunction.   Coronary CT: 11/2019 Coronary Arteries:  Normal coronary origin.  Right dominance.   RCA is a large dominant artery that gives rise to PDA and PLVB. There is no plaque.   Left main is a large artery that gives rise to LAD and LCX arteries. There is no plaque.   LAD is a large vessel that gives rise to a moderate sized D1. There is no plaque.   LCX is a non-dominant artery that gives rise to one large OM1 branch. There is no plaque.   Other findings:   Normal pulmonary vein drainage into the left atrium.   Normal let atrial appendage without a thrombus.   Normal size of the pulmonary artery.   Possible small PFO.   IMPRESSION: 1. Coronary calcium score of 0. This was 0 percentile for age and sex matched control.   2.  Normal coronary origin with right dominance.   3.  No evidence of CAD.  CAD RADS 0.   4.  Consider no atherosclerotic causes of chest pain.    Physical Exam:   VS:  BP 118/66   Pulse 72   Ht 5' 5.5" (1.664 m)   Wt 203 lb (92.1 kg)   LMP  (LMP Unknown)  SpO2 98%   BMI 33.27 kg/m    Wt Readings from Last 3 Encounters:  04/22/22 203 lb (92.1 kg)  03/31/22 194 lb (88 kg)  07/02/21 188 lb (85.3 kg)     GEN: Well nourished, well developed female appearing in no acute distress NECK: No JVD; No carotid bruits CARDIAC: RRR, no murmurs, rubs, gallops RESPIRATORY:  Clear to auscultation without rales, wheezing or rhonchi  ABDOMEN: Appears non-distended. No obvious abdominal masses. EXTREMITIES: No clubbing or cyanosis. No pitting edema.  Distal pedal pulses are 2+ bilaterally.   Assessment and Plan:   1. Dyspnea on Exertion - This has been occurring since her prior COVID-19 diagnosis and could be due to  long-haul symptoms. We reviewed that her recent Coronary CT in 2021 showed a coronary calcium score of 0 and I doubt that she has developed significant plaque within this timeframe. Labs were checked by her PCP in the interim and reassuring. Will plan to obtain an echocardiogram to assess for any structural abnormalities. If this is reassuring, would not anticipate further cardiac workup.  2. History of Chest Pain - She previously had a false-positive NST with Coronary CT in 11/2019 showing a calcium score of 0. This was reviewed with the patient today. Continue with risk factor modification.  3. HTN - BP is well-controlled at 118/66 during today's visit. Continue current medical therapy with Amlodipine 5 mg daily, Coreg 25 mg twice daily, Chlorthalidone 25 mg daily and Olmesartan 40 mg daily.  4. HLD - LDL was at 68 in 09/2021. Followed by her PCP. Remains on Simvastatin 20mg  daily.   Signed, Ellsworth Lennox, PA-C

## 2022-04-23 DIAGNOSIS — R059 Cough, unspecified: Secondary | ICD-10-CM | POA: Diagnosis not present

## 2022-04-23 DIAGNOSIS — J069 Acute upper respiratory infection, unspecified: Secondary | ICD-10-CM | POA: Diagnosis not present

## 2022-04-24 DIAGNOSIS — J069 Acute upper respiratory infection, unspecified: Secondary | ICD-10-CM | POA: Diagnosis not present

## 2022-04-25 DIAGNOSIS — J209 Acute bronchitis, unspecified: Secondary | ICD-10-CM | POA: Diagnosis not present

## 2022-04-25 DIAGNOSIS — R059 Cough, unspecified: Secondary | ICD-10-CM | POA: Diagnosis not present

## 2022-05-09 DIAGNOSIS — R300 Dysuria: Secondary | ICD-10-CM | POA: Diagnosis not present

## 2022-05-19 ENCOUNTER — Other Ambulatory Visit: Payer: Medicare Other

## 2022-05-20 ENCOUNTER — Ambulatory Visit: Payer: Medicare Other | Attending: Student

## 2022-05-20 DIAGNOSIS — R0609 Other forms of dyspnea: Secondary | ICD-10-CM | POA: Diagnosis not present

## 2022-05-21 LAB — ECHOCARDIOGRAM COMPLETE
AR max vel: 1.78 cm2
AV Area VTI: 1.97 cm2
AV Area mean vel: 1.8 cm2
AV Mean grad: 7 mmHg
AV Peak grad: 11.8 mmHg
Ao pk vel: 1.72 m/s
Area-P 1/2: 4.1 cm2
Calc EF: 65.9 %
MV M vel: 3.41 m/s
MV Peak grad: 46.5 mmHg
S' Lateral: 3.1 cm
Single Plane A2C EF: 68.6 %
Single Plane A4C EF: 62.5 %

## 2022-05-23 ENCOUNTER — Telehealth: Payer: Self-pay | Admitting: Cardiology

## 2022-05-23 NOTE — Telephone Encounter (Signed)
Patient is returning call in regards to results and is requesting return call.

## 2022-05-23 NOTE — Telephone Encounter (Signed)
Patient notified and verbalized understanding of testing results. Patient had no questions or concerns at this time. PCP copied.

## 2022-05-23 NOTE — Telephone Encounter (Signed)
Ellsworth Lennox, PA-C 05/21/2022  1:35 PM EDT     Please let the patient know that her echocardiogram shows normal pumping function of the heart with a preserved ejection fraction of 70 to 75%. No wall motion abnormalities. Right ventricular function is normal as well. She does not have any significant valve abnormalities. Overall, a reassuring study.

## 2022-05-27 DIAGNOSIS — H6990 Unspecified Eustachian tube disorder, unspecified ear: Secondary | ICD-10-CM | POA: Diagnosis not present

## 2022-05-27 DIAGNOSIS — R030 Elevated blood-pressure reading, without diagnosis of hypertension: Secondary | ICD-10-CM | POA: Diagnosis not present

## 2022-05-27 DIAGNOSIS — B3731 Acute candidiasis of vulva and vagina: Secondary | ICD-10-CM | POA: Diagnosis not present

## 2022-05-27 DIAGNOSIS — K5900 Constipation, unspecified: Secondary | ICD-10-CM | POA: Diagnosis not present

## 2022-05-30 DIAGNOSIS — I10 Essential (primary) hypertension: Secondary | ICD-10-CM | POA: Diagnosis not present

## 2022-05-30 DIAGNOSIS — G4733 Obstructive sleep apnea (adult) (pediatric): Secondary | ICD-10-CM | POA: Diagnosis not present

## 2022-06-10 DIAGNOSIS — R300 Dysuria: Secondary | ICD-10-CM | POA: Diagnosis not present

## 2022-06-26 ENCOUNTER — Other Ambulatory Visit: Payer: Self-pay | Admitting: Physician Assistant

## 2022-07-03 NOTE — Progress Notes (Signed)
History of Present Illness: [name] is a 72 y.o. female who presents today as a new patient / to re-establish care at [facility]. - GU history: 1. Interstitial cystitis. - 04/18/2019: Underwent cystoscopy with hydrodistention by Dr. Ronne Binning. Bladder capacity = 500 ml; no Hunner's ulcers.  2. Prior recurrent UTIs.  3. Right renal angiomyolipoma (3-4 mm). Per CT on 05/11/2019. Benign; no follow up needed.  At last visit with Dr. Ronne Binning on 05/18/2019: Pelvic floor physical therapy advised to IC.  Urine culture results in past 12 months: - 05/09/2022: Positive for E. coli - 06/10/2022: Negative  Today: She reports that her bladder pain is described as mild and intermittent on average. She reports intermittent urinary urgency and frequency. Denies dysuria, gross hematuria, straining to void, or sensations of incomplete emptying.  She reports mixed urinary incontinence; states the stress urinary incontinence is predominant. She wears pads. She states the urine leakage is significant bothersome.   She reports some kind of previously "bladder tack" - unclear what was done per her report, sounds like it was for both stress urinary incontinence and pelvic organ prolapse; operative reports unavailable in chart for review. States she has tried at [employer] without success. Didn't go to pelvic floor physical therapy. She denies feeling or seeing vaginal bulge at this time and denies needing to push inside vagina to urinate.    Fall Screening: Do you usually have a device to assist in your mobility? No    Medications: Current Outpatient Medications  Medication Sig Dispense Refill   acetaminophen (TYLENOL) 500 MG tablet Take 500 mg by mouth every 6 (six) hours as needed for moderate pain.     ALPRAZolam (XANAX) 0.5 MG tablet Take 0.25-0.5 mg by mouth 2 (two) times daily. Pt sometimes take 1 tablet  5   amLODipine (NORVASC) 5 MG tablet Take 5 mg by mouth daily.      CAMPHOR-MENTHOL EX Apply 1 application topically 3 (three) times daily as needed (for pain).      carvedilol (COREG) 25 MG tablet TAKE ONE TABLET BY MOUTH TWICE DAILY 180 tablet 3   chlorthalidone (HYGROTON) 25 MG tablet TAKE ONE TABLET BY MOUTH EVERY MORNING 90 tablet 3   DULoxetine (CYMBALTA) 60 MG capsule Take 1 capsule (60 mg total) by mouth 2 (two) times daily. 60 capsule 2   fluticasone (FLONASE) 50 MCG/ACT nasal spray Place 1 spray into both nostrils daily as needed for allergies.      montelukast (SINGULAIR) 10 MG tablet Take 10 mg by mouth daily.     Naphazoline-Pheniramine (OPCON-A OP) Place 1 drop into both eyes 3 (three) times daily as needed (dry/irritated eyes.).     NON FORMULARY Pt uses a cpap nightly     olmesartan (BENICAR) 40 MG tablet TAKE ONE TABLET BY MOUTH ONCE DAILY 90 tablet 3   Probiotic Product (PROBIOTIC DAILY PO) Take 1 capsule by mouth daily.      sennosides-docusate sodium (SENOKOT-S) 8.6-50 MG tablet Take 2 tablets by mouth daily. 30 tablet 1   simvastatin (ZOCOR) 20 MG tablet Take 20 mg by mouth at bedtime.     VITAMIN D PO Take 1 tablet by mouth daily.     No current facility-administered medications for this visit.    Allergies: No Known Allergies  Past Medical History:  Diagnosis Date   Anxiety    Arthritis    hands, feet   Back pain    chronic   Blurred vision  hx of   Cataracts, bilateral    MD just watching right now   Complication of anesthesia    pt states at preop after back surgery she had to sit up all nite ? related to sleep apnea pt not  sure   Cystitides, interstitial, chronic    Depression    Fibromyalgia    GERD (gastroesophageal reflux disease)    Gout    feet   High cholesterol    Hypertension    Interstitial cystitis    hx of   Neuropathy    bilateral feet   Pneumonia    Shortness of breath    with exertion   Sleep apnea    Uses appliance; used CPAP    SVD (spontaneous vaginal delivery)    x 3   Urinary disorder     bladder is in sling per pt   Urinary tract infection    Vertigo    Vertigo    hx of   Wears glasses    Yeast infection    hx of multiple yeast infections per pt   Past Surgical History:  Procedure Laterality Date   ABDOMINAL HYSTERECTOMY  1997   [place] [facility]   ANTERIOR LAT LUMBAR FUSION Right 04/28/2018   Procedure: ANTERIOR LATERAL INNER BODY LUMBAR FUSION L3-4, L4-5 2 LEVELS WITH INSTRUMENTATION AND ALLOGRAFT;  Surgeon: Estill Bamberg, MD;  Location: [facility] OR;  Service: Orthopedics;  Laterality: Right;   BACK SURGERY  10/20/2017   bladder tack     COLONOSCOPY N/A 08/23/2014   Procedure: COLONOSCOPY;  Surgeon: Malissa Hippo, MD;  Location: [facility];  Service: Endoscopy;  Laterality: N/A;  1200   CYSTO WITH HYDRODISTENSION N/A 04/18/2019   Procedure: CYSTOSCOPY/HYDRODISTENSION;  Surgeon: Malen Gauze, MD;  Location: [facility] ORS;  Service: Urology;  Laterality: N/A;   KNEE SURGERY Left 05/26/14   meniscus tear   LAPAROSCOPIC SALPINGO OOPHERECTOMY  02/17/2012   Procedure: LAPAROSCOPIC SALPINGO OOPHORECTOMY;  Surgeon: Tilda Burrow, MD;  Location: [facility] ORS;  Service: Gynecology;  Laterality: Bilateral;   LASIK Bilateral    TOE SURGERY     TOTAL KNEE ARTHROPLASTY Left 07/09/2021   Procedure: TOTAL KNEE ARTHROPLASTY;  Surgeon: Teryl Lucy, MD;  Location: [facility];  Service: Orthopedics;  Laterality: Left;   UPPER GI ENDOSCOPY     Family History  Problem Relation Age of Onset   Atrial fibrillation Mother    COPD Mother    Hypertension Mother    Hypertension Father    Cancer Father        throat    Drug abuse Brother    Alcohol abuse Brother    Early death Brother    Fibromyalgia Daughter    Paranoid behavior Son    Alcohol abuse Son    Hypertension Brother    Sleep apnea Brother    Alcohol abuse Brother    Drug abuse Brother    Social History   Socioeconomic History   Marital status: Married    Spouse name: Fayrene Fearing   Number of children: Not on file   Years  of education: college   Highest education level: Not on file  Occupational History    Employer: UNEMPLOYED  Tobacco Use   Smoking status: Never   Smokeless tobacco: Never  Vaping Use   Vaping Use: Never used  Substance and Sexual Activity   Alcohol use: Not Currently    Comment: rare   Drug use: No   Sexual activity: Not  Currently    Birth control/protection: Surgical, Post-menopausal    Comment: hysterectomy  Other Topics Concern   Not on file  Social History Narrative   Patient lives at home with her family.   Caffeine Use: 1 cup daily   Social Determinants of Health   Financial Resource Strain: Not on file  Food Insecurity: Not on file  Transportation Needs: Not on file  Physical Activity: Not on file  Stress: Not on file  Social Connections: Not on file  Intimate Partner Violence: Not on file    Review of Systems Constitutional: Patient denies any unintentional weight loss or change in strength lntegumentary: Patient denies any rashes or pruritus Eyes: Patient denies dry eyes ENT: Patient denies dry mouth Cardiovascular: Patient denies chest pain or syncope Respiratory: Patient denies shortness of breath Gastrointestinal: Patient denies nausea, vomiting, constipation, or diarrhea Musculoskeletal: Patient denies muscle cramps or weakness Neurologic: Patient denies convulsions or seizures Psychiatric: Patient denies memory problems Allergic/Immunologic: Patient denies recent allergic reaction(s) Hematologic/Lymphatic: Patient denies bleeding tendencies Endocrine: Patient denies heat/cold intolerance  GU: As per HPI.  OBJECTIVE Vitals:   07/08/22 0959  BP: 125/72  Pulse: 61  Temp: 98.1 F (36.7 C)   There is no height or weight on file to calculate BMI.  Physical Examination  Constitutional: No obvious distress; patient is non-toxic appearing  Cardiovascular: No visible lower extremity edema.  Respiratory: The patient does not have audible  wheezing/stridor; respirations do not appear labored  Gastrointestinal: Abdomen non-distended Musculoskeletal: Normal ROM of UEs  Skin: No obvious rashes/open sores  Neurologic: CN 2-12 grossly intact Psychiatric: Answered questions appropriately with normal affect Hematologic/Lymphatic/Immunologic: No obvious bruises or sites of spontaneous bleeding  UA: positive for 6-10 WBC/hpf and bacteria (few) PVR: 32 ml  ASSESSMENT IC (interstitial cystitis) - Plan: Urinalysis, Routine w reflex microscopic, BLADDER SCAN AMB NON-IMAGING  Stress incontinence of urine - Plan: Ambulatory referral to Urology  Urge incontinence  1. Interstitial cystitis. Well managed at this time.  2. Prior recurrent UTIs. Discussed recent UTI; asymptomatic for UTI today.   3. Bothersome stress urinary incontinence:  The etiology of this condition was explained in detail to include pelvic floor muscle relaxation and detachment of the urethra away from its connection to the pubic bone. Her risk profile was reviewed, including childbirth.   The management options were reviewed to include: No intervention, observation. Non-surgical options: Pelvic floor muscle rehabilitation Suppression strategies Weight loss Incontinence pessary Surgical consultation: Options may include a midurethral sling (with synthetic mesh implant or autologous fascial sling), a Burch urethropexy, or transurethral injection of a bulking agent. Detailed discussion was had regarding the potential risks of both procedures, including: mesh exposure, mesh erosion, failure to resolve SUI, ineffectiveness of these procedures for urge UI, dyspareunia, voiding dysfunction and/or retention.  She elected to proceed with referral to [facility] for surgical consultation.  4. Urge urinary incontinence: Not significantly bothersome per patient.   Pt verbalized understanding and agreement. All questions were answered.  PLAN Advised the  following: 1. Return for Referral to [facility] for stress incontinence surgical consultation.  Orders Placed This Encounter  Procedures   Urinalysis, Routine w reflex microscopic   Ambulatory referral to Urology    Referral Priority:   Routine    Referral Type:   Consultation    Referral Reason:   Specialty Services Required    Requested Specialty:   Urology    Number of Visits Requested:   1   BLADDER SCAN AMB NON-IMAGING  It has been explained that the patient is to follow regularly with their PCP in addition to all other providers involved in their care and to follow instructions provided by these respective offices. Patient advised to contact urology clinic if any urologic-pertaining questions, concerns, new symptoms or problems arise in the interim period.  There are no Patient Instructions on file for this visit.  Electronically signed by:  Donnita Falls, FNP   07/08/22    11:17 AM

## 2022-07-08 ENCOUNTER — Ambulatory Visit: Payer: Medicare Other | Admitting: Urology

## 2022-07-08 ENCOUNTER — Encounter: Payer: Self-pay | Admitting: Urology

## 2022-07-08 VITALS — BP 125/72 | HR 61 | Temp 98.1°F

## 2022-07-08 DIAGNOSIS — R3989 Other symptoms and signs involving the genitourinary system: Secondary | ICD-10-CM

## 2022-07-08 DIAGNOSIS — N3946 Mixed incontinence: Secondary | ICD-10-CM

## 2022-07-08 DIAGNOSIS — N952 Postmenopausal atrophic vaginitis: Secondary | ICD-10-CM

## 2022-07-08 DIAGNOSIS — Z87440 Personal history of urinary (tract) infections: Secondary | ICD-10-CM | POA: Diagnosis not present

## 2022-07-08 DIAGNOSIS — N3010 Interstitial cystitis (chronic) without hematuria: Secondary | ICD-10-CM | POA: Diagnosis not present

## 2022-07-08 DIAGNOSIS — N3941 Urge incontinence: Secondary | ICD-10-CM | POA: Insufficient documentation

## 2022-07-08 DIAGNOSIS — N393 Stress incontinence (female) (male): Secondary | ICD-10-CM

## 2022-07-08 DIAGNOSIS — R32 Unspecified urinary incontinence: Secondary | ICD-10-CM

## 2022-07-08 LAB — URINALYSIS, ROUTINE W REFLEX MICROSCOPIC
Bilirubin, UA: NEGATIVE
Glucose, UA: NEGATIVE
Ketones, UA: NEGATIVE
Nitrite, UA: NEGATIVE
RBC, UA: NEGATIVE
Specific Gravity, UA: 1.01 (ref 1.005–1.030)
Urobilinogen, Ur: 0.2 mg/dL (ref 0.2–1.0)
pH, UA: 6.5 (ref 5.0–7.5)

## 2022-07-08 LAB — MICROSCOPIC EXAMINATION

## 2022-07-08 LAB — BLADDER SCAN AMB NON-IMAGING

## 2022-07-09 ENCOUNTER — Telehealth: Payer: Self-pay

## 2022-07-09 MED ORDER — ESTRADIOL 0.1 MG/GM VA CREA: TOPICAL_CREAM | VAGINAL | 3 refills | Status: AC

## 2022-07-09 NOTE — Patient Instructions (Addendum)
Recommendations regarding UTI prevention / management:  When UTI symptoms occur: Call urology office to request order for urine culture. We recommend waiting for urine culture result prior to use of any antibiotics.  For patients with interstitial cystitis: Symptoms of true bacterial UTI can overlap / mimic symptoms of an IC flare up. Antibiotic use is NOT indicated for IC flare ups. Therefore we always recommend obtaining a urine culture prior to antibiotic treatment for patients with IC. The goal to minimize your risk for developing antibiotic resistant bacteria.  For bladder pain/ burning with urination: Over the counter Pyridium (phenazopyridine) as needed (commonly known under the "AZO" brand). No more than 3 days consecutively at a time due to risk for methemoglobinemia, liver function issues, and bone health damage with long term use of Pyridium.  Routine use for UTI prevention: - Topical vaginal estrogen for vaginal atrophy. Adequate fluid intake (>1.5 liters/day) to flush out the urinary tract. - Go to the bathroom to urinate every 4-6 hours while awake to minimize urinary stasis / bacterial overgrowth in the bladder. - D-mannose powder (2 grams daily). This is an over-the-counter supplement which decreases bacterial adherence to bladder lining (it is a sugar that inhibits bacterial adherence to urothelial cells by binding to the pili of enteric bacteria). Take as per manufacturer recommendation. Can be used as an alternative or in addition to the concentrated cranberry supplement. Not recommended for diabetic patients due to sugar content. - Probiotic to maintain healthy vaginal microbiome to suppress bacteria at urethral opening. Brand recommendations: Darrold Junker (includes probiotic & D-mannose ), Feminine Balance (highest concentration of lactobacillus) or Hyperbiotic Pro 15.  Note for patients with interstitial cystitis: Patients with IC should typically avoid cranberry/ PAC supplements and  Vitamin C supplements due to their acidity, which may exacerbate IC-related bladder pain.  Note for patients with diabetes: You may read about D-mannose powder for UTI prevention. That is an over the-counter supplement which decreases bacterial adherence to bladder lining. I would NOT advise that for you as a person with diabetes due to its sugar content.

## 2022-07-09 NOTE — Telephone Encounter (Signed)
Patient left a voicemail asking about vaginal cream she and Sarah NP discussed at office visit. Patient thought NP discussed sending in prescription.  Message sent to NP to advise.

## 2022-07-09 NOTE — Addendum Note (Signed)
Addended byEvette Georges on: 07/09/2022 11:06 AM   Modules accepted: Orders, Level of Service

## 2022-08-01 DIAGNOSIS — M545 Low back pain, unspecified: Secondary | ICD-10-CM | POA: Diagnosis not present

## 2022-08-01 DIAGNOSIS — M542 Cervicalgia: Secondary | ICD-10-CM | POA: Diagnosis not present

## 2022-08-18 DIAGNOSIS — M9902 Segmental and somatic dysfunction of thoracic region: Secondary | ICD-10-CM | POA: Diagnosis not present

## 2022-08-18 DIAGNOSIS — M47812 Spondylosis without myelopathy or radiculopathy, cervical region: Secondary | ICD-10-CM | POA: Diagnosis not present

## 2022-08-18 DIAGNOSIS — M47816 Spondylosis without myelopathy or radiculopathy, lumbar region: Secondary | ICD-10-CM | POA: Diagnosis not present

## 2022-08-18 DIAGNOSIS — M9901 Segmental and somatic dysfunction of cervical region: Secondary | ICD-10-CM | POA: Diagnosis not present

## 2022-08-18 DIAGNOSIS — M9903 Segmental and somatic dysfunction of lumbar region: Secondary | ICD-10-CM | POA: Diagnosis not present

## 2022-08-18 DIAGNOSIS — M546 Pain in thoracic spine: Secondary | ICD-10-CM | POA: Diagnosis not present

## 2022-08-19 DIAGNOSIS — R030 Elevated blood-pressure reading, without diagnosis of hypertension: Secondary | ICD-10-CM | POA: Diagnosis not present

## 2022-08-19 DIAGNOSIS — M797 Fibromyalgia: Secondary | ICD-10-CM | POA: Diagnosis not present

## 2022-08-19 DIAGNOSIS — G4733 Obstructive sleep apnea (adult) (pediatric): Secondary | ICD-10-CM | POA: Diagnosis not present

## 2022-08-21 DIAGNOSIS — M546 Pain in thoracic spine: Secondary | ICD-10-CM | POA: Diagnosis not present

## 2022-08-21 DIAGNOSIS — M47812 Spondylosis without myelopathy or radiculopathy, cervical region: Secondary | ICD-10-CM | POA: Diagnosis not present

## 2022-08-21 DIAGNOSIS — M9901 Segmental and somatic dysfunction of cervical region: Secondary | ICD-10-CM | POA: Diagnosis not present

## 2022-08-21 DIAGNOSIS — M9902 Segmental and somatic dysfunction of thoracic region: Secondary | ICD-10-CM | POA: Diagnosis not present

## 2022-08-21 DIAGNOSIS — M9903 Segmental and somatic dysfunction of lumbar region: Secondary | ICD-10-CM | POA: Diagnosis not present

## 2022-08-26 DIAGNOSIS — M47812 Spondylosis without myelopathy or radiculopathy, cervical region: Secondary | ICD-10-CM | POA: Diagnosis not present

## 2022-08-26 DIAGNOSIS — M546 Pain in thoracic spine: Secondary | ICD-10-CM | POA: Diagnosis not present

## 2022-08-26 DIAGNOSIS — M47816 Spondylosis without myelopathy or radiculopathy, lumbar region: Secondary | ICD-10-CM | POA: Diagnosis not present

## 2022-08-26 DIAGNOSIS — M9903 Segmental and somatic dysfunction of lumbar region: Secondary | ICD-10-CM | POA: Diagnosis not present

## 2022-08-26 DIAGNOSIS — M9902 Segmental and somatic dysfunction of thoracic region: Secondary | ICD-10-CM | POA: Diagnosis not present

## 2022-08-26 DIAGNOSIS — M9901 Segmental and somatic dysfunction of cervical region: Secondary | ICD-10-CM | POA: Diagnosis not present

## 2022-09-02 DIAGNOSIS — M9903 Segmental and somatic dysfunction of lumbar region: Secondary | ICD-10-CM | POA: Diagnosis not present

## 2022-09-02 DIAGNOSIS — M9901 Segmental and somatic dysfunction of cervical region: Secondary | ICD-10-CM | POA: Diagnosis not present

## 2022-09-02 DIAGNOSIS — M47812 Spondylosis without myelopathy or radiculopathy, cervical region: Secondary | ICD-10-CM | POA: Diagnosis not present

## 2022-09-02 DIAGNOSIS — M546 Pain in thoracic spine: Secondary | ICD-10-CM | POA: Diagnosis not present

## 2022-09-02 DIAGNOSIS — M9902 Segmental and somatic dysfunction of thoracic region: Secondary | ICD-10-CM | POA: Diagnosis not present

## 2022-09-02 DIAGNOSIS — M47816 Spondylosis without myelopathy or radiculopathy, lumbar region: Secondary | ICD-10-CM | POA: Diagnosis not present

## 2022-10-03 DIAGNOSIS — J309 Allergic rhinitis, unspecified: Secondary | ICD-10-CM | POA: Diagnosis not present

## 2022-10-03 DIAGNOSIS — M26609 Unspecified temporomandibular joint disorder, unspecified side: Secondary | ICD-10-CM | POA: Diagnosis not present

## 2022-10-03 DIAGNOSIS — H903 Sensorineural hearing loss, bilateral: Secondary | ICD-10-CM | POA: Diagnosis not present

## 2022-10-03 DIAGNOSIS — J343 Hypertrophy of nasal turbinates: Secondary | ICD-10-CM | POA: Diagnosis not present

## 2022-10-03 DIAGNOSIS — H6993 Unspecified Eustachian tube disorder, bilateral: Secondary | ICD-10-CM | POA: Diagnosis not present

## 2022-10-03 DIAGNOSIS — K1120 Sialoadenitis, unspecified: Secondary | ICD-10-CM | POA: Diagnosis not present

## 2022-10-03 DIAGNOSIS — R42 Dizziness and giddiness: Secondary | ICD-10-CM | POA: Diagnosis not present

## 2022-10-06 DIAGNOSIS — M533 Sacrococcygeal disorders, not elsewhere classified: Secondary | ICD-10-CM | POA: Diagnosis not present

## 2022-10-07 DIAGNOSIS — E7849 Other hyperlipidemia: Secondary | ICD-10-CM | POA: Diagnosis not present

## 2022-10-07 DIAGNOSIS — N1831 Chronic kidney disease, stage 3a: Secondary | ICD-10-CM | POA: Diagnosis not present

## 2022-10-07 DIAGNOSIS — I10 Essential (primary) hypertension: Secondary | ICD-10-CM | POA: Diagnosis not present

## 2022-10-07 DIAGNOSIS — E559 Vitamin D deficiency, unspecified: Secondary | ICD-10-CM | POA: Diagnosis not present

## 2022-10-07 DIAGNOSIS — R739 Hyperglycemia, unspecified: Secondary | ICD-10-CM | POA: Diagnosis not present

## 2022-10-14 DIAGNOSIS — E7849 Other hyperlipidemia: Secondary | ICD-10-CM | POA: Diagnosis not present

## 2022-10-14 DIAGNOSIS — Z0001 Encounter for general adult medical examination with abnormal findings: Secondary | ICD-10-CM | POA: Diagnosis not present

## 2022-10-14 DIAGNOSIS — M797 Fibromyalgia: Secondary | ICD-10-CM | POA: Diagnosis not present

## 2022-10-14 DIAGNOSIS — G629 Polyneuropathy, unspecified: Secondary | ICD-10-CM | POA: Diagnosis not present

## 2022-10-14 DIAGNOSIS — N1831 Chronic kidney disease, stage 3a: Secondary | ICD-10-CM | POA: Diagnosis not present

## 2022-10-14 DIAGNOSIS — R030 Elevated blood-pressure reading, without diagnosis of hypertension: Secondary | ICD-10-CM | POA: Diagnosis not present

## 2022-10-14 DIAGNOSIS — G4733 Obstructive sleep apnea (adult) (pediatric): Secondary | ICD-10-CM | POA: Diagnosis not present

## 2022-10-22 DIAGNOSIS — M533 Sacrococcygeal disorders, not elsewhere classified: Secondary | ICD-10-CM | POA: Diagnosis not present

## 2022-11-03 DIAGNOSIS — M4696 Unspecified inflammatory spondylopathy, lumbar region: Secondary | ICD-10-CM | POA: Diagnosis not present

## 2022-11-03 DIAGNOSIS — M533 Sacrococcygeal disorders, not elsewhere classified: Secondary | ICD-10-CM | POA: Diagnosis not present

## 2022-11-24 DIAGNOSIS — M47816 Spondylosis without myelopathy or radiculopathy, lumbar region: Secondary | ICD-10-CM | POA: Diagnosis not present

## 2022-12-02 DIAGNOSIS — M47816 Spondylosis without myelopathy or radiculopathy, lumbar region: Secondary | ICD-10-CM | POA: Diagnosis not present

## 2022-12-18 DIAGNOSIS — N390 Urinary tract infection, site not specified: Secondary | ICD-10-CM | POA: Diagnosis not present

## 2022-12-29 DIAGNOSIS — M545 Low back pain, unspecified: Secondary | ICD-10-CM | POA: Diagnosis not present

## 2022-12-30 ENCOUNTER — Ambulatory Visit (HOSPITAL_COMMUNITY)
Admission: RE | Admit: 2022-12-30 | Discharge: 2022-12-30 | Disposition: A | Discharge status: Home / Self Care | Payer: Medicare Other | Source: Ambulatory Visit | Attending: Orthopedic Surgery | Admitting: Orthopedic Surgery

## 2022-12-30 ENCOUNTER — Other Ambulatory Visit (HOSPITAL_COMMUNITY): Payer: Self-pay | Admitting: Orthopedic Surgery

## 2022-12-30 DIAGNOSIS — M5116 Intervertebral disc disorders with radiculopathy, lumbar region: Secondary | ICD-10-CM | POA: Diagnosis not present

## 2022-12-30 DIAGNOSIS — M5416 Radiculopathy, lumbar region: Secondary | ICD-10-CM | POA: Diagnosis not present

## 2022-12-30 DIAGNOSIS — M5117 Intervertebral disc disorders with radiculopathy, lumbosacral region: Secondary | ICD-10-CM | POA: Diagnosis not present

## 2022-12-30 DIAGNOSIS — K8020 Calculus of gallbladder without cholecystitis without obstruction: Secondary | ICD-10-CM | POA: Diagnosis not present

## 2022-12-30 DIAGNOSIS — M48061 Spinal stenosis, lumbar region without neurogenic claudication: Secondary | ICD-10-CM | POA: Diagnosis not present

## 2023-01-03 DIAGNOSIS — M5416 Radiculopathy, lumbar region: Secondary | ICD-10-CM | POA: Diagnosis not present

## 2023-01-05 ENCOUNTER — Other Ambulatory Visit: Payer: Self-pay | Admitting: Orthopedic Surgery

## 2023-01-05 DIAGNOSIS — M5416 Radiculopathy, lumbar region: Secondary | ICD-10-CM

## 2023-01-08 ENCOUNTER — Ambulatory Visit
Admission: RE | Admit: 2023-01-08 | Discharge: 2023-01-08 | Disposition: A | Discharge status: Home / Self Care | Payer: Medicare Other | Source: Ambulatory Visit | Attending: Orthopedic Surgery | Admitting: Orthopedic Surgery

## 2023-01-08 DIAGNOSIS — M5416 Radiculopathy, lumbar region: Secondary | ICD-10-CM

## 2023-01-08 DIAGNOSIS — M5116 Intervertebral disc disorders with radiculopathy, lumbar region: Secondary | ICD-10-CM | POA: Diagnosis not present

## 2023-01-08 MED ORDER — IOPAMIDOL (ISOVUE-M 200) INJECTION 41%
1.0000 mL | Freq: Once | INTRAMUSCULAR | Status: AC
  Administered 2023-01-08: 1 mL via EPIDURAL

## 2023-01-08 MED ORDER — METHYLPREDNISOLONE ACETATE 40 MG/ML INJ SUSP (RADIOLOG
80.0000 mg | Freq: Once | INTRAMUSCULAR | Status: AC
  Administered 2023-01-08: 80 mg via EPIDURAL

## 2023-01-08 NOTE — Discharge Instructions (Signed)

## 2023-01-15 DIAGNOSIS — N183 Chronic kidney disease, stage 3 unspecified: Secondary | ICD-10-CM | POA: Diagnosis not present

## 2023-01-15 DIAGNOSIS — I10 Essential (primary) hypertension: Secondary | ICD-10-CM | POA: Diagnosis not present

## 2023-01-15 DIAGNOSIS — R739 Hyperglycemia, unspecified: Secondary | ICD-10-CM | POA: Diagnosis not present

## 2023-01-19 ENCOUNTER — Other Ambulatory Visit: Payer: Self-pay | Admitting: Orthopedic Surgery

## 2023-01-19 DIAGNOSIS — M545 Low back pain, unspecified: Secondary | ICD-10-CM

## 2023-01-19 DIAGNOSIS — M5416 Radiculopathy, lumbar region: Secondary | ICD-10-CM | POA: Diagnosis not present

## 2023-01-20 DIAGNOSIS — I10 Essential (primary) hypertension: Secondary | ICD-10-CM | POA: Diagnosis not present

## 2023-01-20 DIAGNOSIS — G4733 Obstructive sleep apnea (adult) (pediatric): Secondary | ICD-10-CM | POA: Diagnosis not present

## 2023-01-20 DIAGNOSIS — G629 Polyneuropathy, unspecified: Secondary | ICD-10-CM | POA: Diagnosis not present

## 2023-01-20 DIAGNOSIS — M797 Fibromyalgia: Secondary | ICD-10-CM | POA: Diagnosis not present

## 2023-01-20 DIAGNOSIS — N1831 Chronic kidney disease, stage 3a: Secondary | ICD-10-CM | POA: Diagnosis not present

## 2023-01-20 DIAGNOSIS — E7849 Other hyperlipidemia: Secondary | ICD-10-CM | POA: Diagnosis not present

## 2023-01-22 ENCOUNTER — Ambulatory Visit
Admission: RE | Admit: 2023-01-22 | Discharge: 2023-01-22 | Disposition: A | Discharge status: Home / Self Care | Payer: Medicare Other | Source: Ambulatory Visit | Attending: Orthopedic Surgery | Admitting: Orthopedic Surgery

## 2023-01-22 DIAGNOSIS — Z981 Arthrodesis status: Secondary | ICD-10-CM | POA: Diagnosis not present

## 2023-01-22 DIAGNOSIS — M545 Low back pain, unspecified: Secondary | ICD-10-CM

## 2023-01-22 DIAGNOSIS — M48061 Spinal stenosis, lumbar region without neurogenic claudication: Secondary | ICD-10-CM | POA: Diagnosis not present

## 2023-02-03 ENCOUNTER — Other Ambulatory Visit: Payer: Self-pay | Admitting: Orthopedic Surgery

## 2023-02-12 ENCOUNTER — Encounter (HOSPITAL_COMMUNITY): Payer: Self-pay

## 2023-02-12 NOTE — Progress Notes (Addendum)
Surgical Instructions   Your procedure is scheduled on Thursday, 02/19/23.Marland Kitchen Report to [facility] Main Entrance "A" at 9 A.M., then check in with the Admitting office. Any questions or running late day of surgery: call [phone]  Questions prior to your surgery date: call [phone], Monday-Friday, 8am-4pm. If you experience any cold or flu symptoms such as cough, fever, chills, shortness of breath, etc. between now and your scheduled surgery, please notify us at the above number.     Remember:  Do not eat after midnight the night before your surgery-Wed.  You may drink clear liquids until 8:45 AM the morning of your surgery-Thurs.   Clear liquids allowed are: Water, Non-Citrus Juices (without pulp), Carbonated Beverages, Clear Tea (no milk, honey, etc.), Black Coffee Only (NO MILK, CREAM OR POWDERED CREAMER of any kind), and Gatorade.  Please complete your PRE-SURGERY ENSURE that was provided to you by 8:45 AM, the morning of surgery.  Please, if able, drink it in one setting. DO NOT SIP.  This will be the last thing that you drink on the morning of surgery.    Take these medicines the morning of surgery with A SIP OF WATER  ALPRAZolam (XANAX)  amLODipine (NORVASC)  carvedilol (COREG)  DULoxetine (CYMBALTA)  montelukast (SINGULAIR)   IF NEEDED: acetaminophen (TYLENOL)  fluticasone (FLONASE)  OPCON-A OP eye drops   One week prior to surgery, STOP taking any Aspirin (unless otherwise instructed by your surgeon) Aleve, Naproxen, Ibuprofen, Motrin, Advil, Goody's, BC's, all herbal medications, fish oil, and non-prescription vitamins.          If you use a CPAP at night, you may bring your mask/headgear for your overnight stay.   You will be asked to remove any contacts, glasses, piercing's, hearing aid's, dentures/partials prior to surgery. Please bring cases for these items if needed.     SURGICAL WAITING ROOM VISITATION Patients may have no more than 2 support people in the  waiting area - these visitors may rotate.   Pre-op nurse will coordinate an appropriate time for 1 ADULT support person, who may not rotate, to accompany patient in pre-op.  Children under the age of 41 must have an adult with them who is not the patient and must remain in the main waiting area with an adult.  If the patient needs to stay at the hospital during part of their recovery, the visitor guidelines for inpatient rooms apply.  Please refer to the [facility] website for the visitor guidelines for any additional information.   If you received a COVID test during your pre-op visit  it is requested that you wear a mask when out in public, stay away from anyone that may not be feeling well and notify your surgeon if you develop symptoms. If you have been in contact with anyone that has tested positive in the last 10 days please notify you surgeon.      Pre-operative 5 CHG Bathing Instructions   You can play a key role in reducing the risk of infection after surgery. Your skin needs to be as free of germs as possible. You can reduce the number of germs on your skin by washing with CHG (chlorhexidine gluconate) soap before surgery. CHG is an antiseptic soap that kills germs and continues to kill germs even after washing.   DO NOT use if you have an allergy to chlorhexidine/CHG or antibacterial soaps. If your skin becomes reddened or irritated, stop using the CHG and notify one of our RNs at  [phone].   Please shower with the CHG soap starting 4 days before surgery using the following schedule:     Please keep in mind the following:  DO NOT shave, including legs and underarms, starting the day of your first shower.   You may shave your face at any point before/day of surgery.  Place clean sheets on your bed the day you start using CHG soap. Use a clean washcloth (not used since being washed) for each shower. DO NOT sleep with pets once you start using the CHG.   CHG Shower  Instructions:  Wash your face and private area with normal soap. If you choose to wash your hair, wash first with your normal shampoo.  After you use shampoo/soap, rinse your hair and body thoroughly to remove shampoo/soap residue.  Turn the water OFF and apply about 3 tablespoons (45 ml) of CHG soap to a CLEAN washcloth.  Apply CHG soap ONLY FROM YOUR NECK DOWN TO YOUR TOES (washing for 3-5 minutes)  DO NOT use CHG soap on face, private areas, open wounds, or sores.  Pay special attention to the area where your surgery is being performed.  If you are having back surgery, having someone wash your back for you may be helpful. Wait 2 minutes after CHG soap is applied, then you may rinse off the CHG soap.  Pat dry with a clean towel  Put on clean clothes/pajamas   If you choose to wear lotion, please use ONLY the CHG-compatible lotions that are listed below.  Additional instructions for the day of surgery: DO NOT APPLY any lotions, deodorants, cologne, or perfumes.   Do not bring valuables to the hospital. [facility] is not responsible for any belongings/valuables. Do not wear nail polish, gel polish, artificial nails, or any other type of covering on natural nails (fingers and toes) Do not wear jewelry or makeup Put on clean/comfortable clothes.  Please brush your teeth.  Ask your nurse before applying any prescription medications to the skin.     CHG Compatible Lotions   Aveeno Moisturizing lotion  Cetaphil Moisturizing Cream  Cetaphil Moisturizing Lotion  Clairol Herbal Essence Moisturizing Lotion, Dry Skin  Clairol Herbal Essence Moisturizing Lotion, Extra Dry Skin  Clairol Herbal Essence Moisturizing Lotion, Normal Skin  Curel Age Defying Therapeutic Moisturizing Lotion with Alpha Hydroxy  Curel Extreme Care Body Lotion  Curel Soothing Hands Moisturizing Hand Lotion  Curel Therapeutic Moisturizing Cream, Fragrance-Free  Curel Therapeutic Moisturizing Lotion, Fragrance-Free   Curel Therapeutic Moisturizing Lotion, Original Formula  Eucerin Daily Replenishing Lotion  Eucerin Dry Skin Therapy Plus Alpha Hydroxy Crme  Eucerin Dry Skin Therapy Plus Alpha Hydroxy Lotion  Eucerin Original Crme  Eucerin Original Lotion  Eucerin Plus Crme Eucerin Plus Lotion  Eucerin TriLipid Replenishing Lotion  Keri Anti-Bacterial Hand Lotion  Keri Deep Conditioning Original Lotion Dry Skin Formula Softly Scented  Keri Deep Conditioning Original Lotion, Fragrance Free Sensitive Skin Formula  Keri Lotion Fast Absorbing Fragrance Free Sensitive Skin Formula  Keri Lotion Fast Absorbing Softly Scented Dry Skin Formula  Keri Original Lotion  Keri Skin Renewal Lotion Keri Silky Smooth Lotion  Keri Silky Smooth Sensitive Skin Lotion  Nivea Body Creamy Conditioning Oil  Nivea Body Extra Enriched Teacher, adult education Moisturizing Lotion Nivea Crme  Nivea Skin Firming Lotion  NutraDerm 30 Skin Lotion  NutraDerm Skin Lotion  NutraDerm Therapeutic Skin Cream  NutraDerm Therapeutic Skin Lotion  ProShield Protective Hand Cream  Provon moisturizing lotion  Please read over the following fact sheets that you were given.

## 2023-02-12 NOTE — Progress Notes (Signed)
PCP - Dr Quintin Alto Cardiologist - Dr Armanda Magic  Chest x-ray - n/a EKG - 03/31/22 Stress Test - 11/08/19 CE ECHO - 05/20/22 Cardiac Cath - n/a  ICD Pacemaker/Loop - n/a  Sleep Study -  Yes (01/2020) CPAP - uses CPAP nightly and pt also has mouth guard for when she travels  Diabetes - n/a  Aspirin and Blood Thinner Instructions:  n/a  ERAS - Clear liquids til 9 AM DOS PRE-SURGERY Ensure drink given at PAT appt. with instructions.   Anesthesia review: Yes  STOP now taking any Aspirin (unless otherwise instructed by your surgeon), Aleve, Naproxen, Ibuprofen, Motrin, Advil, Goody's, BC's, all herbal medications, fish oil, and all vitamins.   Coronavirus Screening Do you have any of the following symptoms:  Cough yes/no: No Fever (>100.74F)  yes/no: No Runny nose yes/no: No Sore throat yes/no: No Difficulty breathing/shortness of breath  yes/no: No  Have you traveled in the last 14 days and where? yes/no: No  Patient verbalized understanding of instructions that were given to them at the PAT appointment. Patient was also instructed that they will need to review over the PAT instructions again at home before surgery.

## 2023-02-13 ENCOUNTER — Other Ambulatory Visit: Payer: Self-pay

## 2023-02-13 ENCOUNTER — Encounter (HOSPITAL_COMMUNITY): Payer: Self-pay

## 2023-02-13 ENCOUNTER — Encounter (HOSPITAL_COMMUNITY)
Admission: RE | Admit: 2023-02-13 | Discharge: 2023-02-13 | Disposition: A | Discharge status: Home / Self Care | Payer: Medicare Other | Source: Ambulatory Visit | Attending: Orthopedic Surgery | Admitting: Orthopedic Surgery

## 2023-02-13 VITALS — BP 123/71 | HR 75 | Temp 98.5°F | Resp 18 | Ht 65.0 in | Wt 211.6 lb

## 2023-02-13 DIAGNOSIS — R0789 Other chest pain: Secondary | ICD-10-CM | POA: Insufficient documentation

## 2023-02-13 DIAGNOSIS — R0609 Other forms of dyspnea: Secondary | ICD-10-CM | POA: Insufficient documentation

## 2023-02-13 DIAGNOSIS — Z01812 Encounter for preprocedural laboratory examination: Secondary | ICD-10-CM | POA: Diagnosis not present

## 2023-02-13 DIAGNOSIS — G4733 Obstructive sleep apnea (adult) (pediatric): Secondary | ICD-10-CM | POA: Diagnosis not present

## 2023-02-13 DIAGNOSIS — E785 Hyperlipidemia, unspecified: Secondary | ICD-10-CM | POA: Diagnosis not present

## 2023-02-13 DIAGNOSIS — K219 Gastro-esophageal reflux disease without esophagitis: Secondary | ICD-10-CM | POA: Diagnosis not present

## 2023-02-13 DIAGNOSIS — F32A Depression, unspecified: Secondary | ICD-10-CM | POA: Diagnosis not present

## 2023-02-13 DIAGNOSIS — M797 Fibromyalgia: Secondary | ICD-10-CM | POA: Insufficient documentation

## 2023-02-13 DIAGNOSIS — F419 Anxiety disorder, unspecified: Secondary | ICD-10-CM | POA: Diagnosis not present

## 2023-02-13 DIAGNOSIS — I10 Essential (primary) hypertension: Secondary | ICD-10-CM | POA: Diagnosis not present

## 2023-02-13 DIAGNOSIS — Z01818 Encounter for other preprocedural examination: Secondary | ICD-10-CM

## 2023-02-13 HISTORY — DX: Other seasonal allergic rhinitis: J30.2

## 2023-02-13 HISTORY — DX: Unspecified hearing loss, unspecified ear: H91.90

## 2023-02-13 HISTORY — DX: Unspecified mononeuropathy of bilateral lower limbs: G57.93

## 2023-02-13 LAB — BASIC METABOLIC PANEL
Anion gap: 9 (ref 5–15)
BUN: 12 mg/dL (ref 8–23)
CO2: 30 mmol/L (ref 22–32)
Calcium: 9.8 mg/dL (ref 8.9–10.3)
Chloride: 99 mmol/L (ref 98–111)
Creatinine, Ser: 1.04 mg/dL — ABNORMAL HIGH (ref 0.44–1.00)
GFR, Estimated: 56 mL/min — ABNORMAL LOW (ref >60)
Glucose, Bld: 93 mg/dL (ref 70–99)
Potassium: 3.6 mmol/L (ref 3.5–5.1)
Sodium: 138 mmol/L (ref 135–145)

## 2023-02-13 LAB — TYPE AND SCREEN
ABO/RH(D): A POS
Antibody Screen: NEGATIVE

## 2023-02-13 LAB — CBC
HCT: 36.5 % (ref 36.0–46.0)
Hemoglobin: 12.3 g/dL (ref 12.0–15.0)
MCH: 29.6 pg (ref 26.0–34.0)
MCHC: 33.7 g/dL (ref 30.0–36.0)
MCV: 88 fL (ref 80.0–100.0)
Platelets: 259 10*3/uL (ref 150–400)
RBC: 4.15 MIL/uL (ref 3.87–5.11)
RDW: 13.6 % (ref 11.5–15.5)
WBC: 7.9 10*3/uL (ref 4.0–10.5)
nRBC: 0 % (ref 0.0–0.2)

## 2023-02-13 LAB — SURGICAL PCR SCREEN
MRSA, PCR: NEGATIVE
Staphylococcus aureus: NEGATIVE

## 2023-02-16 NOTE — Anesthesia Preprocedure Evaluation (Signed)
Anesthesia Evaluation    Airway        Dental   Pulmonary           Cardiovascular hypertension,      Neuro/Psych    GI/Hepatic   Endo/Other    Renal/GU      Musculoskeletal   Abdominal   Peds  Hematology   Anesthesia Other Findings   Reproductive/Obstetrics                              Anesthesia Physical Anesthesia Plan  ASA:   Anesthesia Plan:    Post-op Pain Management:    Induction:   PONV Risk Score and Plan:   Airway Management Planned:   Additional Equipment:   Intra-op Plan:   Post-operative Plan:   Informed Consent:   Plan Discussed with:   Anesthesia Plan Comments: (PAT note by Antionette Poles, PA-C: Follows with cardiology for hx of OSA on CPAP, HTN, HLD, atypical chest pain, chronic DOE. Coronary CTA in 11/2019 showed calcium score of 0. Echo 05/20/2022 showed EF 70-75%, grade 1 dd, nl RV function, no significant valvular abnormalities.   Other pertinent hx includes fibromyalgia, GERD, depression/anxiety.  Preop labs reviewed, unremarkable.   EKG 04/01/22: Sinus rhythm. Rate 62. Prolonged PR interval. Borderline intraventricular conduction delay. Minimal ST depression, inferior leads  TTE 05/20/22: 1. Left ventricular ejection fraction, by estimation, is 70 to 75%. The  left ventricle has hyperdynamic function. The left ventricle has no  regional wall motion abnormalities. Left ventricular diastolic parameters  are consistent with Grade I diastolic  dysfunction (impaired relaxation). The average left ventricular global  longitudinal strain is -26.9 %. The global longitudinal strain is normal.   2. Right ventricular systolic function is normal. The right ventricular  size is normal. There is normal pulmonary artery systolic pressure.   3. The mitral valve is grossly normal. No evidence of mitral valve  regurgitation. No evidence of mitral stenosis.   4. The aortic  valve is tricuspid. There is mild calcification of the  aortic valve. Aortic valve regurgitation is not visualized. No aortic  stenosis is present.   5. The inferior vena cava is normal in size with greater than 50%  respiratory variability, suggesting right atrial pressure of 3 mmHg.   Coronary CTA 12/02/19: IMPRESSION: 1. Coronary calcium score of 0. This was 0 percentile for age and sex matched control.   2.  Normal coronary origin with right dominance.   3.  No evidence of CAD.  CAD RADS 0.   4.  Consider no atherosclerotic causes of chest pain.  )         Anesthesia Quick Evaluation

## 2023-02-16 NOTE — Progress Notes (Signed)
Anesthesia Chart Review:  Follows with cardiology for hx of OSA on CPAP, HTN, HLD, atypical chest pain, chronic DOE. Coronary CTA in 11/2019 showed calcium score of 0. Echo 05/20/2022 showed EF 70-75%, grade 1 dd, nl RV function, no significant valvular abnormalities.   Other pertinent hx includes fibromyalgia, GERD, depression/anxiety.  Preop labs reviewed, unremarkable.   EKG 04/01/22: Sinus rhythm. Rate 62. Prolonged PR interval. Borderline intraventricular conduction delay. Minimal ST depression, inferior leads  TTE 05/20/22: 1. Left ventricular ejection fraction, by estimation, is 70 to 75%. The  left ventricle has hyperdynamic function. The left ventricle has no  regional wall motion abnormalities. Left ventricular diastolic parameters  are consistent with Grade I diastolic  dysfunction (impaired relaxation). The average left ventricular global  longitudinal strain is -26.9 %. The global longitudinal strain is normal.   2. Right ventricular systolic function is normal. The right ventricular  size is normal. There is normal pulmonary artery systolic pressure.   3. The mitral valve is grossly normal. No evidence of mitral valve  regurgitation. No evidence of mitral stenosis.   4. The aortic valve is tricuspid. There is mild calcification of the  aortic valve. Aortic valve regurgitation is not visualized. No aortic  stenosis is present.   5. The inferior vena cava is normal in size with greater than 50%  respiratory variability, suggesting right atrial pressure of 3 mmHg.   Coronary CTA 12/02/19: IMPRESSION: 1. Coronary calcium score of 0. This was 0 percentile for age and sex matched control.   2.  Normal coronary origin with right dominance.   3.  No evidence of CAD.  CAD RADS 0.   4.  Consider no atherosclerotic causes of chest pain.    Zannie Cove [facility] Short Stay Center/Anesthesiology Phone [phone] 02/16/2023 3:45 PM

## 2023-02-19 ENCOUNTER — Ambulatory Visit (HOSPITAL_COMMUNITY)
Admission: RE | Disposition: A | Discharge status: Home / Self Care | Payer: Self-pay | Source: Home / Self Care | Attending: Orthopedic Surgery

## 2023-02-19 ENCOUNTER — Ambulatory Visit (HOSPITAL_COMMUNITY): Payer: Medicare Other

## 2023-02-19 ENCOUNTER — Ambulatory Visit (HOSPITAL_COMMUNITY): Payer: Medicare Other | Admitting: Physician Assistant

## 2023-02-19 ENCOUNTER — Encounter (HOSPITAL_COMMUNITY): Payer: Self-pay | Admitting: Orthopedic Surgery

## 2023-02-19 ENCOUNTER — Ambulatory Visit (HOSPITAL_BASED_OUTPATIENT_CLINIC_OR_DEPARTMENT_OTHER): Payer: Medicare Other | Admitting: Anesthesiology

## 2023-02-19 ENCOUNTER — Observation Stay (HOSPITAL_COMMUNITY)
Admission: RE | Admit: 2023-02-19 | Discharge: 2023-02-20 | Disposition: A | Discharge status: Home / Self Care | Payer: Medicare Other | Attending: Orthopedic Surgery | Admitting: Orthopedic Surgery

## 2023-02-19 ENCOUNTER — Other Ambulatory Visit: Payer: Self-pay

## 2023-02-19 DIAGNOSIS — Z79899 Other long term (current) drug therapy: Secondary | ICD-10-CM | POA: Diagnosis not present

## 2023-02-19 DIAGNOSIS — M48061 Spinal stenosis, lumbar region without neurogenic claudication: Principal | ICD-10-CM | POA: Insufficient documentation

## 2023-02-19 DIAGNOSIS — M960 Pseudarthrosis after fusion or arthrodesis: Secondary | ICD-10-CM | POA: Diagnosis not present

## 2023-02-19 DIAGNOSIS — I10 Essential (primary) hypertension: Secondary | ICD-10-CM | POA: Diagnosis not present

## 2023-02-19 DIAGNOSIS — Z96652 Presence of left artificial knee joint: Secondary | ICD-10-CM | POA: Diagnosis not present

## 2023-02-19 DIAGNOSIS — M5116 Intervertebral disc disorders with radiculopathy, lumbar region: Secondary | ICD-10-CM | POA: Diagnosis not present

## 2023-02-19 DIAGNOSIS — M5126 Other intervertebral disc displacement, lumbar region: Secondary | ICD-10-CM | POA: Diagnosis not present

## 2023-02-19 DIAGNOSIS — M5416 Radiculopathy, lumbar region: Principal | ICD-10-CM | POA: Diagnosis present

## 2023-02-19 DIAGNOSIS — Z981 Arthrodesis status: Secondary | ICD-10-CM | POA: Diagnosis not present

## 2023-02-19 HISTORY — PX: TRANSFORAMINAL LUMBAR INTERBODY FUSION (TLIF) WITH PEDICLE SCREW FIXATION 1 LEVEL: SHX6141

## 2023-02-19 SURGERY — TRANSFORAMINAL LUMBAR INTERBODY FUSION (TLIF) WITH PEDICLE SCREW FIXATION 1 LEVEL
Anesthesia: General | Laterality: Left

## 2023-02-19 MED ORDER — AMLODIPINE BESYLATE 5 MG PO TABS: 5.0000 mg | ORAL_TABLET | Freq: Every day | ORAL | Status: DC

## 2023-02-19 MED ORDER — ACETAMINOPHEN 325 MG PO TABS: 650.0000 mg | ORAL_TABLET | ORAL | Status: DC | PRN

## 2023-02-19 MED ORDER — OXYCODONE-ACETAMINOPHEN 5-325 MG PO TABS
1.0000 | ORAL_TABLET | ORAL | Status: DC | PRN
  Administered 2023-02-19 – 2023-02-20 (×5): 2 via ORAL
  Filled 2023-02-19 (×5): qty 2

## 2023-02-19 MED ORDER — ONDANSETRON HCL 4 MG/2ML IJ SOLN: 4.0000 mg | Freq: Four times a day (QID) | INTRAMUSCULAR | Status: DC | PRN

## 2023-02-19 MED ORDER — VITAMIN D 25 MCG (1000 UNIT) PO TABS
1000.0000 [IU] | ORAL_TABLET | Freq: Every day | ORAL | Status: DC
  Administered 2023-02-19: 1000 [IU] via ORAL
  Filled 2023-02-19: qty 1

## 2023-02-19 MED ORDER — PROPOFOL 10 MG/ML IV BOLUS
INTRAVENOUS | Status: DC | PRN
  Administered 2023-02-19: 140 mg via INTRAVENOUS

## 2023-02-19 MED ORDER — HYDROMORPHONE HCL 1 MG/ML IJ SOLN
INTRAMUSCULAR | Status: AC
  Filled 2023-02-19: qty 0.5

## 2023-02-19 MED ORDER — NAPHAZOLINE-PHENIRAMINE 0.025-0.3 % OP SOLN: 1.0000 [drp] | Freq: Three times a day (TID) | OPHTHALMIC | Status: DC | PRN

## 2023-02-19 MED ORDER — CEFAZOLIN SODIUM-DEXTROSE 2-4 GM/100ML-% IV SOLN
2.0000 g | INTRAVENOUS | Status: AC
  Administered 2023-02-19: 2 g via INTRAVENOUS

## 2023-02-19 MED ORDER — SENNOSIDES-DOCUSATE SODIUM 8.6-50 MG PO TABS
2.0000 | ORAL_TABLET | Freq: Every day | ORAL | Status: DC
  Administered 2023-02-19 – 2023-02-20 (×2): 2 via ORAL
  Filled 2023-02-19 (×2): qty 2

## 2023-02-19 MED ORDER — ONDANSETRON HCL 4 MG/2ML IJ SOLN
INTRAMUSCULAR | Status: DC | PRN
  Administered 2023-02-19: 4 mg via INTRAVENOUS

## 2023-02-19 MED ORDER — GABAPENTIN 300 MG PO CAPS
300.0000 mg | ORAL_CAPSULE | ORAL | Status: DC | PRN
Start: 2023-02-19 — End: 2023-02-20

## 2023-02-19 MED ORDER — MIDAZOLAM HCL 2 MG/2ML IJ SOLN
INTRAMUSCULAR | Status: AC
  Filled 2023-02-19: qty 2

## 2023-02-19 MED ORDER — ONDANSETRON HCL 4 MG/2ML IJ SOLN
INTRAMUSCULAR | Status: AC
  Filled 2023-02-19: qty 2

## 2023-02-19 MED ORDER — MENTHOL 3 MG MT LOZG
1.0000 | LOZENGE | OROMUCOSAL | Status: DC | PRN
  Filled 2023-02-19: qty 9

## 2023-02-19 MED ORDER — METHOCARBAMOL 500 MG PO TABS
500.0000 mg | ORAL_TABLET | Freq: Four times a day (QID) | ORAL | Status: DC | PRN
  Administered 2023-02-19 (×2): 500 mg via ORAL
  Filled 2023-02-19: qty 1

## 2023-02-19 MED ORDER — ONDANSETRON HCL 4 MG PO TABS: 4.0000 mg | ORAL_TABLET | Freq: Four times a day (QID) | ORAL | Status: DC | PRN

## 2023-02-19 MED ORDER — ROCURONIUM BROMIDE 10 MG/ML (PF) SYRINGE
PREFILLED_SYRINGE | INTRAVENOUS | Status: AC
  Filled 2023-02-19: qty 10

## 2023-02-19 MED ORDER — ROCURONIUM BROMIDE 10 MG/ML (PF) SYRINGE
PREFILLED_SYRINGE | INTRAVENOUS | Status: DC | PRN
  Administered 2023-02-19: 50 mg via INTRAVENOUS
  Administered 2023-02-19: 20 mg via INTRAVENOUS
  Administered 2023-02-19: 10 mg via INTRAVENOUS
  Administered 2023-02-19: 20 mg via INTRAVENOUS

## 2023-02-19 MED ORDER — CHLORHEXIDINE GLUCONATE 0.12 % MT SOLN: 15.0000 mL | Freq: Once | OROMUCOSAL | Status: AC

## 2023-02-19 MED ORDER — SODIUM CHLORIDE 0.9% FLUSH
3.0000 mL | Freq: Two times a day (BID) | INTRAVENOUS | Status: DC
  Administered 2023-02-19: 3 mL via INTRAVENOUS

## 2023-02-19 MED ORDER — CHLORHEXIDINE GLUCONATE 0.12 % MT SOLN
OROMUCOSAL | Status: AC
  Administered 2023-02-19: 15 mL via OROMUCOSAL
  Filled 2023-02-19: qty 15

## 2023-02-19 MED ORDER — ALUM & MAG HYDROXIDE-SIMETH 200-200-20 MG/5ML PO SUSP: 30.0000 mL | Freq: Four times a day (QID) | ORAL | Status: DC | PRN

## 2023-02-19 MED ORDER — OXYCODONE HCL 5 MG PO TABS: 5.0000 mg | ORAL_TABLET | Freq: Once | ORAL | Status: DC | PRN

## 2023-02-19 MED ORDER — BUPIVACAINE-EPINEPHRINE (PF) 0.25% -1:200000 IJ SOLN
INTRAMUSCULAR | Status: AC
  Filled 2023-02-19: qty 30

## 2023-02-19 MED ORDER — LIDOCAINE 2% (20 MG/ML) 5 ML SYRINGE
INTRAMUSCULAR | Status: AC
  Filled 2023-02-19: qty 5

## 2023-02-19 MED ORDER — SODIUM CHLORIDE 0.9% FLUSH: 3.0000 mL | Freq: Two times a day (BID) | INTRAVENOUS | Status: DC

## 2023-02-19 MED ORDER — PHENYLEPHRINE 80 MCG/ML (10ML) SYRINGE FOR IV PUSH (FOR BLOOD PRESSURE SUPPORT)
PREFILLED_SYRINGE | INTRAVENOUS | Status: DC | PRN
  Administered 2023-02-19: 160 ug via INTRAVENOUS

## 2023-02-19 MED ORDER — BUPIVACAINE-EPINEPHRINE 0.25% -1:200000 IJ SOLN
INTRAMUSCULAR | Status: DC | PRN
  Administered 2023-02-19: 9 mL
  Administered 2023-02-19: 20 mL

## 2023-02-19 MED ORDER — METHOCARBAMOL 500 MG PO TABS
ORAL_TABLET | ORAL | Status: AC
  Filled 2023-02-19: qty 1

## 2023-02-19 MED ORDER — PHENOL 1.4 % MT LIQD: 1.0000 | OROMUCOSAL | Status: DC | PRN

## 2023-02-19 MED ORDER — MIDAZOLAM HCL 2 MG/2ML IJ SOLN
INTRAMUSCULAR | Status: DC | PRN
  Administered 2023-02-19: 1 mg via INTRAVENOUS

## 2023-02-19 MED ORDER — PROBIOTIC DAILY PO CAPS: ORAL_CAPSULE | Freq: Every day | ORAL | Status: DC

## 2023-02-19 MED ORDER — BUPIVACAINE LIPOSOME 1.3 % IJ SUSP
INTRAMUSCULAR | Status: AC
Start: 2023-02-19 — End: ?
  Filled 2023-02-19: qty 20

## 2023-02-19 MED ORDER — ACETAMINOPHEN 650 MG RE SUPP: 650.0000 mg | RECTAL | Status: DC | PRN

## 2023-02-19 MED ORDER — DEXAMETHASONE SODIUM PHOSPHATE 10 MG/ML IJ SOLN
INTRAMUSCULAR | Status: DC | PRN
  Administered 2023-02-19: 10 mg via INTRAVENOUS

## 2023-02-19 MED ORDER — HYDROCODONE-ACETAMINOPHEN 5-325 MG PO TABS: 1.0000 | ORAL_TABLET | ORAL | Status: DC | PRN

## 2023-02-19 MED ORDER — HYDROMORPHONE HCL 1 MG/ML IJ SOLN
INTRAMUSCULAR | Status: DC | PRN
  Administered 2023-02-19 (×2): .25 mg via INTRAVENOUS

## 2023-02-19 MED ORDER — CARVEDILOL 25 MG PO TABS
25.0000 mg | ORAL_TABLET | Freq: Two times a day (BID) | ORAL | Status: DC
  Administered 2023-02-19 – 2023-02-20 (×2): 25 mg via ORAL
  Filled 2023-02-19 (×2): qty 1

## 2023-02-19 MED ORDER — DOCUSATE SODIUM 100 MG PO CAPS
100.0000 mg | ORAL_CAPSULE | Freq: Two times a day (BID) | ORAL | Status: DC
  Administered 2023-02-19: 100 mg via ORAL
  Filled 2023-02-19: qty 1

## 2023-02-19 MED ORDER — BUPIVACAINE LIPOSOME 1.3 % IJ SUSP
INTRAMUSCULAR | Status: DC | PRN
  Administered 2023-02-19: 20 mL

## 2023-02-19 MED ORDER — FENTANYL CITRATE (PF) 250 MCG/5ML IJ SOLN
INTRAMUSCULAR | Status: AC
  Filled 2023-02-19: qty 5

## 2023-02-19 MED ORDER — POVIDONE-IODINE 7.5 % EX SOLN: Freq: Once | CUTANEOUS | Status: DC

## 2023-02-19 MED ORDER — FLUTICASONE PROPIONATE 50 MCG/ACT NA SUSP: 1.0000 | Freq: Every day | NASAL | Status: DC | PRN

## 2023-02-19 MED ORDER — THROMBIN (RECOMBINANT) 20000 UNITS EX SOLR
CUTANEOUS | Status: AC
  Filled 2023-02-19: qty 20000

## 2023-02-19 MED ORDER — OXYCODONE HCL 5 MG/5ML PO SOLN: 5.0000 mg | Freq: Once | ORAL | Status: DC | PRN

## 2023-02-19 MED ORDER — CHLORTHALIDONE 25 MG PO TABS: 25.0000 mg | ORAL_TABLET | Freq: Every morning | ORAL | Status: DC

## 2023-02-19 MED ORDER — FLEET ENEMA RE ENEM: 1.0000 | ENEMA | Freq: Once | RECTAL | Status: DC | PRN

## 2023-02-19 MED ORDER — LIDOCAINE 2% (20 MG/ML) 5 ML SYRINGE
INTRAMUSCULAR | Status: DC | PRN
  Administered 2023-02-19: 60 mg via INTRAVENOUS

## 2023-02-19 MED ORDER — LACTATED RINGERS IV SOLN: INTRAVENOUS | Status: DC

## 2023-02-19 MED ORDER — METHOCARBAMOL 1000 MG/10ML IJ SOLN: 500.0000 mg | Freq: Four times a day (QID) | INTRAMUSCULAR | Status: DC | PRN

## 2023-02-19 MED ORDER — ALPRAZOLAM 0.25 MG PO TABS
0.2500 mg | ORAL_TABLET | Freq: Two times a day (BID) | ORAL | Status: DC
  Administered 2023-02-19: 0.25 mg via ORAL
  Administered 2023-02-20: 0.5 mg via ORAL
  Filled 2023-02-19: qty 1
  Filled 2023-02-19: qty 2

## 2023-02-19 MED ORDER — FENTANYL CITRATE (PF) 100 MCG/2ML IJ SOLN
25.0000 ug | INTRAMUSCULAR | Status: DC | PRN
  Administered 2023-02-19 (×2): 50 ug via INTRAVENOUS

## 2023-02-19 MED ORDER — ORAL CARE MOUTH RINSE: 15.0000 mL | Freq: Once | OROMUCOSAL | Status: AC

## 2023-02-19 MED ORDER — CEFAZOLIN SODIUM-DEXTROSE 2-4 GM/100ML-% IV SOLN
INTRAVENOUS | Status: AC
  Filled 2023-02-19: qty 100

## 2023-02-19 MED ORDER — CEFAZOLIN SODIUM-DEXTROSE 2-4 GM/100ML-% IV SOLN
2.0000 g | Freq: Three times a day (TID) | INTRAVENOUS | Status: AC
  Administered 2023-02-19 – 2023-02-20 (×2): 2 g via INTRAVENOUS
  Filled 2023-02-19 (×2): qty 100

## 2023-02-19 MED ORDER — BISACODYL 5 MG PO TBEC: 5.0000 mg | DELAYED_RELEASE_TABLET | Freq: Every day | ORAL | Status: DC | PRN

## 2023-02-19 MED ORDER — 0.9 % SODIUM CHLORIDE (POUR BTL) OPTIME
TOPICAL | Status: DC | PRN
  Administered 2023-02-19: 1000 mL

## 2023-02-19 MED ORDER — SURGIFLO WITH THROMBIN (HEMOSTATIC MATRIX KIT) OPTIME
TOPICAL | Status: DC | PRN
  Administered 2023-02-19: 1 via TOPICAL

## 2023-02-19 MED ORDER — ZOLPIDEM TARTRATE 5 MG PO TABS: 5.0000 mg | ORAL_TABLET | Freq: Every evening | ORAL | Status: DC | PRN

## 2023-02-19 MED ORDER — SODIUM CHLORIDE 0.9% FLUSH: 3.0000 mL | INTRAVENOUS | Status: DC | PRN

## 2023-02-19 MED ORDER — IRBESARTAN 300 MG PO TABS
300.0000 mg | ORAL_TABLET | Freq: Every day | ORAL | Status: DC
  Administered 2023-02-19: 300 mg via ORAL
  Filled 2023-02-19: qty 2
  Filled 2023-02-19 (×2): qty 1

## 2023-02-19 MED ORDER — SIMVASTATIN 20 MG PO TABS
20.0000 mg | ORAL_TABLET | Freq: Every day | ORAL | Status: DC
  Administered 2023-02-19: 20 mg via ORAL
  Filled 2023-02-19: qty 1

## 2023-02-19 MED ORDER — PROPOFOL 10 MG/ML IV BOLUS
INTRAVENOUS | Status: AC
  Filled 2023-02-19: qty 20

## 2023-02-19 MED ORDER — FENTANYL CITRATE (PF) 100 MCG/2ML IJ SOLN
INTRAMUSCULAR | Status: AC
  Filled 2023-02-19: qty 2

## 2023-02-19 MED ORDER — RISAQUAD PO CAPS
1.0000 | ORAL_CAPSULE | Freq: Every day | ORAL | Status: DC
Start: 2023-02-19 — End: 2023-02-20
  Administered 2023-02-19 – 2023-02-20 (×2): 1 via ORAL
  Filled 2023-02-19 (×2): qty 1

## 2023-02-19 MED ORDER — THROMBIN 20000 UNITS EX SOLR
CUTANEOUS | Status: DC | PRN
  Administered 2023-02-19: 20000 [IU] via TOPICAL

## 2023-02-19 MED ORDER — MORPHINE SULFATE (PF) 2 MG/ML IV SOLN: 1.0000 mg | INTRAVENOUS | Status: DC | PRN

## 2023-02-19 MED ORDER — MONTELUKAST SODIUM 10 MG PO TABS: 10.0000 mg | ORAL_TABLET | Freq: Every day | ORAL | Status: DC

## 2023-02-19 MED ORDER — NAPHAZOLINE-PHENIRAMINE 0.027-0.315 % OP SOLN: Freq: Three times a day (TID) | OPHTHALMIC | Status: DC | PRN

## 2023-02-19 MED ORDER — SUGAMMADEX SODIUM 200 MG/2ML IV SOLN
INTRAVENOUS | Status: DC | PRN
  Administered 2023-02-19: 200 mg via INTRAVENOUS

## 2023-02-19 MED ORDER — DULOXETINE HCL 60 MG PO CPEP
60.0000 mg | ORAL_CAPSULE | Freq: Two times a day (BID) | ORAL | Status: DC
  Administered 2023-02-19 – 2023-02-20 (×2): 60 mg via ORAL
  Filled 2023-02-19 (×2): qty 1

## 2023-02-19 MED ORDER — FENTANYL CITRATE (PF) 250 MCG/5ML IJ SOLN
INTRAMUSCULAR | Status: DC | PRN
  Administered 2023-02-19: 150 ug via INTRAVENOUS
  Administered 2023-02-19: 50 ug via INTRAVENOUS
  Administered 2023-02-19 (×2): 25 ug via INTRAVENOUS

## 2023-02-19 MED ORDER — SODIUM CHLORIDE 0.9 % IV SOLN
250.0000 mL | INTRAVENOUS | Status: DC
  Administered 2023-02-19: 250 mL via INTRAVENOUS

## 2023-02-19 SURGICAL SUPPLY — 82 items
BAG COUNTER SPONGE SURGICOUNT (BAG) ×1 IMPLANT
BENZOIN TINCTURE PRP APPL 2/3 (GAUZE/BANDAGES/DRESSINGS) ×1 IMPLANT
BLADE CLIPPER SURG (BLADE) IMPLANT
BUR PRESCISION 1.7 ELITE (BURR) ×1 IMPLANT
BUR ROUND FLUTED 5 RND (BURR) ×1 IMPLANT
BUR ROUND PRECISION 4.0 (BURR) IMPLANT
BUR SABER RD CUTTING 3.0 (BURR) IMPLANT
CAGE SABLE 10X22 6-12 0D (Cage) IMPLANT
CANNULA GRAFT BNE VG PRE-FILL (Bone Implant) IMPLANT
CNTNR URN SCR LID CUP LEK RST (MISCELLANEOUS) ×1 IMPLANT
CONNECTOR EXPEDIUM TI 55MM (Connector) IMPLANT
COVER BACK TABLE 60X90IN (DRAPES) ×1 IMPLANT
COVER MAYO STAND STRL (DRAPES) ×2 IMPLANT
COVER SURGICAL LIGHT HANDLE (MISCELLANEOUS) ×1 IMPLANT
DISPENSER GRAFT BNE VG (MISCELLANEOUS) IMPLANT
DISPENSER VIVIGEN BONE GRAFT (MISCELLANEOUS) ×1
DRAIN CHANNEL 15F RND FF W/TCR (WOUND CARE) IMPLANT
DRAPE C-ARM 42X72 X-RAY (DRAPES) ×1 IMPLANT
DRAPE C-ARMOR (DRAPES) IMPLANT
DRAPE POUCH INSTRU U-SHP 10X18 (DRAPES) ×1 IMPLANT
DRAPE SURG 17X23 STRL (DRAPES) ×4 IMPLANT
DURAPREP 26ML APPLICATOR (WOUND CARE) ×1 IMPLANT
ELECT BLADE 4.0 EZ CLEAN MEGAD (MISCELLANEOUS) ×1
ELECT CAUTERY BLADE 6.4 (BLADE) ×1 IMPLANT
ELECT REM PT RETURN 9FT ADLT (ELECTROSURGICAL) ×1
ELECTRODE BLDE 4.0 EZ CLN MEGD (MISCELLANEOUS) ×1 IMPLANT
ELECTRODE REM PT RTRN 9FT ADLT (ELECTROSURGICAL) ×1 IMPLANT
EVACUATOR SILICONE 100CC (DRAIN) IMPLANT
FILTER STRAW FLUID ASPIR (MISCELLANEOUS) ×1 IMPLANT
GAUZE 4X4 16PLY ~~LOC~~+RFID DBL (SPONGE) ×1 IMPLANT
GAUZE SPONGE 4X4 12PLY STRL (GAUZE/BANDAGES/DRESSINGS) ×1 IMPLANT
GLOVE BIO SURGEON STRL SZ 6.5 (GLOVE) ×1 IMPLANT
GLOVE BIO SURGEON STRL SZ8 (GLOVE) ×1 IMPLANT
GLOVE BIOGEL PI IND STRL 7.0 (GLOVE) ×1 IMPLANT
GLOVE BIOGEL PI IND STRL 8 (GLOVE) ×1 IMPLANT
GLOVE SURG ENC MOIS LTX SZ6.5 (GLOVE) ×1 IMPLANT
GOWN STRL REUS W/ TWL LRG LVL3 (GOWN DISPOSABLE) ×2 IMPLANT
GOWN STRL REUS W/ TWL XL LVL3 (GOWN DISPOSABLE) ×1 IMPLANT
GRAFT BONE CANNULA VIVIGEN 3 (Bone Implant) ×2 IMPLANT
IV CATH 14GX2 1/4 (CATHETERS) ×1 IMPLANT
KIT BASIN OR (CUSTOM PROCEDURE TRAY) ×1 IMPLANT
KIT POSITION SURG JACKSON T1 (MISCELLANEOUS) ×1 IMPLANT
KIT TURNOVER KIT B (KITS) ×1 IMPLANT
MARKER SKIN DUAL TIP RULER LAB (MISCELLANEOUS) ×2 IMPLANT
NDL 18GX1X1/2 (RX/OR ONLY) (NEEDLE) ×1 IMPLANT
NDL 22X1.5 STRL (OR ONLY) (MISCELLANEOUS) ×2 IMPLANT
NDL HYPO 25GX1X1/2 BEV (NEEDLE) ×1 IMPLANT
NDL SPNL 18GX3.5 QUINCKE PK (NEEDLE) ×2 IMPLANT
NEEDLE 18GX1X1/2 (RX/OR ONLY) (NEEDLE) ×1
NEEDLE 22X1.5 STRL (OR ONLY) (MISCELLANEOUS) ×2
NEEDLE HYPO 25GX1X1/2 BEV (NEEDLE) ×1
NEEDLE SPNL 18GX3.5 QUINCKE PK (NEEDLE) ×2
NS IRRIG 1000ML POUR BTL (IV SOLUTION) ×1 IMPLANT
PACK LAMINECTOMY ORTHO (CUSTOM PROCEDURE TRAY) ×1 IMPLANT
PACK UNIVERSAL I (CUSTOM PROCEDURE TRAY) ×1 IMPLANT
PAD ARMBOARD 7.5X6 YLW CONV (MISCELLANEOUS) ×2 IMPLANT
PATTIES SURGICAL .5 X1 (DISPOSABLE) ×1 IMPLANT
PATTIES SURGICAL .5X1.5 (GAUZE/BANDAGES/DRESSINGS) ×1 IMPLANT
PUTTY DBX 2.5CC (Putty) ×1 IMPLANT
PUTTY DBX 2.5CC DEPUY (Putty) IMPLANT
ROD SPINAL EXP 5.5X60 (Rod) IMPLANT
SCREW CORTICAL VIPER 7X35 (Screw) IMPLANT
SCREW SET SINGLE INNER (Screw) IMPLANT
SPONGE INTESTINAL PEANUT (DISPOSABLE) ×1 IMPLANT
SPONGE SURGIFOAM ABS GEL 100 (HEMOSTASIS) ×1 IMPLANT
STRIP CLOSURE SKIN 1/2X4 (GAUZE/BANDAGES/DRESSINGS) ×2 IMPLANT
SURGIFLO W/THROMBIN 8M KIT (HEMOSTASIS) IMPLANT
SUT MNCRL AB 4-0 PS2 18 (SUTURE) ×1 IMPLANT
SUT VIC AB 0 CT1 18XCR BRD 8 (SUTURE) ×1 IMPLANT
SUT VIC AB 1 CT1 18XCR BRD 8 (SUTURE) ×1 IMPLANT
SUT VIC AB 2-0 CT2 18 VCP726D (SUTURE) ×1 IMPLANT
SYR 20ML LL LF (SYRINGE) ×2 IMPLANT
SYR BULB IRRIG 60ML STRL (SYRINGE) ×1 IMPLANT
SYR CONTROL 10ML LL (SYRINGE) ×2 IMPLANT
SYR TB 1ML LUER SLIP (SYRINGE) ×1 IMPLANT
TAP EXPEDIUM DL 5.0 (INSTRUMENTS) IMPLANT
TAP EXPEDIUM DL 6.0 (INSTRUMENTS) IMPLANT
TAP EXPEDIUM DL 7X2 (INSTRUMENTS) IMPLANT
TRAY FOLEY MTR SLVR 16FR STAT (SET/KITS/TRAYS/PACK) ×1 IMPLANT
TUBE FUNNEL GL DISP (ORTHOPEDIC DISPOSABLE SUPPLIES) IMPLANT
WATER STERILE IRR 1000ML POUR (IV SOLUTION) ×1 IMPLANT
YANKAUER SUCT BULB TIP NO VENT (SUCTIONS) ×1 IMPLANT

## 2023-02-19 NOTE — Op Note (Addendum)
 PATIENT NAME: [name]   MEDICAL RECORD NO.:   [id number]   DATE OF BIRTH: [birth year]   DATE OF PROCEDURE: 02/19/2023                               OPERATIVE REPORT     PREOPERATIVE DIAGNOSES: 1. Left-sided lumbar radiculopathy 2. L2-3 spinal stenosis 3. Status post previous fusion with instrumentation L3-4, L4-5 4. Possible nonunion, L3-4   POSTOPERATIVE DIAGNOSES: 1. Left-sided lumbar radiculopathy 2. L2-3 spinal stenosis 3. Status post previous fusion with instrumentation L3-4, L4-5 4. Nonunion, L3-4   PROCEDURES: 1. L2-3 decompression 2. Left-sided L2-3 transforaminal lumbar interbody fusion. 3. Right-sided L2/3 posterolateral fusion 3. Revision posterior spinal fusion, L3-4 4. Insertion of interbody device x1 (Globus expandable intervertebral spacer). 5. Placement of segmental posterior instrumentation L2, bilaterally  6. Use of local autograft. 7. Use of morselized allograft - Vivigen 8. Intraoperative use of fluoroscopy.   SURGEON:  Oneil Priestly, MD.   ASSISTANTBETHA Ileana Clara, PA-C.   ANESTHESIA:  General endotracheal anesthesia.   COMPLICATIONS:  None.   DISPOSITION:  Stable.   ESTIMATED BLOOD LOSS:  100cc   INDICATIONS FOR SURGERY:  Briefly,  [name] is a pleasant 73 -year-old female, who did present to me with severe and ongoing pain and weakness in the left leg. I did feel that her symptoms were secondary to the findings noted above.   The patient failed conservative care and did wish to proceed with the procedure  noted above.  Of note, the patient's imaging prior to surgery was suggestive of a nonunion at L3-4.  This was to be assessed intraoperatively.  A nonunion was confirmed intraoperatively, and a posterior spinal fusion at L3-4 was also performed.   OPERATIVE DETAILS:  On 02/19/2023, the patient was brought to surgery and general endotracheal anesthesia was administered.  The patient was placed prone on a well-padded flat Jackson bed  with a spinal frame.  Antibiotics were given and a time-out procedure was performed. The back was prepped and draped in the usual fashion.  A midline incision was made overlying the L2-L5 levels.  The fascia was incised at the midline.  The paraspinal musculature was bluntly swept laterally.  Anatomic landmarks for the L2 pedicles were exposed. Using fluoroscopy, I did cannulate the L2 pedicles bilaterally, using a medial to lateral cortical trajectory technique.  On the right side, a 7 x 35 mm screw was advanced into the L2 pedicle.  On the left side, bone wax was placed into the cannulated pedicle hole.  At this point, I turned my attention to the patient's previously placed instrumentation.  The patient's bilateral pedicle screws and interconnecting rods spanning L3-L5 were noted.  The bilateral L3-4 facet joints were also noted.  As the fusion at L3-4 did not appear to be definitively fused, I did use a high-speed bur to decorticate the facet joints bilaterally at L3-4, as were the posterolateral gutters.  Vivigen was placed into the posterolateral gutters and facet joints at L3-4 to accomplish a bilateral posterolateral fusion.  At this point, I turned my attention toward the decompression aspect of the procedure.  A full L2-3 facetectomy was performed on the left, using a high-speed bur in addition to Kerrison punches.  The dura and traversing left L3 nerve was identified and medially retracted.  Readily noted was a large disc herniation occupying the left lateral recess.  Part of the herniation was migrated  inferiorly, behind the L3 vertebral body.  The herniated disc fragments were removed in their entirety.  With ongoing gentle medial mobilization of the left L3 nerve, I did perform  a thorough and complete L2-3 intervertebral discectomy.  The endplates were appropriately prepared.  At this point, Vivigen and autograft was packed into the intervertebral disc space.  A 6 mm implant was advanced  into the space as well, and was expanded to 7 mm in height.  Additional allograft and autograft was packed into the posterolateral gutter on the right side at L2-3.  The L2-3 facet joint and posterolateral gutter was decorticated prior to this.  At this point, a 7 x 35 mm screw was advanced into the L2 pedicle on the left.  Cross connectors were then secured over the previously placed rods from the patient's previous surgery.  This was secured between the L3 and L4 pedicle screws bilaterally.  60 mm rods were then advanced over the tulip heads of the screws at L2, and into the cross connector inferiorly, previously secured to the rods from the patient's previous surgery.  Caps were then placed over the rods into the tulip heads of the L2 pedicle screws, and a final locking procedure was performed over the L2 pedicle screws, and the cross connector was locked to both the previously placed rods, and the newly placed rods.  The wound was copiously irrigated prior to placing the bone graft.  The wound was explored for any undue bleeding and there were minor areas of epidural bleeding which was controlled using bipolar electrocautery in addition to Surgiflo.  Gel-Foam was placed over the laminectomy site.  The wound was then closed in layers using #1 Vicryl followed by 2-0 Vicryl, followed by 4-0 Monocryl.  Benzoin and Steri-Strips were applied followed by sterile dressing.     Of note, Ileana Clara was my assistant throughout surgery, and did aid in retraction, suctioning, the decompression, placement of the hardware, and closure.     Oneil Priestly, MD

## 2023-02-19 NOTE — Transfer of Care (Signed)
 Immediate Anesthesia Transfer of Care Note  Patient: [name]  Procedure(s) Performed: LEFT-SIDED LUMBAR 2 - LUMBAR 3 TRANSFORAMINAL LUMBAR INTERBODY FUSION AND DECOMPRESSION WITH INSTRUMENTATION AND ALLOGRAFT (Left)  Patient Location: PACU  Anesthesia Type:General  Level of Consciousness: drowsy  Airway & Oxygen  Therapy: Patient Spontanous Breathing and Patient connected to face mask oxygen   Post-op Assessment: Report given to RN and Post -op Vital signs reviewed and stable  Post vital signs: Reviewed and stable  Last Vitals:  Vitals Value Taken Time  BP 149/73 02/19/23 1520  Temp 36.9 C 02/19/23 1520  Pulse 81 02/19/23 1526  Resp 14 02/19/23 1526  SpO2 99 % 02/19/23 1526  Vitals shown include unfiled device data.  Last Pain:  Vitals:   02/19/23 0912  TempSrc:   PainSc: 0-No pain         Complications: No notable events documented.

## 2023-02-19 NOTE — Plan of Care (Signed)

## 2023-02-19 NOTE — Anesthesia Procedure Notes (Signed)
 Procedure Name: Intubation Date/Time: 02/19/2023 11:39 AM  Performed by: Vera Rochele PARAS, CRNAPre-anesthesia Checklist: Patient identified, Emergency Drugs available, Suction available and Patient being monitored Patient Re-evaluated:Patient Re-evaluated prior to induction Oxygen  Delivery Method: Circle System Utilized Preoxygenation: Pre-oxygenation with 100% oxygen  Induction Type: IV induction Ventilation: Mask ventilation without difficulty Laryngoscope Size: Miller and 3 Grade View: Grade II Tube type: Oral Tube size: 7.0 mm Number of attempts: 1 Airway Equipment and Method: Stylet and Oral airway Placement Confirmation: ETT inserted through vocal cords under direct vision, positive ETCO2 and breath sounds checked- equal and bilateral Secured at: 22 cm Tube secured with: Tape Dental Injury: Teeth and Oropharynx as per pre-operative assessment

## 2023-02-19 NOTE — H&P (Signed)
 PREOPERATIVE H&P  Chief Complaint: Left leg pain  HPI: [name] is a 73 y.o. female who presents with ongoing pain in the left leg  MRI reveals stenosis at L2/3, the level above the patient's previous fusion  Patient has failed multiple forms of conservative care and continues to have pain (see office notes for additional details regarding the patient's full course of treatment)  Past Medical History:  Diagnosis Date   Anxiety    Arthritis    hands, feet   Back pain    chronic   Blurred vision    hx of   Cataracts, bilateral    MD just watching right now - pt had surgery to remove   Complication of anesthesia    pt states at preop after back surgery she had to sit up all nite ? related to sleep apnea pt not  sure   Cystitides, interstitial, chronic    Depression    Fibromyalgia    GERD (gastroesophageal reflux disease)    Gout    feet - no current problem   Hearing loss    bilateral - no hearing aids   High cholesterol    Hypertension    Interstitial cystitis    hx of   Neuropathy of both feet    Pneumonia    Seasonal allergies    Shortness of breath    with exertion   Sleep apnea    Uses CPAP nightly but pt has appliance that she uses with travel.   SVD (spontaneous vaginal delivery)    x 3   Urinary disorder    bladder is in sling per pt   Urinary tract infection    Vertigo    hx of   Wears glasses    Yeast infection    hx of multiple yeast infections per pt   Past Surgical History:  Procedure Laterality Date   ABDOMINAL HYSTERECTOMY  1997   [place] [facility]   ANTERIOR LAT LUMBAR FUSION Right 04/28/2018   Procedure: ANTERIOR LATERAL INNER BODY LUMBAR FUSION L3-4, L4-5 2 LEVELS WITH INSTRUMENTATION AND ALLOGRAFT;  Surgeon: Beuford Anes, MD;  Location: [facility] OR;  Service: Orthopedics;  Laterality: Right;   BACK SURGERY  10/20/2017   bladder tack     COLONOSCOPY N/A 08/23/2014   Procedure: COLONOSCOPY;  Surgeon: Claudis RAYMOND Rivet, MD;   Location: [facility] END[facility] SUITE;  Service: Endoscopy;  Laterality: N/A;  1200   CYSTO WITH HYDRODISTENSION N/A 04/18/2019   Procedure: CYSTOSCOPY/HYDRODISTENSION;  Surgeon: Sherrilee Belvie CROME, MD;  Location: [facility] ORS;  Service: Urology;  Laterality: N/A;   EYE SURGERY Bilateral    remove cataracts   KNEE SURGERY Left 05/26/2014   meniscus tear   LAPAROSCOPIC SALPINGO OOPHERECTOMY  02/17/2012   Procedure: LAPAROSCOPIC SALPINGO OOPHORECTOMY;  Surgeon: Norleen LULLA Server, MD;  Location: [facility] ORS;  Service: Gynecology;  Laterality: Bilateral;   LASIK Bilateral    TOE SURGERY     TOTAL KNEE ARTHROPLASTY Left 07/09/2021   Procedure: TOTAL KNEE ARTHROPLASTY;  Surgeon: Josefina Chew, MD;  Location: [facility] ORS;  Service: Orthopedics;  Laterality: Left;   UPPER GI ENDOSCOPY     Social History   Socioeconomic History   Marital status: Married    Spouse name: Lynwood   Number of children: Not on file   Years of education: college   Highest education level: Not on file  Occupational History    Employer: UNEMPLOYED  Tobacco Use   Smoking status: Never  Smokeless tobacco: Never  Vaping Use   Vaping status: Never Used  Substance and Sexual Activity   Alcohol  use: Not Currently    Comment: rare   Drug use: No   Sexual activity: Not Currently    Birth control/protection: Surgical, Post-menopausal    Comment: hysterectomy  Other Topics Concern   Not on file  Social History Narrative   Patient lives at home with her family.   Caffeine Use: 1 cup daily   Social Drivers of Corporate Investment Banker Strain: Not on file  Food Insecurity: Not on file  Transportation Needs: Not on file  Physical Activity: Not on file  Stress: Not on file  Social Connections: Not on file   Family History  Problem Relation Age of Onset   Atrial fibrillation Mother    COPD Mother    Hypertension Mother    Hypertension Father    Cancer Father        throat    Drug abuse Brother    Alcohol  abuse Brother    Early death  Brother    Fibromyalgia Daughter    Paranoid behavior Son    Alcohol  abuse Son    Hypertension Brother    Sleep apnea Brother    Alcohol  abuse Brother    Drug abuse Brother    No Known Allergies Prior to Admission medications   Medication Sig Start Date End Date Taking? Authorizing Provider  acetaminophen  (TYLENOL ) 500 MG tablet Take 500 mg by mouth every 6 (six) hours as needed for moderate pain.    [provider]  ALPRAZolam  (XANAX ) 0.5 MG tablet Take 0.25-0.5 mg by mouth 2 (two) times daily. Pt sometimes take 1 tablet 08/13/17   [provider]  amLODipine  (NORVASC ) 5 MG tablet Take 5 mg by mouth daily. 04/09/21   [provider]  CAMPHOR-MENTHOL  EX Apply 1 application topically 3 (three) times daily as needed (for pain).     [provider]  carvedilol  (COREG ) 25 MG tablet TAKE ONE TABLET BY MOUTH TWICE DAILY 06/26/22   Shlomo Wilbert SAUNDERS, MD  chlorthalidone  (HYGROTON ) 25 MG tablet TAKE ONE TABLET BY MOUTH EVERY MORNING 06/26/22   Shlomo Wilbert SAUNDERS, MD  DULoxetine  (CYMBALTA ) 60 MG capsule Take 1 capsule (60 mg total) by mouth 2 (two) times daily. 08/01/16   Okey Barnie SAUNDERS, MD  estradiol  (ESTRACE ) 0.1 MG/GM vaginal cream Discard plastic applicator. Insert a blueberry size amount (approximately 1 gram) of cream on fingertip inside vagina at bedtime every night for 1 week then 2 nights per week for long term use. 07/09/22   Gerldine Lauraine BROCKS, FNP  fluticasone  (FLONASE ) 50 MCG/ACT nasal spray Place 1 spray into both nostrils daily as needed for allergies.     [provider]  montelukast  (SINGULAIR ) 10 MG tablet Take 10 mg by mouth daily. 03/03/22   [provider]  Naphazoline-Pheniramine (OPCON-A  OP) Place 1 drop into both eyes 3 (three) times daily as needed (dry/irritated eyes.).    [provider]  NON FORMULARY Pt uses a cpap nightly    [provider]  olmesartan  (BENICAR ) 40 MG tablet TAKE ONE TABLET BY MOUTH ONCE DAILY  06/26/22   Turner, Traci R, MD  Probiotic Product (PROBIOTIC DAILY PO) Take 1 capsule by mouth daily.     [provider]  sennosides-docusate sodium  (SENOKOT-S) 8.6-50 MG tablet Take 2 tablets by mouth daily. 07/09/21   McBane, Caroline N, PA-C  simvastatin  (ZOCOR ) 20 MG tablet Take 20 mg  by mouth at bedtime.    [provider]  VITAMIN D  PO Take 1 tablet by mouth daily.    [provider]     All other systems have been reviewed and were otherwise negative with the exception of those mentioned in the HPI and as above.  Physical Exam: There were no vitals filed for this visit.  There is no height or weight on file to calculate BMI.  General: Alert, no acute distress Cardiovascular: No pedal edema Respiratory: No cyanosis, no use of accessory musculature Skin: No lesions in the area of chief complaint Neurologic: Sensation intact distally Psychiatric: Patient is competent for consent with normal mood and affect Lymphatic: No axillary or cervical lymphadenopathy   Assessment/Plan: DISC HERNIATION AND STENOSIS AT L2/3, THE LEVEL ABOVE THE PATIENT'S FUSION, POSSIBLE NONUNION AT L3/4 Plan for Procedure(s): LEFT-SIDED LUMBAR 2 - LUMBAR 3 TRANSFORAMINAL LUMBAR INTERBODY FUSION AND DECOMPRESSION WITH INSTRUMENTATION AND ALLOGRAFT, POSSIBLE REVISION LUMBAR 3- LUMBAR 4 POSTERIOR SPINAL FUSION WITH INSTRUMENTATION AND ALLOGRAFT   Oneil LITTIE Priestly, MD 02/19/2023 8:00 AM

## 2023-02-20 ENCOUNTER — Other Ambulatory Visit (HOSPITAL_COMMUNITY): Payer: Self-pay

## 2023-02-20 DIAGNOSIS — M5116 Intervertebral disc disorders with radiculopathy, lumbar region: Secondary | ICD-10-CM | POA: Diagnosis not present

## 2023-02-20 DIAGNOSIS — Z79899 Other long term (current) drug therapy: Secondary | ICD-10-CM | POA: Diagnosis not present

## 2023-02-20 DIAGNOSIS — I10 Essential (primary) hypertension: Secondary | ICD-10-CM | POA: Diagnosis not present

## 2023-02-20 DIAGNOSIS — M48061 Spinal stenosis, lumbar region without neurogenic claudication: Secondary | ICD-10-CM | POA: Diagnosis not present

## 2023-02-20 DIAGNOSIS — Z96652 Presence of left artificial knee joint: Secondary | ICD-10-CM | POA: Diagnosis not present

## 2023-02-20 MED ORDER — METHOCARBAMOL 500 MG PO TABS
500.0000 mg | ORAL_TABLET | Freq: Four times a day (QID) | ORAL | 2 refills | Status: DC | PRN
  Filled 2023-02-20: qty 30, 4d supply, fill #0

## 2023-02-20 MED ORDER — OXYCODONE-ACETAMINOPHEN 5-325 MG PO TABS
1.0000 | ORAL_TABLET | ORAL | 0 refills | Status: DC | PRN
  Filled 2023-02-20: qty 30, 5d supply, fill #0

## 2023-02-20 MED FILL — Thrombin (Recombinant) For Soln 20000 Unit: CUTANEOUS | Qty: 1 | Status: AC

## 2023-02-20 NOTE — Progress Notes (Signed)
    Patient doing well  Patient denies leg pain   Physical Exam: Vitals:   02/19/23 2340 02/20/23 0314  BP:  (!) 160/70  Pulse: 85 81  Resp: 18 18  Temp:  97.8 F (36.6 C)  SpO2: 94% 97%    Dressing in place NVI  POD #1 s/p lumbar decompression and fusion, doing well  - up with PT/OT, encourage ambulation - Percocet for pain, Robaxin  for muscle spasms - d/c home today with f/u in 2 weeks

## 2023-02-20 NOTE — Plan of Care (Signed)

## 2023-02-20 NOTE — Anesthesia Postprocedure Evaluation (Signed)
 Anesthesia Post Note  Patient: Soderquist-Anna Jolicoeur  Procedure(s) Performed: LEFT-SIDED LUMBAR 2 - LUMBAR 3 TRANSFORAMINAL LUMBAR INTERBODY FUSION AND DECOMPRESSION WITH INSTRUMENTATION AND ALLOGRAFT (Left)     Patient location during evaluation: PACU Anesthesia Type: General Level of consciousness: awake and alert Pain management: pain level controlled Vital Signs Assessment: post-procedure vital signs reviewed and stable Respiratory status: spontaneous breathing, nonlabored ventilation, respiratory function stable and patient connected to nasal cannula oxygen  Cardiovascular status: blood pressure returned to baseline and stable Postop Assessment: no apparent nausea or vomiting Anesthetic complications: no   No notable events documented.  Last Vitals:  Vitals:   02/20/23 0314 02/20/23 0749  BP: (!) 160/70 137/73  Pulse: 81 85  Resp: 18 20  Temp: 36.6 C 36.9 C  SpO2: 97% 96%    Last Pain:  Vitals:   02/20/23 0749  TempSrc: Oral  PainSc:                  Lorin Gawron S

## 2023-02-20 NOTE — Progress Notes (Signed)
 Patient alert and oriented, voiding adequately, skin clean, dry and intact without evidence of skin break down, or symptoms of complications - no redness or edema noted, only slight tenderness at site.  Patient states pain is manageable at time of discharge. Room was checked and accounted for all patient's belongings; discharge instructions concerning her medications, incision care, follow up appointment and when to call the doctor as needed were all discussed with patient by RN and she expressed understanding on the instructions given.

## 2023-02-20 NOTE — Evaluation (Signed)
 Occupational Therapy Evaluation Patient Details Name: [name] MRN: [id number] DOB: [birth year] Today's Date: 02/20/2023   History of Present Illness Pt is a 73 year old woman admitted on 02/19/23 for L 2-3 decompression, L TLIF, R PLIF. PMH: sleep apnea, anxiety, depression, arthritis, L TKA, fibromyalgia, HTN, HLD, obesity.   Clinical Impression   Pt was independent prior to admission. She lives with her supportive husband, but prefers not to rely on him for ADLs as he already does much of the household chores. Educated pt in back precautions related to ADLs and instructed in use of AE for LB bathing and dressing. Recommended seated showering, pt has a shower seat, and for husband to supervise tub transfers initially. Educated in IADLs to avoid. Pt verbalized and/or demonstrated understanding. No further OT needs.       If plan is discharge home, recommend the following: Assistance with cooking/housework;Assist for transportation;A lot of help with bathing/dressing/bathroom    Functional Status Assessment  Patient has had a recent decline in their functional status and demonstrates the ability to make significant improvements in function in a reasonable and predictable amount of time.  Equipment Recommendations  None recommended by OT    Recommendations for Other Services       Precautions / Restrictions Precautions Precautions: Back Precaution Booklet Issued: Yes (comment) Required Braces or Orthoses: Spinal Brace Spinal Brace: Thoracolumbosacral orthotic;Applied in sitting position      Mobility Bed Mobility Overal bed mobility: Modified Independent             General bed mobility comments: using log roll technique, no rail    Transfers Overall transfer level: Modified independent Equipment used: None                      Balance                                           ADL either performed or assessed with clinical judgement    ADL Overall ADL's : Needs assistance/impaired Eating/Feeding: Independent   Grooming: Standing;Supervision/safety;Wash/dry hands;Oral care Grooming Details (indicate cue type and reason): educated in 2 cup method for oral care and use of washcloth on face Upper Body Bathing: Minimal assistance;Sitting   Lower Body Bathing: Sitting/lateral leans;Sit to/from stand;Moderate assistance Lower Body Bathing Details (indicate cue type and reason): educated in use of long handled bath sponge and reacher Upper Body Dressing : Set up;Sitting   Lower Body Dressing: Sit to/from stand;Moderate assistance Lower Body Dressing Details (indicate cue type and reason): educated in use of reacher and sock aid Toilet Transfer: Supervision/safety;Ambulation;Comfort height toilet   Toileting- Clothing Manipulation and Hygiene: Modified independent Toileting - Clothing Manipulation Details (indicate cue type and reason): educated to avoid twisting with posterior pericare     Functional mobility during ADLs: Supervision/safety General ADL Comments: Educated pt in IADLs to avoid. Pt reports her husband does laundry, cleaning and grocery shopping at baseline.     Vision Ability to See in Adequate Light: 0 Adequate Patient Visual Report: No change from baseline       Perception         Praxis         Pertinent Vitals/Pain Pain Assessment Pain Assessment: Faces Faces Pain Scale: Hurts a little bit Pain Location: incision Pain Descriptors / Indicators: Discomfort Pain Intervention(s): Monitored during session, Premedicated before session  Extremity/Trunk Assessment Upper Extremity Assessment Upper Extremity Assessment: Overall WFL for tasks assessed   Lower Extremity Assessment Lower Extremity Assessment: Defer to PT evaluation   Cervical / Trunk Assessment Cervical / Trunk Assessment: Back Surgery   Communication Communication Communication: No apparent difficulties   Cognition  Arousal: Alert Behavior During Therapy: WFL for tasks assessed/performed Overall Cognitive Status: Within Functional Limits for tasks assessed                                       General Comments       Exercises     Shoulder Instructions      Home Living Family/patient expects to be discharged to:: Private residence Living Arrangements: Spouse/significant other Available Help at Discharge: Family;Available 24 hours/day Type of Home: House Home Access: Stairs to enter [employer] of Steps: 5 Entrance Stairs-Rails: Left Home Layout: Two level;Laundry or work area in basement;Able to live on main level with bedroom/bathroom     Bathroom Shower/Tub: Chief Strategy Officer: Handicapped height     Home Equipment: Information systems manager;Adaptive equipment Adaptive Equipment: Reacher        Prior Functioning/Environment Prior Level of Function : Independent/Modified Independent                        OT Problem List: Pain      OT Treatment/Interventions:      OT Goals(Current goals can be found in the care plan section)    OT Frequency:      Co-evaluation              AM-PAC OT 6 Clicks Daily Activity     Outcome Measure Help from another person eating meals?: None Help from another person taking care of personal grooming?: None Help from another person toileting, which includes using toliet, bedpan, or urinal?: None Help from another person bathing (including washing, rinsing, drying)?: A Little Help from another person to put on and taking off regular upper body clothing?: None Help from another person to put on and taking off regular lower body clothing?: A Little 6 Click Score: 22   End of Session    Activity Tolerance:   Patient left: in bed;with call bell/phone within reach  OT Visit Diagnosis: Pain;Muscle weakness (generalized) (M62.81)                Time: 9152-9079 OT Time Calculation (min): 33  min Charges:  OT General Charges $OT Visit: 1 Visit OT Evaluation $OT Eval Low Complexity: 1 Low OT Treatments $Self Care/Home Management : 8-22 mins  Mliss HERO, OTR/L Acute Rehabilitation Services Office: [phone]   Kennth Mliss Helling 02/20/2023, 9:36 AM

## 2023-02-20 NOTE — Evaluation (Signed)
 Physical Therapy Evaluation  Patient Details Name: [name] MRN: [id number] DOB: 06-May-1950 Today's Date: 02/20/2023  History of Present Illness  Pt is a 73 year old woman admitted on 02/19/23 for L 2-3 decompression, L TLIF, R PLIF. PMH: sleep apnea, anxiety, depression, arthritis, L TKA, fibromyalgia, HTN, HLD, obesity.   Clinical Impression  Pt admitted with above diagnosis. At the time of PT eval, pt was able to demonstrate transfers and ambulation with gross supervision for safety to CGA and no AD. Brace adjusted for optimal fit. Pt was educated on precautions, brace application/wearing schedule, appropriate activity progression, and car transfer. Pt currently with functional limitations due to the deficits listed below (see PT Problem List). Pt will benefit from skilled PT to increase their independence and safety with mobility to allow discharge to the venue listed below.          If plan is discharge home, recommend the following: A little help with walking and/or transfers;A little help with bathing/dressing/bathroom;Assistance with cooking/housework;Assist for transportation;Help with stairs or ramp for entrance   Can travel by private vehicle        Equipment Recommendations None recommended by PT  Recommendations for Other Services       Functional Status Assessment Patient has had a recent decline in their functional status and demonstrates the ability to make significant improvements in function in a reasonable and predictable amount of time.     Precautions / Restrictions Precautions Precautions: Back;Fall Precaution Booklet Issued: Yes (comment) Precaution Comments: Reviewed handout and pt was cued for precautions during functional mobility. Required Braces or Orthoses: Spinal Brace Spinal Brace: Thoracolumbosacral orthotic;Applied in sitting position Restrictions Weight Bearing Restrictions Per Provider Order: No      Mobility  Bed Mobility                General bed mobility comments: Pt was received sitting up EOB    Transfers Overall transfer level: Needs assistance Equipment used: None Transfers: Sit to/from Stand Sit to Stand: Supervision           General transfer comment: VC's for hand placement on seated surface for safety. No assist required but pt apepars guarded and mildly unsteady. Close supervision for safety.    Ambulation/Gait Ambulation/Gait assistance: Contact guard assist Gait Distance (Feet): 300 Feet Assistive device: None Gait Pattern/deviations: Step-through pattern, Decreased stride length, Trunk flexed Gait velocity: Decreased Gait velocity interpretation: <1.8 ft/sec, indicate of risk for recurrent falls   General Gait Details: VC's for improved posture. With longer distance pt appers mildly unsteady and guarded but overall no overt LOB noted.  Stairs Stairs: Yes Stairs assistance: Contact guard assist Stair Management: One rail Right, Step to pattern, Forwards Number of Stairs: 7 General stair comments: VC's for sequencing and general safety.  Wheelchair Mobility     Tilt Bed    Modified Rankin (Stroke Patients Only)       Balance Overall balance assessment: Needs assistance Sitting-balance support: Feet supported, No upper extremity supported Sitting balance-Leahy Scale: Fair     Standing balance support: No upper extremity supported, During functional activity Standing balance-Leahy Scale: Fair                               Pertinent Vitals/Pain Pain Assessment Pain Assessment: Faces Faces Pain Scale: Hurts a little bit Pain Location: incision Pain Descriptors / Indicators: Discomfort Pain Intervention(s): Limited activity within patient's tolerance, Monitored during session, Repositioned  Home Living Family/patient expects to be discharged to:: Private residence Living Arrangements: Spouse/significant other Available Help at Discharge: Family;Available 24  hours/day Type of Home: House Home Access: Stairs to enter Entrance Stairs-Rails: Left Entrance Stairs-Number of Steps: 5   Home Layout: Two level;Laundry or work area in basement;Able to live on main level with [employer]: Information systems manager;Adaptive equipment      Prior Function Prior Level of Function : Independent/Modified Independent                     Extremity/Trunk Assessment   Upper Extremity Assessment Upper Extremity Assessment: Overall WFL for tasks assessed    Lower Extremity Assessment Lower Extremity Assessment: Generalized weakness (Mild; consistent with pre-op diagnosis)    Cervical / Trunk Assessment Cervical / Trunk Assessment: Back Surgery  Communication   Communication Communication: No apparent difficulties  Cognition Arousal: Alert Behavior During Therapy: WFL for tasks assessed/performed Overall Cognitive Status: Within Functional Limits for tasks assessed                                          General Comments      Exercises     Assessment/Plan    PT Assessment Patient needs continued PT services  PT Problem List Decreased strength;Decreased activity tolerance;Decreased balance;Decreased mobility;Decreased knowledge of use of DME;Decreased safety awareness;Decreased knowledge of precautions;Pain       PT Treatment Interventions DME instruction;Gait training;Stair training;Functional mobility training;Therapeutic activities;Therapeutic exercise;Balance training;Patient/family education    PT Goals (Current goals can be found in the Care Plan section)  Acute Rehab PT Goals Patient Stated Goal: Home today PT Goal Formulation: With patient Time For Goal Achievement: 02/27/23 Potential to Achieve Goals: Good    Frequency Min 1X/week     Co-evaluation               AM-PAC PT 6 Clicks Mobility  Outcome Measure Help needed turning from your back to your side while in a flat bed without  using bedrails?: A Little Help needed moving from lying on your back to sitting on the side of a flat bed without using bedrails?: A Little Help needed moving to and from a bed to a chair (including a wheelchair)?: A Little Help needed standing up from a chair using your arms (e.g., wheelchair or bedside chair)?: A Little Help needed to walk in hospital room?: A Little Help needed climbing 3-5 steps with a railing? : A Little 6 Click Score: 18    End of Session Equipment Utilized During Treatment: Gait belt;Back brace Activity Tolerance: Patient tolerated treatment well Patient left: in chair;with call bell/phone within reach Nurse Communication: Mobility status PT Visit Diagnosis: Unsteadiness on feet (R26.81);Pain Pain - part of body:  (back)    Time: 9077-9054 PT Time Calculation (min) (ACUTE ONLY): 23 min   Charges:   PT Evaluation $PT Eval Low Complexity: 1 Low PT Treatments $Gait Training: 8-22 mins PT General Charges $$ ACUTE PT VISIT: 1 Visit         Leita Sable, PT, DPT Acute Rehabilitation Services Secure Chat Preferred Office: [phone]   Leita JONETTA Sable 02/20/2023, 10:27 AM

## 2023-02-23 ENCOUNTER — Encounter (HOSPITAL_COMMUNITY): Payer: Self-pay | Admitting: Orthopedic Surgery

## 2023-02-26 NOTE — Discharge Summary (Signed)
 Patient ID: [name] MRN: [id number] DOB/AGE: [birth year] 73 y.o.  Admit date: 02/19/2023 Discharge date: 02/20/2023  Admission Diagnoses:  Principal Problem:   Radiculopathy, lumbar region   Discharge Diagnoses:  Same  Past Medical History:  Diagnosis Date   Anxiety    Arthritis    hands, feet   Back pain    chronic   Blurred vision    hx of   Cataracts, bilateral    MD just watching right now - pt had surgery to remove   Complication of anesthesia    pt states at preop after back surgery she had to sit up all nite ? related to sleep apnea pt not  sure   Cystitides, interstitial, chronic    Depression    Fibromyalgia    GERD (gastroesophageal reflux disease)    Gout    feet - no current problem   Hearing loss    bilateral - no hearing aids   High cholesterol    Hypertension    Interstitial cystitis    hx of   Neuropathy of both feet    Pneumonia    Seasonal allergies    Shortness of breath    with exertion   Sleep apnea    Uses CPAP nightly but pt has appliance that she uses with travel.   SVD (spontaneous vaginal delivery)    x 3   Urinary disorder    bladder is in sling per pt   Urinary tract infection    Vertigo    hx of   Wears glasses    Yeast infection    hx of multiple yeast infections per pt    Surgeries: Procedure(s): LEFT-SIDED LUMBAR 2 - LUMBAR 3 TRANSFORAMINAL LUMBAR INTERBODY FUSION AND DECOMPRESSION WITH INSTRUMENTATION AND ALLOGRAFT on 02/19/2023   Consultants: None  Discharged Condition: Improved  Hospital Course: [name] is an 73 y.o. female who was admitted 02/19/2023 for operative treatment of Radiculopathy, lumbar region. Patient has severe unremitting pain that affects sleep, daily activities, and work/hobbies. After pre-op clearance the patient was taken to the operating room on 02/19/2023 and underwent  Procedure(s): LEFT-SIDED LUMBAR 2 - LUMBAR 3 TRANSFORAMINAL LUMBAR INTERBODY FUSION AND  DECOMPRESSION WITH INSTRUMENTATION AND ALLOGRAFT.    Patient was given perioperative antibiotics:  Anti-infectives (From admission, onward)    Start     Dose/Rate Route Frequency Ordered Stop   02/19/23 2000  ceFAZolin  (ANCEF ) IVPB 2g/100 mL premix        2 g 200 mL/hr over 30 Minutes Intravenous Every 8 hours 02/19/23 1640 02/20/23 0337   02/19/23 1045  ceFAZolin  (ANCEF ) IVPB 2g/100 mL premix        2 g 200 mL/hr over 30 Minutes Intravenous On call to O.R. 02/19/23 0903 02/19/23 1147   02/19/23 0806  ceFAZolin  (ANCEF ) 2-4 GM/100ML-% IVPB       Note to Pharmacy: Carolynn Friday E: cabinet override      02/19/23 0806 02/19/23 1231        Patient was given sequential compression devices, early ambulation to prevent DVT.  Patient benefited maximally from hospital stay and there were no complications.    Recent vital signs: BP 137/73 (BP Location: Right Arm)   Pulse 85   Temp 98.5 F (36.9 C) (Oral)   Resp 20   Ht 5' 5 (1.651 m)   Wt 97.5 kg   LMP  (LMP Unknown)   SpO2 96%   BMI 35.78 kg/m    Discharge  Medications:   Allergies as of 02/20/2023   No Known Allergies      Medication List     STOP taking these medications    HYDROcodone -acetaminophen  5-325 MG tablet Commonly known as: NORCO/VICODIN       TAKE these medications    acetaminophen  500 MG tablet Commonly known as: TYLENOL  Take 500 mg by mouth every 6 (six) hours as needed for moderate pain.   albuterol  108 (90 Base) MCG/ACT inhaler Commonly known as: VENTOLIN  HFA Inhale 2 puffs into the lungs every 4 (four) hours as needed for wheezing.   ALPRAZolam  0.5 MG tablet Commonly known as: XANAX  Take 0.25-0.5 mg by mouth 2 (two) times daily. Pt sometimes take 1 tablet   amLODipine  5 MG tablet Commonly known as: NORVASC  Take 5 mg by mouth daily.   CAMPHOR-MENTHOL  EX Apply 1 application topically 3 (three) times daily as needed (for pain).   carvedilol  25 MG tablet Commonly known as: COREG  TAKE ONE  TABLET BY MOUTH TWICE DAILY   chlorthalidone  25 MG tablet Commonly known as: HYGROTON  TAKE ONE TABLET BY MOUTH EVERY MORNING   cyclobenzaprine  5 MG tablet Commonly known as: FLEXERIL  Take 5-10 mg by mouth at bedtime as needed for muscle spasms.   DULoxetine  60 MG capsule Commonly known as: CYMBALTA  Take 1 capsule (60 mg total) by mouth 2 (two) times daily.   estradiol  0.1 MG/GM vaginal cream Commonly known as: ESTRACE  Discard plastic applicator. Insert a blueberry size amount (approximately 1 gram) of cream on fingertip inside vagina at bedtime every night for 1 week then 2 nights per week for long term use.   fluticasone  50 MCG/ACT nasal spray Commonly known as: FLONASE  Place 1 spray into both nostrils daily as needed for allergies.   gabapentin  300 MG capsule Commonly known as: NEURONTIN  Take 300 mg by mouth as needed (For back pain).   methocarbamol  500 MG tablet Commonly known as: ROBAXIN  Take 500 mg by mouth 4 (four) times daily. What changed: Another medication with the same name was added. Make sure you understand how and when to take each.   methocarbamol  500 MG tablet Commonly known as: ROBAXIN  Take 1-2 tablets (500-1,000 mg total) by mouth every 6 (six) hours as needed for muscle spasms. What changed: You were already taking a medication with the same name, and this prescription was added. Make sure you understand how and when to take each.   montelukast  10 MG tablet Commonly known as: SINGULAIR  Take 10 mg by mouth daily.   NON FORMULARY Pt uses a cpap nightly   olmesartan  40 MG tablet Commonly known as: BENICAR  TAKE ONE TABLET BY MOUTH ONCE DAILY   OPCON-A  OP Place 1 drop into both eyes 3 (three) times daily as needed (dry/irritated eyes.).   oxyCODONE -acetaminophen  5-325 MG tablet Commonly known as: PERCOCET/ROXICET Take 1-2 tablets by mouth every 4 (four) hours as needed for severe pain (pain score 7-10).   PROBIOTIC DAILY PO Take 1 capsule by mouth  daily.   sennosides-docusate sodium  8.6-50 MG tablet Commonly known as: SENOKOT-S Take 2 tablets by mouth daily.   simvastatin  20 MG tablet Commonly known as: ZOCOR  Take 20 mg by mouth at bedtime.   tiZANidine 4 MG tablet Commonly known as: ZANAFLEX Take 4 mg by mouth at bedtime.   VITAMIN D  PO Take 1 tablet by mouth daily.        Diagnostic Studies: DG Lumbar Spine 2-3 Views Result Date: 02/19/2023 CLINICAL DATA:  Elective surgery. EXAM: LUMBAR SPINE -  2-3 VIEW COMPARISON:  Preoperative CT. FINDINGS: 2 fluoroscopic spot views of the lumbar spine submitted from the operating room. Previous L3 through L5 fusion hardware. Hardware is extended to the L2 level with new L2-L3 interbody spacer. Fluoroscopy time 22 seconds. Dose 14.34 mGy. IMPRESSION: Intraoperative fluoroscopy during lumbar fusion. Electronically Signed   By: Andrea Gasman M.D.   On: 02/19/2023 15:58   DG Lumbar Spine 1 View Result Date: 02/19/2023 CLINICAL DATA:  Elective surgery. EXAM: LUMBAR SPINE - 1 VIEW COMPARISON:  Preoperative CT 01/22/2023 FINDINGS: Cross-table view of the lumbar spine submitted from the operating room. Previous L3 through L5 fusion hardware with interbody spacers. Surgical instruments localize posteriorly at the L2 and L3 levels. IMPRESSION: Cross-table view of the lumbar spine for surgical localization. Electronically Signed   By: Andrea Gasman M.D.   On: 02/19/2023 15:51   DG C-Arm 1-60 Min-No Report Result Date: 02/19/2023 Fluoroscopy was utilized by the requesting physician.  No radiographic interpretation.   DG C-Arm 1-60 Min-No Report Result Date: 02/19/2023 Fluoroscopy was utilized by the requesting physician.  No radiographic interpretation.   DG C-Arm 1-60 Min-No Report Result Date: 02/19/2023 Fluoroscopy was utilized by the requesting physician.  No radiographic interpretation.    Disposition: Discharge disposition: 01-Home or Self Care        POD #1 s/p lumbar  decompression and fusion, doing well   - up with PT/OT, encourage ambulation - Percocet for pain, Robaxin  for muscle spasms -Scripts for pain sent to pharmacy electronically  -D/C instructions sheet printed and in chart -D/C today  -F/U in office 2 weeks   Signed: Ileana JINNY Clara 02/26/2023, 10:43 AM

## 2023-03-24 DIAGNOSIS — M5416 Radiculopathy, lumbar region: Secondary | ICD-10-CM | POA: Diagnosis not present

## 2023-04-20 DIAGNOSIS — M545 Low back pain, unspecified: Secondary | ICD-10-CM | POA: Diagnosis not present

## 2023-04-20 DIAGNOSIS — M6281 Muscle weakness (generalized): Secondary | ICD-10-CM | POA: Diagnosis not present

## 2023-04-20 DIAGNOSIS — Z4789 Encounter for other orthopedic aftercare: Secondary | ICD-10-CM | POA: Diagnosis not present

## 2023-04-23 DIAGNOSIS — M6281 Muscle weakness (generalized): Secondary | ICD-10-CM | POA: Diagnosis not present

## 2023-04-23 DIAGNOSIS — M545 Low back pain, unspecified: Secondary | ICD-10-CM | POA: Diagnosis not present

## 2023-04-23 DIAGNOSIS — Z4789 Encounter for other orthopedic aftercare: Secondary | ICD-10-CM | POA: Diagnosis not present

## 2023-04-27 DIAGNOSIS — Z4789 Encounter for other orthopedic aftercare: Secondary | ICD-10-CM | POA: Diagnosis not present

## 2023-04-27 DIAGNOSIS — M6281 Muscle weakness (generalized): Secondary | ICD-10-CM | POA: Diagnosis not present

## 2023-04-27 DIAGNOSIS — M545 Low back pain, unspecified: Secondary | ICD-10-CM | POA: Diagnosis not present

## 2023-04-28 DIAGNOSIS — Z1231 Encounter for screening mammogram for malignant neoplasm of breast: Secondary | ICD-10-CM | POA: Diagnosis not present

## 2023-04-30 DIAGNOSIS — B372 Candidiasis of skin and nail: Secondary | ICD-10-CM | POA: Diagnosis not present

## 2023-04-30 DIAGNOSIS — M545 Low back pain, unspecified: Secondary | ICD-10-CM | POA: Diagnosis not present

## 2023-04-30 DIAGNOSIS — M6281 Muscle weakness (generalized): Secondary | ICD-10-CM | POA: Diagnosis not present

## 2023-04-30 DIAGNOSIS — Z4789 Encounter for other orthopedic aftercare: Secondary | ICD-10-CM | POA: Diagnosis not present

## 2023-05-05 DIAGNOSIS — M6281 Muscle weakness (generalized): Secondary | ICD-10-CM | POA: Diagnosis not present

## 2023-05-05 DIAGNOSIS — M545 Low back pain, unspecified: Secondary | ICD-10-CM | POA: Diagnosis not present

## 2023-05-05 DIAGNOSIS — Z4789 Encounter for other orthopedic aftercare: Secondary | ICD-10-CM | POA: Diagnosis not present

## 2023-05-07 DIAGNOSIS — M545 Low back pain, unspecified: Secondary | ICD-10-CM | POA: Diagnosis not present

## 2023-05-07 DIAGNOSIS — Z4789 Encounter for other orthopedic aftercare: Secondary | ICD-10-CM | POA: Diagnosis not present

## 2023-05-07 DIAGNOSIS — M6281 Muscle weakness (generalized): Secondary | ICD-10-CM | POA: Diagnosis not present

## 2023-05-11 DIAGNOSIS — M5416 Radiculopathy, lumbar region: Secondary | ICD-10-CM | POA: Diagnosis not present

## 2023-05-12 DIAGNOSIS — Z4789 Encounter for other orthopedic aftercare: Secondary | ICD-10-CM | POA: Diagnosis not present

## 2023-05-12 DIAGNOSIS — M6281 Muscle weakness (generalized): Secondary | ICD-10-CM | POA: Diagnosis not present

## 2023-05-12 DIAGNOSIS — M545 Low back pain, unspecified: Secondary | ICD-10-CM | POA: Diagnosis not present

## 2023-05-13 ENCOUNTER — Other Ambulatory Visit: Payer: Self-pay | Admitting: Cardiology

## 2023-05-14 DIAGNOSIS — Z4789 Encounter for other orthopedic aftercare: Secondary | ICD-10-CM | POA: Diagnosis not present

## 2023-05-14 DIAGNOSIS — M6281 Muscle weakness (generalized): Secondary | ICD-10-CM | POA: Diagnosis not present

## 2023-05-14 DIAGNOSIS — M545 Low back pain, unspecified: Secondary | ICD-10-CM | POA: Diagnosis not present

## 2023-05-19 DIAGNOSIS — R3915 Urgency of urination: Secondary | ICD-10-CM | POA: Diagnosis not present

## 2023-05-19 DIAGNOSIS — M6281 Muscle weakness (generalized): Secondary | ICD-10-CM | POA: Diagnosis not present

## 2023-05-19 DIAGNOSIS — Z4789 Encounter for other orthopedic aftercare: Secondary | ICD-10-CM | POA: Diagnosis not present

## 2023-05-19 DIAGNOSIS — M545 Low back pain, unspecified: Secondary | ICD-10-CM | POA: Diagnosis not present

## 2023-05-21 DIAGNOSIS — M6281 Muscle weakness (generalized): Secondary | ICD-10-CM | POA: Diagnosis not present

## 2023-05-21 DIAGNOSIS — M545 Low back pain, unspecified: Secondary | ICD-10-CM | POA: Diagnosis not present

## 2023-05-21 DIAGNOSIS — Z4789 Encounter for other orthopedic aftercare: Secondary | ICD-10-CM | POA: Diagnosis not present

## 2023-05-26 DIAGNOSIS — M545 Low back pain, unspecified: Secondary | ICD-10-CM | POA: Diagnosis not present

## 2023-05-26 DIAGNOSIS — Z4789 Encounter for other orthopedic aftercare: Secondary | ICD-10-CM | POA: Diagnosis not present

## 2023-05-26 DIAGNOSIS — M6281 Muscle weakness (generalized): Secondary | ICD-10-CM | POA: Diagnosis not present

## 2023-06-12 ENCOUNTER — Other Ambulatory Visit: Payer: Self-pay | Admitting: Cardiology

## 2023-06-15 ENCOUNTER — Other Ambulatory Visit: Payer: Self-pay | Admitting: Cardiology

## 2023-06-25 ENCOUNTER — Other Ambulatory Visit: Payer: Self-pay | Admitting: Cardiology

## 2023-07-06 DIAGNOSIS — L570 Actinic keratosis: Secondary | ICD-10-CM | POA: Diagnosis not present

## 2023-07-06 DIAGNOSIS — L573 Poikiloderma of Civatte: Secondary | ICD-10-CM | POA: Diagnosis not present

## 2023-07-13 ENCOUNTER — Other Ambulatory Visit: Payer: Self-pay | Admitting: Cardiology

## 2023-07-20 DIAGNOSIS — R944 Abnormal results of kidney function studies: Secondary | ICD-10-CM | POA: Diagnosis not present

## 2023-07-20 DIAGNOSIS — R739 Hyperglycemia, unspecified: Secondary | ICD-10-CM | POA: Diagnosis not present

## 2023-07-20 DIAGNOSIS — E7849 Other hyperlipidemia: Secondary | ICD-10-CM | POA: Diagnosis not present

## 2023-07-20 DIAGNOSIS — E538 Deficiency of other specified B group vitamins: Secondary | ICD-10-CM | POA: Diagnosis not present

## 2023-07-20 DIAGNOSIS — E559 Vitamin D deficiency, unspecified: Secondary | ICD-10-CM | POA: Diagnosis not present

## 2023-07-20 DIAGNOSIS — R5383 Other fatigue: Secondary | ICD-10-CM | POA: Diagnosis not present

## 2023-07-20 DIAGNOSIS — D559 Anemia due to enzyme disorder, unspecified: Secondary | ICD-10-CM | POA: Diagnosis not present

## 2023-07-27 DIAGNOSIS — E782 Mixed hyperlipidemia: Secondary | ICD-10-CM | POA: Diagnosis not present

## 2023-07-27 DIAGNOSIS — G4733 Obstructive sleep apnea (adult) (pediatric): Secondary | ICD-10-CM | POA: Diagnosis not present

## 2023-07-27 DIAGNOSIS — Z0001 Encounter for general adult medical examination with abnormal findings: Secondary | ICD-10-CM | POA: Diagnosis not present

## 2023-07-27 DIAGNOSIS — E871 Hypo-osmolality and hyponatremia: Secondary | ICD-10-CM | POA: Diagnosis not present

## 2023-07-27 DIAGNOSIS — Z23 Encounter for immunization: Secondary | ICD-10-CM | POA: Diagnosis not present

## 2023-07-27 DIAGNOSIS — M810 Age-related osteoporosis without current pathological fracture: Secondary | ICD-10-CM | POA: Diagnosis not present

## 2023-08-06 ENCOUNTER — Telehealth: Payer: Self-pay | Admitting: Student

## 2023-08-06 ENCOUNTER — Encounter: Payer: Self-pay | Admitting: Cardiology

## 2023-08-06 NOTE — Telephone Encounter (Signed)
 Error

## 2023-08-06 NOTE — Telephone Encounter (Signed)
 Returned call to pt. No answer. Left msg to call back.

## 2023-08-06 NOTE — Telephone Encounter (Signed)
 Pt.notified

## 2023-08-06 NOTE — Telephone Encounter (Signed)
 Pt reports that over the last 3 days she has been weak, SOB on exertion, sleepy, and swelling in the feet. Pt states that she has been wheezing. Current weight on today 205 lbs yesterday 206 lbs and on the day before that it was 208 lbs. Takes Chlorthalidone  25 mg daily Current blood pressure is 150/80 HR 64. Please advise.

## 2023-08-06 NOTE — Telephone Encounter (Signed)
 Pt c/o swelling/edema: STAT if pt has developed SOB within 24 hours  If swelling, where is the swelling located? Feet   How much weight have you gained and in what time span? 4 lbs in about 2 weeks   Have you gained 2 pounds in a day or 5 pounds in a week? 4 lbs in about 2 weeks   Do you have a log of your daily weights (if so, list)?  7/6 - 204  7/24 - 208   Are you currently taking a fluid pill? Yes    Are you currently SOB? Not currently, but has had sob   Have you traveled recently in a car or plane for an extended period of time?  No  Per last ov, told to f/u as needed

## 2023-08-06 NOTE — Telephone Encounter (Signed)
   Her last office visit with us  was in 04/2022 and Coronary CT and echocardiogram at that time had been reassuring. If her weight is already declining and the swelling is only isolated along her feet, I would continue with Chlorthalidone  as she has been doing. Would recommend elevating her legs when able and limiting sodium intake as this can lead to worsening swelling.  In regards to her shortness of breath and wheezing, I would recommend evaluation by her PCP or Urgent Care to assess initially. They can listen to her lungs and perhaps do a CXR to help determine the etiology of her symptoms. There is an opening next Wednesday at 2:30 and can schedule a follow-up appt if she would like but I would recommend initial evaluation this week as discussed above.   Signed, Laymon CHRISTELLA Qua, PA-C 08/06/2023, 11:16 AM Pager: [phone]

## 2023-08-12 ENCOUNTER — Other Ambulatory Visit: Payer: Self-pay | Admitting: Cardiology

## 2023-08-12 ENCOUNTER — Ambulatory Visit: Admitting: Student

## 2023-08-12 NOTE — Progress Notes (Unsigned)
 Cardiology Office Note    Date:  08/13/2023  ID:  [name], DOB May 03, 1950, MRN [id number] Cardiologist: Wilbert Bihari, MD Cardiology APP:  Johnson Laymon HERO, PA-C { :  History of Present Illness:    [name] is a 73 y.o. female  with past medical history of chest pain (prior abnormal NST with Coronary CT in 11/2019 showing calcium score of 0), HTN, HLD, OSA, anxiety and depression who presents to the office today for evaluation of shortness of breath and lower extremity edema.  She was last examined by myself in 04/2022 and reported having dyspnea on exertion since previously being diagnosed with COVID-19 in 11/2021 but was trying to walk a mile a day for exercise. Given her recent Coronary CTA in 2021 showing a coronary calcium score of 0, further ischemic evaluation was not pursued. Given her intermittent edema, an echocardiogram was obtained and this showed a preserved EF of 70 to 75% with grade 1 diastolic dysfunction.  RV function was normal and she did not have any significant valve abnormalities. She was to follow-up with Cardiology as needed going forward.  She called the office earlier this month reporting worsening shortness of breath, wheezing and swelling along her feet but her weight had declined by 3 pounds. BP was elevated at 150/80 on most recent check. She was informed to follow-up with her PCP or Urgent Care initially.   In talking with the patient today, she reports a variety of symptoms over the past few months.  The most notable has been worsening dyspnea on exertion and fatigue which she notices with minimal activity around her home. Says that she does not have the energy that she previously had to cook and has noticed this with walking up steps as well. She feels like she has been more distended along her abdomen and has lower extremity swelling. This was worse while at the beach and she reports an acute weight gain of over 8 pounds during that  timeframe. Her weight has since declined to her baseline. She denies any exertional chest pain or palpitations. In reviewing her dietary intake, she has been consuming Himalayan salt and also uses several supplements which appear to contain sodium by review of the nutrition labels.  Studies Reviewed:   EKG: EKG is ordered today and demonstrates:   EKG Interpretation Date/Time:  Thursday August 13 2023 09:47:33 EDT Ventricular Rate:  67 PR Interval:  214 QRS Duration:  86 QT Interval:  384 QTC Calculation: 405 R Axis:   40  Text Interpretation: Sinus rhythm with 1st degree A-V block  TWI along AVL as noted on prior tracings. Confirmed by Johnson Laymon ([id number]) on 08/13/2023 9:49:57 AM       Coronary CTA: 11/2019 IMPRESSION: 1. Coronary calcium score of 0. This was 0 percentile for age and sex matched control.   2.  Normal coronary origin with right dominance.   3.  No evidence of CAD.  CAD RADS 0.   4.  Consider no atherosclerotic causes of chest pain.    Echocardiogram: 05/2022 IMPRESSIONS     1. Left ventricular ejection fraction, by estimation, is 70 to 75%. The  left ventricle has hyperdynamic function. The left ventricle has no  regional wall motion abnormalities. Left ventricular diastolic parameters  are consistent with Grade I diastolic  dysfunction (impaired relaxation). The average left ventricular global  longitudinal strain is -26.9 %. The global longitudinal strain is normal.   2. Right ventricular systolic function is normal.  The right ventricular  size is normal. There is normal pulmonary artery systolic pressure.   3. The mitral valve is grossly normal. No evidence of mitral valve  regurgitation. No evidence of mitral stenosis.   4. The aortic valve is tricuspid. There is mild calcification of the  aortic valve. Aortic valve regurgitation is not visualized. No aortic  stenosis is present.   5. The inferior vena cava is normal in size with greater than  50%  respiratory variability, suggesting right atrial pressure of 3 mmHg.   Comparison(s): No prior Echocardiogram.    Physical Exam:   VS:  BP 110/62   Pulse 68   Ht 5' 5.5 (1.664 m)   Wt 201 lb (91.2 kg)   LMP  (LMP Unknown)   SpO2 92%   BMI 32.94 kg/m    Wt Readings from Last 3 Encounters:  08/13/23 201 lb (91.2 kg)  02/19/23 215 lb (97.5 kg)  02/13/23 211 lb 9.6 oz (96 kg)     GEN: Well nourished, well developed female appearing in no acute distress NECK: No JVD; No carotid bruits CARDIAC: RRR, no murmurs, rubs, gallops RESPIRATORY:  Clear to auscultation without rales, wheezing or rhonchi  ABDOMEN: Appears non-distended. No obvious abdominal masses. EXTREMITIES: No clubbing or cyanosis. No pitting edema.  Distal pedal pulses are 2+ bilaterally.   Assessment and Plan:   1. DOE (dyspnea on exertion) - She reports worsening dyspnea on exertion and fatigue as outlined above over the past 2 months. Prior cardiac workup was reassuring as outlined above with Coronary CT in 2021 showing no evidence of CAD and echocardiogram last year showed a preserved EF. She is worried about heart failure given her family history of this in her mother. - Will obtain a follow-up echocardiogram for reassessment of her EF. She does report her PCP checked labs within the past month and we will request a copy of these. Would ensure that a CBC, BMET, TSH and BNP have been obtained. If not, would order. We reviewed the option of PRN Lasix but will hold off for now given improvement in symptoms with dietary changes. Also reviewed the importance of reducing her sodium intake.   2. History of chest pain - Coronary CTA in 11/2019 showed a coronary calcium score of 0 with no evidence of CAD. Denies any recent chest pain. Will plan for a follow-up echocardiogram as discussed above.  - Continue with risk factor modification. Remains on Coreg  25 mg twice daily and Simvastatin  20 mg daily.  3. Essential  hypertension - BP is at 110/62 during today's visit. Continue current medical therapy for now with Amlodipine  10 mg daily, Coreg  25 mg twice daily, Chlorthalidone  25 mg daily and Olmesartan  40 mg daily. If BP improves with dietary changes, would initially focus on trying to reduce Coreg  as she feels like this is causing worsening fatigue.  4. Hyperlipidemia LDL goal <70 - Followed by her PCP. We will request a copy of most recent labs. Remains on Simvastatin  20 mg daily.  Signed, Laymon CHRISTELLA Qua, PA-C

## 2023-08-13 ENCOUNTER — Encounter: Payer: Self-pay | Admitting: *Deleted

## 2023-08-13 ENCOUNTER — Encounter: Payer: Self-pay | Admitting: Student

## 2023-08-13 ENCOUNTER — Encounter: Payer: Self-pay | Admitting: Family Medicine

## 2023-08-13 ENCOUNTER — Ambulatory Visit: Payer: Self-pay | Admitting: Student

## 2023-08-13 ENCOUNTER — Ambulatory Visit: Attending: Student | Admitting: Student

## 2023-08-13 VITALS — BP 110/62 | HR 68 | Ht 65.5 in | Wt 201.0 lb

## 2023-08-13 DIAGNOSIS — Z87898 Personal history of other specified conditions: Secondary | ICD-10-CM

## 2023-08-13 DIAGNOSIS — I10 Essential (primary) hypertension: Secondary | ICD-10-CM | POA: Diagnosis not present

## 2023-08-13 DIAGNOSIS — E785 Hyperlipidemia, unspecified: Secondary | ICD-10-CM

## 2023-08-13 DIAGNOSIS — R0609 Other forms of dyspnea: Secondary | ICD-10-CM | POA: Diagnosis not present

## 2023-08-13 NOTE — Patient Instructions (Signed)
 Medication Instructions:  Your physician recommends that you continue on your current medications as directed. Please refer to the Current Medication list given to you today.  *If you need a refill on your cardiac medications before your next appointment, please call your pharmacy*  Lab Work: NONE   If you have labs (blood work) drawn today and your tests are completely normal, you will receive your results only by: MyChart Message (if you have MyChart) OR A paper copy in the mail If you have any lab test that is abnormal or we need to change your treatment, we will call you to review the results.  Testing/Procedures: Your physician has requested that you have an echocardiogram. Echocardiography is a painless test that uses sound waves to create images of your heart. It provides your doctor with information about the size and shape of your heart and how well your heart's chambers and valves are working. This procedure takes approximately one hour. There are no restrictions for this procedure. Please do NOT wear cologne, perfume, aftershave, or lotions (deodorant is allowed). Please arrive 15 minutes prior to your appointment time.  Please note: We ask at that you not bring children with you during ultrasound (echo/ vascular) testing. Due to room size and safety concerns, children are not allowed in the ultrasound rooms during exams. Our front office staff cannot provide observation of children in our lobby area while testing is being conducted. An adult accompanying a patient to their appointment will only be allowed in the ultrasound room at the discretion of the ultrasound technician under special circumstances. We apologize for any inconvenience.   Follow-Up: At [facility], you and your health needs are our priority.  As part of our continuing mission to provide you with exceptional heart care, our providers are all part of one team.  This team includes your primary Cardiologist  (physician) and Advanced Practice Providers or APPs (Physician Assistants and Nurse Practitioners) who all work together to provide you with the care you need, when you need it.  Your next appointment:   2 -3 month(s)  Provider:   You may see Wilbert Bihari, MD or one of the following Advanced Practice Providers on your designated Care Team:   [place], PA-C  [place], [place] Olivia Pavy, [place]     We recommend signing up for the patient portal called MyChart.  Sign up information is provided on this After Visit Summary.  MyChart is used to connect with patients for Virtual Visits (Telemedicine).  Patients are able to view lab/test results, encounter notes, upcoming appointments, etc.  Non-urgent messages can be sent to your provider as well.   To learn more about what you can do with MyChart, go to [email or web address].   Other Instructions Thank you for choosing [facility]!

## 2023-08-18 ENCOUNTER — Telehealth: Payer: Self-pay | Admitting: Student

## 2023-08-18 NOTE — Telephone Encounter (Signed)
 I spoke with patient and suggested she speak with her PCP regarding her possible vitamin deficiency and generalized weakness. It appears from Epic that she is followed by PCP for this already. She is encouraged though as her coronary calcium score is zero.

## 2023-08-18 NOTE — Telephone Encounter (Signed)
 Patient says she has been feeling completely weak. She says she hardly has energy to do anything and assumes there may be a vitamin deficiency. Please advise.

## 2023-08-19 ENCOUNTER — Ambulatory Visit: Payer: Self-pay | Admitting: Student

## 2023-08-19 ENCOUNTER — Telehealth: Payer: Self-pay | Admitting: Cardiology

## 2023-08-19 ENCOUNTER — Other Ambulatory Visit (HOSPITAL_COMMUNITY)
Admission: RE | Admit: 2023-08-19 | Discharge: 2023-08-19 | Disposition: A | Discharge status: Home / Self Care | Source: Ambulatory Visit | Attending: Student | Admitting: Student

## 2023-08-19 DIAGNOSIS — R0609 Other forms of dyspnea: Secondary | ICD-10-CM | POA: Insufficient documentation

## 2023-08-19 LAB — BRAIN NATRIURETIC PEPTIDE: B Natriuretic Peptide: 26 pg/mL (ref 0.0–100.0)

## 2023-08-19 NOTE — Telephone Encounter (Signed)
-----   Message from Laymon CHRISTELLA Qua sent at 08/13/2023 12:07 PM EDT ----- Please let the patient know that I reviewed a copy of recent labs from her PCP. Her hemoglobin was normal at 12.2 and electrolytes were within a normal range. He also checked her thyroid  function and  this was normal as well. The only thing that was not obtained was a fluid level (BNP). If she has acute weight gain as previously noted earlier this month, would check a BNP. Continue with plans for  echocardiogram.  ----- Message ----- From: Latisha Alan MATSU Sent: 08/13/2023  11:53 AM EDT To: Laymon CHRISTELLA Qua, PA-C

## 2023-08-19 NOTE — Telephone Encounter (Signed)
 Patient is returning call for results. Please advise

## 2023-08-19 NOTE — Telephone Encounter (Signed)
 The patient has been notified of the result and verbalized understanding.  All questions (if any) were answered. DOMINIC COLUMBUS, LPN 01/16/7972 4:98 PM

## 2023-09-10 ENCOUNTER — Ambulatory Visit: Attending: Student

## 2023-09-10 DIAGNOSIS — R0609 Other forms of dyspnea: Secondary | ICD-10-CM | POA: Diagnosis not present

## 2023-09-10 LAB — ECHOCARDIOGRAM COMPLETE
AR max vel: 2.02 cm2
AV Area VTI: 2.27 cm2
AV Area mean vel: 2.1 cm2
AV Mean grad: 9 mmHg
AV Peak grad: 17.8 mmHg
Ao pk vel: 2.11 m/s
Area-P 1/2: 3.54 cm2
Calc EF: 66 %
MV VTI: 2.66 cm2
Single Plane A2C EF: 58.4 %
Single Plane A4C EF: 73.9 %

## 2023-09-11 ENCOUNTER — Ambulatory Visit: Payer: Self-pay | Admitting: Student

## 2023-09-15 ENCOUNTER — Telehealth: Payer: Self-pay | Admitting: Student

## 2023-09-15 NOTE — Telephone Encounter (Signed)
 Pt c/o swelling: STAT is pt has developed SOB within 24 hours  How much weight have you gained and in what time span? N/a  If swelling, where is the swelling located? Feet and legs (sometimes)  Are you currently taking a fluid pill? No, Pt would like to consider taking Lasix  Are you currently SOB? No  Do you have a log of your daily weights (if so, list)? N/a  Have you gained 3 pounds in a day or 5 pounds in a week? N/a  Have you traveled recently? No  Patient mentioned that she is out of the Chlorthalidone , Fluid Pill, and a elongated pill Patient was unsure of the name of the medications. Patient stated she would like to wait to get her medications until she speak with the nurse. Please advise.

## 2023-09-15 NOTE — Telephone Encounter (Signed)
    The initial phone note mentions that she was out of Chlorthalidone . Does she just need a refill of Chlorthalidone  or is she wanting a prescription for Lasix ? She weighed 201 lbs at the time of her office visit on 08/13/2023 so it is difficult to tell without a trend if this is an acute change. Her BNP (fluid level) was normal by recent labs and echocardiogram was reassuring as well. There is no specific cardiac reason that she needs to be on Lasix  but if she would feel more comfortable having a prescription to take as needed, can provide a prescription for Lasix  20 mg to take as needed for weight gain greater than 2 pounds overnight or 5 pounds in 1 week. In regards to her fatigue, I would recommend that she follow-up with her PCP as her overall cardiac workup has been very reassuring over the past few years and he may need to look for other etiologies of her fatigue. She is on Coreg  which can cause fatigue but by review of notes, she has been on this dose for several years so would be a less likely cause.  Signed, Laymon CHRISTELLA Qua, PA-C 09/15/2023, 4:09 PM Pager: [phone]

## 2023-09-15 NOTE — Telephone Encounter (Signed)
Called patient. No answer. Left message to call back.  

## 2023-09-15 NOTE — Telephone Encounter (Signed)
 Pt is asking if she needs to be put on Lasix  after having Echo. She reports that she has a 5 lb weight gain on some days. On yesterday she weighed 200 lbs and didn't weigh on today. Pt reports that she is tired and has little energy. Please advise.

## 2023-09-16 ENCOUNTER — Ambulatory Visit: Admitting: Student

## 2023-09-16 MED ORDER — CHLORTHALIDONE 25 MG PO TABS: 25.0000 mg | ORAL_TABLET | Freq: Every day | ORAL | 1 refills | Status: DC

## 2023-09-16 MED ORDER — FUROSEMIDE 20 MG PO TABS: 20.0000 mg | ORAL_TABLET | Freq: Every day | ORAL | 1 refills | Status: DC | PRN

## 2023-09-16 MED ORDER — OLMESARTAN MEDOXOMIL 40 MG PO TABS: 40.0000 mg | ORAL_TABLET | Freq: Every day | ORAL | 1 refills | Status: DC

## 2023-09-16 NOTE — Telephone Encounter (Signed)
 Pt notified of B. Strader's response and the need to start lasix  20 mg as needed and to continue Chlorthalidone .

## 2023-09-18 ENCOUNTER — Telehealth: Payer: Self-pay | Admitting: Student

## 2023-09-18 ENCOUNTER — Other Ambulatory Visit: Payer: Self-pay | Admitting: Cardiology

## 2023-09-18 ENCOUNTER — Other Ambulatory Visit (HOSPITAL_COMMUNITY): Payer: Self-pay

## 2023-09-18 MED ORDER — OLMESARTAN MEDOXOMIL 40 MG PO TABS: 40.0000 mg | ORAL_TABLET | Freq: Every day | ORAL | 0 refills | Status: DC

## 2023-09-18 MED ORDER — CHLORTHALIDONE 25 MG PO TABS: 25.0000 mg | ORAL_TABLET | Freq: Every day | ORAL | 0 refills | Status: DC

## 2023-09-18 MED ORDER — CHLORTHALIDONE 25 MG PO TABS
25.0000 mg | ORAL_TABLET | Freq: Every day | ORAL | 0 refills | Status: DC
  Filled 2023-09-18: qty 15, 15d supply, fill #0

## 2023-09-18 MED ORDER — OLMESARTAN MEDOXOMIL 40 MG PO TABS
40.0000 mg | ORAL_TABLET | Freq: Every day | ORAL | 0 refills | Status: DC
  Filled 2023-09-18: qty 15, 15d supply, fill #0

## 2023-09-18 NOTE — Telephone Encounter (Signed)
 Pt requesting a short term refill of 15 days to [facility].

## 2023-09-18 NOTE — Addendum Note (Signed)
 Addended by: Allexis Bordenave G on: 09/18/2023 11:34 AM   Modules accepted: Orders

## 2023-09-18 NOTE — Telephone Encounter (Signed)
 Pt c/o medication issue:  1. Name of Medication:   olmesartan  (BENICAR ) 40 MG tablet    chlorthalidone  (HYGROTON ) 25 MG tablet    2. How are you currently taking this medication (dosage and times per day)? As Written   3. Are you having a reaction (difficulty breathing--STAT)? No  4. What is your medication issue? Patient is calling because she has been out of these medications for a week. Patient she does not feel right and would like to know if it is from not taking the medications. Patient mentioned the refill for the medication has been sent to the pharmacy. Please advise.

## 2023-09-21 ENCOUNTER — Other Ambulatory Visit: Payer: Self-pay

## 2023-09-21 ENCOUNTER — Telehealth: Payer: Self-pay | Admitting: Student

## 2023-09-21 MED ORDER — CARVEDILOL 25 MG PO TABS: 25.0000 mg | ORAL_TABLET | Freq: Two times a day (BID) | ORAL | 1 refills | Status: DC

## 2023-09-21 MED ORDER — OLMESARTAN MEDOXOMIL 40 MG PO TABS
40.0000 mg | ORAL_TABLET | Freq: Every day | ORAL | 3 refills | Status: AC
Start: 2023-09-21 — End: ?

## 2023-09-21 MED ORDER — CHLORTHALIDONE 25 MG PO TABS: 25.0000 mg | ORAL_TABLET | Freq: Every day | ORAL | 3 refills | Status: AC

## 2023-09-21 NOTE — Telephone Encounter (Signed)
 Spoke with pt, confirmed pharmacy. Medication sent.

## 2023-09-21 NOTE — Telephone Encounter (Signed)
 Patient calling the office for samples of medication:   1.  What medication and dosage are you requesting samples for? chlorthalidone  (HYGROTON ) 25 MG tablet olmesartan  (BENICAR ) 40 MG tablet carvedilol  (COREG ) 25 MG tablet  2.  Are you currently out of this medication?   Yes, patient says she has been out of medication for 1 week.

## 2023-10-07 DIAGNOSIS — R300 Dysuria: Secondary | ICD-10-CM | POA: Diagnosis not present

## 2023-10-22 ENCOUNTER — Other Ambulatory Visit: Payer: Self-pay | Admitting: Student

## 2023-11-12 ENCOUNTER — Encounter: Payer: Self-pay | Admitting: *Deleted

## 2023-11-12 ENCOUNTER — Encounter: Payer: Self-pay | Admitting: Student

## 2023-11-12 ENCOUNTER — Ambulatory Visit: Attending: Student | Admitting: Student

## 2023-11-12 ENCOUNTER — Encounter: Payer: Self-pay | Admitting: Family Medicine

## 2023-11-12 VITALS — BP 130/74 | HR 64 | Ht 65.0 in | Wt 204.8 lb

## 2023-11-12 DIAGNOSIS — E785 Hyperlipidemia, unspecified: Secondary | ICD-10-CM

## 2023-11-12 DIAGNOSIS — R0609 Other forms of dyspnea: Secondary | ICD-10-CM | POA: Diagnosis not present

## 2023-11-12 DIAGNOSIS — I10 Essential (primary) hypertension: Secondary | ICD-10-CM | POA: Diagnosis not present

## 2023-11-12 DIAGNOSIS — Z87898 Personal history of other specified conditions: Secondary | ICD-10-CM

## 2023-11-12 MED ORDER — CARVEDILOL 12.5 MG PO TABS: 12.5000 mg | ORAL_TABLET | Freq: Two times a day (BID) | ORAL | 3 refills | Status: AC

## 2023-11-12 NOTE — Progress Notes (Signed)
 Cardiology Office Note    Date:  11/12/2023  ID:  Shuntia Exton, DOB [birth year], MRN [id number] Cardiologist: Wilbert Bihari, MD Cardiology APP:  Johnson Laymon HERO, PA-C { :  History of Present Illness:    [name] is a 73 y.o. female with past medical history of chest pain (prior abnormal NST with Coronary CT in 11/2019 showing calcium score of 0), HTN, HLD, OSA, anxiety and depression who presents to the office today for 49-month follow-up.  She was last examined by myself in 07/2023 and reported worsening dyspnea on exertion and fatigue over the past few months.  Also reported abdominal distention and a weight gain of over 8 pounds while recently at the beach. She had made dietary changes and appeared euvolemic by examination. Was recommended to obtain follow-up labs along with an echocardiogram for reassessment of EF as she was concerned about heart failure given her family history of this in her mother. She was continued on Amlodipine  10 mg daily, Coreg  25 mg twice daily, Chlorthalidone  25 mg daily, Olmesartan  40 mg daily and Simvastatin  20 mg daily.  Follow-up labs did show her BNP was normal at 26. Repeat echocardiogram was obtained in 08/2023 and showed a preserved EF of 65 to 70% with no regional wall motion abnormalities. RV function was normal and no significant valve abnormalities. She did contact the office in 09/2023 reporting intermittent weight gain and an Rx for as needed Lasix  20 mg was provided.  In talking with the patient today, she reports her respiratory status has overall been stable. No orthopnea, PND or pitting edema.  Uses her CPAP on a nightly basis. She has been taking Lasix  on a daily basis along with Chlorthalidone  as she was hoping this would help with weight loss. Did have follow-up labs with her PCP last week and says that she was told everything was normal.  No specific anginal symptoms. She does feel like she has been unable to lose  weight due to several of her medications contributing to fatigue and increased appetite. She wishes to reduce one or more of these if able.  Studies Reviewed:   EKG: EKG is not ordered today.  Coronary CTA: 11/2019 IMPRESSION: 1. Coronary calcium score of 0. This was 0 percentile for age and sex matched control.   2.  Normal coronary origin with right dominance.   3.  No evidence of CAD.  CAD RADS 0.   4.  Consider no atherosclerotic causes of chest pain.    Echocardiogram: 08/2023 IMPRESSIONS     1. Left ventricular ejection fraction, by estimation, is 65 to 70%. Left  ventricular ejection fraction by 3D volume is 67 %. The left ventricle has  normal function. The left ventricle has no regional wall motion  abnormalities. Left ventricular diastolic   parameters are indeterminate. The average left ventricular global  longitudinal strain is -20.4 %. The global longitudinal strain is normal.   2. Right ventricular systolic function is normal. The right ventricular  size is normal. There is normal pulmonary artery systolic pressure. The  estimated right ventricular systolic pressure is 35.5 mmHg.   3. The mitral valve is grossly normal. Trivial mitral valve  regurgitation.   4. The aortic valve is tricuspid. Aortic valve regurgitation is not  visualized. Aortic valve sclerosis/calcification is present, without any  evidence of aortic stenosis. Aortic valve mean gradient measures 9.0 mmHg.   5. The inferior vena cava is normal in size with greater than 50%  respiratory  variability, suggesting right atrial pressure of 3 mmHg.   Comparison(s): A prior study was performed on 05/20/2022. Prior images  reviewed side by side. LVEF 65-70%, indeterminate diastolic function.  Upper normal estimated RVSP.   Physical Exam:   VS:  BP 130/74 (BP Location: Right Arm, Cuff Size: Normal)   Pulse 64   Ht 5' 5 (1.651 m)   Wt 204 lb 12.8 oz (92.9 kg)   LMP  (LMP Unknown)   BMI 34.08 kg/m     Wt Readings from Last 3 Encounters:  11/12/23 204 lb 12.8 oz (92.9 kg)  08/13/23 201 lb (91.2 kg)  02/19/23 215 lb (97.5 kg)     GEN: Well nourished, well developed female appearing in no acute distress NECK: No JVD; No carotid bruits CARDIAC: RRR, no murmurs, rubs, gallops RESPIRATORY:  Clear to auscultation without rales, wheezing or rhonchi  ABDOMEN: Appears non-distended. No obvious abdominal masses. EXTREMITIES: No clubbing or cyanosis. No pitting edema.  Distal pedal pulses are 2+ bilaterally.   Assessment and Plan:   1. DOE (dyspnea on exertion) - She has undergone a very thorough cardiac workup over the past several years with Coronary CTA in 2021 showing no evidence of CAD and recent echocardiogram showed a preserved EF as outlined above with no wall motion abnormalities.  - She has been taking Lasix  daily along with being on Chlorthalidone  and we reviewed that Lasix  should only be used as needed for acute weight gain greater than 2 pounds overnight or 5 pounds in 1 week and should not be taken daily while on Chlorthalidone . She did have labs with her PCP last week and we will request a copy of these.   - Was encouraged to continue to reduce sodium intake. No indication for further cardiac testing at this time.  2. History of chest pain - Coronary CTA in 11/2019 showed a coronary calcium score of 0 with no evidence of CAD  No recent anginal symptoms.  Continue with risk factor modification.  3. Hyperlipidemia LDL goal <70 - Followed by PCP. LDL was 71 when checked in 07/2023. Continue current medical therapy with Simvastatin  20 mg daily.  4. Essential hypertension - BP is at 130/74 during today's visit but she reports SBP has been in the 110's at times. She does feel that medications are causing fatigue which is in turn leading to weight gain. Reviewed options with the patient and will reduce Coreg  to 12.5 mg twice daily to see if this helps with symptoms. If no change, can  resume at current dose. Continue Chlorthalidone  25 mg daily and Olmesartan  40 mg daily. Provided with a BP log and encouraged to return this in 1 month.   Signed, Laymon CHRISTELLA Qua, PA-C

## 2023-11-12 NOTE — Patient Instructions (Addendum)
 Medication Instructions:   Only take LASIX  as needed for weight gain greater than 2 pounds overnight or greater than 5 pounds in one week.   Reduce Coreg  (Carvedilol ) to 12.5mg  twice daily.  Return BP log in 1 month.    *If you need a refill on your cardiac medications before your next appointment, please call your pharmacy*  Follow-Up: At [facility], you and your health needs are our priority.  As part of our continuing mission to provide you with exceptional heart care, our providers are all part of one team.  This team includes your primary Cardiologist (physician) and Advanced Practice Providers or APPs (Physician Assistants and Nurse Practitioners) who all work together to provide you with the care you need, when you need it.  Your next appointment:   1 year(s)  Provider:   You may see Wilbert Bihari, MD or one of the following Advanced Practice Providers on your designated Care Team:   Jaecob [place], PA-C  [place], [place] Olivia Pavy, [place]     We recommend signing up for the patient portal called MyChart.  Sign up information is provided on this After Visit Summary.  MyChart is used to connect with patients for Virtual Visits (Telemedicine).  Patients are able to view lab/test results, encounter notes, upcoming appointments, etc.  Non-urgent messages can be sent to your provider as well.   To learn more about what you can do with MyChart, go to [email or web address].   Other Instructions

## 2023-11-13 ENCOUNTER — Telehealth: Payer: Self-pay | Admitting: Cardiology

## 2023-11-13 NOTE — Telephone Encounter (Signed)
I will FYI B.Strader,PA-C 

## 2023-11-13 NOTE — Telephone Encounter (Signed)
 Pt c/o medication issue:  1. Name of Medication:   furosemide  (LASIX ) 20 MG tablet   2. How are you currently taking this medication (dosage and times per day)?     3. Are you having a reaction (difficulty breathing--STAT)?   4. What is your medication issue?   Patient wanted to let B. Strader, PA know she had not been taking this medication as prescribed but taking 1 tablet daily.  Patient stated she has a full bottle left and will only take as needed going forward.

## 2024-01-19 ENCOUNTER — Telehealth: Payer: Self-pay | Admitting: Student

## 2024-01-19 NOTE — Telephone Encounter (Signed)
 Returned call to pt. No answer. Left msg to call back.

## 2024-01-19 NOTE — Telephone Encounter (Signed)
 Pt c/o Shortness Of Breath: STAT if SOB developed within the last 24 hours or pt is noticeably SOB on the phone  1. Are you currently SOB (can you hear that pt is SOB on the phone)? Yes   2. How long have you been experiencing SOB? Unknown   3. Are you SOB when sitting or when up moving around? Sitting and moving around   4. Are you currently experiencing any other symptoms? weakness   Call was disconnected transferred to triage

## 2024-01-20 NOTE — Telephone Encounter (Signed)
 Spoke to patient who stated that she experienced SOB yesterday morning, but believes she messed her medication up. Pt believes that she put two tablets of her Carvedilol  in her morning medication pill pack and one tablet in the evening pill pack and this caused her to be short of breath. Pt stated that she felt normal today with no sob, cp, etc.

## 2024-01-20 NOTE — Telephone Encounter (Signed)
 Patient was returning call. Please advise ?

## 2024-01-20 NOTE — Telephone Encounter (Signed)
 Left a message for patient to call office back regarding SOB
